# Patient Record
Sex: Male | Born: 1945
Health system: Southern US, Community
[De-identification: ages and names within clinical notes are randomized; demographics above are authoritative.]

## PROBLEM LIST (undated history)

## (undated) DIAGNOSIS — H9190 Unspecified hearing loss, unspecified ear: Secondary | ICD-10-CM

## (undated) DIAGNOSIS — N529 Male erectile dysfunction, unspecified: Secondary | ICD-10-CM

## (undated) DIAGNOSIS — M1990 Unspecified osteoarthritis, unspecified site: Secondary | ICD-10-CM

## (undated) DIAGNOSIS — E785 Hyperlipidemia, unspecified: Secondary | ICD-10-CM

## (undated) DIAGNOSIS — K219 Gastro-esophageal reflux disease without esophagitis: Secondary | ICD-10-CM

## (undated) DIAGNOSIS — J309 Allergic rhinitis, unspecified: Secondary | ICD-10-CM

## (undated) DIAGNOSIS — T464X5A Adverse effect of angiotensin-converting-enzyme inhibitors, initial encounter: Secondary | ICD-10-CM

## (undated) DIAGNOSIS — M722 Plantar fascial fibromatosis: Secondary | ICD-10-CM

## (undated) DIAGNOSIS — C801 Malignant (primary) neoplasm, unspecified: Secondary | ICD-10-CM

## (undated) DIAGNOSIS — R7303 Prediabetes: Secondary | ICD-10-CM

## (undated) DIAGNOSIS — I10 Essential (primary) hypertension: Secondary | ICD-10-CM

## (undated) DIAGNOSIS — C61 Malignant neoplasm of prostate: Secondary | ICD-10-CM

## (undated) DIAGNOSIS — K5900 Constipation, unspecified: Secondary | ICD-10-CM

## (undated) DIAGNOSIS — C3490 Malignant neoplasm of unspecified part of unspecified bronchus or lung: Secondary | ICD-10-CM

## (undated) DIAGNOSIS — R05 Cough: Secondary | ICD-10-CM

## (undated) DIAGNOSIS — I2510 Atherosclerotic heart disease of native coronary artery without angina pectoris: Secondary | ICD-10-CM

## (undated) HISTORY — DX: Adverse effect of angiotensin-converting-enzyme inhibitors, initial encounter: T46.4X5A

## (undated) HISTORY — DX: Plantar fascial fibromatosis: M72.2

## (undated) HISTORY — DX: Unspecified hearing loss, unspecified ear: H91.90

## (undated) HISTORY — PX: COLONOSCOPY: SHX174

## (undated) HISTORY — DX: Gastro-esophageal reflux disease without esophagitis: K21.9

## (undated) HISTORY — DX: Cough: R05

## (undated) HISTORY — DX: Hyperlipidemia, unspecified: E78.5

## (undated) HISTORY — DX: Allergic rhinitis, unspecified: J30.9

## (undated) HISTORY — PX: KNEE ARTHROSCOPY: SHX127

## (undated) HISTORY — DX: Male erectile dysfunction, unspecified: N52.9

---

## 1998-07-09 ENCOUNTER — Ambulatory Visit (HOSPITAL_COMMUNITY): Admission: RE | Admit: 1998-07-09 | Discharge: 1998-07-09 | Payer: Self-pay | Admitting: *Deleted

## 2001-01-23 ENCOUNTER — Emergency Department (HOSPITAL_COMMUNITY): Admission: EM | Admit: 2001-01-23 | Discharge: 2001-01-23 | Payer: Self-pay | Admitting: Emergency Medicine

## 2001-03-23 ENCOUNTER — Encounter: Payer: Self-pay | Admitting: Emergency Medicine

## 2001-03-23 ENCOUNTER — Inpatient Hospital Stay (HOSPITAL_COMMUNITY): Admission: AD | Admit: 2001-03-23 | Discharge: 2001-03-25 | Payer: Self-pay | Admitting: Neurology

## 2001-03-23 ENCOUNTER — Encounter: Payer: Self-pay | Admitting: Pediatrics

## 2001-03-24 ENCOUNTER — Encounter: Payer: Self-pay | Admitting: Neurology

## 2001-10-23 ENCOUNTER — Ambulatory Visit (HOSPITAL_COMMUNITY): Admission: RE | Admit: 2001-10-23 | Discharge: 2001-10-23 | Payer: Self-pay | Admitting: *Deleted

## 2001-10-23 ENCOUNTER — Encounter (INDEPENDENT_AMBULATORY_CARE_PROVIDER_SITE_OTHER): Payer: Self-pay | Admitting: *Deleted

## 2003-12-08 ENCOUNTER — Emergency Department (HOSPITAL_COMMUNITY): Admission: EM | Admit: 2003-12-08 | Discharge: 2003-12-08 | Payer: Self-pay | Admitting: Emergency Medicine

## 2004-12-08 ENCOUNTER — Ambulatory Visit: Payer: Self-pay | Admitting: Family Medicine

## 2005-04-05 ENCOUNTER — Ambulatory Visit: Payer: Self-pay | Admitting: Family Medicine

## 2005-05-12 ENCOUNTER — Ambulatory Visit: Payer: Self-pay | Admitting: Family Medicine

## 2005-05-25 ENCOUNTER — Ambulatory Visit: Payer: Self-pay | Admitting: Family Medicine

## 2005-09-27 ENCOUNTER — Ambulatory Visit: Payer: Self-pay | Admitting: Family Medicine

## 2007-01-30 DIAGNOSIS — Z8679 Personal history of other diseases of the circulatory system: Secondary | ICD-10-CM | POA: Insufficient documentation

## 2007-01-30 DIAGNOSIS — J309 Allergic rhinitis, unspecified: Secondary | ICD-10-CM

## 2007-01-30 HISTORY — DX: Allergic rhinitis, unspecified: J30.9

## 2007-08-09 ENCOUNTER — Telehealth (INDEPENDENT_AMBULATORY_CARE_PROVIDER_SITE_OTHER): Payer: Self-pay

## 2007-09-13 ENCOUNTER — Ambulatory Visit: Payer: Self-pay | Admitting: Family Medicine

## 2007-09-13 LAB — CONVERTED CEMR LAB
ALT: 25 units/L (ref 0–53)
AST: 17 units/L (ref 0–37)
Albumin: 3.8 g/dL (ref 3.5–5.2)
Alkaline Phosphatase: 49 units/L (ref 39–117)
BUN: 10 mg/dL (ref 6–23)
Basophils Absolute: 0 10*3/uL (ref 0.0–0.1)
Basophils Relative: 0.2 % (ref 0.0–1.0)
Bilirubin Urine: NEGATIVE
Bilirubin, Direct: 0.1 mg/dL (ref 0.0–0.3)
CO2: 30 meq/L (ref 19–32)
Calcium: 9.3 mg/dL (ref 8.4–10.5)
Chloride: 106 meq/L (ref 96–112)
Cholesterol: 160 mg/dL (ref 0–200)
Creatinine, Ser: 1.1 mg/dL (ref 0.4–1.5)
Eosinophils Absolute: 0.8 10*3/uL — ABNORMAL HIGH (ref 0.0–0.7)
Eosinophils Relative: 9.6 % — ABNORMAL HIGH (ref 0.0–5.0)
GFR calc Af Amer: 87 mL/min
GFR calc non Af Amer: 72 mL/min
Glucose, Bld: 76 mg/dL (ref 70–99)
Glucose, Urine, Semiquant: NEGATIVE
HCT: 47.1 % (ref 39.0–52.0)
HDL: 39.1 mg/dL (ref >39.0)
Hemoglobin: 15.7 g/dL (ref 13.0–17.0)
Ketones, urine, test strip: NEGATIVE
LDL Cholesterol: 104 mg/dL — ABNORMAL HIGH (ref 0–99)
Lymphocytes Relative: 30 % (ref 12.0–46.0)
MCHC: 33.3 g/dL (ref 30.0–36.0)
MCV: 84.4 fL (ref 78.0–100.0)
Monocytes Absolute: 0.7 10*3/uL (ref 0.1–1.0)
Monocytes Relative: 9.2 % (ref 3.0–12.0)
Neutro Abs: 4 10*3/uL (ref 1.4–7.7)
Neutrophils Relative %: 51 % (ref 43.0–77.0)
Nitrite: NEGATIVE
PSA: 2.49 ng/mL (ref 0.10–4.00)
Platelets: 243 10*3/uL (ref 150–400)
Potassium: 4.4 meq/L (ref 3.5–5.1)
Protein, U semiquant: NEGATIVE
RBC: 5.58 M/uL (ref 4.22–5.81)
RDW: 13.2 % (ref 11.5–14.6)
Sodium: 140 meq/L (ref 135–145)
Specific Gravity, Urine: 1.02
TSH: 1 microintl units/mL (ref 0.35–5.50)
Total Bilirubin: 0.9 mg/dL (ref 0.3–1.2)
Total CHOL/HDL Ratio: 4.1
Total Protein: 6.8 g/dL (ref 6.0–8.3)
Triglycerides: 87 mg/dL (ref 0–149)
Urobilinogen, UA: 0.2
VLDL: 17 mg/dL (ref 0–40)
WBC Urine, dipstick: NEGATIVE
WBC: 7.9 10*3/uL (ref 4.5–10.5)
pH: 5.5

## 2007-09-26 ENCOUNTER — Ambulatory Visit: Payer: Self-pay | Admitting: Family Medicine

## 2007-09-26 DIAGNOSIS — E785 Hyperlipidemia, unspecified: Secondary | ICD-10-CM

## 2007-09-26 DIAGNOSIS — K219 Gastro-esophageal reflux disease without esophagitis: Secondary | ICD-10-CM

## 2007-09-26 HISTORY — DX: Gastro-esophageal reflux disease without esophagitis: K21.9

## 2007-09-26 HISTORY — DX: Hyperlipidemia, unspecified: E78.5

## 2007-10-09 ENCOUNTER — Telehealth: Payer: Self-pay | Admitting: Family Medicine

## 2007-10-10 ENCOUNTER — Encounter: Payer: Self-pay | Admitting: Family Medicine

## 2007-11-27 ENCOUNTER — Telehealth: Payer: Self-pay | Admitting: Family Medicine

## 2008-09-25 ENCOUNTER — Ambulatory Visit: Payer: Self-pay | Admitting: Family Medicine

## 2008-09-25 LAB — CONVERTED CEMR LAB
AST: 23 units/L (ref 0–37)
Albumin: 3.9 g/dL (ref 3.5–5.2)
BUN: 11 mg/dL (ref 6–23)
Basophils Absolute: 0 10*3/uL (ref 0.0–0.1)
Bilirubin Urine: NEGATIVE
Blood in Urine, dipstick: NEGATIVE
CO2: 30 meq/L (ref 19–32)
Chloride: 109 meq/L (ref 96–112)
Cholesterol: 145 mg/dL (ref 0–200)
Glucose, Bld: 90 mg/dL (ref 70–99)
Glucose, Urine, Semiquant: NEGATIVE
HCT: 46.7 % (ref 39.0–52.0)
Hemoglobin: 15.8 g/dL (ref 13.0–17.0)
Ketones, urine, test strip: NEGATIVE
Lymphs Abs: 2.1 10*3/uL (ref 0.7–4.0)
MCHC: 33.8 g/dL (ref 30.0–36.0)
MCV: 83.7 fL (ref 78.0–100.0)
Monocytes Absolute: 0.6 10*3/uL (ref 0.1–1.0)
Monocytes Relative: 8.5 % (ref 3.0–12.0)
Neutro Abs: 3.7 10*3/uL (ref 1.4–7.7)
Nitrite: NEGATIVE
PSA: 3.46 ng/mL (ref 0.10–4.00)
Platelets: 211 10*3/uL (ref 150.0–400.0)
Potassium: 4.4 meq/L (ref 3.5–5.1)
Protein, U semiquant: NEGATIVE
RDW: 12.5 % (ref 11.5–14.6)
Sodium: 143 meq/L (ref 135–145)
Specific Gravity, Urine: <1.005
TSH: 1.16 microintl units/mL (ref 0.35–5.50)
Total Bilirubin: 1.1 mg/dL (ref 0.3–1.2)
Urobilinogen, UA: 0.2
VLDL: 12.4 mg/dL (ref 0.0–40.0)
WBC Urine, dipstick: NEGATIVE
pH: 6

## 2008-10-03 ENCOUNTER — Ambulatory Visit: Payer: Self-pay | Admitting: Family Medicine

## 2009-03-12 ENCOUNTER — Ambulatory Visit: Payer: Self-pay | Admitting: Family Medicine

## 2009-06-16 ENCOUNTER — Telehealth: Payer: Self-pay | Admitting: Family Medicine

## 2009-06-16 DIAGNOSIS — H9190 Unspecified hearing loss, unspecified ear: Secondary | ICD-10-CM | POA: Insufficient documentation

## 2009-06-16 HISTORY — DX: Unspecified hearing loss, unspecified ear: H91.90

## 2009-07-08 ENCOUNTER — Ambulatory Visit: Payer: Self-pay | Admitting: Family Medicine

## 2009-07-08 DIAGNOSIS — M722 Plantar fascial fibromatosis: Secondary | ICD-10-CM

## 2009-07-08 HISTORY — DX: Plantar fascial fibromatosis: M72.2

## 2010-05-29 ENCOUNTER — Telehealth: Payer: Self-pay | Admitting: Family Medicine

## 2010-06-23 NOTE — Progress Notes (Signed)
Summary: needs referral  Phone Note Call from Patient Call back at [phone]   Caller: Patient- live call Summary of Call: pt is to have a hearing test at [facility] in [facility]. His insurance requires a referral. Please fax to [phone]. Appt is this Thursday at 3 pm. Initial call taken by: Warnell Forester,  June 16, 2009 10:30 AM  Follow-up for Phone Call        ok Follow-up by: Roderick Pee MD,  June 16, 2009 11:26 AM  New Problems: UNSPECIFIED HEARING LOSS (ICD-389.9)   New Problems: UNSPECIFIED HEARING LOSS (ICD-389.9)

## 2010-06-23 NOTE — Assessment & Plan Note (Signed)
Summary: consult re: foot pain/cjr   Vital Signs:  Patient profile:   65 year old male Height:      66 inches Weight:      199 pounds BMI:     32.24 Temp:     98.6 degrees F oral BP sitting:   120 / 86  (left arm) Cuff size:   regular  Vitals Entered By: Kern Reap CMA Duncan Dull) (July 08, 2009 2:47 PM)  Reason for Visit feet pain  History of Present Illness: Carl Harrell  is a 65 year old male who comes in with a 41-month history of pain in both heels.  Last summer.  He was walking 13-mile walk.  After that.  He does pain in both heels.  It will go away.  He feels is worse in the morning, when he gets out of bed.  He continues to go barefooted a lot at home.  No history of trauma  Allergies: No Known Drug Allergies  Past History:  Past medical, surgical, family and social histories (including risk factors) reviewed for relevance to current acute and chronic problems.  Past Medical History: Reviewed history from 09/26/2007 and no changes required. PVD ED Allergic rhinitis High Cholesterol Transient ischemic attack, hx of left knee scoped 2008 .cartilage GERD Hyperlipidemia  Past Surgical History: Reviewed history from 01/30/2007 and no changes required. Colonoscopy x2  Family History: Reviewed history from 01/30/2007 and no changes required. Family History Breast cancer 1st degree relative <50 Family History High cholesterol Family History of Cardiovascular disorder Family History of Respiratory disease  Social History: Reviewed history from 01/30/2007 and no changes required. Occupation: Married Former Smoker Alcohol use-yes Drug use-no  Review of Systems      See HPI  Physical Exam  Msk:  both feet are normal except for some tenderness in both heels, consistent with plantar fasciitis   Impression & Recommendations:  Problem # 1:  PLANTAR FASCIITIS, BILATERAL (ICD-728.71) Assessment New  Complete Medication List: 1)  Zocor 40 Mg Tabs (Simvastatin)  .... Once daily 2)  Nexium 40 Mg Cpdr (Esomeprazole magnesium) .... Two times a day 3)  Allegra 180 Mg Tabs (Fexofenadine hcl) .... Take 1 tablet by mouth every morning 4)  Prednisone 10 Mg Tabs (Prednisone) .... Taper as follows: 4-4-4-3-3-3-2-2-1-1  Patient Instructions: 1)  begin Motrin 600 mg twice a day with food.  Do the stretching exercises in the morning before you get out of bed. 2)  Do not go barefooted, consider the special shoes...sanita......... if you don't see any improvement after two to 4 weeks.  Call we  will  set u  up to see Jeanene Erb, physical therapist

## 2010-06-25 NOTE — Progress Notes (Signed)
Summary: wants referral  Phone Note Call from Patient Call back at [phone]   Caller: Patient---live call Summary of Call: heels are not better. he wants to go ahead and see Jeanene Erb, specialist. please order. Initial call taken by: Warnell Forester,  May 29, 2010 9:11 AM  Follow-up for Phone Call        ok ........connie dupree....Marland Kitchen physical therapist evaluation and treatment of plantar fasciitis Follow-up by: Roderick Pee MD,  May 29, 2010 5:38 PM

## 2010-07-08 ENCOUNTER — Other Ambulatory Visit: Payer: Self-pay | Admitting: Family Medicine

## 2010-08-18 ENCOUNTER — Encounter: Payer: Self-pay | Admitting: Family Medicine

## 2010-08-18 ENCOUNTER — Ambulatory Visit (INDEPENDENT_AMBULATORY_CARE_PROVIDER_SITE_OTHER): Payer: Medicare Other | Admitting: Family Medicine

## 2010-08-18 DIAGNOSIS — N401 Enlarged prostate with lower urinary tract symptoms: Secondary | ICD-10-CM

## 2010-08-18 DIAGNOSIS — Z8679 Personal history of other diseases of the circulatory system: Secondary | ICD-10-CM

## 2010-08-18 DIAGNOSIS — N138 Other obstructive and reflux uropathy: Secondary | ICD-10-CM

## 2010-08-18 DIAGNOSIS — K219 Gastro-esophageal reflux disease without esophagitis: Secondary | ICD-10-CM

## 2010-08-18 DIAGNOSIS — E785 Hyperlipidemia, unspecified: Secondary | ICD-10-CM

## 2010-08-18 LAB — PSA: PSA: 3.47 ng/mL (ref 0.10–4.00)

## 2010-08-18 LAB — LIPID PANEL
Cholesterol: 154 mg/dL (ref 0–200)
Triglycerides: 77 mg/dL (ref 0.0–149.0)

## 2010-08-18 LAB — HEPATIC FUNCTION PANEL
ALT: 30 U/L (ref 0–53)
AST: 23 U/L (ref 0–37)
Albumin: 4 g/dL (ref 3.5–5.2)
Total Bilirubin: 0.8 mg/dL (ref 0.3–1.2)
Total Protein: 6.5 g/dL (ref 6.0–8.3)

## 2010-08-18 LAB — CBC WITH DIFFERENTIAL/PLATELET
Basophils Relative: 0.4 % (ref 0.0–3.0)
Eosinophils Relative: 5 % (ref 0.0–5.0)
HCT: 43.4 % (ref 39.0–52.0)
Hemoglobin: 14.6 g/dL (ref 13.0–17.0)
Lymphs Abs: 2.4 10*3/uL (ref 0.7–4.0)
MCV: 84.3 fl (ref 78.0–100.0)
Monocytes Absolute: 0.7 10*3/uL (ref 0.1–1.0)
Neutro Abs: 5 10*3/uL (ref 1.4–7.7)
Platelets: 233 10*3/uL (ref 150.0–400.0)
WBC: 8.6 10*3/uL (ref 4.5–10.5)

## 2010-08-18 LAB — POCT URINALYSIS DIPSTICK
Bilirubin, UA: NEGATIVE
Blood, UA: NEGATIVE
Glucose, UA: NEGATIVE
Ketones, UA: NEGATIVE
Spec Grav, UA: 1.02

## 2010-08-18 LAB — BASIC METABOLIC PANEL
BUN: 14 mg/dL (ref 6–23)
Chloride: 107 mEq/L (ref 96–112)
Potassium: 4.5 mEq/L (ref 3.5–5.1)
Sodium: 142 mEq/L (ref 135–145)

## 2010-08-18 LAB — TSH: TSH: 1.35 u[IU]/mL (ref 0.35–5.50)

## 2010-08-18 MED ORDER — ESOMEPRAZOLE MAGNESIUM 40 MG PO CPDR
40.0000 mg | DELAYED_RELEASE_CAPSULE | Freq: Two times a day (BID) | ORAL | Status: DC
Start: 2010-08-18 — End: 2012-02-01

## 2010-08-18 MED ORDER — SIMVASTATIN 40 MG PO TABS: 40.0000 mg | ORAL_TABLET | Freq: Every day | ORAL | Status: DC

## 2010-08-18 NOTE — Patient Instructions (Signed)
Continue your current medications.  Remember to avoid anything that has pseudoephedrine or caffeine in it.  Consider the over-the-counter Prilosec 40 mg twice daily instead of the Nexium.  Follow-up in one year sooner if any problems.  Check with your insurance company to see what the date for the shingles.  Vaccine and if you would like to vaccine.  Call our main number and leave a voicemail with Fleet Contras my nurse

## 2010-08-18 NOTE — Progress Notes (Signed)
  Subjective:    Patient ID: Carl Harrell, male    DOB: 05/09/1946, 65 y.o.   MRN: [id number]  HPI Carl Harrell is a 65 year old, married man nonsmoker, who comes in today for evaluation of allergic rhinitis and hyperlipidemia.  He takes plain Allegra for allergic rhinitis and a daily basis.  He also takes Nexium 40 mg b.i.d. Because of reflux esophagitis.  Last colonoscopy two years ago, normal.  He also takes Zocor 40 mg nightly lipid panel today.  He does have BPH with outlet obstruction and some mild erectile dysfunction.   Review of Systems  Constitutional: Negative.   HENT: Negative.   Eyes: Negative.   Respiratory: Negative.   Cardiovascular: Negative.   Gastrointestinal: Negative.   Genitourinary: Negative.   Musculoskeletal: Negative.   Skin: Negative.   Neurological: Negative.   Hematological: Negative.   Psychiatric/Behavioral: Negative.        Objective:   Physical Exam  Constitutional: He is oriented to person, place, and time. He appears well-developed and well-nourished.  HENT:  Head: Normocephalic and atraumatic.  Right Ear: External ear normal.  Left Ear: External ear normal.  Nose: Nose normal.  Mouth/Throat: Oropharynx is clear and moist.  Eyes: Conjunctivae and EOM are normal. Pupils are equal, round, and reactive to light.  Neck: Normal range of motion. Neck supple. No JVD present. No tracheal deviation present. No thyromegaly present.  Cardiovascular: Normal rate, regular rhythm, normal heart sounds and intact distal pulses.  Exam reveals no gallop and no friction rub.   No murmur heard. Pulmonary/Chest: Effort normal and breath sounds normal. No stridor. No respiratory distress. He has no wheezes. He has no rales. He exhibits no tenderness.  Abdominal: Soft. Bowel sounds are normal. He exhibits no distension and no mass. There is no tenderness. There is no rebound and no guarding.  Genitourinary: Rectum normal and penis normal. Guaiac negative stool. No  penile tenderness.       2+ and symmetrical.  BPH  Musculoskeletal: Normal range of motion. He exhibits no edema and no tenderness.  Lymphadenopathy:    He has no cervical adenopathy.  Neurological: He is alert and oriented to person, place, and time. He has normal reflexes. No cranial nerve deficit. He exhibits normal muscle tone.  Skin: Skin is warm and dry. No rash noted. No erythema. No pallor.  Psychiatric: He has a normal mood and affect. His behavior is normal. Judgment and thought content normal.          Assessment & Plan:  Allergic rhinitis....... Continue Allegra one daily.  Reflux esophagitis.......... Continue Nexium 40 mg b.i.d.  Hyperlipidemia........... Continue Zocor 40 mg nightly  BPH with outlet obstruction.............Marland Kitchen Avoid caffeine.  Erectile dysfunction..........Marland Kitchen Declines medication

## 2010-10-09 NOTE — H&P (Signed)
[facility]  Patient:    Carl Harrell Visit Number: [id number] MRN: [id number]          Service Type: MED Location: 3000 [id number] 01 Attending Physician:  Erich Montane Dictated by:   Deanna Artis. Sharene Skeans, M.D. Admit Date:  03/23/2001   CC:         Carl Harrell, M.D.   History and Physical  DATE OF BIRTH:  [birth year]  CHIEF COMPLAINT:  Right leg gave way.  HISTORY OF PRESENT ILLNESS:  Carl Harrell is a 65 year old married, right-handed, Caucasian gentleman from [place], [place].  He had onset of blurred vision in his left eye yesterday afternoon which persists and may be somewhat better.  The patient has been ill for a week with a flulike illness involving cough, malaise, anorexia, without significant fever.  [facility] and placed on a Z-Pak.  He still went to work. While driving there around 4 this morning he felt light-headed.  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  He was unable to move it at all and collapsed to the loading dock.  The patients symptoms of right leg weakness lasted for about 20 minutes.  The patient has had a three-hour left temporal headache last night.  He denies other pain or numbness in association with this.  He has not had diplopia, dysphagia, true loss of consciousness, or incontinence.  He thought that his speech was slurred this morning, but this was not observed by his wife.  REVIEW OF SYSTEMS:  Remarkable for hyperlipidemia.  He has increased blood pressure in the emergency room which appears to be new.  No history of diabetes mellitus or personal history of heart disease.  The patient has a lump in his back which looks like a prominent right scapula.  This is accented by a small area of atrophy superior to that.  The patient had a prior history of right sciatica which resolved and also  bursitis in his right shoulder which resolved.  Review of systems is otherwise negative.  PAST MEDICAL HISTORY:  Negative except as noted in the review of systems.  PAST SURGICAL HISTORY:  The patient had removal of colonic polyps via colonoscopy.  No cancer was found.  MEDICATIONS: 1. Lipitor 20 mg per day. 2. Z-Pak.  ALLERGIES:  None.  FAMILY HISTORY:  Positive for atherosclerotic cardiovascular disease. According to his wife, only one male in his family has lived past age 29.  His father died in his 68s of an acute myocardial infarction.  The patients mother is in her 40s.  She has breast cancer and survives in complete remission.  He has two sisters who are alive and well.  SOCIAL HISTORY:  The patient works delivering bread.  He has a remote history of smoking that was infrequent.  He does not use alcohol except on rare occasions.  PHYSICAL EXAMINATION:  VITAL SIGNS:  On examination today, blood pressure 138/84 (it has been as high as 164/93 witnessed by me).  Pulse 78 and regular, respirations 16, temperature 98.5.  HEENT:  No signs of infection in the tympanic membranes, oropharynx, nasal pharynx.  NECK:  Supple neck, full range of motion.  No cranial or cervical bruits.  LUNGS:  Clear to auscultation.  HEART:  No murmurs.  Pulses normal.  ABDOMEN:  Soft.  Bowel sounds normal.  No hepatosplenomegaly.  EXTREMITIES:  Negative with no edema or cyanosis.  NEUROLOGIC:  The patient shows a left pupillary defect and some venous engorgement in the left retina.  No papilledema or hemorrhage.  Visual acuity 20/40 in both eyes with glasses.  The patient had symmetric facial strength, midline tongue and uvula.  Mental Status:  The patient was awake, alert, attentive, appropriate, no dysphagia or dyspraxia.  Motor examination:  Normal strength, tone, and mass.  Good fine motor movements.  No pronator drift.  He did not show winging of his right scapula.  Sensory  examination:  Stocking neuropathy to cold to his ankles, otherwise distally, he had normal vibration and proprioception.  He had good stereognosis and a 4 mm two point discrimination.  Cerebellar examination:  Finger-to-nose rapid fine motor movements were normal.  Gait was steady and neuro based.  He can walk on his heels and toes. Deep tendon reflexes were brisk at the knees, normal at the ankles, diminished to absent in the upper extremities.  He had bilateral flexor plantar responses.  IMPRESSION: 1. Left afferent pupillary defect. 2. Transient right leg weakness.  This suggests anterior circulation of    vaso-occlusive disease. 3. Presyncope, etiology unknown, though I think the patient had been ill. 4. Risk factors for stroke include dyslipidemia, which is being treated, mild    hypertension, and a family history of atherosclerotic cardiovascular    disease.  PLAN:  Will hold heparin for now because the patient is stable in all aspects. We will work and evaluate him with MRI scan of the brain, MRA, intracranial, extracranial, and 2-D echocardiogram.  He will also have a workup for treatable causes of stroke in the young.  Will treat him with enteric-coated aspirin and continue with Lipitor and any other medications that seem appropriate.  If a bed opens up in the Stroke Center at [facility], he will be transferred to [facility] and placed on the stroke team, otherwise, he will be admitted to [facility] under my care. Dictated by:   Deanna Artis. Sharene Skeans, M.D. Attending Physician:  Erich Montane DD:  03/23/01 TD:  03/23/01 Job: [id number]

## 2010-10-09 NOTE — Discharge Summary (Signed)
[place]  Patient:    Carl Harrell, Carl Harrell Visit Number: [id number] MRN: [id number]          Service Type: MED Location: 3000 3039 01 Attending Physician:  Erich Montane Dictated by:   Genene Churn. Love, M.D. Admit Date:  03/23/2001 Discharge Date: 03/25/2001   CC:         [facility] on [address]  Peter M. [place], M.D.   Discharge Summary  STREET ADDRESS:  [address], [place], [place] [address].  DATE OF BIRTH:  02/24/1946  Carl Harrell is a 65 year old right-handed white married male who lives near [place], [place], and was admitted from [facility] Emergency Room in transfer to [facility] Stroke Unit on 03/23/01, for evaluation of a history of viral illness, visual loss in his left eye, and transient right leg weakness.  HISTORY OF PRESENT ILLNESS:  [name] had been [facility] [address] in [place] with a one week history of flu-like illness characterized by cough, malaise, and anorexia.  He was placed on a Z-Pak antibiotic medications for his suspected viral illness.  He still went to work.  He noted a history of left eye visual loss on 03/22/01, and awoke in the middle of the night with continued visual loss, though it had improved. 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  That night prior to that time he had some left temporal headache.  There was no other history of symptoms of chest pain, double vision, swallowing problems, slurred speech, blackout spells, or seizures.  He has no known history of drug or alcohol abuse.  He has a remote history of cigarette use.  He has a positive history of hypercholesterolemia. There is no known history of diabetes, heart disease, or peripheral vascular disease.  PAST MEDICAL  HISTORY: 1. Prior history of right leg sciatica which resolved. 2. History of bursitis in his right shoulder.  PAST SURGICAL HISTORY:  Removal of polyps via colonoscopy without evidence of cancer noted.  MEDICATIONS: 1. Lipitor 20 mg q.d. 2. Z-Pak on admission.  In the past he had been taking aspirin, but when he began Lipitor he discontinued the aspirin.  ALLERGIES:  No known drug allergies.  FAMILY HISTORY:  Positive in that his father died in his 67s with a myocardial infarction.  His mother was in her 20s, breast cancer survivor.  He has two sisters who are alive and well.  SOCIAL HISTORY:  He rarely drank alcohol.  He works by delivering bread.  He has a remote smoking history.  PHYSICAL EXAMINATION:  VITAL SIGNS:  Blood pressure 138/84, but had been as high as 164/93 in the emergency room.  Heart rate was 78 and regular.  Telemetry showed normal sinus rhythm.  Temperature was 98.5.  GENERAL:  Unremarkable.  Specifically, there were no bruits and there was no heart murmur.  NEUROLOGIC EXAMINATION:  He was alert and oriented x 3.  He would follow three step commands.  Initially on admission, it was felt that he had a left afferent pupillary defect with some venous engorgement, but no definite evidence of papal edema and no optic atrophy present.  His visual acuity was 20/40 bilaterally with glasses.  His face was symmetric.  His tongue was midline, the uvula was  midline, and gags were present.  His motor examination revealed good strength in the upper and lower extremities.  His coordination testing was normal.  Sensory examination was intact.  Deep tendon reflexes were 2+ and equal.  Plantar responses were bilaterally downgoing.  LABORATORY DATA:  White blood cell count 10,400, hemoglobin 13.6, hematocrit 40.6, platelet count 234,000.  PT 13.3, INR 1.0, PTT was 41, which is slightly high.  The antithrombin III level was 70, which was low, but a repeat was 86, in the  normal range.  Glucose 138, non-fasting.  Sodium 137, potassium 3.6, chloride 105, CO2 content 27, BUN 9, creatinine 1.0, calcium 8.5, total protein 5.9, albumin 3.0, AST 20, ALT 28, ALP 68, total bilirubin 0.6.  Lipid profile revealed a cholesterol of 148, triglycerides 91, HDL 36, LDL 94.  His initial antithrombin III was 70, but then repeat was 86.  His anticardiolipin antibody revealed an IgA of less than 14, IgG of less than 12 which were normal.  The IgM was 62, which normals would be less than 11.  Protein S was 140 with normals of 81 to 180.  Protein C was 106 with normal of 91 to 147. Erythrocyte sedimentation rate was 9.  His chest x-ray showed no evidence of acute disease.  A 12 lead EKG showed normal sinus rhythm with a normal EKG. Preliminary report on 2-D echocardiogram obtained on 03/24/01, showed adequate images, thickened proximal septum, trivial tricuspid regurgitation, no obvious source of emboli, and trivial mitral regurgitation noted.  The Doppler study of the carotids revealed no evidence of significant internal carotid artery stenosis and vertebral flow was antegrade.  Telemetry showed normal sinus rhythm.  Urinalysis was unremarkable.  MRI study of the brain obtained the day of admission at [facility] showed normal MRI study of the brain with and without contrast enhancement, including diffusion weighted imaging, and imaging after the use of intravenous contrast enhancement.  The MRA performed at [facility] intracranial study and extracranial study did reveal a great deal of movement, and because of this the MRA was repeated which showed no significant abnormalities.  It was of note that the right common carotid artery was widely patent.  There was no stenosis of the internal or external carotid artery seen.  Both internal carotid arteries were widely patent in the brain.  There is no evidence of aneurysm or vascular malformation present.  There  was no proximal stenosis present.  Both vertebral arteries were patent. The basal artery was of normal caliber.  Both posterior cerebral arteries were patent, though there was a fetal origin at the left posterior cerebral artery. This was a negative intracranial MRA.  There was also a negative MR angiography of the neck vessels.  HOSPITAL COURSE:  At the time of admission the patients symptoms had essentially resolved.  There was some history to suggests the possibility of migraines since he had a history of car sickness as a child while riding in a car.  It was decided that he possibly had a form of transient ischemic attack. His ophthalmologist, Dr. Dione Booze, was not in town to see the patient. Neurologic examination in the hospital was normal with visual acuities documented in the 20/30 range bilaterally.  IMPRESSION: 1. Left eye visual loss, code 368.8, transient. 2. Right leg weakness, transient, code 344.31. 3. Possible transient ischemic attack, code 435.9. 4. History of hyperlipidemia, code 372.4. 5. Recent viral illness.  PLAN: 1. Place the patient on aspirin 325 mg q.d., as  well as Lipitor 20 mg q.d. 2. I asked him not to drive a car for 2 to 3 weeks.  CONDITION ON DISCHARGE:  He is discharged improved from pre-hospital status. It is possible that a repeat anticardiolipin IgM may be performed as an outpatient since this was the only study to date that had been positive. Dictated by:   Genene Churn. Love, M.D. Attending Physician:  Erich Montane DD:  03/25/01 TD:  03/27/01 Job: [id number]

## 2011-02-15 ENCOUNTER — Encounter: Payer: Self-pay | Admitting: Internal Medicine

## 2011-02-15 ENCOUNTER — Ambulatory Visit (INDEPENDENT_AMBULATORY_CARE_PROVIDER_SITE_OTHER): Payer: Medicare Other | Admitting: Internal Medicine

## 2011-02-15 VITALS — BP 112/70 | Temp 98.2°F | Ht 67.0 in | Wt 203.0 lb

## 2011-02-15 DIAGNOSIS — L089 Local infection of the skin and subcutaneous tissue, unspecified: Secondary | ICD-10-CM

## 2011-02-15 DIAGNOSIS — L723 Sebaceous cyst: Secondary | ICD-10-CM

## 2011-02-15 MED ORDER — AMOXICILLIN-POT CLAVULANATE 875-125 MG PO TABS: 1.0000 | ORAL_TABLET | Freq: Two times a day (BID) | ORAL | Status: AC

## 2011-02-15 NOTE — Patient Instructions (Signed)
Take your antibiotic as prescribed until ALL of it is gone, but stop if you develop a rash, swelling, or any side effects of the medication.  Contact our office as soon as possible if  there are side effects of the medication.  Call for followup office visit if the area enlarges or causes more discomfort  Warm compresses to the left axilla 4 times daily

## 2011-02-15 NOTE — Progress Notes (Signed)
  Subjective:    Patient ID: MASOUD NYCE, male    DOB: 23-Jun-1945, 65 y.o.   MRN: [id number]  HPI  65 year old patient who is seen today with a five-day history of a painful nodule in the left axilla. He was concerned about also will spider or insect bite to this area. He's had no fever or constitutional complaints.    Review of Systems  Constitutional: Negative for fever, chills and fatigue.  Skin: Positive for wound.       Objective:   Physical Exam  Constitutional: He appears well-developed and well-nourished. No distress.       Temperature 98.2  Skin:       A 2 x 3 cm firm indurated nodule slightly erythematous without fluctuance noted in the left axillary area. Nodule was tender and non-draining          Assessment & Plan:   Infected sebaceous cyst. There is no fluctuance and I believe the yield for I&D would be quite low. Will place on antibiotic therapy and warm compresses. He is aware that he may require I&D over the next 2 or 3 days if he fails to improve

## 2011-03-03 ENCOUNTER — Ambulatory Visit (INDEPENDENT_AMBULATORY_CARE_PROVIDER_SITE_OTHER): Payer: Medicare Other | Admitting: Family Medicine

## 2011-03-03 ENCOUNTER — Encounter: Payer: Self-pay | Admitting: Family Medicine

## 2011-03-03 VITALS — BP 110/70 | Temp 98.6°F | Wt 204.0 lb

## 2011-03-03 DIAGNOSIS — Z23 Encounter for immunization: Secondary | ICD-10-CM

## 2011-03-03 DIAGNOSIS — L738 Other specified follicular disorders: Secondary | ICD-10-CM

## 2011-03-03 DIAGNOSIS — L739 Follicular disorder, unspecified: Secondary | ICD-10-CM

## 2011-03-03 MED ORDER — SULFAMETHOXAZOLE-TRIMETHOPRIM 800-160 MG PO TABS: 1.0000 | ORAL_TABLET | Freq: Two times a day (BID) | ORAL | Status: AC

## 2011-03-03 MED ORDER — DOXYCYCLINE HYCLATE 100 MG PO TABS: 100.0000 mg | ORAL_TABLET | Freq: Two times a day (BID) | ORAL | Status: AC

## 2011-03-03 NOTE — Progress Notes (Signed)
  Subjective:    Patient ID: Carl Harrell, male    DOB: 1946-03-11, 65 y.o.   MRN: [id number]  HPI  Carl Harrell is a 65 year old male, who comes in today for evaluation of a boil and folliculitis in his left axillary area.  He was seen here couple weeks ago was put on an antibiotic for a week.  The boil, diminished in size, but never went away.  Now it's back and he has 4 other lesions  Review of Systems    General an infectious review of systems otherwise negative Objective:   Physical Exam  Well-developed well-nourished man in no acute distress.  Examination, axillary, or shows a marble sized boil, and 4.  Other small folliculi that are red, swollen, hot      Assessment & Plan:  Folliculitis left axillary area.  Plan warm soaks, Septra, and doxy b.i.d. To rash gone

## 2011-03-03 NOTE — Progress Notes (Signed)
Addended by: Azucena Freed on: 03/03/2011 11:31 AM   Modules accepted: Orders

## 2011-03-03 NOTE — Patient Instructions (Signed)
Begin the doxycycline and Septra, one of each twice daily until all the lesions are completely healed.  Warm soaks as often as possible

## 2011-09-30 LAB — HM COLONOSCOPY

## 2011-10-15 ENCOUNTER — Ambulatory Visit (INDEPENDENT_AMBULATORY_CARE_PROVIDER_SITE_OTHER): Payer: Medicare Other | Admitting: Family Medicine

## 2011-10-15 ENCOUNTER — Encounter: Payer: Self-pay | Admitting: Family Medicine

## 2011-10-15 VITALS — BP 130/83 | Temp 97.7°F | Wt 207.0 lb

## 2011-10-15 DIAGNOSIS — L259 Unspecified contact dermatitis, unspecified cause: Secondary | ICD-10-CM

## 2011-10-15 DIAGNOSIS — E785 Hyperlipidemia, unspecified: Secondary | ICD-10-CM

## 2011-10-15 MED ORDER — PREDNISONE 10 MG PO TABS: ORAL_TABLET | ORAL | Status: DC

## 2011-10-15 MED ORDER — METHYLPREDNISOLONE ACETATE 80 MG/ML IJ SUSP
80.0000 mg | Freq: Once | INTRAMUSCULAR | Status: AC
  Administered 2011-10-15: 80 mg via INTRAMUSCULAR

## 2011-10-15 MED ORDER — SIMVASTATIN 40 MG PO TABS: 40.0000 mg | ORAL_TABLET | Freq: Every day | ORAL | Status: DC

## 2011-10-15 NOTE — Patient Instructions (Signed)
Poison Ivy Poison ivy is a inflammation of the skin (contact dermatitis) caused by touching the allergens on the leaves of the ivy plant following previous exposure to the plant. The rash usually appears 48 hours after exposure. The rash is usually bumps (papules) or blisters (vesicles) in a linear pattern. Depending on your own sensitivity, the rash may simply cause redness and itching, or it may also progress to blisters which may break open. These must be well cared for to prevent secondary bacterial (germ) infection, followed by scarring. Keep any open areas dry, clean, dressed, and covered with an antibacterial ointment if needed. The eyes may also get puffy. The puffiness is worst in the morning and gets better as the day progresses. This dermatitis usually heals without scarring, within 2 to 3 weeks without treatment. HOME CARE INSTRUCTIONS  Thoroughly wash with soap and water as soon as you have been exposed to poison ivy. You have about one half hour to remove the plant resin before it will cause the rash. This washing will destroy the oil or antigen on the skin that is causing, or will cause, the rash. Be sure to wash under your fingernails as any plant resin there will continue to spread the rash. Do not rub skin vigorously when washing affected area. Poison ivy cannot spread if no oil from the plant remains on your body. A rash that has progressed to weeping sores will not spread the rash unless you have not washed thoroughly. It is also important to wash any clothes you have been wearing as these may carry active allergens. The rash will return if you wear the unwashed clothing, even several days later. Avoidance of the plant in the future is the best measure. Poison ivy plant can be recognized by the number of leaves. Generally, poison ivy has three leaves with flowering branches on a single stem. Diphenhydramine may be purchased over the counter and used as needed for itching. Do not drive with  this medication if it makes you drowsy.Ask your caregiver about medication for children. SEEK MEDICAL CARE IF:  Open sores develop.   Redness spreads beyond area of rash.   You notice purulent (pus-like) discharge.   You have increased pain.   Other signs of infection develop (such as fever).  Document Released: 05/07/2000 Document Revised: 04/29/2011 Document Reviewed: 03/26/2009 [employer] Patient Information 2012 [employer]. 

## 2011-10-15 NOTE — Progress Notes (Signed)
  Subjective:    Patient ID: Carl Harrell, male    DOB: 1946/03/19, 66 y.o.   MRN: [id number]  HPI  Pruritic rash. Onset 2 days ago. Location is face and upper extremities as well as some lower extremities. Has done lots of outdoor yard work recently. History of contact dermatitis with similar rash the past. No fever or chills. No alleviating factors. Has required prednisone in the past.  Rash is exacerbated by heat.  Patient also requesting refill simvastatin. He is scheduling physical for later this summer.   Review of Systems  Constitutional: Negative for fever and chills.  Skin: Positive for rash.       Objective:   Physical Exam  Constitutional: He appears well-developed and well-nourished.  Cardiovascular: Normal rate and regular rhythm.   Pulmonary/Chest: Effort normal and breath sounds normal. No respiratory distress. He has no wheezes. He has no rales.  Skin:       Patient has diffuse erythematous slightly raised rash with some swelling of the left side of the face and several patches upper extremities and lower extremities. No pustules. Nontender          Assessment & Plan:  Contact dermatitis with widespread distribution. Depo-Medrol 80 mg IM. Oral prednisone taper. Followup as needed

## 2011-11-04 ENCOUNTER — Telehealth: Payer: Self-pay | Admitting: Family Medicine

## 2011-11-04 MED ORDER — PREDNISONE 20 MG PO TABS: 20.0000 mg | ORAL_TABLET | Freq: Every day | ORAL | Status: AC

## 2011-11-04 NOTE — Telephone Encounter (Signed)
Prednisone 20 mg dispensed 30 tabs directions 2 tabs x3 days, 1 tab x3 days, a half a tab x3 days, then a half a tablet Monday Wednesday Friday for a 2 week taper,,,,,,,,,,, refills x2

## 2011-11-04 NOTE — Telephone Encounter (Signed)
Rx sent and patient is aware. 

## 2011-11-04 NOTE — Telephone Encounter (Signed)
Pt called and said that he has poison oak again and is req a renewal of predniSONE (DELTASONE) 10 MG tablet. Pls call in to [facility] on [address].

## 2011-12-20 ENCOUNTER — Telehealth: Payer: Self-pay | Admitting: Family Medicine

## 2011-12-20 DIAGNOSIS — E785 Hyperlipidemia, unspecified: Secondary | ICD-10-CM

## 2011-12-20 MED ORDER — SIMVASTATIN 40 MG PO TABS: 40.0000 mg | ORAL_TABLET | Freq: Every day | ORAL | Status: DC

## 2011-12-20 NOTE — Telephone Encounter (Signed)
Patient called stating that he is out of refills of his simvastatin and need it sent to [facility] on [address]. Please assist.

## 2012-01-12 ENCOUNTER — Encounter: Payer: Medicare Other | Admitting: Family Medicine

## 2012-02-01 ENCOUNTER — Encounter: Payer: Self-pay | Admitting: Family Medicine

## 2012-02-01 ENCOUNTER — Ambulatory Visit (INDEPENDENT_AMBULATORY_CARE_PROVIDER_SITE_OTHER): Payer: Medicare Other | Admitting: Family Medicine

## 2012-02-01 VITALS — BP 110/80 | Temp 98.3°F | Ht 67.75 in | Wt 214.0 lb

## 2012-02-01 DIAGNOSIS — N138 Other obstructive and reflux uropathy: Secondary | ICD-10-CM

## 2012-02-01 DIAGNOSIS — J309 Allergic rhinitis, unspecified: Secondary | ICD-10-CM

## 2012-02-01 DIAGNOSIS — Z23 Encounter for immunization: Secondary | ICD-10-CM

## 2012-02-01 DIAGNOSIS — E785 Hyperlipidemia, unspecified: Secondary | ICD-10-CM

## 2012-02-01 DIAGNOSIS — N401 Enlarged prostate with lower urinary tract symptoms: Secondary | ICD-10-CM

## 2012-02-01 DIAGNOSIS — K219 Gastro-esophageal reflux disease without esophagitis: Secondary | ICD-10-CM

## 2012-02-01 DIAGNOSIS — Z0000 Encounter for general adult medical examination without abnormal findings: Secondary | ICD-10-CM

## 2012-02-01 DIAGNOSIS — N139 Obstructive and reflux uropathy, unspecified: Secondary | ICD-10-CM

## 2012-02-01 DIAGNOSIS — H9190 Unspecified hearing loss, unspecified ear: Secondary | ICD-10-CM

## 2012-02-01 DIAGNOSIS — L255 Unspecified contact dermatitis due to plants, except food: Secondary | ICD-10-CM

## 2012-02-01 MED ORDER — SIMVASTATIN 40 MG PO TABS: 40.0000 mg | ORAL_TABLET | Freq: Every day | ORAL | Status: DC

## 2012-02-01 NOTE — Progress Notes (Signed)
Subjective:    Patient ID: Carl Harrell, male    DOB: [birth year], 66 y.o.   MRN: [id number]  HPI Carl Harrell is a 66 year old male nonsmoker who comes in today for general Medicare wellness exam  He has a history of underlying hyperlipidemia for which he takes Zocor 40 mg daily and an aspirin tablet  He takes Allegra for allergic rhinitis and omeprazole 20 mg twice a day which he gets in [place] for his reflux. He gets routine eye care, no recent dental care, referred to Dr. Alvester Morin, recent colonoscopy 2013 normal prior that he had a bunch of polyps.  Cognitive function normal he walks on a regular basis home health safety reviewed no issues identified, no guns in the house, he does have a health care power of attorney and living will  Pneumovax 2010 tetanus 2004 information given on shingles seasonal flu shot today he declines he shingles vaccine because he states he had the disease  He has significant hearing loss referred for cardiology consult and hearing aids. 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 He's physical a very active [facility] [facility]. He states his bones and joints feels so much better when he has a flare up poison ivy and has to take prednisone   Review of Systems  Constitutional: Negative.   HENT: Negative.   Eyes: Negative.   Respiratory: Negative.   Cardiovascular: Negative.   Gastrointestinal: Negative.   Genitourinary: Negative.   Musculoskeletal: Negative.   Skin: Negative.   Neurological: Negative.   Hematological: Negative.   Psychiatric/Behavioral: Negative.        Objective:   Physical Exam  Constitutional: He is oriented to person, place, and time. He appears well-developed and well-nourished.  HENT:  Head: Normocephalic and atraumatic.  Right Ear: External ear normal.  Left Ear: External ear normal.  Nose: Nose normal.  Mouth/Throat: Oropharynx is clear and moist.  Eyes: Conjunctivae and EOM are normal.  Pupils are equal, round, and reactive to light. Right eye exhibits no discharge. Left eye exhibits no discharge. No scleral icterus.  Neck: Normal range of motion. Neck supple. No JVD present. No tracheal deviation present. No thyromegaly present.  Cardiovascular: Normal rate, regular rhythm, normal heart sounds and intact distal pulses.  Exam reveals no gallop and no friction rub.   No murmur heard. Pulmonary/Chest: Effort normal and breath sounds normal. No stridor. No respiratory distress. He has no wheezes. He has no rales. He exhibits no tenderness.  Abdominal: Soft. Bowel sounds are normal. He exhibits no distension and no mass. There is no tenderness. There is no rebound and no guarding.  Genitourinary: Rectum normal, prostate normal and penis normal. Guaiac negative stool. No penile tenderness.  Musculoskeletal: Normal range of motion. He exhibits no edema and no tenderness.  Lymphadenopathy:    He has no cervical adenopathy.  Neurological: He is alert and oriented to person, place, and time. He has normal reflexes. He displays normal reflexes. No cranial nerve deficit. He exhibits normal muscle tone. Coordination normal.  Skin: Skin is warm and dry. No rash noted. No erythema. No pallor.  Psychiatric: He has a normal mood and affect. His behavior is normal. Judgment and thought content normal.   Prostate is symmetrical 2+ nonnodular however he complains of frequent urination and nocturia x3 he is consuming 3 diet Mountain Dew's daily       Assessment & Plan:  Healthy male  History of  allergic rhinitis continue Allegra  Hyperlipidemia continue Zocor and aspirin  Contact dermatitis recurrent prednisone when necessary  Osteoarthritis Motrin 400 mg twice a day  History reflux esophagitis continue OTC omeprazole 20 mg twice a day  History colon polyps recent colonoscopy normal  Hearing loss referred to cardiology consult  Urinary frequency DC [facility]

## 2012-02-01 NOTE — Patient Instructions (Signed)
Continue your current medications  If you have a flareup poison ivy take the prednisone as directed  For your bone and joint pain Motrin 400 mg twice daily with food  Return in one year for general physical examination sooner if any problems

## 2012-04-10 ENCOUNTER — Telehealth: Payer: Self-pay | Admitting: Family Medicine

## 2012-04-10 NOTE — Telephone Encounter (Signed)
Pt was seen on 02-01-2012 for cpx. Pt forgot to go to lab for fasting blood work. Can I sch?

## 2012-04-11 NOTE — Telephone Encounter (Signed)
ok 

## 2012-04-12 ENCOUNTER — Other Ambulatory Visit (INDEPENDENT_AMBULATORY_CARE_PROVIDER_SITE_OTHER): Payer: Medicare Other

## 2012-04-12 DIAGNOSIS — N139 Obstructive and reflux uropathy, unspecified: Secondary | ICD-10-CM

## 2012-04-12 DIAGNOSIS — N401 Enlarged prostate with lower urinary tract symptoms: Secondary | ICD-10-CM

## 2012-04-12 DIAGNOSIS — N138 Other obstructive and reflux uropathy: Secondary | ICD-10-CM

## 2012-04-12 DIAGNOSIS — E785 Hyperlipidemia, unspecified: Secondary | ICD-10-CM

## 2012-04-12 LAB — HEPATIC FUNCTION PANEL
ALT: 33 U/L (ref 0–53)
AST: 21 U/L (ref 0–37)
Albumin: 4 g/dL (ref 3.5–5.2)
Alkaline Phosphatase: 56 U/L (ref 39–117)
Bilirubin, Direct: 0.1 mg/dL (ref 0.0–0.3)
Total Protein: 7 g/dL (ref 6.0–8.3)

## 2012-04-12 LAB — CBC WITH DIFFERENTIAL/PLATELET
Basophils Absolute: 0.1 10*3/uL (ref 0.0–0.1)
Basophils Relative: 0.5 % (ref 0.0–3.0)
Eosinophils Absolute: 0.3 10*3/uL (ref 0.0–0.7)
MCHC: 32.6 g/dL (ref 30.0–36.0)
MCV: 85.1 fl (ref 78.0–100.0)
Monocytes Absolute: 0.9 10*3/uL (ref 0.1–1.0)
Neutrophils Relative %: 57.1 % (ref 43.0–77.0)
RBC: 5.52 Mil/uL (ref 4.22–5.81)
RDW: 13.2 % (ref 11.5–14.6)

## 2012-04-12 LAB — LIPID PANEL
Cholesterol: 165 mg/dL (ref 0–200)
LDL Cholesterol: 97 mg/dL (ref 0–99)
Triglycerides: 127 mg/dL (ref 0.0–149.0)

## 2012-04-12 LAB — POCT URINALYSIS DIPSTICK
Blood, UA: NEGATIVE
Nitrite, UA: NEGATIVE
Protein, UA: NEGATIVE
Spec Grav, UA: 1.02
Urobilinogen, UA: 0.2

## 2012-04-12 LAB — BASIC METABOLIC PANEL
CO2: 31 mEq/L (ref 19–32)
Calcium: 9.1 mg/dL (ref 8.4–10.5)
Creatinine, Ser: 1.1 mg/dL (ref 0.4–1.5)
Glucose, Bld: 85 mg/dL (ref 70–99)

## 2012-04-12 LAB — TSH: TSH: 1.44 u[IU]/mL (ref 0.35–5.50)

## 2012-04-12 NOTE — Telephone Encounter (Signed)
Pt has been sch

## 2012-10-12 ENCOUNTER — Telehealth: Payer: Self-pay | Admitting: Family Medicine

## 2012-10-12 NOTE — Telephone Encounter (Signed)
Nurse was out to hom for once yearly visit. Has concerns she wishes to pass to Dr Tawanna Cooler: Carl Harrell BP (140/102) asymptomatic; he's never been dx w/ HTN and is not on meds for it. Also, pt reports urinary freq and changes of stream. Prostate prob? Lastly, pt c/o confusion and balance problems. Nurse encouraged pt to make appt with [place]. Please advise. She does not know if pt will do so.

## 2012-10-12 NOTE — Telephone Encounter (Signed)
Patient has an appointment tomorrow 

## 2012-10-13 ENCOUNTER — Encounter: Payer: Self-pay | Admitting: Family Medicine

## 2012-10-13 ENCOUNTER — Ambulatory Visit (INDEPENDENT_AMBULATORY_CARE_PROVIDER_SITE_OTHER): Payer: Medicare Other | Admitting: Family Medicine

## 2012-10-13 VITALS — BP 140/96 | Temp 98.3°F | Wt 208.0 lb

## 2012-10-13 DIAGNOSIS — R350 Frequency of micturition: Secondary | ICD-10-CM

## 2012-10-13 DIAGNOSIS — R030 Elevated blood-pressure reading, without diagnosis of hypertension: Secondary | ICD-10-CM

## 2012-10-13 LAB — POCT URINALYSIS DIPSTICK
Bilirubin, UA: NEGATIVE
Ketones, UA: NEGATIVE
Leukocytes, UA: NEGATIVE
Nitrite, UA: NEGATIVE
Protein, UA: NEGATIVE

## 2012-10-13 NOTE — Progress Notes (Signed)
  Subjective:    Patient ID: Carl Harrell, male    DOB: 13-Jan-1946, 67 y.o.   MRN: [id number]  HPI Patient here concerning elevated blood pressure. Patient had a visit from a family nurse practitioner with his insurance company "[employer]" program. Reportedly had blood pressure left arm 138/100 and repeat 140/102. He was told followup here. Primary physician is out of office today Patient has history of hyperlipidemia treated with simvastatin. Never treated for hypertension. Denies any headaches, dizziness, or chest pains.  Patient also complains of occasional slow urinary stream but symptoms very inconsistent. No significant nocturia. No burning with urination. States he has not actually had any slow stream issues for about 2 months.  Patient retired early this year. Has gained about 20 pounds since retirement. Not exercising regularly. No regular alcohol use. No regular nonsteroidal use.  Past Medical History  Diagnosis Date  . ALLERGIC RHINITIS 01/30/2007  . GERD 09/26/2007  . HYPERLIPIDEMIA 09/26/2007  . PLANTAR FASCIITIS, BILATERAL 07/08/2009  . TRANSIENT ISCHEMIC ATTACK, HX OF 01/30/2007  . Unspecified hearing loss 06/16/2009  . ED (erectile dysfunction)   . PVD (peripheral vascular disease)    No past surgical history on file.  reports that he has quit smoking. He has never used smokeless tobacco. He reports that  drinks alcohol. He reports that he does not use illicit drugs. family history is negative for Cancer, and COPD, and Hyperlipidemia, . No Known Allergies    Review of Systems  Constitutional: Negative for fatigue and unexpected weight change.  Eyes: Negative for visual disturbance.  Respiratory: Negative for cough, chest tightness and shortness of breath.   Cardiovascular: Negative for chest pain, palpitations and leg swelling.  Neurological: Negative for dizziness, syncope, weakness, light-headedness and headaches.       Objective:   Physical Exam   Constitutional: He appears well-developed and well-nourished.  Neck: Neck supple. No thyromegaly present.  Cardiovascular: Normal rate and regular rhythm.   Pulmonary/Chest: Effort normal and breath sounds normal. No respiratory distress. He has no wheezes. He has no rales.  Musculoskeletal: He exhibits no edema.          Assessment & Plan:  #1 elevated blood pressure. Repeat reading here after rest left arm seated 142/92. Discussed options. Rather than medication he prefers lifestyle management. Trial of weight loss. Reduce sodium intake. Reassess with primary in 3 months #2 occasional urine frequency. No consistent obstructive symptoms. Urine dipstick clear. Followup with primary if recurrent urinary symptoms

## 2012-10-13 NOTE — Patient Instructions (Signed)
Try to lose some weight Reduce sodium intake. Follow up with Dr Tawanna Cooler within 3 months to reassess blood pressure.

## 2013-01-11 ENCOUNTER — Ambulatory Visit: Payer: Medicare Other | Admitting: Family Medicine

## 2013-02-22 ENCOUNTER — Ambulatory Visit (INDEPENDENT_AMBULATORY_CARE_PROVIDER_SITE_OTHER): Payer: Medicare Other | Admitting: Family Medicine

## 2013-02-22 ENCOUNTER — Encounter: Payer: Self-pay | Admitting: Family Medicine

## 2013-02-22 VITALS — BP 120/88 | Temp 98.3°F | Wt 207.0 lb

## 2013-02-22 DIAGNOSIS — J309 Allergic rhinitis, unspecified: Secondary | ICD-10-CM

## 2013-02-22 DIAGNOSIS — R030 Elevated blood-pressure reading, without diagnosis of hypertension: Secondary | ICD-10-CM

## 2013-02-22 DIAGNOSIS — Z8679 Personal history of other diseases of the circulatory system: Secondary | ICD-10-CM

## 2013-02-22 DIAGNOSIS — Z23 Encounter for immunization: Secondary | ICD-10-CM

## 2013-02-22 DIAGNOSIS — E785 Hyperlipidemia, unspecified: Secondary | ICD-10-CM

## 2013-02-22 NOTE — Progress Notes (Signed)
  Subjective:    Patient ID: Carl Harrell, male    DOB: 07/22/1945, 67 y.o.   MRN: [id number]  HPI Carl Harrell is a 67 year old male who comes in today for evaluation of elevated blood pressure  He was seen by a home health care nurse a [facility] and was told he had elevated blood pressure and to come see [place] for followup. His blood pressures in the past of all been normal. He is not recall what the nurse got at home. BP today by me right arm sitting position 150/90 pulse 70 and regular  He also complains of changing coordination sore knees low back pain guns on his hand and change in the size of his testicles. His last physical examination was about a year ago.   Review of Systems    review of systems otherwise negative except for positive family history of hypertension Objective:   Physical Exam Well-developed well-nourished male no acute distress BP right I'm sitting position 150/90 pulse 70 and regular       Assessment & Plan:  Elevated blood pressure question hypertension,,,,,,,, BP check daily followup in 3 weeks  Multiple other concerns 3 weeks physical exam

## 2013-02-22 NOTE — Patient Instructions (Signed)
Purchase a digital pump up blood pressure cuff,,,,,,,,,,,,omron ,,,,,,,,, from [employer] your blood pressure daily in the morning  Walk 30 minutes daily  Set up a time in the next week or 2 for fasting blood work  Return in 3 weeks for a 30 minute appointment to evaluate your other problems and to review your your blood pressure readings  Remember to bring all your blood pressure readings and the device with you when you come

## 2013-03-13 ENCOUNTER — Other Ambulatory Visit (INDEPENDENT_AMBULATORY_CARE_PROVIDER_SITE_OTHER): Payer: Medicare Other

## 2013-03-13 DIAGNOSIS — J309 Allergic rhinitis, unspecified: Secondary | ICD-10-CM

## 2013-03-13 DIAGNOSIS — E785 Hyperlipidemia, unspecified: Secondary | ICD-10-CM

## 2013-03-13 DIAGNOSIS — Z8679 Personal history of other diseases of the circulatory system: Secondary | ICD-10-CM

## 2013-03-13 DIAGNOSIS — R030 Elevated blood-pressure reading, without diagnosis of hypertension: Secondary | ICD-10-CM

## 2013-03-13 LAB — CBC WITH DIFFERENTIAL/PLATELET
Basophils Relative: 0.4 % (ref 0.0–3.0)
Eosinophils Relative: 3.9 % (ref 0.0–5.0)
HCT: 44.9 % (ref 39.0–52.0)
Hemoglobin: 15.1 g/dL (ref 13.0–17.0)
Lymphocytes Relative: 29.4 % (ref 12.0–46.0)
Lymphs Abs: 2.6 10*3/uL (ref 0.7–4.0)
MCV: 83.5 fl (ref 78.0–100.0)
Monocytes Absolute: 0.8 10*3/uL (ref 0.1–1.0)
Monocytes Relative: 8.8 % (ref 3.0–12.0)
Platelets: 258 10*3/uL (ref 150.0–400.0)
RBC: 5.37 Mil/uL (ref 4.22–5.81)
WBC: 9 10*3/uL (ref 4.5–10.5)

## 2013-03-13 LAB — POCT URINALYSIS DIPSTICK
Blood, UA: NEGATIVE
Glucose, UA: NEGATIVE
Spec Grav, UA: 1.015
Urobilinogen, UA: 0.2

## 2013-03-13 LAB — BASIC METABOLIC PANEL
CO2: 27 mEq/L (ref 19–32)
Calcium: 9.1 mg/dL (ref 8.4–10.5)
Chloride: 102 mEq/L (ref 96–112)
Creatinine, Ser: 1.1 mg/dL (ref 0.4–1.5)
Glucose, Bld: 80 mg/dL (ref 70–99)
Sodium: 139 mEq/L (ref 135–145)

## 2013-03-13 LAB — TSH: TSH: 1.58 u[IU]/mL (ref 0.35–5.50)

## 2013-03-13 LAB — HEPATIC FUNCTION PANEL
ALT: 32 U/L (ref 0–53)
AST: 21 U/L (ref 0–37)
Bilirubin, Direct: 0.1 mg/dL (ref 0.0–0.3)
Total Protein: 6.7 g/dL (ref 6.0–8.3)

## 2013-03-13 LAB — LIPID PANEL
Cholesterol: 146 mg/dL (ref 0–200)
HDL: 41.2 mg/dL (ref >39.00)
VLDL: 23.8 mg/dL (ref 0.0–40.0)

## 2013-03-20 ENCOUNTER — Encounter: Payer: Self-pay | Admitting: Family Medicine

## 2013-03-20 ENCOUNTER — Ambulatory Visit (INDEPENDENT_AMBULATORY_CARE_PROVIDER_SITE_OTHER): Payer: Medicare Other | Admitting: Family Medicine

## 2013-03-20 VITALS — BP 140/90 | Temp 98.0°F | Wt 204.0 lb

## 2013-03-20 DIAGNOSIS — Z8679 Personal history of other diseases of the circulatory system: Secondary | ICD-10-CM

## 2013-03-20 DIAGNOSIS — Z0000 Encounter for general adult medical examination without abnormal findings: Secondary | ICD-10-CM

## 2013-03-20 DIAGNOSIS — R030 Elevated blood-pressure reading, without diagnosis of hypertension: Secondary | ICD-10-CM

## 2013-03-20 DIAGNOSIS — H9190 Unspecified hearing loss, unspecified ear: Secondary | ICD-10-CM

## 2013-03-20 DIAGNOSIS — E785 Hyperlipidemia, unspecified: Secondary | ICD-10-CM

## 2013-03-20 DIAGNOSIS — K219 Gastro-esophageal reflux disease without esophagitis: Secondary | ICD-10-CM

## 2013-03-20 MED ORDER — OMEPRAZOLE 20 MG PO CPDR: 20.0000 mg | DELAYED_RELEASE_CAPSULE | Freq: Two times a day (BID) | ORAL | Status: DC

## 2013-03-20 MED ORDER — SIMVASTATIN 40 MG PO TABS: 40.0000 mg | ORAL_TABLET | Freq: Every day | ORAL | Status: DC

## 2013-03-20 NOTE — Patient Instructions (Signed)
Purchase a pump up digital blood pressure cuff,,,,,,,,omron seems to be the most reliable brand name,,,,,,,,, and check your blood pressure daily in the morning  Your blood pressure today was 140/90  Blood pressure goal taught him are less than 135 bottom number less than 85  Return in 3 weeks with a record of all your blood pressure readings and the device  Avoid salt  Continue your healthy exercise and lifestyle

## 2013-03-20 NOTE — Progress Notes (Signed)
  Subjective:    Patient ID: Carl Harrell, male    DOB: [birth year], 67 y.o.   MRN: [id number]  HPI [name] is a 67 year old male who comes in today for general physical examination  We saw him a couple weeks ago with a question of elevated blood pressure. He purchased a wrist blood pressure cuff which is not giving accurate data.  He takes omeprazole 20 mg daily because of reflux simvastatin 40 mg daily and an aspirin tablet because of a history of hyperlipidemia  He also has bilateral hearing aids  He gets routine eye care, dental care, colonoscopy and GI, vaccinations up-to-date  Cognitive function normal he walks on regular basis home health safety reviewed no issues identified, no guns in the house, he does have a health care power of attorney and living well   Review of Systems  Constitutional: Negative.   HENT: Negative.   Eyes: Negative.   Respiratory: Negative.   Cardiovascular: Negative.   Gastrointestinal: Negative.   Genitourinary: Negative.   Musculoskeletal: Negative.   Skin: Negative.   Neurological: Negative.   Psychiatric/Behavioral: Negative.        Objective:   Physical Exam  Nursing note and vitals reviewed. Constitutional: He is oriented to person, place, and time. He appears well-developed and well-nourished.  HENT:  Head: Normocephalic and atraumatic.  Right Ear: External ear normal.  Left Ear: External ear normal.  Nose: Nose normal.  Mouth/Throat: Oropharynx is clear and moist.  Bilateral hearing aids  Eyes: Conjunctivae and EOM are normal. Pupils are equal, round, and reactive to light.  Neck: Normal range of motion. Neck supple. No JVD present. No tracheal deviation present. No thyromegaly present.  Cardiovascular: Normal rate, regular rhythm, normal heart sounds and intact distal pulses.  Exam reveals no gallop and no friction rub.   No murmur heard. No carotid artery bruits peripheral pulses 1+ and symmetrical  Pulmonary/Chest: Effort normal  and breath sounds normal. No stridor. No respiratory distress. He has no wheezes. He has no rales. He exhibits no tenderness.  Abdominal: Soft. Bowel sounds are normal. He exhibits no distension and no mass. There is no tenderness. There is no rebound and no guarding.  Genitourinary: Rectum normal and penis normal. Guaiac negative stool. No penile tenderness.  3+ symmetrical BPH  Musculoskeletal: Normal range of motion. He exhibits no edema and no tenderness.  Lymphadenopathy:    He has no cervical adenopathy.  Neurological: He is alert and oriented to person, place, and time. He has normal reflexes. No cranial nerve deficit. He exhibits normal muscle tone.  Skin: Skin is warm and dry. No rash noted. No erythema. No pallor.  Psychiatric: He has a normal mood and affect. His behavior is normal. Judgment and thought content normal.          Assessment & Plan:  Healthy male question hypertension,,,,,,,, BP check daily followup in 3 weeks  Reflux esophagitis continue omeprazole  Allergic rhinitis continue Allegra  Hyperlipidemia continue simvastatin and aspirin  BPH with elevated PSA up to 6 now back to 4,,,,,,,, monitor

## 2013-03-25 ENCOUNTER — Other Ambulatory Visit: Payer: Self-pay | Admitting: Family Medicine

## 2013-04-10 ENCOUNTER — Ambulatory Visit (INDEPENDENT_AMBULATORY_CARE_PROVIDER_SITE_OTHER): Payer: Medicare Other | Admitting: Family Medicine

## 2013-04-10 ENCOUNTER — Encounter: Payer: Self-pay | Admitting: Family Medicine

## 2013-04-10 VITALS — BP 150/98 | Temp 97.7°F | Wt 210.0 lb

## 2013-04-10 DIAGNOSIS — I10 Essential (primary) hypertension: Secondary | ICD-10-CM | POA: Insufficient documentation

## 2013-04-10 MED ORDER — LISINOPRIL-HYDROCHLOROTHIAZIDE 10-12.5 MG PO TABS: 1.0000 | ORAL_TABLET | Freq: Every day | ORAL | Status: DC

## 2013-04-10 NOTE — Patient Instructions (Signed)
Begin Zestoretic............ one tablet daily in the morning  Check your blood pressure daily in the morning  Return December 3 for followup

## 2013-04-10 NOTE — Progress Notes (Signed)
Pre visit review using our clinic review tool, if applicable. No additional management support is needed unless otherwise documented below in the visit note. 

## 2013-04-10 NOTE — Progress Notes (Signed)
  Subjective:    Patient ID: Carl Harrell, male    DOB: June 13, 1945, 67 y.o.   MRN: [id number]  HPI Arthor is a 67 year old married male nonsmoker who comes in today for followup of elevated blood pressure  We saw him a couple weeks ago his blood pressure is elevated for the first time. He's monitoring his blood pressure at home his systolics range from 140-150 diastolics from 85-95   Review of Systems Negative    Objective:   Physical Exam  Well-developed and nourished male no acute distress vital signs stable he is afebrile BP in his right arm sitting position is 150/98      Assessment & Plan:  Hypertension begin ACE inhibitor followup in 3 weeks

## 2013-04-26 ENCOUNTER — Ambulatory Visit (INDEPENDENT_AMBULATORY_CARE_PROVIDER_SITE_OTHER): Payer: Medicare Other | Admitting: Family Medicine

## 2013-04-26 ENCOUNTER — Encounter: Payer: Self-pay | Admitting: Family Medicine

## 2013-04-26 VITALS — BP 142/88 | Temp 97.8°F | Wt 210.0 lb

## 2013-04-26 DIAGNOSIS — I10 Essential (primary) hypertension: Secondary | ICD-10-CM

## 2013-04-26 LAB — BASIC METABOLIC PANEL
BUN: 15 mg/dL (ref 6–23)
CO2: 30 mEq/L (ref 19–32)
Chloride: 103 mEq/L (ref 96–112)
Creatinine, Ser: 1.3 mg/dL (ref 0.4–1.5)
Glucose, Bld: 82 mg/dL (ref 70–99)
Potassium: 4.3 mEq/L (ref 3.5–5.1)

## 2013-04-26 MED ORDER — LISINOPRIL-HYDROCHLOROTHIAZIDE 20-12.5 MG PO TABS: 1.0000 | ORAL_TABLET | Freq: Every day | ORAL | Status: DC

## 2013-04-26 NOTE — Progress Notes (Signed)
Pre visit review using our clinic review tool, if applicable. No additional management support is needed unless otherwise documented below in the visit note. 

## 2013-04-26 NOTE — Patient Instructions (Signed)
Increase the Zestoretic to 20 mg daily  Check your blood pressure daily in the morning  Return in one month for followup  Labs today

## 2013-04-26 NOTE — Progress Notes (Signed)
   Subjective:    Patient ID: Carl Harrell, male    DOB: 1946-01-21, 67 y.o.   MRN: [id number]  HPI Carl Harrell is a 67 year old married male nonsmoker who comes in today for evaluation of underlying hypertension  He's blood pressures running about 135 the 140 systolic and diastolic in in the 90 range. He's been compliant with his daily medication. No cough or hives   Review of Systems No side effects    Objective:   Physical Exam  Well-developed well-nourished male no acute distress BP right I'm sitting position 140 to radiate      Assessment & Plan:  Hypertension approaching goal,,,,,, increase ACE to 20 mg daily BP check daily followup in one month

## 2013-04-26 NOTE — Progress Notes (Signed)
Pt informed

## 2013-05-28 ENCOUNTER — Ambulatory Visit (INDEPENDENT_AMBULATORY_CARE_PROVIDER_SITE_OTHER): Payer: Medicare HMO | Admitting: Family Medicine

## 2013-05-28 ENCOUNTER — Ambulatory Visit: Payer: Medicare Other | Admitting: Family Medicine

## 2013-05-28 ENCOUNTER — Encounter: Payer: Self-pay | Admitting: Family Medicine

## 2013-05-28 VITALS — BP 120/84 | Temp 99.0°F | Wt 213.0 lb

## 2013-05-28 DIAGNOSIS — T464X5A Adverse effect of angiotensin-converting-enzyme inhibitors, initial encounter: Principal | ICD-10-CM

## 2013-05-28 DIAGNOSIS — R059 Cough, unspecified: Secondary | ICD-10-CM

## 2013-05-28 DIAGNOSIS — I10 Essential (primary) hypertension: Secondary | ICD-10-CM

## 2013-05-28 DIAGNOSIS — R058 Other specified cough: Secondary | ICD-10-CM

## 2013-05-28 DIAGNOSIS — T44905A Adverse effect of unspecified drugs primarily affecting the autonomic nervous system, initial encounter: Secondary | ICD-10-CM

## 2013-05-28 DIAGNOSIS — R05 Cough: Secondary | ICD-10-CM

## 2013-05-28 HISTORY — DX: Other specified cough: R05.8

## 2013-05-28 HISTORY — DX: Adverse effect of angiotensin-converting-enzyme inhibitors, initial encounter: T46.4X5A

## 2013-05-28 MED ORDER — LOSARTAN POTASSIUM-HCTZ 50-12.5 MG PO TABS: 1.0000 | ORAL_TABLET | Freq: Every day | ORAL | Status: DC

## 2013-05-28 NOTE — Progress Notes (Signed)
   Subjective:    Patient ID: Carl Harrell, male    DOB: 1945/12/22, 68 y.o.   MRN: [id number]  HPI Carl Harrell is a 68 year old male nonsmoker who comes in today for followup followup of hypertension  We increased his Zestoretic to 20 mg daily because on 10 mg daily his blood pressure was not normal. Now his BP is 120/84. He does have a cough with the ACE inhibitor seems to be getting worse   Review of Systems Review of systems negative    Objective:   Physical Exam  Well-developed well-nourished male no acute distress vital signs stable he is afebrile BP right arm sitting position 120/80      Assessment & Plan:  Hypertension at goal  Cough secondary to ACE inhibitor switch to Hyzaar

## 2013-05-28 NOTE — Progress Notes (Signed)
Pre visit review using our clinic review tool, if applicable. No additional management support is needed unless otherwise documented below in the visit note. 

## 2013-05-28 NOTE — Patient Instructions (Signed)
Stop the Zestoretic  Begin Hyzaar,,,,,,,,,,, one tablet daily in the morning  6 return for your annual physical examination sooner if any problems

## 2013-06-19 ENCOUNTER — Ambulatory Visit (INDEPENDENT_AMBULATORY_CARE_PROVIDER_SITE_OTHER): Payer: Medicare HMO | Admitting: Family Medicine

## 2013-06-19 ENCOUNTER — Encounter: Payer: Self-pay | Admitting: Family Medicine

## 2013-06-19 ENCOUNTER — Ambulatory Visit (INDEPENDENT_AMBULATORY_CARE_PROVIDER_SITE_OTHER)
Admission: RE | Admit: 2013-06-19 | Discharge: 2013-06-19 | Disposition: A | Discharge status: Home / Self Care | Payer: Medicare HMO | Source: Ambulatory Visit | Attending: Family Medicine | Admitting: Family Medicine

## 2013-06-19 VITALS — BP 120/90 | Temp 100.0°F | Wt 209.0 lb

## 2013-06-19 DIAGNOSIS — R05 Cough: Secondary | ICD-10-CM

## 2013-06-19 DIAGNOSIS — R059 Cough, unspecified: Secondary | ICD-10-CM

## 2013-06-19 DIAGNOSIS — J189 Pneumonia, unspecified organism: Secondary | ICD-10-CM

## 2013-06-19 MED ORDER — AZITHROMYCIN 250 MG PO TABS: ORAL_TABLET | ORAL | Status: DC

## 2013-06-19 MED ORDER — HYDROCODONE-HOMATROPINE 5-1.5 MG/5ML PO SYRP: 5.0000 mL | ORAL_SOLUTION | Freq: Three times a day (TID) | ORAL | Status: DC | PRN

## 2013-06-19 NOTE — Progress Notes (Signed)
Pre visit review using our clinic review tool, if applicable. No additional management support is needed unless otherwise documented below in the visit note. 

## 2013-06-19 NOTE — Progress Notes (Signed)
   Subjective:    Patient ID: Carl Harrell, male    DOB: 06-01-1945, 68 y.o.   MRN: [id number]  HPI Thunder is a 68 year old male nonsmoker who comes in today with a four-day history of coughing  He's had no fever but he said he said chills. No earache or sore throat. Is not bringing up any sputum. He had difficulty sleeping last night because of the coughing. 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 He's not around sick children   Review of Systems Review of systems otherwise negative    Objective:   Physical Exam  Well-developed well-nourished male no acute distress vital signs stable he is afebrile HEENT HEENT negative neck was supple no adenopathy lungs are clear except difficult to assess as breath sounds because her he time he takes a deep breath he has coughing spasms.  Was sent for a chest x-ray      Assessment & Plan:  Cough probably secondary to viral syndrome,,,,,, chest x-ray rule out pneumonia treat appropriately chest x-ray shows Mealor pneumonia. Will start on azithromycin

## 2013-06-19 NOTE — Patient Instructions (Signed)
Your chest x-ray shows to have early pneumonia  Rest at home  Drink lots of water  Tylenol,,,,,, 2 tabs 3 times daily for fever and chills  Hydromet,,,,, 1/2-1 teaspoon 3 times daily for cough  Azithromycin,,,,,,,, one tablet twice daily  Return on Monday for followup

## 2013-06-22 ENCOUNTER — Encounter: Payer: Self-pay | Admitting: *Deleted

## 2013-06-25 ENCOUNTER — Ambulatory Visit (INDEPENDENT_AMBULATORY_CARE_PROVIDER_SITE_OTHER): Payer: Medicare HMO | Admitting: Family Medicine

## 2013-06-25 ENCOUNTER — Encounter: Payer: Self-pay | Admitting: Family Medicine

## 2013-06-25 VITALS — BP 120/80 | HR 99 | Temp 98.9°F | Wt 210.0 lb

## 2013-06-25 DIAGNOSIS — J189 Pneumonia, unspecified organism: Secondary | ICD-10-CM

## 2013-06-25 NOTE — Patient Instructions (Signed)
Azithromycin............. one tablet twice daily till bilateral empty  Hydromet,,,,,,, 1/2-1 teaspoon at bedtime when necessary for cough  Okay to resume exercise  Return when necessary

## 2013-06-25 NOTE — Progress Notes (Signed)
   Subjective:    Patient ID: Carl Harrell, male    DOB: 1945-08-30, 68 y.o.   MRN: [id number]  HPI Time is a 68 year old married male nonsmoker who comes in today for followup of pneumonia  And we saw him last week with symptoms of fever chills cough. Chest x-ray showed a right lingular pneumonia. We started him on azithromycin 250 mg twice a day liquids rest at home and cough syrup. He was back today saying he feels very much back to normal   Review of Systems    review of systems negative Objective:   Physical Exam  Well-developed well-nourished male no acute distress vital signs stable he is afebrile HEENT negative neck was supple lungs are clear      Assessment & Plan:  Community-acquired pneumonia resolved with medication return when necessary

## 2013-12-21 ENCOUNTER — Other Ambulatory Visit: Payer: Self-pay | Admitting: Family Medicine

## 2014-03-14 ENCOUNTER — Other Ambulatory Visit (INDEPENDENT_AMBULATORY_CARE_PROVIDER_SITE_OTHER): Payer: Medicare HMO

## 2014-03-14 DIAGNOSIS — E781 Pure hyperglyceridemia: Secondary | ICD-10-CM

## 2014-03-14 DIAGNOSIS — Z0000 Encounter for general adult medical examination without abnormal findings: Secondary | ICD-10-CM

## 2014-03-14 DIAGNOSIS — R972 Elevated prostate specific antigen [PSA]: Secondary | ICD-10-CM

## 2014-03-14 DIAGNOSIS — I10 Essential (primary) hypertension: Secondary | ICD-10-CM

## 2014-03-14 LAB — LIPID PANEL
Cholesterol: 159 mg/dL (ref 0–200)
HDL: 34.8 mg/dL — ABNORMAL LOW (ref >39.00)
LDL Cholesterol: 88 mg/dL (ref 0–99)
NonHDL: 124.2
Total CHOL/HDL Ratio: 5
Triglycerides: 183 mg/dL — ABNORMAL HIGH (ref 0.0–149.0)
VLDL: 36.6 mg/dL (ref 0.0–40.0)

## 2014-03-14 LAB — BASIC METABOLIC PANEL
BUN: 13 mg/dL (ref 6–23)
CO2: 30 mEq/L (ref 19–32)
CREATININE: 1.1 mg/dL (ref 0.4–1.5)
Calcium: 8.7 mg/dL (ref 8.4–10.5)
Chloride: 103 mEq/L (ref 96–112)
GFR: 69.47 mL/min (ref >60.00)
GLUCOSE: 94 mg/dL (ref 70–99)
Potassium: 3.9 mEq/L (ref 3.5–5.1)
Sodium: 139 mEq/L (ref 135–145)

## 2014-03-14 LAB — CBC WITH DIFFERENTIAL/PLATELET
BASOS ABS: 0 10*3/uL (ref 0.0–0.1)
Basophils Relative: 0.5 % (ref 0.0–3.0)
EOS PCT: 7.8 % — AB (ref 0.0–5.0)
Eosinophils Absolute: 0.8 10*3/uL — ABNORMAL HIGH (ref 0.0–0.7)
HEMATOCRIT: 47.3 % (ref 39.0–52.0)
Hemoglobin: 15.3 g/dL (ref 13.0–17.0)
LYMPHS ABS: 2.5 10*3/uL (ref 0.7–4.0)
Lymphocytes Relative: 24 % (ref 12.0–46.0)
MCHC: 32.2 g/dL (ref 30.0–36.0)
MCV: 85.8 fl (ref 78.0–100.0)
MONO ABS: 0.9 10*3/uL (ref 0.1–1.0)
Monocytes Relative: 8.5 % (ref 3.0–12.0)
NEUTROS PCT: 59.2 % (ref 43.0–77.0)
Neutro Abs: 6.2 10*3/uL (ref 1.4–7.7)
PLATELETS: 247 10*3/uL (ref 150.0–400.0)
RBC: 5.52 Mil/uL (ref 4.22–5.81)
RDW: 14.2 % (ref 11.5–15.5)
WBC: 10.5 10*3/uL (ref 4.0–10.5)

## 2014-03-14 LAB — POCT URINALYSIS DIPSTICK
Bilirubin, UA: NEGATIVE
Blood, UA: NEGATIVE
Glucose, UA: NEGATIVE
Ketones, UA: NEGATIVE
LEUKOCYTES UA: NEGATIVE
NITRITE UA: NEGATIVE
PH UA: 6
PROTEIN UA: NEGATIVE
Spec Grav, UA: 1.02
UROBILINOGEN UA: 0.2

## 2014-03-14 LAB — HEPATIC FUNCTION PANEL
ALT: 44 U/L (ref 0–53)
AST: 27 U/L (ref 0–37)
Albumin: 3.5 g/dL (ref 3.5–5.2)
Alkaline Phosphatase: 50 U/L (ref 39–117)
BILIRUBIN DIRECT: 0.1 mg/dL (ref 0.0–0.3)
BILIRUBIN TOTAL: 1 mg/dL (ref 0.2–1.2)
Total Protein: 6.8 g/dL (ref 6.0–8.3)

## 2014-03-14 LAB — PSA: PSA: 6.52 ng/mL — ABNORMAL HIGH (ref 0.10–4.00)

## 2014-03-14 LAB — TSH: TSH: 1.53 u[IU]/mL (ref 0.35–4.50)

## 2014-03-21 ENCOUNTER — Ambulatory Visit (INDEPENDENT_AMBULATORY_CARE_PROVIDER_SITE_OTHER): Payer: Medicare HMO | Admitting: Family Medicine

## 2014-03-21 ENCOUNTER — Encounter: Payer: Self-pay | Admitting: Family Medicine

## 2014-03-21 VITALS — BP 120/84 | Temp 97.6°F | Ht 67.0 in | Wt 214.0 lb

## 2014-03-21 DIAGNOSIS — R269 Unspecified abnormalities of gait and mobility: Secondary | ICD-10-CM

## 2014-03-21 DIAGNOSIS — E781 Pure hyperglyceridemia: Secondary | ICD-10-CM

## 2014-03-21 DIAGNOSIS — Z23 Encounter for immunization: Secondary | ICD-10-CM

## 2014-03-21 DIAGNOSIS — I10 Essential (primary) hypertension: Secondary | ICD-10-CM

## 2014-03-21 DIAGNOSIS — K219 Gastro-esophageal reflux disease without esophagitis: Secondary | ICD-10-CM

## 2014-03-21 DIAGNOSIS — J301 Allergic rhinitis due to pollen: Secondary | ICD-10-CM

## 2014-03-21 DIAGNOSIS — R972 Elevated prostate specific antigen [PSA]: Secondary | ICD-10-CM

## 2014-03-21 DIAGNOSIS — T464X5A Adverse effect of angiotensin-converting-enzyme inhibitors, initial encounter: Secondary | ICD-10-CM

## 2014-03-21 DIAGNOSIS — R05 Cough: Secondary | ICD-10-CM

## 2014-03-21 MED ORDER — SIMVASTATIN 40 MG PO TABS: ORAL_TABLET | ORAL | Status: DC

## 2014-03-21 MED ORDER — LOSARTAN POTASSIUM-HCTZ 50-12.5 MG PO TABS: 1.0000 | ORAL_TABLET | Freq: Every day | ORAL | Status: DC

## 2014-03-21 MED ORDER — OMEPRAZOLE 20 MG PO CPDR: DELAYED_RELEASE_CAPSULE | ORAL | Status: DC

## 2014-03-21 NOTE — Progress Notes (Signed)
   Subjective:    Patient ID: Carl Harrell, male    DOB: 1945/08/15, 68 y.o.   MRN: [id number]  HPI Desmund is a 68 year old male married nonsmoker who comes in today for evaluation of hypertension and allergic rhinitis reflux esophagitis hyperlipidemia and a new problem of an abnormal gait  Med list reviewed the min no changes. BP 120/84 on Hyzaar 50-12 0.5 daily.  He does not get routine eye care nor dental care. Recommended yearly eye exams to screening for glaucoma and dental checks twice yearly he's had 6 colonoscopies because a history of polyps.  His dose 3 mL of difficulty with his gait. He describes that his imbalance. He specifically notices it when he bowls. He denies any other neurologic symptoms  Cognitive function normal he walks his dog daily home health safety reviewed no issues identified, no guns in the house, he does have a healthcare power of attorney and living well  Vaccinations updated by Apolonio Schneiders   Review of Systems  Constitutional: Negative.   HENT: Negative.   Eyes: Negative.   Respiratory: Negative.   Cardiovascular: Negative.   Gastrointestinal: Negative.   Endocrine: Negative.   Genitourinary: Negative.   Musculoskeletal: Negative.   Skin: Negative.   Allergic/Immunologic: Negative.   Neurological: Negative.   Hematological: Negative.   Psychiatric/Behavioral: Negative.        Objective:   Physical Exam  Nursing note and vitals reviewed. Constitutional: He is oriented to person, place, and time. He appears well-developed and well-nourished.  HENT:  Head: Normocephalic and atraumatic.  Right Ear: External ear normal.  Left Ear: External ear normal.  Nose: Nose normal.  Mouth/Throat: Oropharynx is clear and moist.  Bilateral hearing aids  Eyes: Conjunctivae and EOM are normal. Pupils are equal, round, and reactive to light.  Neck: Normal range of motion. Neck supple. No JVD present. No tracheal deviation present. No thyromegaly present.    Cardiovascular: Normal rate, regular rhythm, normal heart sounds and intact distal pulses.  Exam reveals no gallop and no friction rub.   No murmur heard. Pulmonary/Chest: Effort normal and breath sounds normal. No stridor. No respiratory distress. He has no wheezes. He has no rales. He exhibits no tenderness.  Abdominal: Soft. Bowel sounds are normal. He exhibits no distension and no mass. There is no tenderness. There is no rebound and no guarding.  Genitourinary: Rectum normal and penis normal. Guaiac negative stool. No penile tenderness.  1+ symmetrical nonnodular BPH has nocturia 2-3 however he drinks [facility] advised to stop the [facility]  Musculoskeletal: Normal range of motion. He exhibits no edema and no tenderness.  Lymphadenopathy:    He has no cervical adenopathy.  Neurological: He is alert and oriented to person, place, and time. He has normal reflexes. He displays normal reflexes. No cranial nerve deficit. He exhibits normal muscle tone. Coordination abnormal.  Specifically difficulty with the toe heel walking.  Skin: Skin is warm and dry. No rash noted. No erythema. No pallor.  Total body skin exam normal  Psychiatric: He has a normal mood and affect. His behavior is normal. Judgment and thought content normal.          Assessment & Plan:  Healthy male  Hypertension ago continue current therapy  Reflux esophagitis continue omeprazole 20 mg daily  Allergic rhinitis continue Allegra  Hyperlipidemia continue Zocor and aspirin  New problem of difficulty with gait and balance..... Neurologic consult  Elevated PSA,,,,,,,,,, it was 4.3 hour urine ago now at 6.5 to

## 2014-03-21 NOTE — Progress Notes (Signed)
Pre visit review using our clinic review tool, if applicable. No additional management support is needed unless otherwise documented below in the visit note. 

## 2014-03-21 NOTE — Patient Instructions (Addendum)
Continue current medications  We will get you set up for neurologic evaluation to evaluate your imbalance  I would also recommend you call the urology center for evaluation of your elevated PSA,,,,,,,,, asked to see Dr. Irine Seal

## 2014-03-22 ENCOUNTER — Telehealth: Payer: Self-pay | Admitting: Family Medicine

## 2014-03-22 NOTE — Telephone Encounter (Signed)
emmi mailed  °

## 2014-03-28 ENCOUNTER — Ambulatory Visit (INDEPENDENT_AMBULATORY_CARE_PROVIDER_SITE_OTHER): Payer: Commercial Managed Care - HMO | Admitting: Neurology

## 2014-03-28 ENCOUNTER — Encounter: Payer: Self-pay | Admitting: Neurology

## 2014-03-28 VITALS — BP 140/90 | HR 86 | Ht 67.0 in | Wt 215.4 lb

## 2014-03-28 DIAGNOSIS — R269 Unspecified abnormalities of gait and mobility: Secondary | ICD-10-CM

## 2014-03-28 NOTE — Progress Notes (Signed)
NEUROLOGY CONSULTATION NOTE  Carl Harrell MRN: [id number] DOB: [birth year]  Referring provider: Dr. Sherren Mocha Primary care provider: Dr. Sherren Mocha  Reason for consult:  Abnormal gait  HISTORY OF PRESENT ILLNESS: Carl Harrell is a 68 year old right-handed man with history of hypertension, hyperlipidemia, TIA, esophageal reflux, and allergic rhinitis who presents for abnormal gait.    Over the past 2 years, he has noted some subtle problems with balance.  It has progressed a little over the years.  He notices that his balance is a little off when he plays sports he enjoys, such as bowling or golf.  He says his average in bowling has gone down.  He denies visual disturbance, neck pain, back pain, dizziness, pain down the legs, or numbness in the feet.  He thinks that his legs feel heavy sometimes.  He denies feeling off balance if he walks to the bathroom from bed at night.  He denies bowel or bladder dysfunction except for increased urinary urgency.  He attributes the balance problems to weakness in the legs related to weight gain.  PAST MEDICAL HISTORY: Past Medical History  Diagnosis Date  . ALLERGIC RHINITIS 01/30/2007  . GERD 09/26/2007  . HYPERLIPIDEMIA 09/26/2007  . PLANTAR FASCIITIS, BILATERAL 07/08/2009  . TRANSIENT ISCHEMIC ATTACK, HX OF 01/30/2007  . Unspecified hearing loss 06/16/2009  . ED (erectile dysfunction)   . PVD (peripheral vascular disease)     PAST SURGICAL HISTORY: Past Surgical History  Procedure Laterality Date  . Knee surgery      MEDICATIONS: Current Outpatient Prescriptions on File Prior to Visit  Medication Sig Dispense Refill  . aspirin 81 MG tablet Take 81 mg by mouth daily.      . Cyanocobalamin (VITAMIN B 12 PO) Take by mouth.      . fexofenadine (ALLEGRA) 180 MG tablet Take 180 mg by mouth daily.      Marland Kitchen losartan-hydrochlorothiazide (HYZAAR) 50-12.5 MG per tablet Take 1 tablet by mouth daily. 90 tablet 3  . Multiple Vitamins-Minerals (CENTRUM SILVER PO) Take  by mouth.      Marland Kitchen omeprazole (PRILOSEC) 20 MG capsule take 1 capsule by mouth twice a day 100 capsule 3  . simvastatin (ZOCOR) 40 MG tablet take 1 tablet by mouth at bedtime 90 tablet 3   No current facility-administered medications on file prior to visit.    ALLERGIES: No Known Allergies  FAMILY HISTORY: Family History  Problem Relation Age of Onset  . COPD Neg Hx     family hx  . Hyperlipidemia Neg Hx     family hx    SOCIAL HISTORY: History   Social History  . Marital Status: Married    Spouse Name: N/A    Number of Children: N/A  . Years of Education: N/A   Occupational History  . Not on file.   Social History Main Topics  . Smoking status: Former Research scientist (life sciences)  . Smokeless tobacco: Never Used  . Alcohol Use: 0.0 oz/week    0 Not specified per week  . Drug Use: No  . Sexual Activity: Not on file   Other Topics Concern  . Not on file   Social History Narrative   Lives with wife in a one story home.  Has 2 children.  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  Education: 3 years of college.     REVIEW OF SYSTEMS: Constitutional: No fevers, chills, or sweats, no generalized fatigue, change in appetite Eyes: No visual changes, double vision, eye pain Ear, nose and throat:  No hearing loss, ear pain, nasal congestion, sore throat Cardiovascular: No chest pain, palpitations Respiratory:  No shortness of breath at rest or with exertion, wheezes GastrointestinaI: No nausea, vomiting, diarrhea, abdominal pain, fecal incontinence Genitourinary:  No dysuria, urinary retention or frequency Musculoskeletal:  No neck pain, back pain Integumentary: No rash, pruritus, skin lesions Neurological: as above Psychiatric: No depression, insomnia, anxiety Endocrine: No palpitations, fatigue, diaphoresis, mood swings, change in appetite, change in weight, increased thirst Hematologic/Lymphatic:  No anemia, purpura, petechiae. Allergic/Immunologic: no itchy/runny eyes, nasal congestion, recent allergic  reactions, rashes  PHYSICAL EXAM: Filed Vitals:   03/28/14 1434  BP: 140/90  Pulse: 86   General: No acute distress Head:  Normocephalic/atraumatic Neck: supple, no paraspinal tenderness, full range of motion Back: No paraspinal tenderness Heart: regular rate and rhythm Lungs: Clear to auscultation bilaterally. Vascular: No carotid bruits. Neurological Exam: Mental status: alert and oriented to person, place, and time, recent and remote memory intact, fund of knowledge intact, attention and concentration intact, speech fluent and not dysarthric, language intact. Cranial nerves: CN I: not tested CN II: pupils equal, round and reactive to light, visual fields intact, fundi unremarkable, without vessel changes, exudates, hemorrhages or papilledema. CN III, IV, VI:  full range of motion, no nystagmus, no ptosis CN V: facial sensation intact CN VII: upper and lower face symmetric CN VIII: hearing intact CN IX, X: gag intact, uvula midline CN XI: sternocleidomastoid and trapezius muscles intact CN XII: tongue midline Bulk & Tone: questionable increased tone in the legs (variable), no fasciculations. Motor:  5/5 throughout Sensation: pinprick, proprioception and vibration intact. Deep Tendon Reflexes: 3+ and symmetric in the patellars, otherwise 2+ throughout.  No Hoffman or Babinski sign present. Finger to nose testing:  No dysmetria Heel to shin: no dysmetria Gait:  Normal station and stride.  Able to turn, walk on toes and heels.  Walks in tandem, but not with ease. Romberg with sway.  IMPRESSION: Abnormal gait.  He does seem a little unsteady.  On exam, he has slightly brisk patellar reflexes, although that is not necessarily pathologic.  Sometimes, he seems to have increased tone in the legs, but it may be inability to relax because the right leg sometimes feels normal.  He has no obvious abnormal reflexes, muscle weakness or sensory loss.  PLAN: We will get MRI of the cervical  spine to look for evidence of cervical stenosis.  Further recommendations pending results.  45 minutes spent with patient, over 50% spent discussing possible etiologies and coordinating plan.  Thank you for allowing me to take part in the care of this patient.  Metta Clines, DO  CC:  Stevie Kern, MD

## 2014-03-28 NOTE — Patient Instructions (Addendum)
I can't find any obvious cause of the balance problems on your exam.  However, I would like to check an MRI of the cervical spine to look for any pinching of the spinal cord which can potentially cause balance problems. [facility] 04/15/14 3:45 pm

## 2014-04-15 ENCOUNTER — Ambulatory Visit (HOSPITAL_COMMUNITY)
Admission: RE | Admit: 2014-04-15 | Discharge: 2014-04-15 | Disposition: A | Discharge status: Home / Self Care | Payer: Medicare HMO | Source: Ambulatory Visit | Attending: Neurology | Admitting: Neurology

## 2014-04-15 DIAGNOSIS — R269 Unspecified abnormalities of gait and mobility: Secondary | ICD-10-CM | POA: Diagnosis present

## 2014-04-15 DIAGNOSIS — M4802 Spinal stenosis, cervical region: Secondary | ICD-10-CM | POA: Insufficient documentation

## 2014-04-15 DIAGNOSIS — M8938 Hypertrophy of bone, other site: Secondary | ICD-10-CM | POA: Insufficient documentation

## 2014-04-15 DIAGNOSIS — E041 Nontoxic single thyroid nodule: Secondary | ICD-10-CM | POA: Insufficient documentation

## 2014-04-15 DIAGNOSIS — M5032 Other cervical disc degeneration, mid-cervical region: Secondary | ICD-10-CM | POA: Insufficient documentation

## 2014-04-15 DIAGNOSIS — J398 Other specified diseases of upper respiratory tract: Secondary | ICD-10-CM | POA: Insufficient documentation

## 2014-04-16 ENCOUNTER — Other Ambulatory Visit: Payer: Self-pay | Admitting: *Deleted

## 2014-04-16 ENCOUNTER — Telehealth: Payer: Self-pay | Admitting: *Deleted

## 2014-04-16 DIAGNOSIS — R269 Unspecified abnormalities of gait and mobility: Secondary | ICD-10-CM

## 2014-04-16 NOTE — Telephone Encounter (Signed)
patient is aware of MRI T spine at [facility] on 05/02/14 11:45 am

## 2014-05-02 ENCOUNTER — Ambulatory Visit (HOSPITAL_COMMUNITY)
Admission: RE | Admit: 2014-05-02 | Discharge: 2014-05-02 | Disposition: A | Discharge status: Home / Self Care | Payer: Commercial Managed Care - HMO | Source: Ambulatory Visit | Attending: Neurology | Admitting: Neurology

## 2014-05-02 DIAGNOSIS — R269 Unspecified abnormalities of gait and mobility: Secondary | ICD-10-CM | POA: Diagnosis present

## 2014-05-15 ENCOUNTER — Other Ambulatory Visit: Payer: Self-pay | Admitting: *Deleted

## 2014-05-15 DIAGNOSIS — R269 Unspecified abnormalities of gait and mobility: Secondary | ICD-10-CM

## 2014-05-21 ENCOUNTER — Encounter: Payer: Self-pay | Admitting: Family Medicine

## 2014-05-21 ENCOUNTER — Ambulatory Visit (INDEPENDENT_AMBULATORY_CARE_PROVIDER_SITE_OTHER): Payer: Commercial Managed Care - HMO | Admitting: Family Medicine

## 2014-05-21 VITALS — BP 132/84 | HR 92 | Temp 98.0°F | Ht 67.0 in | Wt 217.2 lb

## 2014-05-21 DIAGNOSIS — R079 Chest pain, unspecified: Secondary | ICD-10-CM

## 2014-05-21 DIAGNOSIS — R0600 Dyspnea, unspecified: Secondary | ICD-10-CM

## 2014-05-21 NOTE — Progress Notes (Signed)
HPI:  Acute visit for:  Chest discomfort: -started about 12 days ago  -intermittent chest discomfort/pressure/tighness (band across bilat chest) and SOB with activity that resolves with rest  -denies: symptoms now, SOB currently, CP currently, nausea, vomiting, acid reflux problems recently, jaw pain, cough, congestion, allergies, palpitations -hx of HLD, HTN and ?TIA (reports had work up with Dr. Erling Cruz and imaging normal) on asa, statin, arb  ROS: See pertinent positives and negatives per HPI.  Past Medical History  Diagnosis Date  . ALLERGIC RHINITIS 01/30/2007  . GERD 09/26/2007  . HYPERLIPIDEMIA 09/26/2007  . PLANTAR FASCIITIS, BILATERAL 07/08/2009  . TRANSIENT ISCHEMIC ATTACK, HX OF 01/30/2007  . Unspecified hearing loss 06/16/2009  . ED (erectile dysfunction)   . PVD (peripheral vascular disease)     Past Surgical History  Procedure Laterality Date  . Knee surgery      Family History  Problem Relation Age of Onset  . COPD Neg Hx     family hx  . Hyperlipidemia Neg Hx     family hx    History   Social History  . Marital Status: Married    Spouse Name: N/A    Number of Children: N/A  . Years of Education: N/A   Social History Main Topics  . Smoking status: Former Research scientist (life sciences)  . Smokeless tobacco: Never Used  . Alcohol Use: 0.0 oz/week    0 Not specified per week  . Drug Use: No  . Sexual Activity: None   Other Topics Concern  . None   Social History Narrative   Lives with wife in a one story home.  Has 2 children.  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  Education: 3 years of college.     Current outpatient prescriptions: aspirin 81 MG tablet, Take 81 mg by mouth daily.  , Disp: , Rfl: ;  Cyanocobalamin (VITAMIN B 12 PO), Take by mouth.  , Disp: , Rfl: ;  fexofenadine (ALLEGRA) 180 MG tablet, Take 180 mg by mouth daily.  , Disp: , Rfl: ;  losartan-hydrochlorothiazide (HYZAAR) 50-12.5 MG per tablet, Take 1 tablet by mouth daily., Disp: 90 tablet, Rfl: 3;  Multiple Vitamins-Minerals  (CENTRUM SILVER PO), Take by mouth.  , Disp: , Rfl:  omeprazole (PRILOSEC) 20 MG capsule, take 1 capsule by mouth twice a day, Disp: 100 capsule, Rfl: 3;  simvastatin (ZOCOR) 40 MG tablet, take 1 tablet by mouth at bedtime, Disp: 90 tablet, Rfl: 3  EXAM:  Filed Vitals:   05/21/14 1510  BP: 132/84  Pulse: 92  Temp: 98 F (36.7 C)    Body mass index is 34.01 kg/(m^2).  GENERAL: vitals reviewed and listed above, alert, oriented, appears well hydrated and in no acute distress  HEENT: atraumatic, conjunttiva clear, no obvious abnormalities on inspection of external nose and ears  NECK: no obvious masses on inspection  LUNGS: clear to auscultation bilaterally, no wheezes, rales or rhonchi, good air movement  CV: HRRR, no peripheral edema  MS: moves all extremities without noticeable abnormality, no TTP on exam  PSYCH: pleasant and cooperative, no obvious depression or anxiety  ASSESSMENT AND PLAN:  Discussed the following assessment and plan:  Dyspnea - Plan: EKG 12-Lead  Chest pain, unspecified chest pain type  -exertional CP and SOB, no acute findings on EKG, no symptoms currently -discussed with Dr. Radford Pax physician on call for cardiology and she reviewed EKG and advised her assistant will set him up for eval tomorrow -spoke with cards assistant and they will call pt with appt  details -ED precautions discussed -Patient advised to return or notify a doctor immediately if symptoms worsen or persist or new concerns arise.  Patient Instructions  The cardiology office will be calling you for an evaluation as soon as possible and likely tomorrow  Please seek emergency care immediately if persistent symptoms, CP, or worsening     Jalal Rauch R.

## 2014-05-21 NOTE — Progress Notes (Signed)
Pre visit review using our clinic review tool, if applicable. No additional management support is needed unless otherwise documented below in the visit note. 

## 2014-05-21 NOTE — Patient Instructions (Signed)
The cardiology office will be calling you for an evaluation as soon as possible and likely tomorrow  Please seek emergency care immediately if persistent symptoms, CP, or worsening

## 2014-05-22 ENCOUNTER — Encounter: Payer: Self-pay | Admitting: Cardiology

## 2014-05-22 ENCOUNTER — Ambulatory Visit (INDEPENDENT_AMBULATORY_CARE_PROVIDER_SITE_OTHER): Payer: Commercial Managed Care - HMO | Admitting: Cardiology

## 2014-05-22 VITALS — BP 122/82 | HR 79 | Ht 67.0 in | Wt 215.0 lb

## 2014-05-22 DIAGNOSIS — R079 Chest pain, unspecified: Secondary | ICD-10-CM

## 2014-05-22 DIAGNOSIS — Z01812 Encounter for preprocedural laboratory examination: Secondary | ICD-10-CM

## 2014-05-22 DIAGNOSIS — E781 Pure hyperglyceridemia: Secondary | ICD-10-CM

## 2014-05-22 DIAGNOSIS — I10 Essential (primary) hypertension: Secondary | ICD-10-CM

## 2014-05-22 LAB — CBC WITH DIFFERENTIAL/PLATELET
BASOS ABS: 0.1 10*3/uL (ref 0.0–0.1)
BASOS PCT: 0.4 % (ref 0.0–3.0)
Eosinophils Absolute: 0.9 10*3/uL — ABNORMAL HIGH (ref 0.0–0.7)
Eosinophils Relative: 6.7 % — ABNORMAL HIGH (ref 0.0–5.0)
HCT: 48.5 % (ref 39.0–52.0)
Hemoglobin: 15.7 g/dL (ref 13.0–17.0)
LYMPHS PCT: 26.2 % (ref 12.0–46.0)
Lymphs Abs: 3.4 10*3/uL (ref 0.7–4.0)
MCHC: 32.4 g/dL (ref 30.0–36.0)
MCV: 84.6 fl (ref 78.0–100.0)
Monocytes Absolute: 1 10*3/uL (ref 0.1–1.0)
Monocytes Relative: 7.8 % (ref 3.0–12.0)
Neutro Abs: 7.6 10*3/uL (ref 1.4–7.7)
Neutrophils Relative %: 58.9 % (ref 43.0–77.0)
Platelets: 288 10*3/uL (ref 150.0–400.0)
RBC: 5.74 Mil/uL (ref 4.22–5.81)
RDW: 13.7 % (ref 11.5–15.5)
WBC: 13 10*3/uL — AB (ref 4.0–10.5)

## 2014-05-22 LAB — PROTIME-INR
INR: 1 ratio (ref 0.8–1.0)
PROTHROMBIN TIME: 11.5 s (ref 9.6–13.1)

## 2014-05-22 LAB — CARDIAC PANEL
CK-MB: 3 ng/mL (ref 0.3–4.0)
Relative Index: 2.5 calc (ref 0.0–2.5)
Total CK: 122 U/L (ref 7–232)

## 2014-05-22 LAB — BASIC METABOLIC PANEL
BUN: 16 mg/dL (ref 6–23)
CALCIUM: 9.3 mg/dL (ref 8.4–10.5)
CHLORIDE: 102 meq/L (ref 96–112)
CO2: 26 mEq/L (ref 19–32)
CREATININE: 1.1 mg/dL (ref 0.4–1.5)
GFR: 69.43 mL/min (ref >60.00)
GLUCOSE: 87 mg/dL (ref 70–99)
Potassium: 4 mEq/L (ref 3.5–5.1)
Sodium: 138 mEq/L (ref 135–145)

## 2014-05-22 NOTE — Progress Notes (Signed)
[address], [place], [place]  [address] Phone: [phone] Fax:  [phone]  Date:  05/22/2014   ID:  Carl Harrell, DOB Jul 07, 1945, MRN [id number]  PCP:  Joycelyn Man, MD  Cardiologist:  Fransico Him, MD    History of Present Illness: Carl Harrell is a 68 y.o. male with a history of GERD, dyslipidemia, TIA and PVD who presents today for evaluation of chest pain.  He says that the CP has been occurring for about 1.5 weeks.  It occurs only when he exerts himself.  He has never had any pain at rest.  He describes it as a tightness and belt across his chest.  He gets SOB with the discomfort and has to stop and rest.  He denies any nausea but may get a little diaphoretic.  It usually lasts a few seconds and resolves as soon as he rests.  He denies any LE edema, palpitations, dizziness or syncope.     Wt Readings from Last 3 Encounters:  05/22/14 215 lb (97.523 kg)  05/21/14 217 lb 3.2 oz (98.521 kg)  03/28/14 215 lb 7 oz (97.722 kg)     Past Medical History  Diagnosis Date  . ALLERGIC RHINITIS 01/30/2007  . GERD 09/26/2007  . HYPERLIPIDEMIA 09/26/2007  . PLANTAR FASCIITIS, BILATERAL 07/08/2009  . TRANSIENT ISCHEMIC ATTACK, HX OF 01/30/2007  . Unspecified hearing loss 06/16/2009  . ED (erectile dysfunction)     Current Outpatient Prescriptions  Medication Sig Dispense Refill  . aspirin 81 MG tablet Take 81 mg by mouth daily.      . Cyanocobalamin (VITAMIN B 12 PO) Take by mouth.      . fexofenadine (ALLEGRA) 180 MG tablet Take 180 mg by mouth daily.      Marland Kitchen losartan-hydrochlorothiazide (HYZAAR) 50-12.5 MG per tablet Take 1 tablet by mouth daily. 90 tablet 3  . Multiple Vitamins-Minerals (CENTRUM SILVER PO) Take by mouth.      Marland Kitchen omeprazole (PRILOSEC) 20 MG capsule take 1 capsule by mouth twice a day 100 capsule 3  . simvastatin (ZOCOR) 40 MG tablet take 1 tablet by mouth at bedtime 90 tablet 3   No current facility-administered medications for this visit.     Allergies:   No Known Allergies  Social History:  The patient  reports that he has quit smoking. He has never used smokeless tobacco. He reports that he drinks alcohol. He reports that he does not use illicit drugs.   Family History:  The patient's family history includes Heart attack in his father and mother; Heart disease in his father and mother. There is no history of COPD or Hyperlipidemia.   ROS:  Please see the history of present illness.      All other systems reviewed and negative.   PHYSICAL EXAM: VS:  BP 122/82 mmHg  Pulse 79  Ht 5\' 7"  (1.702 m)  Wt 215 lb (97.523 kg)  BMI 33.67 kg/m2 Well nourished, well developed, in no acute distress HEENT: normal Neck: no JVD Cardiac:  normal S1, S2; RRR; no murmur Lungs:  clear to auscultation bilaterally, no wheezing, rhonchi or rales Abd: soft, nontender, no hepatomegaly Ext: no edema Skin: warm and dry Neuro:  CNs 2-12 intact, no focal abnormalities noted  EKG:  NSR with possible inferior infarct age undetermined and nonspecific T wave abnormality  ASSESSMENT AND PLAN:  1. Chest pain worrisome for ischemia.  He says that he has to walk at least 1/2 block to get symptoms.  He has not had any rest pain.  His EKG shows ? Old inferior MI.  I will check a CPK and trop today.  I will set him up for stress myoview in the am to assess for ischemia.  I will also check a 2D echo to assess LVF.  Continue ASA and statin.  I have instructed him to call 911 and go to ER if he gets any pain that does not resolve with rest.  2. HTN well controlled.  Continue Hyzaar 3. Dyslipidemia 4. DOE  Followup after results of studies  Signed, Fransico Him, MD [facility] 05/22/2014 2:40 PM

## 2014-05-22 NOTE — Patient Instructions (Signed)
Your physician recommends that you have lab work TODAY (BMET, CK, Trop, CBC, PT/INR).  Your physician has requested that you have an echocardiogram. Echocardiography is a painless test that uses sound waves to create images of your heart. It provides your doctor with information about the size and shape of your heart and how well your heart's chambers and valves are working. This procedure takes approximately one hour. There are no restrictions for this procedure.  Dr. Radford Pax recommends you have a STRESS MYOVIEW.  Call 911 and go to the ER if you get any pain that does not resolve with rest!  Your physician recommends that you schedule a follow-up appointment pending test results.

## 2014-05-23 ENCOUNTER — Ambulatory Visit (HOSPITAL_BASED_OUTPATIENT_CLINIC_OR_DEPARTMENT_OTHER): Payer: Commercial Managed Care - HMO | Admitting: Radiology

## 2014-05-23 ENCOUNTER — Ambulatory Visit (HOSPITAL_COMMUNITY): Payer: Commercial Managed Care - HMO | Attending: Cardiovascular Disease | Admitting: Radiology

## 2014-05-23 DIAGNOSIS — I739 Peripheral vascular disease, unspecified: Secondary | ICD-10-CM | POA: Insufficient documentation

## 2014-05-23 DIAGNOSIS — R0600 Dyspnea, unspecified: Secondary | ICD-10-CM | POA: Insufficient documentation

## 2014-05-23 DIAGNOSIS — I10 Essential (primary) hypertension: Secondary | ICD-10-CM | POA: Diagnosis not present

## 2014-05-23 DIAGNOSIS — R079 Chest pain, unspecified: Secondary | ICD-10-CM | POA: Diagnosis not present

## 2014-05-23 DIAGNOSIS — Z01812 Encounter for preprocedural laboratory examination: Secondary | ICD-10-CM

## 2014-05-23 MED ORDER — REGADENOSON 0.4 MG/5ML IV SOLN
0.4000 mg | Freq: Once | INTRAVENOUS | Status: AC
Start: 2014-05-23 — End: 2014-05-23
  Administered 2014-05-23: 0.4 mg via INTRAVENOUS

## 2014-05-23 MED ORDER — TECHNETIUM TC 99M SESTAMIBI GENERIC - CARDIOLITE
10.0000 | Freq: Once | INTRAVENOUS | Status: AC | PRN
  Administered 2014-05-23: 10 via INTRAVENOUS

## 2014-05-23 MED ORDER — TECHNETIUM TC 99M SESTAMIBI GENERIC - CARDIOLITE
30.0000 | Freq: Once | INTRAVENOUS | Status: AC | PRN
  Administered 2014-05-23: 30 via INTRAVENOUS

## 2014-05-23 NOTE — Progress Notes (Signed)
Echocardiogram performed.  

## 2014-05-23 NOTE — Progress Notes (Signed)
[facility] [address] [place], [place] [address] [phone]    Cardiology Nuclear Med Study  Carl Harrell is a 68 y.o. male     MRN : [id number]     DOB: 03/13/1946  Procedure Date: 05/23/2014  Nuclear Med Background Indication for Stress Test:  Evaluation for Ischemia History:  n/a Cardiac Risk Factors: Hypertension, Lipids and PVD  Symptoms:  Chest Tightness (last date of chest discomfort -), DOE and SOB   Nuclear Pre-Procedure Caffeine/Decaff Intake:  None NPO After: 8:00pm   Lungs:  clear O2 Sat: 95% on room air. IV 0.9% NS with Angio Cath:  22g  IV Site: R Antecubital  IV Started by:  Perrin Maltese, EMT-P  Chest Size (in):  42 Cup Size: n/a  Height: 5\' 7"  (1.702 m)  Weight:  210 lb (95.255 kg)  BMI:  Body mass index is 32.88 kg/(m^2). Tech Comments:  No Rx this am    Nuclear Med Study 1 or 2 day study: 1 day  Stress Test Type:  Carlton Adam  Reading MD: n/a  Order Authorizing Provider:  T.Montrel Donahoe MD  Resting Radionuclide: Technetium 58m Sestamibi  Resting Radionuclide Dose: 11.0 mCi   Stress Radionuclide:  Technetium 15m Sestamibi  Stress Radionuclide Dose: 33.0 mCi           Stress Protocol Rest HR: 70 Stress HR: 115  Rest BP: 128/88 Stress BP: 140/76  Exercise Time (min): n/a METS: n/a   Predicted Max HR: 150 bpm % Max HR: 76.67 bpm Rate Pressure Product: 18400   Dose of Adenosine (mg):  n/a Dose of Lexiscan: 0.4 mg  Dose of Atropine (mg): n/a Dose of Dobutamine: n/a mcg/kg/min (at max HR)  Stress Test Technologist: Glade Lloyd, BS-ES  Nuclear Technologist:  Annye Rusk, CNMT     Rest Procedure:  Myocardial perfusion imaging was performed at rest 45 minutes following the intravenous administration of Technetium 78m Sestamibi. Rest ECG: NSR - Normal EKG  Stress Procedure:  The patient received IV Lexiscan 0.4 mg over 15-seconds.  Technetium 50m Sestamibi injected at 30-seconds.  Quantitative spect images were obtained  after a 45 minute delay.  Attempted to walk patient on Bruce Protocol but he became SOB with slight chest tightness.  He did not feel he could continue due to fatigue and SOB.  He seemed very anxious.  Changed him to a sitting Lexiscan.  During the infusion the patient complained of chest heaviness, SOB, anxiety, right arm feeling heavy and he had significant belching.  These symptoms resolved in recovery.  Stress ECG: No significant change from baseline ECG  QPS Raw Data Images:  Mild diaphragmatic attenuation.  Normal left ventricular size. Stress Images:  There is decreased uptake in the inferior, inferolateral and apcial walls. Rest Images:  There is decreased uptake in the inferior, inferolateral and apical walls Subtraction (SDS):  There is a medium sized, moderate defect in the mid and basal inferior and inferolateral walls that is partially fixed with some reversibility consistent with ischemia. There is a  small in size, mild fixed defect in the apex. Transient Ischemic Dilatation (Normal <1.22):  0.94 Lung/Heart Ratio (Normal <0.45):  0.27  Quantitative Gated Spect Images QGS EDV:  78 ml QGS ESV:  30 ml  Impression Exercise Capacity:  Lexiscan with no exercise. BP Response:  Hypotensive blood pressure response. Clinical Symptoms:  Typical chest pain. ECG Impression:  No significant ST segment change suggestive of ischemia. Comparison with Prior Nuclear Study:  No images to compare  Overall Impression:  High risk stress nuclear study: There is a medium sized, moderate defect in the mid and basal inferior and inferolateral walls that is partially fixed with some reversibility consistent with ischemia. There is a  small in size, mild fixed defect in the apex..  LV Ejection Fraction: 62%.  LV Wall Motion:  NL LV Function; NL Wall Motion  Signed: Fransico Him, MD [facility] 05/23/2014

## 2014-05-28 ENCOUNTER — Other Ambulatory Visit: Payer: Self-pay | Admitting: Cardiology

## 2014-05-28 ENCOUNTER — Ambulatory Visit (INDEPENDENT_AMBULATORY_CARE_PROVIDER_SITE_OTHER): Payer: Commercial Managed Care - HMO | Admitting: Cardiology

## 2014-05-28 ENCOUNTER — Encounter: Payer: Self-pay | Admitting: Cardiology

## 2014-05-28 VITALS — BP 122/84 | HR 82 | Ht 67.0 in | Wt 217.0 lb

## 2014-05-28 DIAGNOSIS — R079 Chest pain, unspecified: Secondary | ICD-10-CM

## 2014-05-28 DIAGNOSIS — I10 Essential (primary) hypertension: Secondary | ICD-10-CM | POA: Diagnosis not present

## 2014-05-28 DIAGNOSIS — Z01812 Encounter for preprocedural laboratory examination: Secondary | ICD-10-CM

## 2014-05-28 DIAGNOSIS — R9439 Abnormal result of other cardiovascular function study: Secondary | ICD-10-CM | POA: Diagnosis not present

## 2014-05-28 LAB — BASIC METABOLIC PANEL
BUN: 12 mg/dL (ref 6–23)
CHLORIDE: 102 meq/L (ref 96–112)
CO2: 28 mEq/L (ref 19–32)
Calcium: 9.4 mg/dL (ref 8.4–10.5)
Creatinine, Ser: 1.1 mg/dL (ref 0.4–1.5)
GFR: 74.02 mL/min (ref >60.00)
GLUCOSE: 121 mg/dL — AB (ref 70–99)
POTASSIUM: 3.6 meq/L (ref 3.5–5.1)
Sodium: 137 mEq/L (ref 135–145)

## 2014-05-28 LAB — PROTIME-INR
INR: 1 ratio (ref 0.8–1.0)
PROTHROMBIN TIME: 11.5 s (ref 9.6–13.1)

## 2014-05-28 LAB — CBC WITH DIFFERENTIAL/PLATELET
BASOS ABS: 0 10*3/uL (ref 0.0–0.1)
Basophils Relative: 0.3 % (ref 0.0–3.0)
EOS ABS: 1.2 10*3/uL — AB (ref 0.0–0.7)
Eosinophils Relative: 10.7 % — ABNORMAL HIGH (ref 0.0–5.0)
HCT: 47.2 % (ref 39.0–52.0)
Hemoglobin: 15.1 g/dL (ref 13.0–17.0)
LYMPHS PCT: 27.2 % (ref 12.0–46.0)
Lymphs Abs: 3 10*3/uL (ref 0.7–4.0)
MCHC: 31.9 g/dL (ref 30.0–36.0)
MCV: 84.7 fl (ref 78.0–100.0)
MONOS PCT: 9.4 % (ref 3.0–12.0)
Monocytes Absolute: 1 10*3/uL (ref 0.1–1.0)
Neutro Abs: 5.8 10*3/uL (ref 1.4–7.7)
Neutrophils Relative %: 52.4 % (ref 43.0–77.0)
Platelets: 268 10*3/uL (ref 150.0–400.0)
RBC: 5.57 Mil/uL (ref 4.22–5.81)
RDW: 14.1 % (ref 11.5–15.5)
WBC: 11.1 10*3/uL — ABNORMAL HIGH (ref 4.0–10.5)

## 2014-05-28 NOTE — Interval H&P Note (Signed)
Cath Lab Visit (complete for each Cath Lab visit)  Clinical Evaluation Leading to the Procedure:   ACS: No.  Non-ACS:    Anginal Classification: CCS III  Anti-ischemic medical therapy: Maximal Therapy (2 or more classes of medications)  Non-Invasive Test Results: High-risk stress test findings: cardiac mortality >3%/year  Prior CABG: No previous CABG      History and Physical Interval Note:  05/28/2014 9:04 PM  Carl Harrell  has presented today for surgery, with the diagnosis of abnormal mioview  The various methods of treatment have been discussed with the patient and family. After consideration of risks, benefits and other options for treatment, the patient has consented to  Procedure(s): LEFT HEART CATHETERIZATION WITH CORONARY ANGIOGRAM (N/A) as a surgical intervention .  The patient's history has been reviewed, patient examined, no change in status, stable for surgery.  I have reviewed the patient's chart and labs.  Questions were answered to the patient's satisfaction.     Sinclair Grooms

## 2014-05-28 NOTE — Patient Instructions (Addendum)
Your physician has requested that you have a cardiac catheterization TOMORROW, 05/29/2014. Cardiac catheterization is used to diagnose and/or treat various heart conditions. Doctors may recommend this procedure for a number of different reasons. The most common reason is to evaluate chest pain. Chest pain can be a symptom of coronary artery disease (CAD), and cardiac catheterization can show whether plaque is narrowing or blocking your heart's arteries. This procedure is also used to evaluate the valves, as well as measure the blood flow and oxygen levels in different parts of your heart. For further information please visit [email or web address]. Please follow instruction sheet, as given.  Your physician recommends that you have lab work TODAY (BMET, CBC, PT)  Your physician recommends that you schedule a follow-up appointment AS NEEDED pending your catheterization.

## 2014-05-28 NOTE — H&P (View-Only) (Signed)
[place], [address], [place]  [address] Phone: [phone] Fax:  [phone]  Date:  05/28/2014   ID:  Carl Harrell, DOB 1946-01-28, MRN [id number]  PCP:  Joycelyn Man, MD  Cardiologist:  Fransico Him, MD    History of Present Illness: Carl Harrell is a 69 y.o. male with a history of GERD, dyslipidemia, TIA and PVD who presents today for followup of chest pain. He says that the CP occurs only when he exerts himself. He has never had any pain at rest. He describes it as a tightness and belt across his chest. He gets SOB with the discomfort and has to stop and rest. He denies any nausea but may get a little diaphoretic. It usually lasts a few seconds and resolves as soon as he rests. He denies any LE edema, palpitations, dizziness or syncope. He underwent nuclear stress test showing a medium sized, moderate defect in the mid and basal inferior and inferolateral walls that is partially fixed with some reversibility consistent with ischemia. There was asmall in size, mild fixed defect in the apex.  He now presents today for followup to discuss results of stress test.  Wt Readings from Last 3 Encounters:  05/28/14 217 lb (98.431 kg)  05/23/14 210 lb (95.255 kg)  05/22/14 215 lb (97.523 kg)     Past Medical History  Diagnosis Date  . ALLERGIC RHINITIS 01/30/2007  . GERD 09/26/2007  . HYPERLIPIDEMIA 09/26/2007  . PLANTAR FASCIITIS, BILATERAL 07/08/2009  . TRANSIENT ISCHEMIC ATTACK, HX OF 01/30/2007  . Unspecified hearing loss 06/16/2009  . ED (erectile dysfunction)     Current Outpatient Prescriptions  Medication Sig Dispense Refill  . aspirin 81 MG tablet Take 81 mg by mouth daily.      . Cyanocobalamin (VITAMIN B 12 PO) Take by mouth.      . losartan-hydrochlorothiazide (HYZAAR) 50-12.5 MG per tablet Take 1 tablet by mouth daily. 90 tablet 3  . Multiple Vitamins-Minerals (CENTRUM SILVER PO) Take by mouth daily.     Marland Kitchen omeprazole (PRILOSEC) 20 MG capsule take 1  capsule by mouth twice a day (Patient taking differently: 2 (two) times daily before a meal. take 1 capsule by mouth twice a day) 100 capsule 3  . simvastatin (ZOCOR) 40 MG tablet take 1 tablet by mouth at bedtime 90 tablet 3   No current facility-administered medications for this visit.    Allergies:   No Known Allergies  Social History:  The patient  reports that he has quit smoking. He has never used smokeless tobacco. He reports that he drinks alcohol. He reports that he does not use illicit drugs.   Family History:  The patient's family history includes Heart attack in his father and mother; Heart disease in his father and mother. There is no history of COPD or Hyperlipidemia.   ROS:  Please see the history of present illness.      All other systems reviewed and negative.   PHYSICAL EXAM: VS:  BP 122/84 mmHg  Pulse 82  Ht 5\' 7"  (1.702 m)  Wt 217 lb (98.431 kg)  BMI 33.98 kg/m2 Well nourished, well developed, in no acute distress HEENT: normal Neck: no JVD Cardiac:  normal S1, S2; RRR; no murmur Lungs:  clear to auscultation bilaterally, no wheezing, rhonchi or rales Abd: soft, nontender, no hepatomegaly Ext: no edema Skin: warm and dry Neuro:  CNs 2-12 intact, no focal abnormalities noted  ASSESSMENT AND PLAN:  1.  Chest pain  worrisome for ischemia. He says that he has to walk at least 1/2 block to get symptoms. He has not had any rest pain.His nuclear stress test was high risk with a medium sized, moderate defect in the mid and basal inferior and inferolateral walls that is partially fixed with some reversibility consistent with ischemia. There is a small in size, mild fixed defect in the apex.  I have recommended proceeding with cardiac cath.  Cardiac catheterization was discussed with the patient fully including risks on myocardial infarction, death, stroke, bleeding, arrhythmia, dye allergy, renal insufficiency or bleeding.  All patient questions and concerns were  discussed and the patient understands and is willing to proceed.  Continue ASA.  Hold Losartan/HCTZ day of cath 2.  HTN well controlled. Continue Hyzaar 3.  Dyslipidemia   4.  DOE      Followup after cath   Signed, Fransico Him, MD [facility] 05/28/2014 1:36 PM

## 2014-05-28 NOTE — Progress Notes (Signed)
[place], [address], [place]  [address] Phone: [phone] Fax:  [phone]  Date:  05/28/2014   ID:  NICKOLI BAGHERI, DOB 1945-09-27, MRN [id number]  PCP:  Joycelyn Man, MD  Cardiologist:  Fransico Him, MD    History of Present Illness: Carl Harrell is a 69 y.o. male with a history of GERD, dyslipidemia, TIA and PVD who presents today for followup of chest pain. He says that the CP occurs only when he exerts himself. He has never had any pain at rest. He describes it as a tightness and belt across his chest. He gets SOB with the discomfort and has to stop and rest. He denies any nausea but may get a little diaphoretic. It usually lasts a few seconds and resolves as soon as he rests. He denies any LE edema, palpitations, dizziness or syncope. He underwent nuclear stress test showing a medium sized, moderate defect in the mid and basal inferior and inferolateral walls that is partially fixed with some reversibility consistent with ischemia. There was asmall in size, mild fixed defect in the apex.  He now presents today for followup to discuss results of stress test.  Wt Readings from Last 3 Encounters:  05/28/14 217 lb (98.431 kg)  05/23/14 210 lb (95.255 kg)  05/22/14 215 lb (97.523 kg)     Past Medical History  Diagnosis Date  . ALLERGIC RHINITIS 01/30/2007  . GERD 09/26/2007  . HYPERLIPIDEMIA 09/26/2007  . PLANTAR FASCIITIS, BILATERAL 07/08/2009  . TRANSIENT ISCHEMIC ATTACK, HX OF 01/30/2007  . Unspecified hearing loss 06/16/2009  . ED (erectile dysfunction)     Current Outpatient Prescriptions  Medication Sig Dispense Refill  . aspirin 81 MG tablet Take 81 mg by mouth daily.      . Cyanocobalamin (VITAMIN B 12 PO) Take by mouth.      . losartan-hydrochlorothiazide (HYZAAR) 50-12.5 MG per tablet Take 1 tablet by mouth daily. 90 tablet 3  . Multiple Vitamins-Minerals (CENTRUM SILVER PO) Take by mouth daily.     Marland Kitchen omeprazole (PRILOSEC) 20 MG capsule take 1  capsule by mouth twice a day (Patient taking differently: 2 (two) times daily before a meal. take 1 capsule by mouth twice a day) 100 capsule 3  . simvastatin (ZOCOR) 40 MG tablet take 1 tablet by mouth at bedtime 90 tablet 3   No current facility-administered medications for this visit.    Allergies:   No Known Allergies  Social History:  The patient  reports that he has quit smoking. He has never used smokeless tobacco. He reports that he drinks alcohol. He reports that he does not use illicit drugs.   Family History:  The patient's family history includes Heart attack in his father and mother; Heart disease in his father and mother. There is no history of COPD or Hyperlipidemia.   ROS:  Please see the history of present illness.      All other systems reviewed and negative.   PHYSICAL EXAM: VS:  BP 122/84 mmHg  Pulse 82  Ht 5\' 7"  (1.702 m)  Wt 217 lb (98.431 kg)  BMI 33.98 kg/m2 Well nourished, well developed, in no acute distress HEENT: normal Neck: no JVD Cardiac:  normal S1, S2; RRR; no murmur Lungs:  clear to auscultation bilaterally, no wheezing, rhonchi or rales Abd: soft, nontender, no hepatomegaly Ext: no edema Skin: warm and dry Neuro:  CNs 2-12 intact, no focal abnormalities noted  ASSESSMENT AND PLAN:  1.  Chest pain  worrisome for ischemia. He says that he has to walk at least 1/2 block to get symptoms. He has not had any rest pain.His nuclear stress test was high risk with a medium sized, moderate defect in the mid and basal inferior and inferolateral walls that is partially fixed with some reversibility consistent with ischemia. There is a small in size, mild fixed defect in the apex.  I have recommended proceeding with cardiac cath.  Cardiac catheterization was discussed with the patient fully including risks on myocardial infarction, death, stroke, bleeding, arrhythmia, dye allergy, renal insufficiency or bleeding.  All patient questions and concerns were  discussed and the patient understands and is willing to proceed.  Continue ASA.  Hold Losartan/HCTZ day of cath 2.  HTN well controlled. Continue Hyzaar 3.  Dyslipidemia   4.  DOE      Followup after cath   Signed, Fransico Him, MD [facility] 05/28/2014 1:36 PM

## 2014-05-29 ENCOUNTER — Encounter (HOSPITAL_COMMUNITY): Payer: Self-pay | Admitting: General Practice

## 2014-05-29 ENCOUNTER — Encounter (HOSPITAL_COMMUNITY)
Admission: RE | Disposition: A | Discharge status: Home / Self Care | Payer: Commercial Managed Care - HMO | Source: Ambulatory Visit | Attending: Interventional Cardiology

## 2014-05-29 ENCOUNTER — Ambulatory Visit (HOSPITAL_COMMUNITY)
Admission: RE | Admit: 2014-05-29 | Discharge: 2014-05-30 | Disposition: A | Discharge status: Home / Self Care | Payer: Commercial Managed Care - HMO | Source: Ambulatory Visit | Attending: Interventional Cardiology | Admitting: Interventional Cardiology

## 2014-05-29 DIAGNOSIS — Z955 Presence of coronary angioplasty implant and graft: Secondary | ICD-10-CM

## 2014-05-29 DIAGNOSIS — I25118 Atherosclerotic heart disease of native coronary artery with other forms of angina pectoris: Secondary | ICD-10-CM

## 2014-05-29 DIAGNOSIS — I10 Essential (primary) hypertension: Secondary | ICD-10-CM | POA: Diagnosis present

## 2014-05-29 DIAGNOSIS — E785 Hyperlipidemia, unspecified: Secondary | ICD-10-CM | POA: Diagnosis present

## 2014-05-29 DIAGNOSIS — R079 Chest pain, unspecified: Secondary | ICD-10-CM

## 2014-05-29 DIAGNOSIS — I25119 Atherosclerotic heart disease of native coronary artery with unspecified angina pectoris: Secondary | ICD-10-CM | POA: Diagnosis not present

## 2014-05-29 DIAGNOSIS — E781 Pure hyperglyceridemia: Secondary | ICD-10-CM | POA: Diagnosis not present

## 2014-05-29 DIAGNOSIS — I209 Angina pectoris, unspecified: Secondary | ICD-10-CM | POA: Diagnosis present

## 2014-05-29 DIAGNOSIS — Z7982 Long term (current) use of aspirin: Secondary | ICD-10-CM | POA: Insufficient documentation

## 2014-05-29 DIAGNOSIS — Z87891 Personal history of nicotine dependence: Secondary | ICD-10-CM | POA: Insufficient documentation

## 2014-05-29 DIAGNOSIS — Z8673 Personal history of transient ischemic attack (TIA), and cerebral infarction without residual deficits: Secondary | ICD-10-CM | POA: Insufficient documentation

## 2014-05-29 DIAGNOSIS — K219 Gastro-esophageal reflux disease without esophagitis: Secondary | ICD-10-CM | POA: Diagnosis not present

## 2014-05-29 HISTORY — DX: Atherosclerotic heart disease of native coronary artery without angina pectoris: I25.10

## 2014-05-29 HISTORY — DX: Unspecified osteoarthritis, unspecified site: M19.90

## 2014-05-29 HISTORY — PX: CARDIAC CATHETERIZATION: SHX172

## 2014-05-29 HISTORY — DX: Essential (primary) hypertension: I10

## 2014-05-29 HISTORY — PX: LEFT HEART CATHETERIZATION WITH CORONARY ANGIOGRAM: SHX5451

## 2014-05-29 HISTORY — PX: CORONARY ANGIOPLASTY WITH STENT PLACEMENT: SHX49

## 2014-05-29 LAB — POCT ACTIVATED CLOTTING TIME: Activated Clotting Time: 558 seconds

## 2014-05-29 SURGERY — LEFT HEART CATHETERIZATION WITH CORONARY ANGIOGRAM

## 2014-05-29 MED ORDER — HEPARIN (PORCINE) IN NACL 2-0.9 UNIT/ML-% IJ SOLN
INTRAMUSCULAR | Status: AC
  Filled 2014-05-29: qty 1000

## 2014-05-29 MED ORDER — METOPROLOL TARTRATE 25 MG PO TABS
25.0000 mg | ORAL_TABLET | Freq: Two times a day (BID) | ORAL | Status: DC
  Administered 2014-05-29 – 2014-05-30 (×2): 25 mg via ORAL
  Filled 2014-05-29 (×4): qty 1

## 2014-05-29 MED ORDER — SODIUM CHLORIDE 0.9 % IV SOLN
INTRAVENOUS | Status: DC
  Administered 2014-05-29: 10:00:00 via INTRAVENOUS

## 2014-05-29 MED ORDER — LOSARTAN POTASSIUM-HCTZ 50-12.5 MG PO TABS: 1.0000 | ORAL_TABLET | Freq: Every day | ORAL | Status: DC

## 2014-05-29 MED ORDER — TICAGRELOR 90 MG PO TABS
ORAL_TABLET | ORAL | Status: AC
  Filled 2014-05-29: qty 1

## 2014-05-29 MED ORDER — ACETAMINOPHEN 325 MG PO TABS: 650.0000 mg | ORAL_TABLET | ORAL | Status: DC | PRN

## 2014-05-29 MED ORDER — SODIUM CHLORIDE 0.9 % IJ SOLN: 3.0000 mL | Freq: Two times a day (BID) | INTRAMUSCULAR | Status: DC

## 2014-05-29 MED ORDER — LIDOCAINE HCL (PF) 1 % IJ SOLN
INTRAMUSCULAR | Status: AC
  Filled 2014-05-29: qty 30

## 2014-05-29 MED ORDER — SODIUM CHLORIDE 0.9 % IV SOLN: INTRAVENOUS | Status: AC

## 2014-05-29 MED ORDER — SIMVASTATIN 40 MG PO TABS
40.0000 mg | ORAL_TABLET | Freq: Every day | ORAL | Status: DC
  Administered 2014-05-29: 22:00:00 40 mg via ORAL
  Filled 2014-05-29 (×2): qty 1

## 2014-05-29 MED ORDER — MORPHINE SULFATE 2 MG/ML IJ SOLN: 2.0000 mg | INTRAMUSCULAR | Status: DC | PRN

## 2014-05-29 MED ORDER — SODIUM CHLORIDE 0.9 % IV SOLN: 250.0000 mL | INTRAVENOUS | Status: DC | PRN

## 2014-05-29 MED ORDER — PANTOPRAZOLE SODIUM 40 MG PO TBEC
40.0000 mg | DELAYED_RELEASE_TABLET | Freq: Every day | ORAL | Status: DC
  Administered 2014-05-30: 40 mg via ORAL
  Filled 2014-05-29: qty 1

## 2014-05-29 MED ORDER — FENTANYL CITRATE 0.05 MG/ML IJ SOLN
INTRAMUSCULAR | Status: AC
  Filled 2014-05-29: qty 2

## 2014-05-29 MED ORDER — HYDROCHLOROTHIAZIDE 12.5 MG PO CAPS
12.5000 mg | ORAL_CAPSULE | Freq: Every day | ORAL | Status: DC
  Administered 2014-05-30: 11:00:00 12.5 mg via ORAL
  Filled 2014-05-29: qty 1

## 2014-05-29 MED ORDER — BIVALIRUDIN 250 MG IV SOLR
INTRAVENOUS | Status: AC
  Filled 2014-05-29: qty 250

## 2014-05-29 MED ORDER — ONDANSETRON HCL 4 MG/2ML IJ SOLN: 4.0000 mg | Freq: Four times a day (QID) | INTRAMUSCULAR | Status: DC | PRN

## 2014-05-29 MED ORDER — ASPIRIN 81 MG PO CHEW: 81.0000 mg | CHEWABLE_TABLET | ORAL | Status: DC

## 2014-05-29 MED ORDER — NITROGLYCERIN 1 MG/10 ML FOR IR/CATH LAB
INTRA_ARTERIAL | Status: AC
  Filled 2014-05-29: qty 10

## 2014-05-29 MED ORDER — ASPIRIN 81 MG PO CHEW
81.0000 mg | CHEWABLE_TABLET | Freq: Every day | ORAL | Status: DC
  Administered 2014-05-30: 11:00:00 81 mg via ORAL
  Filled 2014-05-29 (×2): qty 1

## 2014-05-29 MED ORDER — NITROGLYCERIN 0.4 MG SL SUBL: 0.4000 mg | SUBLINGUAL_TABLET | SUBLINGUAL | Status: DC | PRN

## 2014-05-29 MED ORDER — ADULT MULTIVITAMIN W/MINERALS CH
1.0000 | ORAL_TABLET | Freq: Every day | ORAL | Status: DC
  Administered 2014-05-30: 1 via ORAL
  Filled 2014-05-29: qty 1

## 2014-05-29 MED ORDER — LOSARTAN POTASSIUM 50 MG PO TABS
50.0000 mg | ORAL_TABLET | Freq: Every day | ORAL | Status: DC
  Administered 2014-05-30: 11:00:00 50 mg via ORAL
  Filled 2014-05-29: qty 1

## 2014-05-29 MED ORDER — TICAGRELOR 90 MG PO TABS
90.0000 mg | ORAL_TABLET | Freq: Two times a day (BID) | ORAL | Status: DC
  Administered 2014-05-30: 90 mg via ORAL
  Filled 2014-05-29 (×3): qty 1

## 2014-05-29 MED ORDER — SODIUM CHLORIDE 0.9 % IJ SOLN: 3.0000 mL | INTRAMUSCULAR | Status: DC | PRN

## 2014-05-29 MED ORDER — MIDAZOLAM HCL 2 MG/2ML IJ SOLN
INTRAMUSCULAR | Status: AC
  Filled 2014-05-29: qty 2

## 2014-05-29 MED ORDER — OXYCODONE-ACETAMINOPHEN 5-325 MG PO TABS: 1.0000 | ORAL_TABLET | ORAL | Status: DC | PRN

## 2014-05-29 MED ORDER — LORATADINE 10 MG PO TABS
10.0000 mg | ORAL_TABLET | Freq: Every day | ORAL | Status: DC
  Administered 2014-05-30: 11:00:00 10 mg via ORAL
  Filled 2014-05-29: qty 1

## 2014-05-29 NOTE — Care Management Note (Addendum)
  Page 1 of 1   05/29/2014     4:54:37 PM CARE MANAGEMENT NOTE 05/29/2014  Patient:  Carl, Harrell   Account Number:  [id number]  Date Initiated:  05/29/2014  Documentation initiated by:  Mariann Laster  Subjective/Objective Assessment:   angina     Action/Plan:   CM to follow for disposition needs   Anticipated DC Date:  05/30/2014   Anticipated DC Plan:  [place]  CM consult  Medication Assistance      Choice offered to / List presented to:             Status of service:  Completed, signed off Medicare Important Message given?  NO (If response is "NO", the following Medicare IM given date fields will be blank) Date Medicare IM given:   Medicare IM given by:   Date Additional Medicare IM given:   Additional Medicare IM given by:    Discharge Disposition:  HOME/SELF CARE  Per UR Regulation:    If discussed at Long Length of Stay Meetings, dates discussed:    Comments:  Ramsha Lonigro RN, BSN, MSHL, CCM  Nurse - Case Manager,  (Unit [phone]  05/29/2014 Brilinta Benefits check in progress

## 2014-05-29 NOTE — CV Procedure (Signed)
Left Heart Catheterization with Coronary Angiography and PCI Report  Carl Harrell  69 y.o.  male Aug 09, 1945  Procedure Date: 05/29/2014 Referring Physician: Fransico Him, M.D. Primary Cardiologist: Fransico Him, M.D.  INDICATIONS: New onset angina pectoris, class III with high risk myocardial perfusion study demonstrating inferolateral ischemia  PROCEDURE: 1. Left heart catheterization; 2. Coronary angiography; 3. Left ventriculography; 4. DES RCA subtotal occlusion  CONSENT:  The risks, benefits, and details of the procedure were explained in detail to the patient. Risks including death, stroke, heart attack, kidney injury, allergy, limb ischemia, bleeding and radiation injury were discussed.  The patient verbalized understanding and wanted to proceed.  Informed written consent was obtained.  PROCEDURE TECHNIQUE:  After Xylocaine anesthesia a 5 French Slender sheath was placed in the right radial artery with an angiocath and the modified Seldinger technique.  Coronary angiography was done using a 5 F JR 4 and JL 3.5 cm diagnostic catheter, a 6 [place] JR 4 guide catheter, and an XB RCA 6 [place] guide catheter.  Left ventriculography was done using theJR 4 diagnostic catheter and hand injection.    After reviewing the images, the culprit for the patient's abnormal nuclear study is 99% proximal RCA stenosis with TIMI grade 2 flow. The distal vessel is supplied by left-to-right collaterals. There is segmental proximal 50-70% LAD stenosis. Circumflex is widely patent.  After considering the clinical presentation and data he decided to proceed with PCI on the RCA. Obtaining coaxial guide support was difficult with standard catheters. We ultimately use an XB RCA 6 [place] guide catheter. This obtain reasonable guide shots. Bivalirudin bolus and infusion was started. A CT was documented to be therapeutic, greater than 300. 180 mg of Brilinta was given orally. We used a Pro-water guidewire to  advance into the distal right coronary with very little difficulty. We then predilated the proximal stenosis in the RCA  a 2.0 x 15 mm long Emerge balloon catheter. 2 balloon inflations were performed. 200 g intracoronary nitroglycerin was then administered and revealed a large/dominant RCA with PDA and left ventricular branches. We then positioned and deployed a 3.0 x 28 mm long Xience Alpine drug-eluting stent to 14 atm. 2 balloon inflations were performed. We then post dilated the proximal two thirds of the stent with a 3.5 x 15 mm [place] Euphora balloon to 13 atm 2. A nice angiographic result was obtained. TIMI grade 3 flow was noted. No evidence of dissection or other complicating issue was identified.  The case was terminated. The sheath was removed and hemostasis achieved with a wrist band at 12 cc of air.  CONTRAST:  Total of210 cc.  COMPLICATIONS:   None   HEMODYNAMICS:  Aortic pressure 130/80 mmHg; LV pressure 132/12 mmHg; LVEDP 21 mmHg  ANGIOGRAPHIC DATA:   The left main coronary artery is widely patent/normal.  The left anterior descending artery islarge and wraps around the left ventricular apex. He is large into the proximal first diagonal. There is eccentric segmental 50-70% proximal LAD stenosis. The tightest region of narrowing is near the origin of the first septal perforator. The LAD is otherwise widely patent. Collaterals to the distal right coronary and noted filling from the septal perforators.  The left circumflex artery islarge, gives origin to 4 obtuse marginal branches with the second obtuse marginal being the largest of the 4. The second marginal bifurcates on the lateral wall. Collaterals to the distal right coronary and noted filling from the circumflex..  The right coronary artery  issubtotally occluded proximally with a 99% stenosis and TIMI grade 2 flow.Marland Kitchen   PCI RESULTS:  The 99% proximal RCA subtotal occlusion was predilated and stented with a 3.5 mm x 28 mm long  Zience Alpine drug-eluting stent with 0% residual stenosis and TIMI grade 3 flow.  LEFT VENTRICULOGRAM:  Left ventricular angiogram was done in the 30 RAO projection and rebasal hypokinesis involving the inferior wall. Estimated ejection fraction 55%.   IMPRESSIONS:   1. Subtotally occluded native right coronary with 99% proximal stenosis and TIMI grade 2 flow. The distal right coronary territory is supplied by left to right collaterals from both the LAD and circumflex. 2. Successful PTCA and stenting of the proximal RCA from 99% to 0% with TIMI grade 3 flow (final diameter 3.5 mm) 3. Intermediate stenosis involving the LAD with a proximal to mid segmental 50-70% stenosis. We did not treat this region or perform FFR given the normal anterior wall perfusion on nuclear scintigraphy. 4. Circumflex coronary artery is widely patent 5. Normal left ventricular function   RECOMMENDATION:   1. Aspirin and Brilinta, dual antiplatelet therapy for one year 2. If continued anginal complaints following this procedure, I will contemplate repeating a myocardial perfusion study to see if there is anterior wall ischemia. Perhaps anterior wall ischemia was not noticed on the current scan because he intensity of ischemia in the inferior wall was much more noticeable. Certainly if symptoms continue or anterior ischemia is evident demonstrated, the LAD could be easily stented. 3. Consider discharge in a.m. if no complications

## 2014-05-30 ENCOUNTER — Encounter (HOSPITAL_COMMUNITY): Payer: Medicare HMO

## 2014-05-30 ENCOUNTER — Other Ambulatory Visit (HOSPITAL_COMMUNITY): Payer: Medicare HMO

## 2014-05-30 DIAGNOSIS — E781 Pure hyperglyceridemia: Secondary | ICD-10-CM

## 2014-05-30 DIAGNOSIS — Z8673 Personal history of transient ischemic attack (TIA), and cerebral infarction without residual deficits: Secondary | ICD-10-CM | POA: Diagnosis not present

## 2014-05-30 DIAGNOSIS — E785 Hyperlipidemia, unspecified: Secondary | ICD-10-CM | POA: Diagnosis not present

## 2014-05-30 DIAGNOSIS — I25119 Atherosclerotic heart disease of native coronary artery with unspecified angina pectoris: Secondary | ICD-10-CM | POA: Diagnosis not present

## 2014-05-30 DIAGNOSIS — Z7982 Long term (current) use of aspirin: Secondary | ICD-10-CM | POA: Diagnosis not present

## 2014-05-30 DIAGNOSIS — I10 Essential (primary) hypertension: Secondary | ICD-10-CM

## 2014-05-30 DIAGNOSIS — I209 Angina pectoris, unspecified: Secondary | ICD-10-CM | POA: Diagnosis not present

## 2014-05-30 DIAGNOSIS — I25118 Atherosclerotic heart disease of native coronary artery with other forms of angina pectoris: Secondary | ICD-10-CM | POA: Diagnosis not present

## 2014-05-30 DIAGNOSIS — K219 Gastro-esophageal reflux disease without esophagitis: Secondary | ICD-10-CM | POA: Diagnosis not present

## 2014-05-30 DIAGNOSIS — Z87891 Personal history of nicotine dependence: Secondary | ICD-10-CM | POA: Diagnosis not present

## 2014-05-30 LAB — CBC
HCT: 44.2 % (ref 39.0–52.0)
Hemoglobin: 14.1 g/dL (ref 13.0–17.0)
MCH: 27.1 pg (ref 26.0–34.0)
MCHC: 31.9 g/dL (ref 30.0–36.0)
MCV: 84.8 fL (ref 78.0–100.0)
PLATELETS: 241 10*3/uL (ref 150–400)
RBC: 5.21 MIL/uL (ref 4.22–5.81)
RDW: 13.7 % (ref 11.5–15.5)
WBC: 9.8 10*3/uL (ref 4.0–10.5)

## 2014-05-30 LAB — BASIC METABOLIC PANEL
Anion gap: 6 (ref 5–15)
BUN: 12 mg/dL (ref 6–23)
CHLORIDE: 106 meq/L (ref 96–112)
CO2: 27 mmol/L (ref 19–32)
CREATININE: 1.19 mg/dL (ref 0.50–1.35)
Calcium: 8.6 mg/dL (ref 8.4–10.5)
GFR calc Af Amer: 70 mL/min — ABNORMAL LOW (ref >90)
GFR calc non Af Amer: 60 mL/min — ABNORMAL LOW (ref >90)
Glucose, Bld: 118 mg/dL — ABNORMAL HIGH (ref 70–99)
Potassium: 3.9 mmol/L (ref 3.5–5.1)
SODIUM: 139 mmol/L (ref 135–145)

## 2014-05-30 MED ORDER — NITROGLYCERIN 0.4 MG SL SUBL: 0.4000 mg | SUBLINGUAL_TABLET | SUBLINGUAL | Status: DC | PRN

## 2014-05-30 MED ORDER — ASPIRIN 81 MG PO CHEW: 81.0000 mg | CHEWABLE_TABLET | Freq: Every day | ORAL | Status: DC

## 2014-05-30 MED ORDER — TICAGRELOR 90 MG PO TABS: 90.0000 mg | ORAL_TABLET | Freq: Two times a day (BID) | ORAL | Status: DC

## 2014-05-30 MED ORDER — METOPROLOL TARTRATE 25 MG PO TABS: 25.0000 mg | ORAL_TABLET | Freq: Two times a day (BID) | ORAL | Status: DC

## 2014-05-30 MED FILL — Sodium Chloride IV Soln 0.9%: INTRAVENOUS | Qty: 50 | Status: AC

## 2014-05-30 NOTE — Discharge Summary (Signed)
Physician Discharge Summary      Cardiologist:  Turner Patient ID: FARMER MCCAHILL MRN: [id number] DOB/AGE: 71-Sep-1947 71 y.o.  Admit date: 05/29/2014 Discharge date: 05/30/2014  Admission Diagnoses:   Angina pectoris, Abnormal nuclear stress test  Discharge Diagnoses:  Active Problems:   Hyperlipidemia, group B   Essential hypertension, benign   Angina pectoris   Abnormal nuclear stress test   Discharged Condition: stable  Hospital Course:   KOSISOCHUKWU BURNINGHAM is a 69 y.o. male with a history of GERD, dyslipidemia, TIA and PVD who presents today for followup of chest pain. He says that the CP occurs only when he exerts himself. He has never had any pain at rest. He describes it as a tightness and belt across his chest. He gets SOB with the discomfort and has to stop and rest. He denies any nausea but may get a little diaphoretic. It usually lasts a few seconds and resolves as soon as he rests. He denies any LE edema, palpitations, dizziness or syncope. He underwent nuclear stress test showing a medium sized, moderate defect in the mid and basal inferior and inferolateral walls that is partially fixed with some reversibility consistent with ischemia. There was asmall in size, mild fixed defect in the apex. He now presents today for followup to discuss results of stress test.  The patient was scheduled for left heart cath which revealed subtotally occluded native right coronary with 99% proximal stenosis and TIMI grade 2 flow. The distal right coronary territory is supplied by left to right collaterals from both the LAD and circumflex. He underwent successful PTCA and stenting of the proximal RCA from 99% to 0% with TIMI grade 3 flow (final diameter 3.5 mm). There is intermediate stenosis involving the LAD with a proximal to mid segmental 50-70% stenosis. This was not treated, or FFR performed, given the normal anterior wall perfusion on nuclear scintigraphy. Circumflex coronary artery  is widely patent.  Normal left ventricular function. ASA, brilinta, statin, lopressor 25bid, cozaar 50. BP stable and controlled.  He ambulated well with cardiac rehab although he appeared SOB.  The patient was seen by Dr. Tamala Julian who felt he was stable for DC home.   Consults: Cardiac rehab  Significant Diagnostic Studies:    Left Heart Catheterization with Coronary Angiography and PCI Report  ASBERRY LASCOLA  69 y.o.  male 05-28-1945  Procedure Date: 05/29/2014 Referring Physician: Fransico Him, M.D. Primary Cardiologist: Fransico Him, M.D.  INDICATIONS: New onset angina pectoris, class III with high risk myocardial perfusion study demonstrating inferolateral ischemia  PROCEDURE: 1. Left heart catheterization; 2. Coronary angiography; 3. Left ventriculography; 4. DES RCA subtotal occlusion  CONSENT:  The risks, benefits, and details of the procedure were explained in detail to the patient. Risks including death, stroke, heart attack, kidney injury, allergy, limb ischemia, bleeding and radiation injury were discussed. The patient verbalized understanding and wanted to proceed. Informed written consent was obtained.  PROCEDURE TECHNIQUE: After Xylocaine anesthesia a 5 French Slender sheath was placed in the right radial artery with an angiocath and the modified Seldinger technique. Coronary angiography was done using a 5 F JR 4 and JL 3.5 cm diagnostic catheter, a 6 [place] JR 4 guide catheter, and an XB RCA 6 [place] guide catheter. Left ventriculography was done using theJR 4 diagnostic catheter and hand injection.   After reviewing the images, the culprit for the patient's abnormal nuclear study is 99% proximal RCA stenosis with TIMI grade 2 flow. The distal vessel is supplied  by left-to-right collaterals. There is segmental proximal 50-70% LAD stenosis. Circumflex is widely patent.  After considering the clinical presentation and data he decided to proceed with PCI on the RCA.  Obtaining coaxial guide support was difficult with standard catheters. We ultimately use an XB RCA 6 [place] guide catheter. This obtain reasonable guide shots. Bivalirudin bolus and infusion was started. A CT was documented to be therapeutic, greater than 300. 180 mg of Brilinta was given orally. We used a Pro-water guidewire to advance into the distal right coronary with very little difficulty. We then predilated the proximal stenosis in the RCA a 2.0 x 15 mm long Emerge balloon catheter. 2 balloon inflations were performed. 200 g intracoronary nitroglycerin was then administered and revealed a large/dominant RCA with PDA and left ventricular branches. We then positioned and deployed a 3.0 x 28 mm long Xience Alpine drug-eluting stent to 14 atm. 2 balloon inflations were performed. We then post dilated the proximal two thirds of the stent with a 3.5 x 15 mm [place] Euphora balloon to 13 atm 2. A nice angiographic result was obtained. TIMI grade 3 flow was noted. No evidence of dissection or other complicating issue was identified.  The case was terminated. The sheath was removed and hemostasis achieved with a wrist band at 12 cc of air.  CONTRAST: Total of210 cc.  COMPLICATIONS: None   HEMODYNAMICS: Aortic pressure 130/80 mmHg; LV pressure 132/12 mmHg; LVEDP 21 mmHg  ANGIOGRAPHIC DATA: The left main coronary artery is widely patent/normal.  The left anterior descending artery islarge and wraps around the left ventricular apex. He is large into the proximal first diagonal. There is eccentric segmental 50-70% proximal LAD stenosis. The tightest region of narrowing is near the origin of the first septal perforator. The LAD is otherwise widely patent. Collaterals to the distal right coronary and noted filling from the septal perforators.  The left circumflex artery islarge, gives origin to 4 obtuse marginal branches with the second obtuse marginal being the largest of the 4. The second marginal  bifurcates on the lateral wall. Collaterals to the distal right coronary and noted filling from the circumflex..  The right coronary artery issubtotally occluded proximally with a 99% stenosis and TIMI grade 2 flow.Marland Kitchen   PCI RESULTS: The 99% proximal RCA subtotal occlusion was predilated and stented with a 3.5 mm x 28 mm long Zience Alpine drug-eluting stent with 0% residual stenosis and TIMI grade 3 flow.  LEFT VENTRICULOGRAM: Left ventricular angiogram was done in the 30 RAO projection and rebasal hypokinesis involving the inferior wall. Estimated ejection fraction 55%.   IMPRESSIONS: 1. Subtotally occluded native right coronary with 99% proximal stenosis and TIMI grade 2 flow. The distal right coronary territory is supplied by left to right collaterals from both the LAD and circumflex. 2. Successful PTCA and stenting of the proximal RCA from 99% to 0% with TIMI grade 3 flow (final diameter 3.5 mm) 3. Intermediate stenosis involving the LAD with a proximal to mid segmental 50-70% stenosis. We did not treat this region or perform FFR given the normal anterior wall perfusion on nuclear scintigraphy. 4. Circumflex coronary artery is widely patent 5. Normal left ventricular function   RECOMMENDATION: 1. Aspirin and Brilinta, dual antiplatelet therapy for one year 2. If continued anginal complaints following this procedure, I will contemplate repeating a myocardial perfusion study to see if there is anterior wall ischemia. Perhaps anterior wall ischemia was not noticed on the current scan because he intensity of ischemia in the  inferior wall was much more noticeable. Certainly if symptoms continue or anterior ischemia is evident demonstrated, the LAD could be easily stented. 3. Consider discharge in a.m. if no complications   Echo results from 05/23/14 Study Conclusions  - Left ventricle: The cavity size was normal. Wall thickness was normal. Systolic function was normal. The estimated  ejection fraction was in the range of 60% to 65%. Wall motion was normal; there were no regional wall motion abnormalities. Doppler parameters are consistent with abnormal left ventricular relaxation (grade 1 diastolic dysfunction).  Treatments: See above  Discharge Exam: Blood pressure 152/88, pulse 82, temperature 98.1 F (36.7 C), temperature source Oral, resp. rate 18, height _0  (1.702 m), weight 215 lb 9.8 oz (97.8 kg), SpO2 97 %.   Disposition:       Discharge Instructions    Amb Referral to Cardiac Rehabilitation    Complete by:  As directed      Diet - low sodium heart healthy    Complete by:  As directed      Discharge instructions    Complete by:  As directed   No lifting with right arm for three days.     Increase activity slowly    Complete by:  As directed             Medication List    TAKE these medications        aspirin 81 MG chewable tablet  Chew 1 tablet (81 mg total) by mouth daily.     fexofenadine 180 MG tablet  Commonly known as:  ALLEGRA  Take 180 mg by mouth daily.     losartan-hydrochlorothiazide 50-12.5 MG per tablet  Commonly known as:  HYZAAR  Take 1 tablet by mouth daily.     metoprolol tartrate 25 MG tablet  Commonly known as:  LOPRESSOR  Take 1 tablet (25 mg total) by mouth 2 (two) times daily.     multivitamin with minerals Tabs tablet  Take 1 tablet by mouth daily.     nitroGLYCERIN 0.4 MG SL tablet  Commonly known as:  NITROSTAT  Place 1 tablet (0.4 mg total) under the tongue every 5 (five) minutes as needed for chest pain.     omeprazole 20 MG capsule  Commonly known as:  PRILOSEC  take 1 capsule by mouth twice a day     simvastatin 40 MG tablet  Commonly known as:  ZOCOR  take 1 tablet by mouth at bedtime     ticagrelor 90 MG Tabs tablet  Commonly known as:  BRILINTA  Take 1 tablet (90 mg total) by mouth 2 (two) times daily.     VITAMIN B 12 PO  Take 1 tablet by mouth daily.       Follow-up  Information    Follow up with Richardson Dopp, PA-C On 06/20/2014.   Specialty:  Physician Assistant   Why:  8:30 AM   Contact information:   [address] [place] [address] [phone]      Greater than 30 minutes was spent completing the patient's discharge.    SignedTarri Fuller, [place] 05/30/2014, 11:18 AM

## 2014-05-30 NOTE — Progress Notes (Signed)
CARDIAC REHAB PHASE I   PRE:  Rate/Rhythm: 85 SR    BP: sitting 152/88    SaO2:   MODE:  Ambulation: 500 ft   POST:  Rate/Rhythm: 100 ST    BP: sitting155/86      SaO2:   Pt able to walk without chest tightness. Seemed SOB with donning socks and walking however pt did not notice this, apparently is his norm. Sts he gained 30 lbs when he retired 3 years ago. Ed completed with pt however had to do extensive teach back and reiteration of specifics as it seems pt struggles to comprehend and process information. Pt did not seem surprised by this and unfortunately no family is present. Gave materials. Pt is interested in CRPII and will send referral to [facility]  Darrick Meigs CES, ACSM 05/30/2014 9:15 AM

## 2014-05-30 NOTE — Progress Notes (Signed)
Subjective: No CP or SOB  Objective: Vital signs in last 24 hours: Temp:  [97.3 F (36.3 C)-98.2 F (36.8 C)] 98.1 F (36.7 C) (01/07 0656) Pulse Rate:  [69-93] 79 (01/07 0656) Resp:  [16-18] 18 (01/07 0656) BP: (120-142)/(77-96) 125/79 mmHg (01/07 0656) SpO2:  [93 %-99 %] 97 % (01/07 0656) Weight:  [215 lb (97.523 kg)-215 lb 9.8 oz (97.8 kg)] 215 lb 9.8 oz (97.8 kg) (01/07 0100)    Intake/Output from previous day: 01/06 0701 - 01/07 0700 In: 38 [P.O.:360; I.V.:350] Out: 1050 [Urine:1050] Intake/Output this shift:    Medications Current Facility-Administered Medications  Medication Dose Route Frequency Provider Last Rate Last Dose  . acetaminophen (TYLENOL) tablet 650 mg  650 mg Oral Q4H PRN Belva Crome III, MD      . aspirin chewable tablet 81 mg  81 mg Oral Daily Belva Crome III, MD      . losartan (COZAAR) tablet 50 mg  50 mg Oral Daily Belva Crome III, MD       And  . hydrochlorothiazide (MICROZIDE) capsule 12.5 mg  12.5 mg Oral Daily Belva Crome III, MD      . loratadine (CLARITIN) tablet 10 mg  10 mg Oral Daily Belva Crome III, MD      . metoprolol tartrate (LOPRESSOR) tablet 25 mg  25 mg Oral BID Belva Crome III, MD   25 mg at 05/29/14 2149  . morphine 2 MG/ML injection 2 mg  2 mg Intravenous Q2H PRN Belva Crome III, MD      . multivitamin with minerals tablet 1 tablet  1 tablet Oral Daily Belva Crome III, MD      . nitroGLYCERIN (NITROSTAT) SL tablet 0.4 mg  0.4 mg Sublingual Q5 min PRN Belva Crome III, MD      . ondansetron [facility]) injection 4 mg  4 mg Intravenous Q6H PRN Belva Crome III, MD      . oxyCODONE-acetaminophen (PERCOCET/ROXICET) 5-325 MG per tablet 1-2 tablet  1-2 tablet Oral Q4H PRN Belva Crome III, MD      . pantoprazole (PROTONIX) EC tablet 40 mg  40 mg Oral Daily Belva Crome III, MD      . simvastatin (ZOCOR) tablet 40 mg  40 mg Oral QHS Belva Crome III, MD   40 mg at 05/29/14 2149  . ticagrelor (BRILINTA) tablet 90 mg   90 mg Oral BID Belva Crome III, MD   90 mg at 05/30/14 0522    PE: General appearance: alert, cooperative and no distress Lungs: clear to auscultation bilaterally Heart: regular rate and rhythm, S1, S2 normal, no murmur, click, rub or gallop Extremities: No LEE Pulses: 2+ and symmetric Skin: Warm and dry Neurologic: Grossly normal  Lab Results:   Recent Labs  05/28/14 1440 05/30/14 0521  WBC 11.1* 9.8  HGB 15.1 14.1  HCT 47.2 44.2  PLT 268.0 241   BMET  Recent Labs  05/28/14 1440 05/30/14 0521  NA 137 139  K 3.6 3.9  CL 102 106  CO2 28 27  GLUCOSE 121* 118*  BUN 12 12  CREATININE 1.1 1.19  CALCIUM 9.4 8.6   PT/INR  Recent Labs  05/28/14 1440  LABPROT 11.5  INR 1.0   Assessment/Plan   Active Problems:   Hyperlipidemia, group B   Essential hypertension, benign   Angina pectoris   Abnormal nuclear stress test  Plan:   SP LHC  revealing subtotally occluded native right coronary with 99% proximal stenosis and TIMI grade 2 flow. The distal right coronary territory is supplied by left to right collaterals from both the LAD and circumflex.  He underwent successful PTCA and stenting of the proximal RCA from 99% to 0% with TIMI grade 3 flow (final diameter 3.5 mm).  There is intermediate stenosis involving the LAD with a proximal to mid segmental 50-70% stenosis.  This was not treated or FFR performed given the normal anterior wall perfusion on nuclear scintigraphy.  Circumflex coronary artery is widely patent. Normal left ventricular function.   ASA, brilinta, statin, lopressor 25bid, cozaar 50.  BP stable and controlled.  DC today.     LOS: 1 day    [name] PA-C 05/30/2014 8:14 AM

## 2014-06-20 ENCOUNTER — Encounter: Payer: Self-pay | Admitting: Physician Assistant

## 2014-06-20 ENCOUNTER — Ambulatory Visit (INDEPENDENT_AMBULATORY_CARE_PROVIDER_SITE_OTHER): Payer: Commercial Managed Care - HMO | Admitting: Physician Assistant

## 2014-06-20 VITALS — BP 122/82 | HR 63 | Ht 67.0 in | Wt 209.0 lb

## 2014-06-20 DIAGNOSIS — E781 Pure hyperglyceridemia: Secondary | ICD-10-CM

## 2014-06-20 DIAGNOSIS — I10 Essential (primary) hypertension: Secondary | ICD-10-CM

## 2014-06-20 DIAGNOSIS — I2510 Atherosclerotic heart disease of native coronary artery without angina pectoris: Secondary | ICD-10-CM | POA: Diagnosis not present

## 2014-06-20 NOTE — Progress Notes (Signed)
Cardiology Office Note   Date:  06/20/2014   ID:  Carl Harrell, DOB 06-19-1945, MRN [id number]  PCP:  Joycelyn Man, MD  Cardiologist:  Dr. Fransico Him    Chief Complaint  Patient presents with  . Coronary Artery Disease    s/p PCI of the RCA with a DES     History of Present Illness: Carl Harrell is a 69 y.o. male who presents for FU of the above.    He has a history of prior TIA, HL, GERD, PAD. He recently saw Dr. Radford Pax for exertional chest pain. Nuclear stress test was abnormal and read out as high risk. He was set up for cardiac catheterization.  [place] 05/29/14 demonstrated a subtotally occluded native RCA with 99% proximal stenosis which was treated with a DES. There was moderate stenosis noted in the LAD at 50-70%. There was normal anterior perfusion on nuclear stress test.  Dr. Tamala Julian performed this procedure and noted that if he had continued anginal symptoms, consideration should be given towards repeating nuclear study to assess for anterior ischemia with an eye towards PCI of the LAD.    He is doing well since his PCI.  He has an episode of belching and gas on one occasion.  Otherwise, he denies anginal symptoms.  He denies significant dyspnea.  He is NYHA 2.  He denies orthopnea, PND, edema.  Denies syncope.    Studies/Reports Reviewed Today:  - LHC (05/29/14):  pLAD 50-70, pRCA 99 (L>R collats), EF 55% >> PCI:  3.5 x 28 mm Xience Alpine DES to pRCA  - Echocardiogram (05/23/14):  EF 60-65%, no RWMA, Gr 1 DD   - Nuclear Stress Test (05/23/14):  Inferior and inferolateral defect that is partially fixed with some reversibility consistent with ischemia, fixed defect in the apex, EF 62%; High Risk   Past Medical History  Diagnosis Date  . ALLERGIC RHINITIS 01/30/2007  . GERD 09/26/2007  . HYPERLIPIDEMIA 09/26/2007  . PLANTAR FASCIITIS, BILATERAL 07/08/2009  . Unspecified hearing loss 06/16/2009    wears aides both ears  . ED (erectile dysfunction)   . Coronary artery  disease   . Hypertension   . Arthritis     "hands" (05/29/2014)    Past Surgical History  Procedure Laterality Date  . Knee arthroscopy Right ~ 2005  . Coronary angioplasty with stent placement  05/29/2014    "1"  . Left heart catheterization with coronary angiogram N/A 05/29/2014    Procedure: LEFT HEART CATHETERIZATION WITH CORONARY ANGIOGRAM;  Surgeon: Sinclair Grooms, MD;  Location: [facility] CATH LAB;  Service: Cardiovascular;  Laterality: N/A;  . Cardiac catheterization  05/29/2014    Procedure: CORONARY STENT INTERVENTION;  Surgeon: Sinclair Grooms, MD;  Location: [facility] CATH LAB;  Service: Cardiovascular;;  Prox RCA     Current Outpatient Prescriptions  Medication Sig Dispense Refill  . aspirin 81 MG chewable tablet Chew 1 tablet (81 mg total) by mouth daily.    . Cyanocobalamin (VITAMIN B 12 PO) Take 1 tablet by mouth daily.     . fexofenadine (ALLEGRA) 180 MG tablet Take 180 mg by mouth daily.    Marland Kitchen losartan-hydrochlorothiazide (HYZAAR) 50-12.5 MG per tablet Take 1 tablet by mouth daily. 90 tablet 3  . metoprolol tartrate (LOPRESSOR) 25 MG tablet Take 1 tablet (25 mg total) by mouth 2 (two) times daily. 60 tablet 5  . Multiple Vitamin (MULTIVITAMIN WITH MINERALS) TABS tablet Take 1 tablet by mouth daily.    Marland Kitchen  nitroGLYCERIN (NITROSTAT) 0.4 MG SL tablet Place 1 tablet (0.4 mg total) under the tongue every 5 (five) minutes as needed for chest pain. 25 tablet 12  . omeprazole (PRILOSEC) 20 MG capsule take 1 capsule by mouth twice a day (Patient taking differently: Take 20 mg by mouth 2 (two) times daily before a meal. ) 100 capsule 3  . simvastatin (ZOCOR) 40 MG tablet take 1 tablet by mouth at bedtime (Patient taking differently: Take 40 mg by mouth at bedtime. ) 90 tablet 3  . ticagrelor (BRILINTA) 90 MG TABS tablet Take 1 tablet (90 mg total) by mouth 2 (two) times daily. 60 tablet 10   No current facility-administered medications for this visit.    Allergies:   Review of patient's  allergies indicates no known allergies.    Social History:  The patient  reports that he has quit smoking. His smoking use included Cigarettes. He has never used smokeless tobacco. He reports that he drinks about 1.2 oz of alcohol per week. He reports that he does not use illicit drugs.   Family History:  The patient's family history includes Heart attack in his father and mother; Heart disease in his father and mother. There is no history of COPD or Hyperlipidemia.    ROS:  Please see the history of present illness.   Otherwise, review of systems are positive for none.   All other systems are reviewed and negative.    PHYSICAL EXAM: VS:  BP 122/82 mmHg  Pulse 63  Ht 5\' 7"  (1.702 m)  Wt 209 lb (94.802 kg)  BMI 32.73 kg/m2    Wt Readings from Last 3 Encounters:  06/20/14 209 lb (94.802 kg)  05/30/14 215 lb 9.8 oz (97.8 kg)  05/28/14 217 lb (98.431 kg)     GEN: Well nourished, well developed, in no acute distress HEENT: normal Neck: no JVD, no carotid bruits, no masses Cardiac:  Normal S1/S2, RRR; no murmur, no rubs or gallops, no edema, right wrist without hematoma or mass  Respiratory:  clear to auscultation bilaterally, no wheezing, rhonchi or rales. GI: soft, nontender, nondistended, + BS MS: no deformity or atrophy Skin: warm and dry  Neuro:  CNs II-XII intact, Strength and sensation are intact Psych: Normal affect   EKG:  EKG is ordered today.  It demonstrates:   NSR, HR 63, inferior Q waves, no significant change when compared to prior tracings   Recent Labs: 03/14/2014: ALT 44; TSH 1.53 05/30/2014: BUN 12; Creatinine 1.19; Hemoglobin 14.1; Platelets 241; Potassium 3.9; Sodium 139    Lipid Panel    Component Value Date/Time   CHOL 159 03/14/2014 0802   TRIG 183.0* 03/14/2014 0802   HDL 34.80* 03/14/2014 0802   CHOLHDL 5 03/14/2014 0802   VLDL 36.6 03/14/2014 0802   LDLCALC 88 03/14/2014 0802      ASSESSMENT AND PLAN:  1.  Coronary Artery Disease:  He is  doing well after recent PCI to the RCA with a DES. He does have residual moderate disease in the LAD. However, he denies any further anginal symptoms. We discussed slowly increasing his activity. We also discussed the importance of earlier follow-up should he have recurrent symptoms that remind him of his previous angina. He is tolerating his current medications. He is not interested in formal cardiac rehabilitation. He will continue to increase his activity at his local [employer].    -  We discussed the importance of dual antiplatelet therapy.     -  Continue ASA,  Brilinta, statin, beta blocker.  2.  Hypertension:  Controlled.  He is tolerating Metoprolol Tartrate, Hyzaar. 3.  Hyperlipidemia:  Managed by PCP.  Continue moderate intensity statin.   4.  Prior TIA:  He notes increased strength on his right and improved balance since his PCI.    Current medicines are reviewed at length with the patient today.  The patient does not have concerns regarding medicines.  The following changes have been made:  no change  Labs/ tests ordered today include:  No orders of the defined types were placed in this encounter.     Disposition:   FU with Dr. Fransico Him  in 6 weeks.   Signed, Versie Starks, MHS 06/20/2014 8:41 AM    [facility] [place], [place], [place]  [address] Phone: [phone]; Fax: [phone]

## 2014-06-20 NOTE — Patient Instructions (Signed)
It was good to meet you today.   Slowly increase your activity.  Call if you start having chest discomfort again.  Continue your current medications.  Do not stop the Aspirin or Brilinta.  These medications are very important.  Schedule follow up with Dr. Fransico Him in 4-6 weeks.

## 2014-06-26 ENCOUNTER — Telehealth: Payer: Self-pay

## 2014-06-26 DIAGNOSIS — I10 Essential (primary) hypertension: Secondary | ICD-10-CM

## 2014-06-26 DIAGNOSIS — R05 Cough: Secondary | ICD-10-CM

## 2014-06-26 DIAGNOSIS — T464X5A Adverse effect of angiotensin-converting-enzyme inhibitors, initial encounter: Principal | ICD-10-CM

## 2014-06-26 MED ORDER — SIMVASTATIN 40 MG PO TABS: ORAL_TABLET | ORAL | Status: DC

## 2014-06-26 MED ORDER — OMEPRAZOLE 20 MG PO CPDR: DELAYED_RELEASE_CAPSULE | ORAL | Status: DC

## 2014-06-26 MED ORDER — METOPROLOL TARTRATE 25 MG PO TABS: 25.0000 mg | ORAL_TABLET | Freq: Two times a day (BID) | ORAL | Status: DC

## 2014-06-26 MED ORDER — LOSARTAN POTASSIUM-HCTZ 50-12.5 MG PO TABS
1.0000 | ORAL_TABLET | Freq: Every day | ORAL | Status: DC
Start: 2014-06-26 — End: 2014-09-05

## 2014-06-26 MED ORDER — LOSARTAN POTASSIUM-HCTZ 50-12.5 MG PO TABS
1.0000 | ORAL_TABLET | Freq: Every day | ORAL | Status: DC
Start: 2014-06-26 — End: 2014-06-26

## 2014-06-26 NOTE — Addendum Note (Signed)
Addended by: Colleen Can on: 06/26/2014 01:28 PM   Modules accepted: Orders

## 2014-06-26 NOTE — Telephone Encounter (Signed)
Rx request for:  Losartan-HCTZ 50-12.5 tablet Metoprolol tartrate 25 mg tablet Omeprazole DR 20 mg capsule  Pharm:  [facility]

## 2014-06-26 NOTE — Telephone Encounter (Signed)
Rx request for simvastatin 40 mg tablet-Take 1 tablet by mouth at bedtime. #90

## 2014-06-27 ENCOUNTER — Other Ambulatory Visit: Payer: Self-pay | Admitting: Family Medicine

## 2014-06-28 ENCOUNTER — Encounter: Payer: Self-pay | Admitting: Physician Assistant

## 2014-06-28 NOTE — Telephone Encounter (Signed)
This encounter was created in error - please disregard.

## 2014-07-04 ENCOUNTER — Encounter (HOSPITAL_COMMUNITY)
Admission: RE | Admit: 2014-07-04 | Discharge: 2014-07-04 | Disposition: A | Discharge status: Home / Self Care | Payer: Commercial Managed Care - HMO | Source: Ambulatory Visit | Attending: Cardiology | Admitting: Cardiology

## 2014-07-04 DIAGNOSIS — I10 Essential (primary) hypertension: Secondary | ICD-10-CM | POA: Insufficient documentation

## 2014-07-04 DIAGNOSIS — Z5189 Encounter for other specified aftercare: Secondary | ICD-10-CM | POA: Insufficient documentation

## 2014-07-04 DIAGNOSIS — I25119 Atherosclerotic heart disease of native coronary artery with unspecified angina pectoris: Secondary | ICD-10-CM | POA: Insufficient documentation

## 2014-07-04 DIAGNOSIS — Z8673 Personal history of transient ischemic attack (TIA), and cerebral infarction without residual deficits: Secondary | ICD-10-CM | POA: Insufficient documentation

## 2014-07-04 DIAGNOSIS — E785 Hyperlipidemia, unspecified: Secondary | ICD-10-CM | POA: Insufficient documentation

## 2014-07-04 DIAGNOSIS — K219 Gastro-esophageal reflux disease without esophagitis: Secondary | ICD-10-CM | POA: Insufficient documentation

## 2014-07-04 DIAGNOSIS — Z7982 Long term (current) use of aspirin: Secondary | ICD-10-CM | POA: Insufficient documentation

## 2014-07-04 DIAGNOSIS — Z955 Presence of coronary angioplasty implant and graft: Secondary | ICD-10-CM | POA: Insufficient documentation

## 2014-07-04 DIAGNOSIS — Z87891 Personal history of nicotine dependence: Secondary | ICD-10-CM | POA: Insufficient documentation

## 2014-07-04 NOTE — Progress Notes (Signed)
Cardiac Rehab Medication Review by a Pharmacist  Does the patient  feel that his/her medications are working for him/her?  yes  Has the patient been experiencing any side effects to the medications prescribed?  yes  Does the patient measure his/her own blood pressure or blood glucose at home?  yes   Does the patient have any problems obtaining medications due to transportation or finances?   no  Understanding of regimen: fair Understanding of indications: poor Potential of compliance: good    Pharmacist comments: Pt reports side effect of small stream when urinating.  He states that it doesn't occur if he skips his meds.  Unsure of which med might be causing that side effect.  For some reason it took him about a week to get his first fill of Brilinta - sounds like the Rx possibly didn't get sent to the pharmacy upon discharge from hospital..?  Reports not missing any doses of meds since D/C from hospital, uses a pill box.  Counseling and education provided regarding use of SL NTG, other questions answered.  Drucie Opitz, PharmD Clinical Pharmacy Resident Pager: [phone] 07/04/2014 8:32 AM

## 2014-07-08 ENCOUNTER — Encounter (HOSPITAL_COMMUNITY): Payer: Commercial Managed Care - HMO

## 2014-07-10 ENCOUNTER — Encounter (HOSPITAL_COMMUNITY)
Admission: RE | Admit: 2014-07-10 | Discharge: 2014-07-10 | Disposition: A | Discharge status: Home / Self Care | Payer: Commercial Managed Care - HMO | Source: Ambulatory Visit | Attending: Cardiology | Admitting: Cardiology

## 2014-07-10 ENCOUNTER — Encounter (HOSPITAL_COMMUNITY): Payer: Commercial Managed Care - HMO

## 2014-07-10 DIAGNOSIS — I10 Essential (primary) hypertension: Secondary | ICD-10-CM | POA: Diagnosis not present

## 2014-07-10 DIAGNOSIS — Z5189 Encounter for other specified aftercare: Secondary | ICD-10-CM | POA: Diagnosis not present

## 2014-07-10 DIAGNOSIS — Z7982 Long term (current) use of aspirin: Secondary | ICD-10-CM | POA: Diagnosis not present

## 2014-07-10 DIAGNOSIS — Z87891 Personal history of nicotine dependence: Secondary | ICD-10-CM | POA: Diagnosis not present

## 2014-07-10 DIAGNOSIS — I25119 Atherosclerotic heart disease of native coronary artery with unspecified angina pectoris: Secondary | ICD-10-CM | POA: Diagnosis not present

## 2014-07-10 DIAGNOSIS — Z955 Presence of coronary angioplasty implant and graft: Secondary | ICD-10-CM | POA: Diagnosis not present

## 2014-07-10 DIAGNOSIS — K219 Gastro-esophageal reflux disease without esophagitis: Secondary | ICD-10-CM | POA: Diagnosis not present

## 2014-07-10 DIAGNOSIS — E785 Hyperlipidemia, unspecified: Secondary | ICD-10-CM | POA: Diagnosis not present

## 2014-07-10 DIAGNOSIS — Z8673 Personal history of transient ischemic attack (TIA), and cerebral infarction without residual deficits: Secondary | ICD-10-CM | POA: Diagnosis not present

## 2014-07-10 NOTE — Progress Notes (Signed)
Pt in today for his first day of exercise (6:45) at the cardiac rehab phase II program.  Pt tolerated exercise with no complaints. Monitor showed sr with no noted ectopy. Medication list reconciled.  Pt denies any barriers associated with his medication.  Pt verbalizes compliance with his medication. Psychological Assessment PHQ2 score 0.  Pt denies any depression and has support of his wife.  Pt take things in stride and demonstrates positive coping skills.  Pt short term goal is to find out what he can do.  The EP will discuss with the patient his home exercise plan so he understands the progression with exercise.  Pt long term goal is to learn his limits and to lose weight about 70 pounds.  Advised pt that healthy weight loss is 1-2 pounds a week.  Instructed on the nutritional classes offered on Tuesdays he can attend.  Pt plans to consult with the dietician in the program about portions and food choices to help him achieve this goal.  Will continue to monitor his progress toward meeting this goal. Maurice Small RN, BSN

## 2014-07-12 ENCOUNTER — Encounter (HOSPITAL_COMMUNITY)
Admission: RE | Admit: 2014-07-12 | Discharge: 2014-07-12 | Disposition: A | Discharge status: Home / Self Care | Payer: Commercial Managed Care - HMO | Source: Ambulatory Visit | Attending: Cardiology | Admitting: Cardiology

## 2014-07-12 ENCOUNTER — Encounter (HOSPITAL_COMMUNITY): Payer: Commercial Managed Care - HMO

## 2014-07-12 DIAGNOSIS — Z7982 Long term (current) use of aspirin: Secondary | ICD-10-CM | POA: Diagnosis not present

## 2014-07-12 DIAGNOSIS — K219 Gastro-esophageal reflux disease without esophagitis: Secondary | ICD-10-CM | POA: Diagnosis not present

## 2014-07-12 DIAGNOSIS — I25119 Atherosclerotic heart disease of native coronary artery with unspecified angina pectoris: Secondary | ICD-10-CM | POA: Diagnosis not present

## 2014-07-12 DIAGNOSIS — Z955 Presence of coronary angioplasty implant and graft: Secondary | ICD-10-CM | POA: Diagnosis not present

## 2014-07-12 DIAGNOSIS — E785 Hyperlipidemia, unspecified: Secondary | ICD-10-CM | POA: Diagnosis not present

## 2014-07-12 DIAGNOSIS — Z87891 Personal history of nicotine dependence: Secondary | ICD-10-CM | POA: Diagnosis not present

## 2014-07-12 DIAGNOSIS — Z8673 Personal history of transient ischemic attack (TIA), and cerebral infarction without residual deficits: Secondary | ICD-10-CM | POA: Diagnosis not present

## 2014-07-12 DIAGNOSIS — I10 Essential (primary) hypertension: Secondary | ICD-10-CM | POA: Diagnosis not present

## 2014-07-12 DIAGNOSIS — Z5189 Encounter for other specified aftercare: Secondary | ICD-10-CM | POA: Diagnosis not present

## 2014-07-15 ENCOUNTER — Encounter (HOSPITAL_COMMUNITY): Payer: Commercial Managed Care - HMO

## 2014-07-15 ENCOUNTER — Encounter (HOSPITAL_COMMUNITY)
Admission: RE | Admit: 2014-07-15 | Discharge: 2014-07-15 | Disposition: A | Discharge status: Home / Self Care | Payer: Commercial Managed Care - HMO | Source: Ambulatory Visit | Attending: Cardiology | Admitting: Cardiology

## 2014-07-15 DIAGNOSIS — I25119 Atherosclerotic heart disease of native coronary artery with unspecified angina pectoris: Secondary | ICD-10-CM | POA: Diagnosis not present

## 2014-07-15 DIAGNOSIS — Z87891 Personal history of nicotine dependence: Secondary | ICD-10-CM | POA: Diagnosis not present

## 2014-07-15 DIAGNOSIS — Z5189 Encounter for other specified aftercare: Secondary | ICD-10-CM | POA: Diagnosis not present

## 2014-07-15 DIAGNOSIS — Z8673 Personal history of transient ischemic attack (TIA), and cerebral infarction without residual deficits: Secondary | ICD-10-CM | POA: Diagnosis not present

## 2014-07-15 DIAGNOSIS — Z7982 Long term (current) use of aspirin: Secondary | ICD-10-CM | POA: Diagnosis not present

## 2014-07-15 DIAGNOSIS — K219 Gastro-esophageal reflux disease without esophagitis: Secondary | ICD-10-CM | POA: Diagnosis not present

## 2014-07-15 DIAGNOSIS — E785 Hyperlipidemia, unspecified: Secondary | ICD-10-CM | POA: Diagnosis not present

## 2014-07-15 DIAGNOSIS — Z955 Presence of coronary angioplasty implant and graft: Secondary | ICD-10-CM | POA: Diagnosis not present

## 2014-07-15 DIAGNOSIS — I10 Essential (primary) hypertension: Secondary | ICD-10-CM | POA: Diagnosis not present

## 2014-07-17 ENCOUNTER — Encounter (HOSPITAL_COMMUNITY)
Admission: RE | Admit: 2014-07-17 | Discharge: 2014-07-17 | Disposition: A | Discharge status: Home / Self Care | Payer: Commercial Managed Care - HMO | Source: Ambulatory Visit | Attending: Cardiology | Admitting: Cardiology

## 2014-07-17 ENCOUNTER — Encounter (HOSPITAL_COMMUNITY): Payer: Commercial Managed Care - HMO

## 2014-07-17 DIAGNOSIS — Z87891 Personal history of nicotine dependence: Secondary | ICD-10-CM | POA: Diagnosis not present

## 2014-07-17 DIAGNOSIS — Z5189 Encounter for other specified aftercare: Secondary | ICD-10-CM | POA: Diagnosis not present

## 2014-07-17 DIAGNOSIS — E785 Hyperlipidemia, unspecified: Secondary | ICD-10-CM | POA: Diagnosis not present

## 2014-07-17 DIAGNOSIS — Z7982 Long term (current) use of aspirin: Secondary | ICD-10-CM | POA: Diagnosis not present

## 2014-07-17 DIAGNOSIS — I25119 Atherosclerotic heart disease of native coronary artery with unspecified angina pectoris: Secondary | ICD-10-CM | POA: Diagnosis not present

## 2014-07-17 DIAGNOSIS — K219 Gastro-esophageal reflux disease without esophagitis: Secondary | ICD-10-CM | POA: Diagnosis not present

## 2014-07-17 DIAGNOSIS — I10 Essential (primary) hypertension: Secondary | ICD-10-CM | POA: Diagnosis not present

## 2014-07-17 DIAGNOSIS — Z8673 Personal history of transient ischemic attack (TIA), and cerebral infarction without residual deficits: Secondary | ICD-10-CM | POA: Diagnosis not present

## 2014-07-17 DIAGNOSIS — Z955 Presence of coronary angioplasty implant and graft: Secondary | ICD-10-CM | POA: Diagnosis not present

## 2014-07-19 ENCOUNTER — Encounter (HOSPITAL_COMMUNITY): Payer: Commercial Managed Care - HMO

## 2014-07-19 ENCOUNTER — Encounter (HOSPITAL_COMMUNITY)
Admission: RE | Admit: 2014-07-19 | Discharge: 2014-07-19 | Disposition: A | Discharge status: Home / Self Care | Payer: Commercial Managed Care - HMO | Source: Ambulatory Visit | Attending: Cardiology | Admitting: Cardiology

## 2014-07-19 DIAGNOSIS — K219 Gastro-esophageal reflux disease without esophagitis: Secondary | ICD-10-CM | POA: Diagnosis not present

## 2014-07-19 DIAGNOSIS — I10 Essential (primary) hypertension: Secondary | ICD-10-CM | POA: Diagnosis not present

## 2014-07-19 DIAGNOSIS — I25119 Atherosclerotic heart disease of native coronary artery with unspecified angina pectoris: Secondary | ICD-10-CM | POA: Diagnosis not present

## 2014-07-19 DIAGNOSIS — Z87891 Personal history of nicotine dependence: Secondary | ICD-10-CM | POA: Diagnosis not present

## 2014-07-19 DIAGNOSIS — Z8673 Personal history of transient ischemic attack (TIA), and cerebral infarction without residual deficits: Secondary | ICD-10-CM | POA: Diagnosis not present

## 2014-07-19 DIAGNOSIS — Z5189 Encounter for other specified aftercare: Secondary | ICD-10-CM | POA: Diagnosis not present

## 2014-07-19 DIAGNOSIS — Z955 Presence of coronary angioplasty implant and graft: Secondary | ICD-10-CM | POA: Diagnosis not present

## 2014-07-19 DIAGNOSIS — Z7982 Long term (current) use of aspirin: Secondary | ICD-10-CM | POA: Diagnosis not present

## 2014-07-19 DIAGNOSIS — E785 Hyperlipidemia, unspecified: Secondary | ICD-10-CM | POA: Diagnosis not present

## 2014-07-22 ENCOUNTER — Encounter (HOSPITAL_COMMUNITY)
Admission: RE | Admit: 2014-07-22 | Discharge: 2014-07-22 | Disposition: A | Discharge status: Home / Self Care | Payer: Commercial Managed Care - HMO | Source: Ambulatory Visit | Attending: Cardiology | Admitting: Cardiology

## 2014-07-22 ENCOUNTER — Encounter (HOSPITAL_COMMUNITY): Payer: Commercial Managed Care - HMO

## 2014-07-22 DIAGNOSIS — I10 Essential (primary) hypertension: Secondary | ICD-10-CM | POA: Diagnosis not present

## 2014-07-22 DIAGNOSIS — Z7982 Long term (current) use of aspirin: Secondary | ICD-10-CM | POA: Diagnosis not present

## 2014-07-22 DIAGNOSIS — Z5189 Encounter for other specified aftercare: Secondary | ICD-10-CM | POA: Diagnosis not present

## 2014-07-22 DIAGNOSIS — Z955 Presence of coronary angioplasty implant and graft: Secondary | ICD-10-CM | POA: Diagnosis not present

## 2014-07-22 DIAGNOSIS — E785 Hyperlipidemia, unspecified: Secondary | ICD-10-CM | POA: Diagnosis not present

## 2014-07-22 DIAGNOSIS — K219 Gastro-esophageal reflux disease without esophagitis: Secondary | ICD-10-CM | POA: Diagnosis not present

## 2014-07-22 DIAGNOSIS — Z87891 Personal history of nicotine dependence: Secondary | ICD-10-CM | POA: Diagnosis not present

## 2014-07-22 DIAGNOSIS — I25119 Atherosclerotic heart disease of native coronary artery with unspecified angina pectoris: Secondary | ICD-10-CM | POA: Diagnosis not present

## 2014-07-22 DIAGNOSIS — Z8673 Personal history of transient ischemic attack (TIA), and cerebral infarction without residual deficits: Secondary | ICD-10-CM | POA: Diagnosis not present

## 2014-07-22 NOTE — Progress Notes (Signed)
Reviewed home exercise guidelines with patient including endpoints, temperature precautions, target heart rate and rate of perceived exertion. [facility] will send request to cardiologist for clearance to do weights. Pt voices understanding of instructions given.

## 2014-07-24 ENCOUNTER — Encounter (HOSPITAL_COMMUNITY)
Admission: RE | Admit: 2014-07-24 | Discharge: 2014-07-24 | Disposition: A | Discharge status: Home / Self Care | Payer: Commercial Managed Care - HMO | Source: Ambulatory Visit | Attending: Cardiology | Admitting: Cardiology

## 2014-07-24 ENCOUNTER — Encounter (HOSPITAL_COMMUNITY): Payer: Commercial Managed Care - HMO

## 2014-07-24 DIAGNOSIS — I10 Essential (primary) hypertension: Secondary | ICD-10-CM | POA: Diagnosis not present

## 2014-07-24 DIAGNOSIS — I25119 Atherosclerotic heart disease of native coronary artery with unspecified angina pectoris: Secondary | ICD-10-CM | POA: Insufficient documentation

## 2014-07-24 DIAGNOSIS — Z5189 Encounter for other specified aftercare: Secondary | ICD-10-CM | POA: Insufficient documentation

## 2014-07-24 DIAGNOSIS — Z8673 Personal history of transient ischemic attack (TIA), and cerebral infarction without residual deficits: Secondary | ICD-10-CM | POA: Insufficient documentation

## 2014-07-24 DIAGNOSIS — Z7982 Long term (current) use of aspirin: Secondary | ICD-10-CM | POA: Insufficient documentation

## 2014-07-24 DIAGNOSIS — Z87891 Personal history of nicotine dependence: Secondary | ICD-10-CM | POA: Diagnosis not present

## 2014-07-24 DIAGNOSIS — Z955 Presence of coronary angioplasty implant and graft: Secondary | ICD-10-CM | POA: Insufficient documentation

## 2014-07-24 DIAGNOSIS — K219 Gastro-esophageal reflux disease without esophagitis: Secondary | ICD-10-CM | POA: Insufficient documentation

## 2014-07-24 DIAGNOSIS — E785 Hyperlipidemia, unspecified: Secondary | ICD-10-CM | POA: Insufficient documentation

## 2014-07-26 ENCOUNTER — Encounter (HOSPITAL_COMMUNITY): Payer: Commercial Managed Care - HMO

## 2014-07-26 ENCOUNTER — Encounter (HOSPITAL_COMMUNITY)
Admission: RE | Admit: 2014-07-26 | Discharge: 2014-07-26 | Disposition: A | Discharge status: Home / Self Care | Payer: Commercial Managed Care - HMO | Source: Ambulatory Visit | Attending: Cardiology | Admitting: Cardiology

## 2014-07-26 DIAGNOSIS — I10 Essential (primary) hypertension: Secondary | ICD-10-CM | POA: Diagnosis not present

## 2014-07-26 DIAGNOSIS — Z955 Presence of coronary angioplasty implant and graft: Secondary | ICD-10-CM | POA: Diagnosis not present

## 2014-07-26 DIAGNOSIS — K219 Gastro-esophageal reflux disease without esophagitis: Secondary | ICD-10-CM | POA: Diagnosis not present

## 2014-07-26 DIAGNOSIS — I25119 Atherosclerotic heart disease of native coronary artery with unspecified angina pectoris: Secondary | ICD-10-CM | POA: Diagnosis not present

## 2014-07-26 DIAGNOSIS — Z8673 Personal history of transient ischemic attack (TIA), and cerebral infarction without residual deficits: Secondary | ICD-10-CM | POA: Diagnosis not present

## 2014-07-26 DIAGNOSIS — E785 Hyperlipidemia, unspecified: Secondary | ICD-10-CM | POA: Diagnosis not present

## 2014-07-26 DIAGNOSIS — Z7982 Long term (current) use of aspirin: Secondary | ICD-10-CM | POA: Diagnosis not present

## 2014-07-26 DIAGNOSIS — Z5189 Encounter for other specified aftercare: Secondary | ICD-10-CM | POA: Diagnosis not present

## 2014-07-26 DIAGNOSIS — Z87891 Personal history of nicotine dependence: Secondary | ICD-10-CM | POA: Diagnosis not present

## 2014-07-26 NOTE — Progress Notes (Signed)
Carl Harrell 69 y.o. male Nutrition Note Spoke with pt. Pt reports he has made "a lot of dietary changes." Pt is currently following Step 1 of the Therapeutic Lifestyle Changes diet. Nutrition Survey and ways to improve dietary choices discussed. Pt expressed understanding of the information reviewed. Pt aware of nutrition education classes offered and plans on attending nutrition classes.  Nutrition Diagnosis ? Food-and nutrition-related knowledge deficit related to lack of exposure to information as related to diagnosis of: ? CVD   Nutrition Intervention ? Benefits of adopting Therapeutic Lifestyle Changes discussed when Medficts reviewed. ? Pt to attend the Portion Distortion class ? Pt to attend the  ? Nutrition I class                     ? Nutrition II class ? Continue client-centered nutrition education by RD, as part of interdisciplinary care.  Goal(s) ? Pt to identify and limit food sources of saturated fat, trans fat, and cholesterol ? Pt to identify food quantities necessary to achieve: ? wt loss to a goal wt of 184-202 lb (84-92.2 kg) at graduation from cardiac rehab.   Monitor and Evaluate progress toward nutrition goal with team.  Derek Mound, M.Ed, RD, LDN, CDE 07/26/2014 7:48 AM

## 2014-07-29 ENCOUNTER — Encounter (HOSPITAL_COMMUNITY)
Admission: RE | Admit: 2014-07-29 | Discharge: 2014-07-29 | Disposition: A | Discharge status: Home / Self Care | Payer: Commercial Managed Care - HMO | Source: Ambulatory Visit | Attending: Cardiology | Admitting: Cardiology

## 2014-07-29 ENCOUNTER — Encounter (HOSPITAL_COMMUNITY): Payer: Commercial Managed Care - HMO

## 2014-07-29 DIAGNOSIS — Z7982 Long term (current) use of aspirin: Secondary | ICD-10-CM | POA: Diagnosis not present

## 2014-07-29 DIAGNOSIS — I10 Essential (primary) hypertension: Secondary | ICD-10-CM | POA: Diagnosis not present

## 2014-07-29 DIAGNOSIS — Z87891 Personal history of nicotine dependence: Secondary | ICD-10-CM | POA: Diagnosis not present

## 2014-07-29 DIAGNOSIS — K219 Gastro-esophageal reflux disease without esophagitis: Secondary | ICD-10-CM | POA: Diagnosis not present

## 2014-07-29 DIAGNOSIS — I25119 Atherosclerotic heart disease of native coronary artery with unspecified angina pectoris: Secondary | ICD-10-CM | POA: Diagnosis not present

## 2014-07-29 DIAGNOSIS — Z5189 Encounter for other specified aftercare: Secondary | ICD-10-CM | POA: Diagnosis not present

## 2014-07-29 DIAGNOSIS — Z955 Presence of coronary angioplasty implant and graft: Secondary | ICD-10-CM | POA: Diagnosis not present

## 2014-07-29 DIAGNOSIS — Z8673 Personal history of transient ischemic attack (TIA), and cerebral infarction without residual deficits: Secondary | ICD-10-CM | POA: Diagnosis not present

## 2014-07-29 DIAGNOSIS — E785 Hyperlipidemia, unspecified: Secondary | ICD-10-CM | POA: Diagnosis not present

## 2014-07-31 ENCOUNTER — Encounter (HOSPITAL_COMMUNITY): Payer: Commercial Managed Care - HMO

## 2014-07-31 ENCOUNTER — Encounter (HOSPITAL_COMMUNITY)
Admission: RE | Admit: 2014-07-31 | Discharge: 2014-07-31 | Disposition: A | Discharge status: Home / Self Care | Payer: Commercial Managed Care - HMO | Source: Ambulatory Visit | Attending: Cardiology | Admitting: Cardiology

## 2014-07-31 DIAGNOSIS — Z87891 Personal history of nicotine dependence: Secondary | ICD-10-CM | POA: Diagnosis not present

## 2014-07-31 DIAGNOSIS — I10 Essential (primary) hypertension: Secondary | ICD-10-CM | POA: Diagnosis not present

## 2014-07-31 DIAGNOSIS — Z5189 Encounter for other specified aftercare: Secondary | ICD-10-CM | POA: Diagnosis not present

## 2014-07-31 DIAGNOSIS — K219 Gastro-esophageal reflux disease without esophagitis: Secondary | ICD-10-CM | POA: Diagnosis not present

## 2014-07-31 DIAGNOSIS — Z7982 Long term (current) use of aspirin: Secondary | ICD-10-CM | POA: Diagnosis not present

## 2014-07-31 DIAGNOSIS — Z955 Presence of coronary angioplasty implant and graft: Secondary | ICD-10-CM | POA: Diagnosis not present

## 2014-07-31 DIAGNOSIS — E785 Hyperlipidemia, unspecified: Secondary | ICD-10-CM | POA: Diagnosis not present

## 2014-07-31 DIAGNOSIS — I25119 Atherosclerotic heart disease of native coronary artery with unspecified angina pectoris: Secondary | ICD-10-CM | POA: Diagnosis not present

## 2014-07-31 DIAGNOSIS — Z8673 Personal history of transient ischemic attack (TIA), and cerebral infarction without residual deficits: Secondary | ICD-10-CM | POA: Diagnosis not present

## 2014-08-02 ENCOUNTER — Ambulatory Visit (INDEPENDENT_AMBULATORY_CARE_PROVIDER_SITE_OTHER): Payer: Commercial Managed Care - HMO | Admitting: Cardiology

## 2014-08-02 ENCOUNTER — Encounter: Payer: Self-pay | Admitting: Cardiology

## 2014-08-02 ENCOUNTER — Encounter (HOSPITAL_COMMUNITY): Payer: Commercial Managed Care - HMO

## 2014-08-02 ENCOUNTER — Telehealth: Payer: Self-pay | Admitting: Cardiology

## 2014-08-02 ENCOUNTER — Encounter (HOSPITAL_COMMUNITY)
Admission: RE | Admit: 2014-08-02 | Discharge: 2014-08-02 | Disposition: A | Discharge status: Home / Self Care | Payer: Commercial Managed Care - HMO | Source: Ambulatory Visit | Attending: Cardiology | Admitting: Cardiology

## 2014-08-02 VITALS — BP 118/76 | HR 87 | Ht 67.0 in | Wt 205.0 lb

## 2014-08-02 DIAGNOSIS — Z955 Presence of coronary angioplasty implant and graft: Secondary | ICD-10-CM | POA: Diagnosis not present

## 2014-08-02 DIAGNOSIS — I25119 Atherosclerotic heart disease of native coronary artery with unspecified angina pectoris: Secondary | ICD-10-CM | POA: Diagnosis not present

## 2014-08-02 DIAGNOSIS — I10 Essential (primary) hypertension: Secondary | ICD-10-CM | POA: Diagnosis not present

## 2014-08-02 DIAGNOSIS — E781 Pure hyperglyceridemia: Secondary | ICD-10-CM | POA: Diagnosis not present

## 2014-08-02 DIAGNOSIS — I2583 Coronary atherosclerosis due to lipid rich plaque: Principal | ICD-10-CM

## 2014-08-02 DIAGNOSIS — Z87891 Personal history of nicotine dependence: Secondary | ICD-10-CM | POA: Diagnosis not present

## 2014-08-02 DIAGNOSIS — E785 Hyperlipidemia, unspecified: Secondary | ICD-10-CM | POA: Diagnosis not present

## 2014-08-02 DIAGNOSIS — I2510 Atherosclerotic heart disease of native coronary artery without angina pectoris: Secondary | ICD-10-CM

## 2014-08-02 DIAGNOSIS — Z8673 Personal history of transient ischemic attack (TIA), and cerebral infarction without residual deficits: Secondary | ICD-10-CM | POA: Diagnosis not present

## 2014-08-02 DIAGNOSIS — Z5189 Encounter for other specified aftercare: Secondary | ICD-10-CM | POA: Diagnosis not present

## 2014-08-02 DIAGNOSIS — Z7982 Long term (current) use of aspirin: Secondary | ICD-10-CM | POA: Diagnosis not present

## 2014-08-02 DIAGNOSIS — K219 Gastro-esophageal reflux disease without esophagitis: Secondary | ICD-10-CM | POA: Diagnosis not present

## 2014-08-02 MED ORDER — METOPROLOL TARTRATE 25 MG PO TABS: 25.0000 mg | ORAL_TABLET | Freq: Two times a day (BID) | ORAL | Status: DC

## 2014-08-02 MED ORDER — CLOPIDOGREL BISULFATE 75 MG PO TABS: 75.0000 mg | ORAL_TABLET | Freq: Every day | ORAL | Status: DC

## 2014-08-02 NOTE — Telephone Encounter (Signed)
Informed Juliann Pulse, [facility], that patient had OV today and was switched from Brilinta to Plavix. Juliann Pulse grateful for callback.

## 2014-08-02 NOTE — Telephone Encounter (Signed)
New Msg    Pt c/o medication issue:  1. Name of Medication: Plavix, Birlinta   2. How are you currently taking this medication (dosage and times per day)? 75mg  once a day, Birlinta 90 mg one twice daily   3. Are you having a reaction (difficulty breathing--STAT)? No  4. What is your medication issue? Pt should not take these together due to bleeding concerns. Pt picked up Plavix today and Birlinta last week.   Please contact pharmacy at [phone].

## 2014-08-02 NOTE — Patient Instructions (Signed)
Your physician has recommended you make the following change in your medication:  FINISH BRILINTA START PLAVIX ( 75 MG ) DAILY AFTER BRILINTA IS FINISHED  I REFILLED YOUR METOPROLOL TODAY   Your physician recommends that you return for a FASTING lipid profile: ON Friday, MARCH 18 BETWEEN 8-5   Your physician wants you to follow-up in: [place] will receive a reminder letter in the mail two months in advance. If you don't receive a letter, please call our office to schedule the follow-up appointment @ [phone]

## 2014-08-02 NOTE — Progress Notes (Signed)
Cardiology Office Note   Date:  08/02/2014   ID:  Carl Harrell, DOB 1945-11-09, MRN [id number]  PCP:  Joycelyn Man, MD  Cardiologist:   Sueanne Margarita, MD   Chief Complaint  Patient presents with  . Coronary Artery Disease  . Hypertension  . Hyperlipidemia      History of Present Illness: Carl Harrell is a 69 y.o. male who presents for FU of the above.   He has a history of prior TIA, HL, GERD, PAD. He recently saw me for exertional chest pain. Nuclear stress test was abnormal and read out as high risk. He was set up for cardiac catheterization.  Carl Harrell 05/29/14 demonstrated a subtotally occluded native RCA with 99% proximal stenosis which was treated with a DES. There was moderate stenosis noted in the LAD at 50-70%. There was normal anterior perfusion on nuclear stress test. Dr. Tamala Julian performed this procedure and noted that if he had continued anginal symptoms, consideration should be given towards repeating nuclear study to assess for anterior ischemia with an eye towards PCI of the LAD.   He is doing well since his PCI. He denies significant dyspnea. He denies any chest pain, LE edema, dizziness, palpitations or syncope.  He is NYHA 2. He denies orthopnea, PND, edema.    Past Medical History  Diagnosis Date  . ALLERGIC RHINITIS 01/30/2007  . GERD 09/26/2007  . HYPERLIPIDEMIA 09/26/2007  . PLANTAR FASCIITIS, BILATERAL 07/08/2009  . Unspecified hearing loss 06/16/2009    wears aides both ears  . ED (erectile dysfunction)   . Hypertension   . Arthritis     "hands" (05/29/2014)  . Coronary artery disease     LHC (05/29/14): pLAD 50-70, pRCA 99 (L>R collats), EF 55% >> PCI: 3.5 x 28 mm Xience Alpine DES to [facility]    Past Surgical History  Procedure Laterality Date  . Knee arthroscopy Right ~ 2005  . Coronary angioplasty with stent placement  05/29/2014    "1"  . Left heart catheterization with coronary angiogram N/A 05/29/2014    Procedure: LEFT HEART CATHETERIZATION  WITH CORONARY ANGIOGRAM;  Surgeon: Sinclair Grooms, MD;  Location: [facility] CATH LAB;  Service: Cardiovascular;  Laterality: N/A;  . Cardiac catheterization  05/29/2014    Procedure: CORONARY STENT INTERVENTION;  Surgeon: Sinclair Grooms, MD;  Location: [facility] CATH LAB;  Service: Cardiovascular;;  Prox RCA     Current Outpatient Prescriptions  Medication Sig Dispense Refill  . aspirin 81 MG chewable tablet Chew 1 tablet (81 mg total) by mouth daily.    . Cyanocobalamin (VITAMIN B 12 PO) Take 1 tablet by mouth daily.     . fexofenadine (ALLEGRA) 180 MG tablet Take 180 mg by mouth daily. Takes as needed for seasonal allergies spring/fall    . losartan-hydrochlorothiazide (HYZAAR) 50-12.5 MG per tablet Take 1 tablet by mouth daily. 90 tablet 0  . metoprolol tartrate (LOPRESSOR) 25 MG tablet Take 1 tablet (25 mg total) by mouth 2 (two) times daily. 180 tablet 1  . Multiple Vitamin (MULTIVITAMIN WITH MINERALS) TABS tablet Take 1 tablet by mouth daily.    . nitroGLYCERIN (NITROSTAT) 0.4 MG SL tablet Place 1 tablet (0.4 mg total) under the tongue every 5 (five) minutes as needed for chest pain. 25 tablet 12  . omeprazole (PRILOSEC) 20 MG capsule take 1 capsule by mouth twice a day 100 capsule 0  . simvastatin (ZOCOR) 40 MG tablet take 1 tablet by mouth at bedtime 90 tablet  0  . clopidogrel (PLAVIX) 75 MG tablet Take 1 tablet (75 mg total) by mouth daily. 90 tablet 3   No current facility-administered medications for this visit.    Allergies:   Review of patient's allergies indicates no known allergies.    Social History:  The patient  reports that he has quit smoking. His smoking use included Cigarettes. He has never used smokeless tobacco. He reports that he drinks about 1.2 oz of alcohol per week. He reports that he does not use illicit drugs.   Family History:  The patient's family history includes Heart attack in his father and mother; Heart disease in his father and mother. There is no history of  COPD or Hyperlipidemia.    ROS:  Please see the history of present illness.   Otherwise, review of systems are positive for none.   All other systems are reviewed and negative.    PHYSICAL EXAM: VS:  BP 118/76 mmHg  Pulse 87  Ht 5\' 7"  (1.702 m)  Wt 205 lb (92.987 kg)  BMI 32.10 kg/m2  SpO2 97% , BMI Body mass index is 32.1 kg/(m^2). GEN: Well nourished, well developed, in no acute distress HEENT: normal Neck: no JVD, carotid bruits, or masses Cardiac: RRR; no murmurs, rubs, or gallops,no edema  Respiratory:  clear to auscultation bilaterally, normal work of breathing GI: soft, nontender, nondistended, + BS MS: no deformity or atrophy Skin: warm and dry, no rash Neuro:  Strength and sensation are intact Psych: euthymic mood, full affect   EKG:  EKG is not ordered today.    Recent Labs: 03/14/2014: ALT 44; TSH 1.53 05/30/2014: BUN 12; Creatinine 1.19; Hemoglobin 14.1; Platelets 241; Potassium 3.9; Sodium 139    Lipid Panel    Component Value Date/Time   CHOL 159 03/14/2014 0802   TRIG 183.0* 03/14/2014 0802   HDL 34.80* 03/14/2014 0802   CHOLHDL 5 03/14/2014 0802   VLDL 36.6 03/14/2014 0802   LDLCALC 88 03/14/2014 0802      Wt Readings from Last 3 Encounters:  08/02/14 205 lb (92.987 kg)  07/04/14 209 lb 3.5 oz (94.9 kg)  06/20/14 209 lb (94.802 kg)      Other studies Reviewed: Additional studies/ records that were reviewed today include: left heart cath. Review of the above records demonstrates: LHC (05/29/14): pLAD 50-70, pRCA 99 (L>R collats), EF 55% >> PCI: 3.5 x 28 mm Xience Alpine DES to [place]:  1. Coronary Artery Disease: He is doing well after recent PCI to the RCA with a DES. He does have residual moderate disease in the LAD. However, he denies any further anginal symptoms.  He was instructed to call for earlier follow-up should he have recurrent symptoms that remind him of his previous angina. He is tolerating his current  medications. He is in cardiac rehab and doing well.  He mentioned to Cardiac rehab that he wanted to take a 15 mile hike [place] a [place] in a remote area to 5000 feet in May or June but himself.  I recommended that he not hike at high altitudes at this time due to underlying CAD as well as not hike alone in remote areas.    - Continue ASA, statin, beta blocker.     -  He would like to stop brilinta due to cost so I will change him to Plavix 75mg  daily.  2. Hypertension: Controlled. He is tolerating Metoprolol Tartrate, Hyzaar. 3. Hyperlipidemia: Managed by PCP. Continue moderate intensity statin.His last  LDL was not at goal (88).  I will set him up for an FLP and ALT.   4. Prior TIA: He notes increased strength on his right and improved balance since his PCI.      Current medicines are reviewed at length with the patient today.  The patient does not have concerns regarding medicines.  The following changes have been made:  no change  Labs/ tests ordered today include: none   Orders Placed This Encounter  Procedures  . Hepatic function panel  . Lipid Profile     Disposition:   FU with me in 6 months   Signed, Sueanne Margarita, MD  08/02/2014 9:27 AM    [facility] [address], [place], [place]  [address] Phone: [phone]; Fax: [phone]

## 2014-08-05 ENCOUNTER — Encounter (HOSPITAL_COMMUNITY): Payer: Commercial Managed Care - HMO

## 2014-08-05 ENCOUNTER — Encounter (HOSPITAL_COMMUNITY)
Admission: RE | Admit: 2014-08-05 | Discharge: 2014-08-05 | Disposition: A | Discharge status: Home / Self Care | Payer: Commercial Managed Care - HMO | Source: Ambulatory Visit | Attending: Cardiology | Admitting: Cardiology

## 2014-08-05 DIAGNOSIS — Z87891 Personal history of nicotine dependence: Secondary | ICD-10-CM | POA: Diagnosis not present

## 2014-08-05 DIAGNOSIS — I10 Essential (primary) hypertension: Secondary | ICD-10-CM | POA: Diagnosis not present

## 2014-08-05 DIAGNOSIS — Z5189 Encounter for other specified aftercare: Secondary | ICD-10-CM | POA: Diagnosis not present

## 2014-08-05 DIAGNOSIS — Z955 Presence of coronary angioplasty implant and graft: Secondary | ICD-10-CM | POA: Diagnosis not present

## 2014-08-05 DIAGNOSIS — E785 Hyperlipidemia, unspecified: Secondary | ICD-10-CM | POA: Diagnosis not present

## 2014-08-05 DIAGNOSIS — K219 Gastro-esophageal reflux disease without esophagitis: Secondary | ICD-10-CM | POA: Diagnosis not present

## 2014-08-05 DIAGNOSIS — Z8673 Personal history of transient ischemic attack (TIA), and cerebral infarction without residual deficits: Secondary | ICD-10-CM | POA: Diagnosis not present

## 2014-08-05 DIAGNOSIS — Z7982 Long term (current) use of aspirin: Secondary | ICD-10-CM | POA: Diagnosis not present

## 2014-08-05 DIAGNOSIS — I25119 Atherosclerotic heart disease of native coronary artery with unspecified angina pectoris: Secondary | ICD-10-CM | POA: Diagnosis not present

## 2014-08-07 ENCOUNTER — Encounter (HOSPITAL_COMMUNITY)
Admission: RE | Admit: 2014-08-07 | Discharge: 2014-08-07 | Disposition: A | Discharge status: Home / Self Care | Payer: Commercial Managed Care - HMO | Source: Ambulatory Visit | Attending: Cardiology | Admitting: Cardiology

## 2014-08-07 ENCOUNTER — Encounter (HOSPITAL_COMMUNITY): Payer: Commercial Managed Care - HMO

## 2014-08-07 DIAGNOSIS — Z87891 Personal history of nicotine dependence: Secondary | ICD-10-CM | POA: Diagnosis not present

## 2014-08-07 DIAGNOSIS — Z5189 Encounter for other specified aftercare: Secondary | ICD-10-CM | POA: Diagnosis not present

## 2014-08-07 DIAGNOSIS — Z7982 Long term (current) use of aspirin: Secondary | ICD-10-CM | POA: Diagnosis not present

## 2014-08-07 DIAGNOSIS — Z955 Presence of coronary angioplasty implant and graft: Secondary | ICD-10-CM | POA: Diagnosis not present

## 2014-08-07 DIAGNOSIS — Z8673 Personal history of transient ischemic attack (TIA), and cerebral infarction without residual deficits: Secondary | ICD-10-CM | POA: Diagnosis not present

## 2014-08-07 DIAGNOSIS — I10 Essential (primary) hypertension: Secondary | ICD-10-CM | POA: Diagnosis not present

## 2014-08-07 DIAGNOSIS — E785 Hyperlipidemia, unspecified: Secondary | ICD-10-CM | POA: Diagnosis not present

## 2014-08-07 DIAGNOSIS — K219 Gastro-esophageal reflux disease without esophagitis: Secondary | ICD-10-CM | POA: Diagnosis not present

## 2014-08-07 DIAGNOSIS — I25119 Atherosclerotic heart disease of native coronary artery with unspecified angina pectoris: Secondary | ICD-10-CM | POA: Diagnosis not present

## 2014-08-09 ENCOUNTER — Other Ambulatory Visit (INDEPENDENT_AMBULATORY_CARE_PROVIDER_SITE_OTHER): Payer: Commercial Managed Care - HMO | Admitting: *Deleted

## 2014-08-09 ENCOUNTER — Encounter (HOSPITAL_COMMUNITY)
Admission: RE | Admit: 2014-08-09 | Discharge: 2014-08-09 | Disposition: A | Discharge status: Home / Self Care | Payer: Commercial Managed Care - HMO | Source: Ambulatory Visit | Attending: Cardiology | Admitting: Cardiology

## 2014-08-09 ENCOUNTER — Encounter (HOSPITAL_COMMUNITY): Payer: Commercial Managed Care - HMO

## 2014-08-09 DIAGNOSIS — I10 Essential (primary) hypertension: Secondary | ICD-10-CM | POA: Diagnosis not present

## 2014-08-09 DIAGNOSIS — I25119 Atherosclerotic heart disease of native coronary artery with unspecified angina pectoris: Secondary | ICD-10-CM | POA: Diagnosis not present

## 2014-08-09 DIAGNOSIS — Z8673 Personal history of transient ischemic attack (TIA), and cerebral infarction without residual deficits: Secondary | ICD-10-CM | POA: Diagnosis not present

## 2014-08-09 DIAGNOSIS — Z955 Presence of coronary angioplasty implant and graft: Secondary | ICD-10-CM | POA: Diagnosis not present

## 2014-08-09 DIAGNOSIS — K219 Gastro-esophageal reflux disease without esophagitis: Secondary | ICD-10-CM | POA: Diagnosis not present

## 2014-08-09 DIAGNOSIS — Z7982 Long term (current) use of aspirin: Secondary | ICD-10-CM | POA: Diagnosis not present

## 2014-08-09 DIAGNOSIS — I2510 Atherosclerotic heart disease of native coronary artery without angina pectoris: Secondary | ICD-10-CM

## 2014-08-09 DIAGNOSIS — I2583 Coronary atherosclerosis due to lipid rich plaque: Principal | ICD-10-CM

## 2014-08-09 DIAGNOSIS — E781 Pure hyperglyceridemia: Secondary | ICD-10-CM | POA: Diagnosis not present

## 2014-08-09 DIAGNOSIS — E785 Hyperlipidemia, unspecified: Secondary | ICD-10-CM | POA: Diagnosis not present

## 2014-08-09 DIAGNOSIS — Z87891 Personal history of nicotine dependence: Secondary | ICD-10-CM | POA: Diagnosis not present

## 2014-08-09 DIAGNOSIS — Z5189 Encounter for other specified aftercare: Secondary | ICD-10-CM | POA: Diagnosis not present

## 2014-08-09 LAB — HEPATIC FUNCTION PANEL
ALK PHOS: 50 U/L (ref 39–117)
ALT: 31 U/L (ref 0–53)
AST: 20 U/L (ref 0–37)
Albumin: 4.1 g/dL (ref 3.5–5.2)
Bilirubin, Direct: 0.2 mg/dL (ref 0.0–0.3)
TOTAL PROTEIN: 7.1 g/dL (ref 6.0–8.3)
Total Bilirubin: 1 mg/dL (ref 0.2–1.2)

## 2014-08-09 LAB — LIPID PANEL
Cholesterol: 130 mg/dL (ref 0–200)
HDL: 37.9 mg/dL — AB (ref >39.00)
LDL CALC: 74 mg/dL (ref 0–99)
NonHDL: 92.1
TRIGLYCERIDES: 90 mg/dL (ref 0.0–149.0)
Total CHOL/HDL Ratio: 3
VLDL: 18 mg/dL (ref 0.0–40.0)

## 2014-08-12 ENCOUNTER — Encounter (HOSPITAL_COMMUNITY)
Admission: RE | Admit: 2014-08-12 | Discharge: 2014-08-12 | Disposition: A | Discharge status: Home / Self Care | Payer: Commercial Managed Care - HMO | Source: Ambulatory Visit | Attending: Cardiology | Admitting: Cardiology

## 2014-08-12 ENCOUNTER — Encounter (HOSPITAL_COMMUNITY): Payer: Commercial Managed Care - HMO

## 2014-08-12 ENCOUNTER — Encounter: Payer: Self-pay | Admitting: Cardiology

## 2014-08-12 DIAGNOSIS — E785 Hyperlipidemia, unspecified: Secondary | ICD-10-CM | POA: Diagnosis not present

## 2014-08-12 DIAGNOSIS — Z8673 Personal history of transient ischemic attack (TIA), and cerebral infarction without residual deficits: Secondary | ICD-10-CM | POA: Diagnosis not present

## 2014-08-12 DIAGNOSIS — Z7982 Long term (current) use of aspirin: Secondary | ICD-10-CM | POA: Diagnosis not present

## 2014-08-12 DIAGNOSIS — Z5189 Encounter for other specified aftercare: Secondary | ICD-10-CM | POA: Diagnosis not present

## 2014-08-12 DIAGNOSIS — K219 Gastro-esophageal reflux disease without esophagitis: Secondary | ICD-10-CM | POA: Diagnosis not present

## 2014-08-12 DIAGNOSIS — Z955 Presence of coronary angioplasty implant and graft: Secondary | ICD-10-CM | POA: Diagnosis not present

## 2014-08-12 DIAGNOSIS — Z87891 Personal history of nicotine dependence: Secondary | ICD-10-CM | POA: Diagnosis not present

## 2014-08-12 DIAGNOSIS — I25119 Atherosclerotic heart disease of native coronary artery with unspecified angina pectoris: Secondary | ICD-10-CM | POA: Diagnosis not present

## 2014-08-12 DIAGNOSIS — I10 Essential (primary) hypertension: Secondary | ICD-10-CM | POA: Diagnosis not present

## 2014-08-12 NOTE — Telephone Encounter (Signed)
New message ° ° ° ° ° °Calling to get test results °

## 2014-08-12 NOTE — Telephone Encounter (Signed)
This encounter was created in error - please disregard.

## 2014-08-14 ENCOUNTER — Encounter (HOSPITAL_COMMUNITY): Payer: Commercial Managed Care - HMO

## 2014-08-14 ENCOUNTER — Encounter (HOSPITAL_COMMUNITY)
Admission: RE | Admit: 2014-08-14 | Discharge: 2014-08-14 | Disposition: A | Discharge status: Home / Self Care | Payer: Commercial Managed Care - HMO | Source: Ambulatory Visit | Attending: Cardiology | Admitting: Cardiology

## 2014-08-14 DIAGNOSIS — Z7982 Long term (current) use of aspirin: Secondary | ICD-10-CM | POA: Diagnosis not present

## 2014-08-14 DIAGNOSIS — Z5189 Encounter for other specified aftercare: Secondary | ICD-10-CM | POA: Diagnosis not present

## 2014-08-14 DIAGNOSIS — Z955 Presence of coronary angioplasty implant and graft: Secondary | ICD-10-CM | POA: Diagnosis not present

## 2014-08-14 DIAGNOSIS — K219 Gastro-esophageal reflux disease without esophagitis: Secondary | ICD-10-CM | POA: Diagnosis not present

## 2014-08-14 DIAGNOSIS — E785 Hyperlipidemia, unspecified: Secondary | ICD-10-CM | POA: Diagnosis not present

## 2014-08-14 DIAGNOSIS — I25119 Atherosclerotic heart disease of native coronary artery with unspecified angina pectoris: Secondary | ICD-10-CM | POA: Diagnosis not present

## 2014-08-14 DIAGNOSIS — Z87891 Personal history of nicotine dependence: Secondary | ICD-10-CM | POA: Diagnosis not present

## 2014-08-14 DIAGNOSIS — Z8673 Personal history of transient ischemic attack (TIA), and cerebral infarction without residual deficits: Secondary | ICD-10-CM | POA: Diagnosis not present

## 2014-08-14 DIAGNOSIS — I10 Essential (primary) hypertension: Secondary | ICD-10-CM | POA: Diagnosis not present

## 2014-08-16 ENCOUNTER — Encounter (HOSPITAL_COMMUNITY)
Admission: RE | Admit: 2014-08-16 | Discharge: 2014-08-16 | Disposition: A | Discharge status: Home / Self Care | Payer: Commercial Managed Care - HMO | Source: Ambulatory Visit | Attending: Cardiology | Admitting: Cardiology

## 2014-08-16 ENCOUNTER — Encounter (HOSPITAL_COMMUNITY): Payer: Commercial Managed Care - HMO

## 2014-08-16 DIAGNOSIS — Z5189 Encounter for other specified aftercare: Secondary | ICD-10-CM | POA: Diagnosis not present

## 2014-08-16 DIAGNOSIS — Z8673 Personal history of transient ischemic attack (TIA), and cerebral infarction without residual deficits: Secondary | ICD-10-CM | POA: Diagnosis not present

## 2014-08-16 DIAGNOSIS — I10 Essential (primary) hypertension: Secondary | ICD-10-CM | POA: Diagnosis not present

## 2014-08-16 DIAGNOSIS — Z87891 Personal history of nicotine dependence: Secondary | ICD-10-CM | POA: Diagnosis not present

## 2014-08-16 DIAGNOSIS — Z955 Presence of coronary angioplasty implant and graft: Secondary | ICD-10-CM | POA: Diagnosis not present

## 2014-08-16 DIAGNOSIS — K219 Gastro-esophageal reflux disease without esophagitis: Secondary | ICD-10-CM | POA: Diagnosis not present

## 2014-08-16 DIAGNOSIS — I25119 Atherosclerotic heart disease of native coronary artery with unspecified angina pectoris: Secondary | ICD-10-CM | POA: Diagnosis not present

## 2014-08-16 DIAGNOSIS — E785 Hyperlipidemia, unspecified: Secondary | ICD-10-CM | POA: Diagnosis not present

## 2014-08-16 DIAGNOSIS — Z7982 Long term (current) use of aspirin: Secondary | ICD-10-CM | POA: Diagnosis not present

## 2014-08-19 ENCOUNTER — Encounter (HOSPITAL_COMMUNITY): Payer: Commercial Managed Care - HMO

## 2014-08-19 ENCOUNTER — Encounter (HOSPITAL_COMMUNITY)
Admission: RE | Admit: 2014-08-19 | Discharge: 2014-08-19 | Disposition: A | Discharge status: Home / Self Care | Payer: Commercial Managed Care - HMO | Source: Ambulatory Visit | Attending: Cardiology | Admitting: Cardiology

## 2014-08-19 DIAGNOSIS — Z955 Presence of coronary angioplasty implant and graft: Secondary | ICD-10-CM | POA: Diagnosis not present

## 2014-08-19 DIAGNOSIS — I10 Essential (primary) hypertension: Secondary | ICD-10-CM | POA: Diagnosis not present

## 2014-08-19 DIAGNOSIS — E785 Hyperlipidemia, unspecified: Secondary | ICD-10-CM | POA: Diagnosis not present

## 2014-08-19 DIAGNOSIS — I25119 Atherosclerotic heart disease of native coronary artery with unspecified angina pectoris: Secondary | ICD-10-CM | POA: Diagnosis not present

## 2014-08-19 DIAGNOSIS — Z8673 Personal history of transient ischemic attack (TIA), and cerebral infarction without residual deficits: Secondary | ICD-10-CM | POA: Diagnosis not present

## 2014-08-19 DIAGNOSIS — K219 Gastro-esophageal reflux disease without esophagitis: Secondary | ICD-10-CM | POA: Diagnosis not present

## 2014-08-19 DIAGNOSIS — Z87891 Personal history of nicotine dependence: Secondary | ICD-10-CM | POA: Diagnosis not present

## 2014-08-19 DIAGNOSIS — Z7982 Long term (current) use of aspirin: Secondary | ICD-10-CM | POA: Diagnosis not present

## 2014-08-19 DIAGNOSIS — Z5189 Encounter for other specified aftercare: Secondary | ICD-10-CM | POA: Diagnosis not present

## 2014-08-20 ENCOUNTER — Ambulatory Visit: Payer: Commercial Managed Care - HMO | Admitting: Family Medicine

## 2014-08-20 ENCOUNTER — Ambulatory Visit (INDEPENDENT_AMBULATORY_CARE_PROVIDER_SITE_OTHER): Payer: Commercial Managed Care - HMO | Admitting: Family Medicine

## 2014-08-20 ENCOUNTER — Encounter: Payer: Self-pay | Admitting: Family Medicine

## 2014-08-20 VITALS — BP 118/74 | HR 94 | Temp 98.0°F | Ht 67.0 in | Wt 202.7 lb

## 2014-08-20 DIAGNOSIS — J069 Acute upper respiratory infection, unspecified: Secondary | ICD-10-CM

## 2014-08-20 MED ORDER — PREDNISONE 20 MG PO TABS: 40.0000 mg | ORAL_TABLET | Freq: Every day | ORAL | Status: DC

## 2014-08-20 NOTE — Progress Notes (Signed)
HPI:  Rhinosinusitis: -started: allergies the last few weeks, worsened this weekened -symptoms:nasal congestion, sore throat, cough, PND, sneezing -denies:fever, SOB, NVD, tooth pain -has tried: benadryl - but used to take claritin -sick contacts/travel/risks: denies flu exposure, tick exposure or or Ebola risks -Hx of: allergies - reports get sick this time of the year every year  ROS: See pertinent positives and negatives per HPI.  Past Medical History  Diagnosis Date  . ALLERGIC RHINITIS 01/30/2007  . GERD 09/26/2007  . HYPERLIPIDEMIA 09/26/2007  . PLANTAR FASCIITIS, BILATERAL 07/08/2009  . Unspecified hearing loss 06/16/2009    wears aides both ears  . ED (erectile dysfunction)   . Hypertension   . Arthritis     "hands" (05/29/2014)  . Coronary artery disease     LHC (05/29/14): pLAD 50-70, pRCA 99 (L>R collats), EF 55% >> PCI: 3.5 x 28 mm Xience Alpine DES to [facility]    Past Surgical History  Procedure Laterality Date  . Knee arthroscopy Right ~ 2005  . Coronary angioplasty with stent placement  05/29/2014    "1"  . Left heart catheterization with coronary angiogram N/A 05/29/2014    Procedure: LEFT HEART CATHETERIZATION WITH CORONARY ANGIOGRAM;  Surgeon: Sinclair Grooms, MD;  Location: [facility] CATH LAB;  Service: Cardiovascular;  Laterality: N/A;  . Cardiac catheterization  05/29/2014    Procedure: CORONARY STENT INTERVENTION;  Surgeon: Sinclair Grooms, MD;  Location: [facility] CATH LAB;  Service: Cardiovascular;;  Prox RCA    Family History  Problem Relation Age of Onset  . COPD Neg Hx     family hx  . Hyperlipidemia Neg Hx     family hx  . Heart attack Mother   . Heart disease Mother   . Heart attack Father   . Heart disease Father     History   Social History  . Marital Status: Married    Spouse Name: N/A  . Number of Children: N/A  . Years of Education: N/A   Social History Main Topics  . Smoking status: Former Smoker    Types: Cigarettes  . Smokeless tobacco: Never  Used     Comment: 05/29/2014 "smoked socially; nothing since 1980"  . Alcohol Use: 1.2 oz/week    2 Shots of liquor, 0 Standard drinks or equivalent per week  . Drug Use: No  . Sexual Activity: Not Currently   Other Topics Concern  . None   Social History Narrative   Lives with wife in a one story home.  Has 2 children.  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  Education: 3 years of college.      Current outpatient prescriptions:  .  aspirin 81 MG chewable tablet, Chew 1 tablet (81 mg total) by mouth daily., Disp: , Rfl:  .  BRILINTA 90 MG TABS tablet, , Disp: , Rfl: 1 .  clopidogrel (PLAVIX) 75 MG tablet, Take 1 tablet (75 mg total) by mouth daily., Disp: 90 tablet, Rfl: 3 .  Cyanocobalamin (VITAMIN B 12 PO), Take 1 tablet by mouth daily. , Disp: , Rfl:  .  fexofenadine (ALLEGRA) 180 MG tablet, Take 180 mg by mouth daily. Takes as needed for seasonal allergies spring/fall, Disp: , Rfl:  .  losartan-hydrochlorothiazide (HYZAAR) 50-12.5 MG per tablet, Take 1 tablet by mouth daily., Disp: 90 tablet, Rfl: 0 .  metoprolol tartrate (LOPRESSOR) 25 MG tablet, Take 1 tablet (25 mg total) by mouth 2 (two) times daily., Disp: 180 tablet, Rfl: 1 .  Multiple Vitamin (MULTIVITAMIN WITH  MINERALS) TABS tablet, Take 1 tablet by mouth daily., Disp: , Rfl:  .  nitroGLYCERIN (NITROSTAT) 0.4 MG SL tablet, Place 1 tablet (0.4 mg total) under the tongue every 5 (five) minutes as needed for chest pain., Disp: 25 tablet, Rfl: 12 .  omeprazole (PRILOSEC) 20 MG capsule, take 1 capsule by mouth twice a day, Disp: 100 capsule, Rfl: 0 .  simvastatin (ZOCOR) 40 MG tablet, take 1 tablet by mouth at bedtime, Disp: 90 tablet, Rfl: 0 .  predniSONE (DELTASONE) 20 MG tablet, Take 2 tablets (40 mg total) by mouth daily with breakfast., Disp: 8 tablet, Rfl: 0  EXAM:  Filed Vitals:   08/20/14 1030  BP: 118/74  Pulse: 94  Temp: 98 F (36.7 C)    Body mass index is 31.74 kg/(m^2).  GENERAL: vitals reviewed and listed above, alert,  oriented, appears well hydrated and in no acute distress  HEENT: atraumatic, conjunttiva clear, no obvious abnormalities on inspection of external nose and ears, normal appearance of ear canals and TMs, clear nasal congestion, mild post oropharyngeal erythema with PND, no tonsillar edema or exudate, no sinus TTP  NECK: no obvious masses on inspection  LUNGS: clear to auscultation bilaterally, no wheezes, rales or rhonchi, good air movement  CV: HRRR, no peripheral edema  MS: moves all extremities without noticeable abnormality  PSYCH: pleasant and cooperative, no obvious depression or anxiety  ASSESSMENT AND PLAN:  Discussed the following assessment and plan:  Acute upper respiratory infection - Plan: predniSONE (DELTASONE) 20 MG tablet  -given HPI and exam findings today, a serious infection or illness is unlikely. We discussed potential etiologies, with allergic rhinosinusitis vs VURI being most likely, and advised supportive care and monitoring. We discussed treatment side effects, likely course, antibiotic misuse, transmission, and signs of developing a serious illness. -opted for short course prednisone and restarting claritin and flonase -of course, we advised to return or notify a doctor immediately if symptoms worsen or persist or new concerns arise.    Patient Instructions  Prednisone 40 mg daily for 4 days  Stop benadryl and restart the clartin daily and flonase daily during the allergy season  Follow up if worsening or not improving or other concerns     Contina Strain R.

## 2014-08-20 NOTE — Patient Instructions (Addendum)
Prednisone 40 mg daily for 4 days  Stop benadryl and restart the clartin daily and flonase daily during the allergy season  Follow up if worsening or not improving or other concerns

## 2014-08-20 NOTE — Progress Notes (Signed)
Pre visit review using our clinic review tool, if applicable. No additional management support is needed unless otherwise documented below in the visit note. 

## 2014-08-21 ENCOUNTER — Encounter (HOSPITAL_COMMUNITY)
Admission: RE | Admit: 2014-08-21 | Discharge: 2014-08-21 | Disposition: A | Discharge status: Home / Self Care | Payer: Commercial Managed Care - HMO | Source: Ambulatory Visit | Attending: Cardiology | Admitting: Cardiology

## 2014-08-21 ENCOUNTER — Encounter (HOSPITAL_COMMUNITY): Payer: Commercial Managed Care - HMO

## 2014-08-21 DIAGNOSIS — Z87891 Personal history of nicotine dependence: Secondary | ICD-10-CM | POA: Diagnosis not present

## 2014-08-21 DIAGNOSIS — E785 Hyperlipidemia, unspecified: Secondary | ICD-10-CM | POA: Diagnosis not present

## 2014-08-21 DIAGNOSIS — Z8673 Personal history of transient ischemic attack (TIA), and cerebral infarction without residual deficits: Secondary | ICD-10-CM | POA: Diagnosis not present

## 2014-08-21 DIAGNOSIS — K219 Gastro-esophageal reflux disease without esophagitis: Secondary | ICD-10-CM | POA: Diagnosis not present

## 2014-08-21 DIAGNOSIS — Z5189 Encounter for other specified aftercare: Secondary | ICD-10-CM | POA: Diagnosis not present

## 2014-08-21 DIAGNOSIS — Z955 Presence of coronary angioplasty implant and graft: Secondary | ICD-10-CM | POA: Diagnosis not present

## 2014-08-21 DIAGNOSIS — Z7982 Long term (current) use of aspirin: Secondary | ICD-10-CM | POA: Diagnosis not present

## 2014-08-21 DIAGNOSIS — I25119 Atherosclerotic heart disease of native coronary artery with unspecified angina pectoris: Secondary | ICD-10-CM | POA: Diagnosis not present

## 2014-08-21 DIAGNOSIS — I10 Essential (primary) hypertension: Secondary | ICD-10-CM | POA: Diagnosis not present

## 2014-08-23 ENCOUNTER — Encounter (HOSPITAL_COMMUNITY): Payer: Commercial Managed Care - HMO

## 2014-08-23 ENCOUNTER — Encounter (HOSPITAL_COMMUNITY)
Admission: RE | Admit: 2014-08-23 | Discharge: 2014-08-23 | Disposition: A | Discharge status: Home / Self Care | Payer: Commercial Managed Care - HMO | Source: Ambulatory Visit | Attending: Cardiology | Admitting: Cardiology

## 2014-08-23 DIAGNOSIS — I10 Essential (primary) hypertension: Secondary | ICD-10-CM | POA: Insufficient documentation

## 2014-08-23 DIAGNOSIS — I25119 Atherosclerotic heart disease of native coronary artery with unspecified angina pectoris: Secondary | ICD-10-CM | POA: Insufficient documentation

## 2014-08-23 DIAGNOSIS — K219 Gastro-esophageal reflux disease without esophagitis: Secondary | ICD-10-CM | POA: Insufficient documentation

## 2014-08-23 DIAGNOSIS — Z955 Presence of coronary angioplasty implant and graft: Secondary | ICD-10-CM | POA: Diagnosis not present

## 2014-08-23 DIAGNOSIS — E785 Hyperlipidemia, unspecified: Secondary | ICD-10-CM | POA: Diagnosis not present

## 2014-08-23 DIAGNOSIS — Z8673 Personal history of transient ischemic attack (TIA), and cerebral infarction without residual deficits: Secondary | ICD-10-CM | POA: Insufficient documentation

## 2014-08-23 DIAGNOSIS — Z87891 Personal history of nicotine dependence: Secondary | ICD-10-CM | POA: Diagnosis not present

## 2014-08-23 DIAGNOSIS — Z5189 Encounter for other specified aftercare: Secondary | ICD-10-CM | POA: Diagnosis not present

## 2014-08-23 DIAGNOSIS — Z7982 Long term (current) use of aspirin: Secondary | ICD-10-CM | POA: Insufficient documentation

## 2014-08-26 ENCOUNTER — Encounter (HOSPITAL_COMMUNITY): Payer: Commercial Managed Care - HMO

## 2014-08-26 ENCOUNTER — Encounter (HOSPITAL_COMMUNITY)
Admission: RE | Admit: 2014-08-26 | Discharge: 2014-08-26 | Disposition: A | Discharge status: Home / Self Care | Payer: Commercial Managed Care - HMO | Source: Ambulatory Visit | Attending: Cardiology | Admitting: Cardiology

## 2014-08-26 DIAGNOSIS — E785 Hyperlipidemia, unspecified: Secondary | ICD-10-CM | POA: Diagnosis not present

## 2014-08-26 DIAGNOSIS — Z87891 Personal history of nicotine dependence: Secondary | ICD-10-CM | POA: Diagnosis not present

## 2014-08-26 DIAGNOSIS — I10 Essential (primary) hypertension: Secondary | ICD-10-CM | POA: Diagnosis not present

## 2014-08-26 DIAGNOSIS — Z5189 Encounter for other specified aftercare: Secondary | ICD-10-CM | POA: Diagnosis not present

## 2014-08-26 DIAGNOSIS — Z8673 Personal history of transient ischemic attack (TIA), and cerebral infarction without residual deficits: Secondary | ICD-10-CM | POA: Diagnosis not present

## 2014-08-26 DIAGNOSIS — Z955 Presence of coronary angioplasty implant and graft: Secondary | ICD-10-CM | POA: Diagnosis not present

## 2014-08-26 DIAGNOSIS — Z7982 Long term (current) use of aspirin: Secondary | ICD-10-CM | POA: Diagnosis not present

## 2014-08-26 DIAGNOSIS — K219 Gastro-esophageal reflux disease without esophagitis: Secondary | ICD-10-CM | POA: Diagnosis not present

## 2014-08-26 DIAGNOSIS — I25119 Atherosclerotic heart disease of native coronary artery with unspecified angina pectoris: Secondary | ICD-10-CM | POA: Diagnosis not present

## 2014-08-28 ENCOUNTER — Encounter (HOSPITAL_COMMUNITY)
Admission: RE | Admit: 2014-08-28 | Discharge: 2014-08-28 | Disposition: A | Discharge status: Home / Self Care | Payer: Commercial Managed Care - HMO | Source: Ambulatory Visit | Attending: Cardiology | Admitting: Cardiology

## 2014-08-28 ENCOUNTER — Encounter (HOSPITAL_COMMUNITY): Payer: Commercial Managed Care - HMO

## 2014-08-28 DIAGNOSIS — Z7982 Long term (current) use of aspirin: Secondary | ICD-10-CM | POA: Diagnosis not present

## 2014-08-28 DIAGNOSIS — Z8673 Personal history of transient ischemic attack (TIA), and cerebral infarction without residual deficits: Secondary | ICD-10-CM | POA: Diagnosis not present

## 2014-08-28 DIAGNOSIS — K219 Gastro-esophageal reflux disease without esophagitis: Secondary | ICD-10-CM | POA: Diagnosis not present

## 2014-08-28 DIAGNOSIS — I25119 Atherosclerotic heart disease of native coronary artery with unspecified angina pectoris: Secondary | ICD-10-CM | POA: Diagnosis not present

## 2014-08-28 DIAGNOSIS — Z955 Presence of coronary angioplasty implant and graft: Secondary | ICD-10-CM | POA: Diagnosis not present

## 2014-08-28 DIAGNOSIS — E785 Hyperlipidemia, unspecified: Secondary | ICD-10-CM | POA: Diagnosis not present

## 2014-08-28 DIAGNOSIS — Z5189 Encounter for other specified aftercare: Secondary | ICD-10-CM | POA: Diagnosis not present

## 2014-08-28 DIAGNOSIS — Z87891 Personal history of nicotine dependence: Secondary | ICD-10-CM | POA: Diagnosis not present

## 2014-08-28 DIAGNOSIS — I10 Essential (primary) hypertension: Secondary | ICD-10-CM | POA: Diagnosis not present

## 2014-08-30 ENCOUNTER — Encounter (HOSPITAL_COMMUNITY)
Admission: RE | Admit: 2014-08-30 | Discharge: 2014-08-30 | Disposition: A | Discharge status: Home / Self Care | Payer: Commercial Managed Care - HMO | Source: Ambulatory Visit | Attending: Cardiology | Admitting: Cardiology

## 2014-08-30 ENCOUNTER — Encounter (HOSPITAL_COMMUNITY): Payer: Commercial Managed Care - HMO

## 2014-08-30 DIAGNOSIS — Z87891 Personal history of nicotine dependence: Secondary | ICD-10-CM | POA: Diagnosis not present

## 2014-08-30 DIAGNOSIS — I25119 Atherosclerotic heart disease of native coronary artery with unspecified angina pectoris: Secondary | ICD-10-CM | POA: Diagnosis not present

## 2014-08-30 DIAGNOSIS — I10 Essential (primary) hypertension: Secondary | ICD-10-CM | POA: Diagnosis not present

## 2014-08-30 DIAGNOSIS — K219 Gastro-esophageal reflux disease without esophagitis: Secondary | ICD-10-CM | POA: Diagnosis not present

## 2014-08-30 DIAGNOSIS — E785 Hyperlipidemia, unspecified: Secondary | ICD-10-CM | POA: Diagnosis not present

## 2014-08-30 DIAGNOSIS — Z955 Presence of coronary angioplasty implant and graft: Secondary | ICD-10-CM | POA: Diagnosis not present

## 2014-08-30 DIAGNOSIS — Z5189 Encounter for other specified aftercare: Secondary | ICD-10-CM | POA: Diagnosis not present

## 2014-08-30 DIAGNOSIS — Z8673 Personal history of transient ischemic attack (TIA), and cerebral infarction without residual deficits: Secondary | ICD-10-CM | POA: Diagnosis not present

## 2014-08-30 DIAGNOSIS — Z7982 Long term (current) use of aspirin: Secondary | ICD-10-CM | POA: Diagnosis not present

## 2014-09-02 ENCOUNTER — Encounter (HOSPITAL_COMMUNITY): Payer: Commercial Managed Care - HMO

## 2014-09-02 ENCOUNTER — Encounter (HOSPITAL_COMMUNITY)
Admission: RE | Admit: 2014-09-02 | Discharge: 2014-09-02 | Disposition: A | Discharge status: Home / Self Care | Payer: Commercial Managed Care - HMO | Source: Ambulatory Visit | Attending: Cardiology | Admitting: Cardiology

## 2014-09-02 DIAGNOSIS — Z87891 Personal history of nicotine dependence: Secondary | ICD-10-CM | POA: Diagnosis not present

## 2014-09-02 DIAGNOSIS — I10 Essential (primary) hypertension: Secondary | ICD-10-CM | POA: Diagnosis not present

## 2014-09-02 DIAGNOSIS — Z955 Presence of coronary angioplasty implant and graft: Secondary | ICD-10-CM | POA: Diagnosis not present

## 2014-09-02 DIAGNOSIS — E785 Hyperlipidemia, unspecified: Secondary | ICD-10-CM | POA: Diagnosis not present

## 2014-09-02 DIAGNOSIS — I25119 Atherosclerotic heart disease of native coronary artery with unspecified angina pectoris: Secondary | ICD-10-CM | POA: Diagnosis not present

## 2014-09-02 DIAGNOSIS — Z5189 Encounter for other specified aftercare: Secondary | ICD-10-CM | POA: Diagnosis not present

## 2014-09-02 DIAGNOSIS — K219 Gastro-esophageal reflux disease without esophagitis: Secondary | ICD-10-CM | POA: Diagnosis not present

## 2014-09-02 DIAGNOSIS — Z7982 Long term (current) use of aspirin: Secondary | ICD-10-CM | POA: Diagnosis not present

## 2014-09-02 DIAGNOSIS — Z8673 Personal history of transient ischemic attack (TIA), and cerebral infarction without residual deficits: Secondary | ICD-10-CM | POA: Diagnosis not present

## 2014-09-04 ENCOUNTER — Encounter (HOSPITAL_COMMUNITY)
Admission: RE | Admit: 2014-09-04 | Discharge: 2014-09-04 | Disposition: A | Discharge status: Home / Self Care | Payer: Commercial Managed Care - HMO | Source: Ambulatory Visit | Attending: Cardiology | Admitting: Cardiology

## 2014-09-04 ENCOUNTER — Encounter (HOSPITAL_COMMUNITY): Payer: Commercial Managed Care - HMO

## 2014-09-04 DIAGNOSIS — Z8673 Personal history of transient ischemic attack (TIA), and cerebral infarction without residual deficits: Secondary | ICD-10-CM | POA: Diagnosis not present

## 2014-09-04 DIAGNOSIS — E785 Hyperlipidemia, unspecified: Secondary | ICD-10-CM | POA: Diagnosis not present

## 2014-09-04 DIAGNOSIS — Z955 Presence of coronary angioplasty implant and graft: Secondary | ICD-10-CM | POA: Diagnosis not present

## 2014-09-04 DIAGNOSIS — Z87891 Personal history of nicotine dependence: Secondary | ICD-10-CM | POA: Diagnosis not present

## 2014-09-04 DIAGNOSIS — I25119 Atherosclerotic heart disease of native coronary artery with unspecified angina pectoris: Secondary | ICD-10-CM | POA: Diagnosis not present

## 2014-09-04 DIAGNOSIS — Z7982 Long term (current) use of aspirin: Secondary | ICD-10-CM | POA: Diagnosis not present

## 2014-09-04 DIAGNOSIS — Z5189 Encounter for other specified aftercare: Secondary | ICD-10-CM | POA: Diagnosis not present

## 2014-09-04 DIAGNOSIS — K219 Gastro-esophageal reflux disease without esophagitis: Secondary | ICD-10-CM | POA: Diagnosis not present

## 2014-09-04 DIAGNOSIS — I10 Essential (primary) hypertension: Secondary | ICD-10-CM | POA: Diagnosis not present

## 2014-09-05 ENCOUNTER — Other Ambulatory Visit: Payer: Self-pay | Admitting: Family Medicine

## 2014-09-06 ENCOUNTER — Encounter (HOSPITAL_COMMUNITY)
Admission: RE | Admit: 2014-09-06 | Discharge: 2014-09-06 | Disposition: A | Discharge status: Home / Self Care | Payer: Commercial Managed Care - HMO | Source: Ambulatory Visit | Attending: Cardiology | Admitting: Cardiology

## 2014-09-06 ENCOUNTER — Encounter (HOSPITAL_COMMUNITY): Payer: Commercial Managed Care - HMO

## 2014-09-06 DIAGNOSIS — I25119 Atherosclerotic heart disease of native coronary artery with unspecified angina pectoris: Secondary | ICD-10-CM | POA: Diagnosis not present

## 2014-09-06 DIAGNOSIS — I10 Essential (primary) hypertension: Secondary | ICD-10-CM | POA: Diagnosis not present

## 2014-09-06 DIAGNOSIS — K219 Gastro-esophageal reflux disease without esophagitis: Secondary | ICD-10-CM | POA: Diagnosis not present

## 2014-09-06 DIAGNOSIS — Z87891 Personal history of nicotine dependence: Secondary | ICD-10-CM | POA: Diagnosis not present

## 2014-09-06 DIAGNOSIS — Z5189 Encounter for other specified aftercare: Secondary | ICD-10-CM | POA: Diagnosis not present

## 2014-09-06 DIAGNOSIS — E785 Hyperlipidemia, unspecified: Secondary | ICD-10-CM | POA: Diagnosis not present

## 2014-09-06 DIAGNOSIS — Z7982 Long term (current) use of aspirin: Secondary | ICD-10-CM | POA: Diagnosis not present

## 2014-09-06 DIAGNOSIS — Z955 Presence of coronary angioplasty implant and graft: Secondary | ICD-10-CM | POA: Diagnosis not present

## 2014-09-06 DIAGNOSIS — Z8673 Personal history of transient ischemic attack (TIA), and cerebral infarction without residual deficits: Secondary | ICD-10-CM | POA: Diagnosis not present

## 2014-09-09 ENCOUNTER — Encounter (HOSPITAL_COMMUNITY): Payer: Commercial Managed Care - HMO

## 2014-09-09 ENCOUNTER — Encounter (HOSPITAL_COMMUNITY)
Admission: RE | Admit: 2014-09-09 | Discharge: 2014-09-09 | Disposition: A | Discharge status: Home / Self Care | Payer: Commercial Managed Care - HMO | Source: Ambulatory Visit | Attending: Cardiology | Admitting: Cardiology

## 2014-09-09 DIAGNOSIS — Z87891 Personal history of nicotine dependence: Secondary | ICD-10-CM | POA: Diagnosis not present

## 2014-09-09 DIAGNOSIS — K219 Gastro-esophageal reflux disease without esophagitis: Secondary | ICD-10-CM | POA: Diagnosis not present

## 2014-09-09 DIAGNOSIS — E785 Hyperlipidemia, unspecified: Secondary | ICD-10-CM | POA: Diagnosis not present

## 2014-09-09 DIAGNOSIS — I10 Essential (primary) hypertension: Secondary | ICD-10-CM | POA: Diagnosis not present

## 2014-09-09 DIAGNOSIS — Z8673 Personal history of transient ischemic attack (TIA), and cerebral infarction without residual deficits: Secondary | ICD-10-CM | POA: Diagnosis not present

## 2014-09-09 DIAGNOSIS — Z5189 Encounter for other specified aftercare: Secondary | ICD-10-CM | POA: Diagnosis not present

## 2014-09-09 DIAGNOSIS — Z955 Presence of coronary angioplasty implant and graft: Secondary | ICD-10-CM | POA: Diagnosis not present

## 2014-09-09 DIAGNOSIS — I25119 Atherosclerotic heart disease of native coronary artery with unspecified angina pectoris: Secondary | ICD-10-CM | POA: Diagnosis not present

## 2014-09-09 DIAGNOSIS — Z7982 Long term (current) use of aspirin: Secondary | ICD-10-CM | POA: Diagnosis not present

## 2014-09-11 ENCOUNTER — Encounter (HOSPITAL_COMMUNITY)
Admission: RE | Admit: 2014-09-11 | Discharge: 2014-09-11 | Disposition: A | Discharge status: Home / Self Care | Payer: Commercial Managed Care - HMO | Source: Ambulatory Visit | Attending: Cardiology | Admitting: Cardiology

## 2014-09-11 ENCOUNTER — Encounter (HOSPITAL_COMMUNITY): Payer: Commercial Managed Care - HMO

## 2014-09-11 DIAGNOSIS — I10 Essential (primary) hypertension: Secondary | ICD-10-CM | POA: Diagnosis not present

## 2014-09-11 DIAGNOSIS — Z87891 Personal history of nicotine dependence: Secondary | ICD-10-CM | POA: Diagnosis not present

## 2014-09-11 DIAGNOSIS — I25119 Atherosclerotic heart disease of native coronary artery with unspecified angina pectoris: Secondary | ICD-10-CM | POA: Diagnosis not present

## 2014-09-11 DIAGNOSIS — K219 Gastro-esophageal reflux disease without esophagitis: Secondary | ICD-10-CM | POA: Diagnosis not present

## 2014-09-11 DIAGNOSIS — Z955 Presence of coronary angioplasty implant and graft: Secondary | ICD-10-CM | POA: Diagnosis not present

## 2014-09-11 DIAGNOSIS — Z5189 Encounter for other specified aftercare: Secondary | ICD-10-CM | POA: Diagnosis not present

## 2014-09-11 DIAGNOSIS — E785 Hyperlipidemia, unspecified: Secondary | ICD-10-CM | POA: Diagnosis not present

## 2014-09-11 DIAGNOSIS — Z8673 Personal history of transient ischemic attack (TIA), and cerebral infarction without residual deficits: Secondary | ICD-10-CM | POA: Diagnosis not present

## 2014-09-11 DIAGNOSIS — Z7982 Long term (current) use of aspirin: Secondary | ICD-10-CM | POA: Diagnosis not present

## 2014-09-13 ENCOUNTER — Encounter (HOSPITAL_COMMUNITY): Payer: Commercial Managed Care - HMO

## 2014-09-13 ENCOUNTER — Encounter (HOSPITAL_COMMUNITY)
Admission: RE | Admit: 2014-09-13 | Discharge: 2014-09-13 | Disposition: A | Discharge status: Home / Self Care | Payer: Commercial Managed Care - HMO | Source: Ambulatory Visit | Attending: Cardiology | Admitting: Cardiology

## 2014-09-13 DIAGNOSIS — I10 Essential (primary) hypertension: Secondary | ICD-10-CM | POA: Diagnosis not present

## 2014-09-13 DIAGNOSIS — I25119 Atherosclerotic heart disease of native coronary artery with unspecified angina pectoris: Secondary | ICD-10-CM | POA: Diagnosis not present

## 2014-09-13 DIAGNOSIS — Z8673 Personal history of transient ischemic attack (TIA), and cerebral infarction without residual deficits: Secondary | ICD-10-CM | POA: Diagnosis not present

## 2014-09-13 DIAGNOSIS — K219 Gastro-esophageal reflux disease without esophagitis: Secondary | ICD-10-CM | POA: Diagnosis not present

## 2014-09-13 DIAGNOSIS — Z7982 Long term (current) use of aspirin: Secondary | ICD-10-CM | POA: Diagnosis not present

## 2014-09-13 DIAGNOSIS — Z5189 Encounter for other specified aftercare: Secondary | ICD-10-CM | POA: Diagnosis not present

## 2014-09-13 DIAGNOSIS — E785 Hyperlipidemia, unspecified: Secondary | ICD-10-CM | POA: Diagnosis not present

## 2014-09-13 DIAGNOSIS — Z87891 Personal history of nicotine dependence: Secondary | ICD-10-CM | POA: Diagnosis not present

## 2014-09-13 DIAGNOSIS — Z955 Presence of coronary angioplasty implant and graft: Secondary | ICD-10-CM | POA: Diagnosis not present

## 2014-09-13 NOTE — Progress Notes (Signed)
Pt graduates today from the phase II cardiac rehab program with the completion of 29 exercise sessions.  Pt opted to graduate early due to leaving on tomorrow for extensive travel and would not return back to [place] until June.  Pt maintained excellent attendance to education and exercise class.  Pt increased his MET level from 2.2 to 3.7.  Pt increased his score on the post knowledge test to 100% correct!  Pt demonstrates mastery of heart healthy concepts. Pt tolerated exercise with no complaints but did have some shortness of breath during the peak of the allergy season. Vital signs remained within normal limits.  Pt plans to continue his exercise program at the [facility].  Pt has recently added this to his regimen on the days he didn't exercise in rehab.  Pt aims for 3-4 days a week.  Pt met his goal for knowing what he can do.  He reports since participating in cardiac rehab he has improved memory, energy and stamina.  Pt remarks that when he first started the program he felt like he was in a fog.  "the fog has lifted" and pt is able to return doing the activities he finds pleasurable. Repeat Psychosocial Assessment - Repeat PHQ2 score 0.  Pt denies any depression and feels positive and demonstrates appropriate coping skills.  No further needs identified. Post quality of life survey reviewed.  Pt remarks some continued low areas but overall showed improvement in many areas.   It was a pleasure to watch the patients transformation from the beginning to this point and see the growth that occurred over the last 3 months. Cherre Huger, BSN

## 2014-09-16 ENCOUNTER — Encounter (HOSPITAL_COMMUNITY): Payer: Commercial Managed Care - HMO

## 2014-09-18 ENCOUNTER — Encounter (HOSPITAL_COMMUNITY): Payer: Commercial Managed Care - HMO

## 2014-09-20 ENCOUNTER — Encounter (HOSPITAL_COMMUNITY): Payer: Commercial Managed Care - HMO

## 2014-09-23 ENCOUNTER — Encounter (HOSPITAL_COMMUNITY): Payer: Medicare HMO

## 2014-09-25 ENCOUNTER — Encounter (HOSPITAL_COMMUNITY): Payer: Medicare HMO

## 2014-09-27 ENCOUNTER — Encounter (HOSPITAL_COMMUNITY): Payer: Medicare HMO

## 2014-09-30 ENCOUNTER — Encounter (HOSPITAL_COMMUNITY): Payer: Medicare HMO

## 2014-10-02 ENCOUNTER — Encounter (HOSPITAL_COMMUNITY): Payer: Medicare HMO

## 2014-10-04 ENCOUNTER — Encounter (HOSPITAL_COMMUNITY): Payer: Medicare HMO

## 2014-10-07 ENCOUNTER — Encounter (HOSPITAL_COMMUNITY): Payer: Medicare HMO

## 2014-10-09 ENCOUNTER — Encounter (HOSPITAL_COMMUNITY): Payer: Medicare HMO

## 2014-10-11 ENCOUNTER — Encounter (HOSPITAL_COMMUNITY): Payer: Medicare HMO

## 2014-10-30 ENCOUNTER — Telehealth: Payer: Self-pay | Admitting: Neurology

## 2014-10-30 NOTE — Telephone Encounter (Signed)
Sherri there is not results or record of this patient having a MRI T spine done if there is not a result report or a encounter call note with the results it is safe to say it was not done . You can also ask forward to Dr Tomi Likens it there are results on a patient for the MRI .

## 2014-10-30 NOTE — Telephone Encounter (Signed)
Carl Harrell - Pt was ordered a MRI in December '15. I do not see that the patient went to have this done. Please follow up on this and let me know. i am working to get this out of the [employer].

## 2014-10-31 NOTE — Telephone Encounter (Signed)
Both studies were done.

## 2014-10-31 NOTE — Telephone Encounter (Signed)
See below, pt has not had MRI done / Carl S.

## 2014-11-01 ENCOUNTER — Encounter: Payer: Self-pay | Admitting: Cardiology

## 2014-11-07 ENCOUNTER — Other Ambulatory Visit: Payer: Self-pay | Admitting: Family Medicine

## 2014-11-13 ENCOUNTER — Encounter: Payer: Self-pay | Admitting: Family Medicine

## 2014-11-13 ENCOUNTER — Ambulatory Visit (INDEPENDENT_AMBULATORY_CARE_PROVIDER_SITE_OTHER): Payer: Commercial Managed Care - HMO | Admitting: Family Medicine

## 2014-11-13 VITALS — BP 118/78 | HR 92 | Temp 97.6°F | Ht 67.0 in | Wt 203.8 lb

## 2014-11-13 DIAGNOSIS — L255 Unspecified contact dermatitis due to plants, except food: Secondary | ICD-10-CM | POA: Diagnosis not present

## 2014-11-13 MED ORDER — PREDNISONE 10 MG PO TABS: ORAL_TABLET | ORAL | Status: DC

## 2014-11-13 NOTE — Progress Notes (Signed)
HPI:  Acute visit for:  Rash: -started: a few days ago -symptoms: itchy papulovesicular rash on arms, legs, cheek, trunk - thinks got into poison ivy or oak as has had this several times in the past -new exposures: see above -denies: sob, DOE, lesions in eyes, nose or mouth, swelling of throat or neck -hx of: toxicodendron dermatitis -treatment: prednisone taper in the past  ROS: See pertinent positives and negatives per HPI.  Past Medical History  Diagnosis Date  . ALLERGIC RHINITIS 01/30/2007  . GERD 09/26/2007  . HYPERLIPIDEMIA 09/26/2007  . PLANTAR FASCIITIS, BILATERAL 07/08/2009  . Unspecified hearing loss 06/16/2009    wears aides both ears  . ED (erectile dysfunction)   . Hypertension   . Arthritis     "hands" (05/29/2014)  . Coronary artery disease     LHC (05/29/14): pLAD 50-70, pRCA 99 (L>R collats), EF 55% >> PCI: 3.5 x 28 mm Xience Alpine DES to [facility]    Past Surgical History  Procedure Laterality Date  . Knee arthroscopy Right ~ 2005  . Coronary angioplasty with stent placement  05/29/2014    "1"  . Left heart catheterization with coronary angiogram N/A 05/29/2014    Procedure: LEFT HEART CATHETERIZATION WITH CORONARY ANGIOGRAM;  Surgeon: Sinclair Grooms, MD;  Location: [facility] CATH LAB;  Service: Cardiovascular;  Laterality: N/A;  . Cardiac catheterization  05/29/2014    Procedure: CORONARY STENT INTERVENTION;  Surgeon: Sinclair Grooms, MD;  Location: [facility] CATH LAB;  Service: Cardiovascular;;  Prox RCA    Family History  Problem Relation Age of Onset  . COPD Neg Hx     family hx  . Hyperlipidemia Neg Hx     family hx  . Heart attack Mother   . Heart disease Mother   . Heart attack Father   . Heart disease Father     History   Social History  . Marital Status: Married    Spouse Name: N/A  . Number of Children: N/A  . Years of Education: N/A   Social History Main Topics  . Smoking status: Former Smoker    Types: Cigarettes  . Smokeless tobacco: Never Used    Comment: 05/29/2014 "smoked socially; nothing since 1980"  . Alcohol Use: 1.2 oz/week    2 Shots of liquor, 0 Standard drinks or equivalent per week  . Drug Use: No  . Sexual Activity: Not Currently   Other Topics Concern  . None   Social History Narrative   Lives with wife in a one story home.  Has 2 children.  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  Education: 3 years of college.      Current outpatient prescriptions:  .  aspirin 81 MG chewable tablet, Chew 1 tablet (81 mg total) by mouth daily., Disp: , Rfl:  .  BRILINTA 90 MG TABS tablet, , Disp: , Rfl: 1 .  clopidogrel (PLAVIX) 75 MG tablet, Take 1 tablet (75 mg total) by mouth daily., Disp: 90 tablet, Rfl: 3 .  Cyanocobalamin (VITAMIN B 12 PO), Take 1 tablet by mouth daily. , Disp: , Rfl:  .  fexofenadine (ALLEGRA) 180 MG tablet, Take 180 mg by mouth daily. Takes as needed for seasonal allergies spring/fall, Disp: , Rfl:  .  losartan-hydrochlorothiazide (HYZAAR) 50-12.5 MG per tablet, TAKE 1 TABLET EVERY DAY, Disp: 90 tablet, Rfl: 0 .  metoprolol tartrate (LOPRESSOR) 25 MG tablet, TAKE 1 TABLET TWICE DAILY, Disp: 180 tablet, Rfl: 0 .  Multiple Vitamin (MULTIVITAMIN WITH MINERALS) TABS tablet,  Take 1 tablet by mouth daily., Disp: , Rfl:  .  nitroGLYCERIN (NITROSTAT) 0.4 MG SL tablet, Place 1 tablet (0.4 mg total) under the tongue every 5 (five) minutes as needed for chest pain., Disp: 25 tablet, Rfl: 12 .  omeprazole (PRILOSEC) 20 MG capsule, TAKE 1 CAPSULE TWICE DAILY, Disp: 180 capsule, Rfl: 0 .  simvastatin (ZOCOR) 40 MG tablet, TAKE 1 TABLET AT BEDTIME, Disp: 90 tablet, Rfl: 0 .  predniSONE (DELTASONE) 10 MG tablet, 5 tablets ('50mg'$ ) daily for 3 days, then 4 tablets (40 mg) daily for 3 days, then 3 tablets ('30mg'$ ) daily for 3 days, then 2 tablets (20 mg) daily for 3 days, then 1 tablet (10 mg) daily for 3 days., Disp: 45 tablet, Rfl: 0  EXAM:  Filed Vitals:   11/13/14 1010  BP: 118/78  Pulse: 92  Temp: 97.6 F (36.4 C)    Body mass index  is 31.91 kg/(m^2).  GENERAL: vitals reviewed and listed above, alert, oriented, appears well hydrated and in no acute distress  HEENT: atraumatic, conjunttiva clear, no obvious abnormalities on inspection of external nose and ears  NECK: no obvious masses on inspection  LUNGS: clear to auscultation bilaterally, no wheezes, rales or rhonchi, good air movement  CV: HRRR, no peripheral edema  SKIN:  papulovesicular rash in streaky pattern on arms, legs, trunk, L chin  MS: moves all extremities without noticeable abnormality  PSYCH: pleasant and cooperative, no obvious depression or anxiety  ASSESSMENT AND PLAN:  Discussed the following assessment and plan:  Toxicodendron dermatitis  -opted for treatment with prednisone given widespread rash and small patch of face - risks and return precautions discussed -Patient advised to return or notify a doctor immediately if symptoms worsen or persist or new concerns arise.  Patient Instructions  Poison [facility] ivy is a inflammation of the skin (contact dermatitis) caused by touching the allergens on the leaves of the ivy plant following previous exposure to the plant. The rash usually appears 48 hours after exposure. The rash is usually bumps (papules) or blisters (vesicles) in a linear pattern. Depending on your own sensitivity, the rash may simply cause redness and itching, or it may also progress to blisters which may break open. These must be well cared for to prevent secondary bacterial (germ) infection, followed by scarring. Keep any open areas dry, clean, dressed, and covered with an antibacterial ointment if needed. The eyes may also get puffy. The puffiness is worst in the morning and gets better as the day progresses. This dermatitis usually heals without scarring, within 2 to 3 weeks without treatment. HOME CARE INSTRUCTIONS  Thoroughly wash with soap and water as soon as you have been exposed to poison ivy. You have about one half  hour to remove the plant resin before it will cause the rash. This washing will destroy the oil or antigen on the skin that is causing, or will cause, the rash. Be sure to wash under your fingernails as any plant resin there will continue to spread the rash. Do not rub skin vigorously when washing affected area. Poison ivy cannot spread if no oil from the plant remains on your body. A rash that has progressed to weeping sores will not spread the rash unless you have not washed thoroughly. It is also important to wash any clothes you have been wearing as these may carry active allergens. The rash will return if you wear the unwashed clothing, even several days later. Avoidance of the plant in the  future is the best measure. Poison ivy plant can be recognized by the number of leaves. Generally, poison ivy has three leaves with flowering branches on a single stem. Diphenhydramine may be purchased over the counter and used as needed for itching. Do not drive with this medication if it makes you drowsy.Ask your caregiver about medication for children. SEEK MEDICAL CARE IF:  Open sores develop.  Redness spreads beyond area of rash.  You notice purulent (pus-like) discharge.  You have increased pain.  Other signs of infection develop (such as fever). Document Released: 05/07/2000 Document Revised: 08/02/2011 Document Reviewed: 10/18/2008 [facility] Patient Information 2015 [place], [place]. This information is not intended to replace advice given to you by your health care provider. Make sure you discuss any questions you have with your health care provider.      Colin Benton R.

## 2014-11-13 NOTE — Progress Notes (Signed)
Pre visit review using our clinic review tool, if applicable. No additional management support is needed unless otherwise documented below in the visit note. 

## 2014-11-13 NOTE — Patient Instructions (Signed)

## 2014-12-02 ENCOUNTER — Telehealth: Payer: Self-pay | Admitting: Family Medicine

## 2014-12-02 NOTE — Telephone Encounter (Signed)
Per OV note 07/2014 - he was instructed to stop Brilinta due to cost and start Plavix

## 2014-12-02 NOTE — Telephone Encounter (Signed)
Spoke with patient and advised him that Dr. Radford Pax advised him in March that he could d/c Brilinta and start Plavix due to cost; I advised he should have a Rx for Plavix that was sent on 08/02/14 to [facility].  I had patient go and check his pill bottles and he states he does not have the medication. I spoke with at [facility] at [facility] and she advised that she can use Rx sent 3/11 and that she will have patient's medication ready for pick up.  I advised her that the Brilinta has been d/c'ed.  She verbalized understanding and agreement and I thanked her for her help.

## 2014-12-02 NOTE — Telephone Encounter (Signed)
Pt has been seeing dr Maudie Mercury while dr todd is out.  Pt had BRILINTA 90 MG TABS tablet transferred to [place] b/c he needed refill while on vacation.  Now they will not send it back.  Pt needs new rx sent to [place]  Pt is completely out.

## 2014-12-02 NOTE — Telephone Encounter (Signed)
I called the pt and advised him per Dr Maudie Mercury she does not prescribe this medication and he should contact his cardiologist.  He stated he sent this to the wrong office and he is aware I forwarded this to Dr Radford Pax.

## 2014-12-03 ENCOUNTER — Other Ambulatory Visit: Payer: Self-pay | Admitting: Family Medicine

## 2014-12-17 ENCOUNTER — Other Ambulatory Visit: Payer: Self-pay | Admitting: Family Medicine

## 2015-01-07 ENCOUNTER — Other Ambulatory Visit: Payer: Self-pay | Admitting: Family Medicine

## 2015-01-09 ENCOUNTER — Encounter: Payer: Self-pay | Admitting: Family Medicine

## 2015-01-09 ENCOUNTER — Ambulatory Visit (INDEPENDENT_AMBULATORY_CARE_PROVIDER_SITE_OTHER): Payer: Commercial Managed Care - HMO | Admitting: Family Medicine

## 2015-01-09 VITALS — BP 120/80 | HR 87 | Temp 98.3°F | Ht 67.0 in | Wt 210.7 lb

## 2015-01-09 DIAGNOSIS — L237 Allergic contact dermatitis due to plants, except food: Secondary | ICD-10-CM

## 2015-01-09 MED ORDER — PREDNISONE 10 MG PO TABS: ORAL_TABLET | ORAL | Status: DC

## 2015-01-09 NOTE — Progress Notes (Signed)
HPI:  Poison ivy or oak: -started a few days ago after working in the yard -vesiculopapular rash on arms and face, itchy -some of the rash on face is around th L eye -denies: lesion in the mouth, in eye, vision changes, fevers, trouble breathing  ROS: See pertinent positives and negatives per HPI.  Past Medical History  Diagnosis Date  . ALLERGIC RHINITIS 01/30/2007  . GERD 09/26/2007  . HYPERLIPIDEMIA 09/26/2007  . PLANTAR FASCIITIS, BILATERAL 07/08/2009  . Unspecified hearing loss 06/16/2009    wears aides both ears  . ED (erectile dysfunction)   . Hypertension   . Arthritis     "hands" (05/29/2014)  . Coronary artery disease     LHC (05/29/14): pLAD 50-70, pRCA 99 (L>R collats), EF 55% >> PCI: 3.5 x 28 mm Xience Alpine DES to [facility]    Past Surgical History  Procedure Laterality Date  . Knee arthroscopy Right ~ 2005  . Coronary angioplasty with stent placement  05/29/2014    "1"  . Left heart catheterization with coronary angiogram N/A 05/29/2014    Procedure: LEFT HEART CATHETERIZATION WITH CORONARY ANGIOGRAM;  Surgeon: Sinclair Grooms, MD;  Location: [facility] CATH LAB;  Service: Cardiovascular;  Laterality: N/A;  . Cardiac catheterization  05/29/2014    Procedure: CORONARY STENT INTERVENTION;  Surgeon: Sinclair Grooms, MD;  Location: [facility] CATH LAB;  Service: Cardiovascular;;  Prox RCA    Family History  Problem Relation Age of Onset  . COPD Neg Hx     family hx  . Hyperlipidemia Neg Hx     family hx  . Heart attack Mother   . Heart disease Mother   . Heart attack Father   . Heart disease Father     Social History   Social History  . Marital Status: Married    Spouse Name: N/A  . Number of Children: N/A  . Years of Education: N/A   Social History Main Topics  . Smoking status: Former Smoker    Types: Cigarettes  . Smokeless tobacco: Never Used     Comment: 05/29/2014 "smoked socially; nothing since 1980"  . Alcohol Use: 1.2 oz/week    2 Shots of liquor, 0 Standard drinks  or equivalent per week  . Drug Use: No  . Sexual Activity: Not Currently   Other Topics Concern  . None   Social History Narrative   Lives with wife in a one story home.  Has 2 children.  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  Education: 3 years of college.      Current outpatient prescriptions:  .  aspirin 81 MG chewable tablet, Chew 1 tablet (81 mg total) by mouth daily., Disp: , Rfl:  .  clopidogrel (PLAVIX) 75 MG tablet, Take 1 tablet (75 mg total) by mouth daily., Disp: 90 tablet, Rfl: 3 .  Cyanocobalamin (VITAMIN B 12 PO), Take 1 tablet by mouth daily. , Disp: , Rfl:  .  fexofenadine (ALLEGRA) 180 MG tablet, Take 180 mg by mouth daily. Takes as needed for seasonal allergies spring/fall, Disp: , Rfl:  .  losartan-hydrochlorothiazide (HYZAAR) 50-12.5 MG per tablet, TAKE 1 TABLET EVERY DAY, Disp: 90 tablet, Rfl: 0 .  metoprolol tartrate (LOPRESSOR) 25 MG tablet, TAKE 1 TABLET TWICE DAILY, Disp: 180 tablet, Rfl: 0 .  Multiple Vitamin (MULTIVITAMIN WITH MINERALS) TABS tablet, Take 1 tablet by mouth daily., Disp: , Rfl:  .  nitroGLYCERIN (NITROSTAT) 0.4 MG SL tablet, Place 1 tablet (0.4 mg total) under the tongue every 5 (  five) minutes as needed for chest pain., Disp: 25 tablet, Rfl: 12 .  omeprazole (PRILOSEC) 20 MG capsule, TAKE 1 CAPSULE TWICE DAILY, Disp: 180 capsule, Rfl: 0 .  simvastatin (ZOCOR) 40 MG tablet, TAKE 1 TABLET AT BEDTIME, Disp: 90 tablet, Rfl: 0 .  predniSONE (DELTASONE) 10 MG tablet, 5 tablets ('50mg'$ ) daily for 3 days, then 4 tablets (40 mg) daily for 3 days, then 3 tablets ('30mg'$ ) daily for 3 days, then 2 tablets (20 mg) daily for 3 days, then 1 tablet (10 mg) daily for 3 days., Disp: 45 tablet, Rfl: 0  EXAM:  Filed Vitals:   01/09/15 1046  BP: 120/80  Pulse: 87  Temp: 98.3 F (36.8 C)    Body mass index is 32.99 kg/(m^2).  GENERAL: vitals reviewed and listed above, alert, oriented, appears well hydrated and in no acute distress  HEENT: atraumatic, conjunttiva clear, PERRLA,  visual acuity grossly intact, no obvious abnormalities on inspection of external nose and ears  NECK: no obvious masses on inspection  SKIN: vesiculopapular erythematous patches on arms and face, near L eye  MS: moves all extremities without noticeable abnormality  PSYCH: pleasant and cooperative, no obvious depression or anxiety  ASSESSMENT AND PLAN:  Discussed the following assessment and plan:  Poison ivy dermatitis  -systemic steroid given facial lesions, risks discussed -prevention advised -return and ermergency precuations -Patient advised to return or notify a doctor immediately if symptoms worsen or persist or new concerns arise.  There are no Patient Instructions on file for this visit.   Colin Benton R.

## 2015-01-09 NOTE — Patient Instructions (Signed)
Get the prednisone and start this today  Please see your eye doctor if lesions in the eye or trouble with vision  Zyrtec daily at night  Follow up as needed  [place] is a inflammation of the skin (contact dermatitis) caused by touching the allergens on the leaves of the ivy plant following previous exposure to the plant. The rash usually appears 48 hours after exposure. The rash is usually bumps (papules) or blisters (vesicles) in a linear pattern. Depending on your own sensitivity, the rash may simply cause redness and itching, or it may also progress to blisters which may break open. These must be well cared for to prevent secondary bacterial (germ) infection, followed by scarring. Keep any open areas dry, clean, dressed, and covered with an antibacterial ointment if needed. The eyes may also get puffy. The puffiness is worst in the morning and gets better as the day progresses. This dermatitis usually heals without scarring, within 2 to 3 weeks without treatment. HOME CARE INSTRUCTIONS  Thoroughly wash with soap and water as soon as you have been exposed to poison ivy. You have about one half hour to remove the plant resin before it will cause the rash. This washing will destroy the oil or antigen on the skin that is causing, or will cause, the rash. Be sure to wash under your fingernails as any plant resin there will continue to spread the rash. Do not rub skin vigorously when washing affected area. Poison ivy cannot spread if no oil from the plant remains on your body. A rash that has progressed to weeping sores will not spread the rash unless you have not washed thoroughly. It is also important to wash any clothes you have been wearing as these may carry active allergens. The rash will return if you wear the unwashed clothing, even several days later. Avoidance of the plant in the future is the best measure. Poison ivy plant can be recognized by the number of leaves. Generally, poison  ivy has three leaves with flowering branches on a single stem. Diphenhydramine may be purchased over the counter and used as needed for itching. Do not drive with this medication if it makes you drowsy.Ask your caregiver about medication for children. SEEK MEDICAL CARE IF:  Open sores develop.  Redness spreads beyond area of rash.  You notice purulent (pus-like) discharge.  You have increased pain.  Other signs of infection develop (such as fever). Document Released: 05/07/2000 Document Revised: 08/02/2011 Document Reviewed: 10/18/2008 [facility] Patient Information 2015 [place], [place]. This information is not intended to replace advice given to you by your health care provider. Make sure you discuss any questions you have with your health care provider.

## 2015-01-09 NOTE — Progress Notes (Signed)
Pre visit review using our clinic review tool, if applicable. No additional management support is needed unless otherwise documented below in the visit note. 

## 2015-01-30 ENCOUNTER — Ambulatory Visit (INDEPENDENT_AMBULATORY_CARE_PROVIDER_SITE_OTHER): Payer: Commercial Managed Care - HMO | Admitting: Cardiology

## 2015-01-30 ENCOUNTER — Encounter: Payer: Self-pay | Admitting: Cardiology

## 2015-01-30 VITALS — BP 110/72 | HR 74 | Ht 67.0 in | Wt 212.8 lb

## 2015-01-30 DIAGNOSIS — I2583 Coronary atherosclerosis due to lipid rich plaque: Principal | ICD-10-CM

## 2015-01-30 DIAGNOSIS — I2510 Atherosclerotic heart disease of native coronary artery without angina pectoris: Secondary | ICD-10-CM | POA: Diagnosis not present

## 2015-01-30 DIAGNOSIS — I10 Essential (primary) hypertension: Secondary | ICD-10-CM | POA: Diagnosis not present

## 2015-01-30 DIAGNOSIS — E781 Pure hyperglyceridemia: Secondary | ICD-10-CM | POA: Diagnosis not present

## 2015-01-30 NOTE — Progress Notes (Signed)
Cardiology Office Note   Date:  01/30/2015   ID:  Carl Harrell, DOB [birth year], MRN [id number]  PCP:  Joycelyn Man, MD    Chief Complaint  Patient presents with  . CAD      History of Present Illness: Carl Harrell is a 69 y.o. male who presents for FU of CAD.  He has a history of prior TIA, HL, GERD, PAD. Titanic 05/29/14 demonstrated a subtotally occluded native RCA with 99% proximal stenosis which was treated with a DES. There was moderate stenosis noted in the LAD at 50-70%. There was normal anterior perfusion on nuclear stress test. Dr. Tamala Julian performed this procedure and noted that if he had continued anginal symptoms, consideration should be given towards repeating nuclear study to assess for anterior ischemia with an eye towards PCI of the LAD.   He is doing well since his PCI. He denies significant dyspnea. He denies any chest pain, LE edema, dizziness, palpitations or syncope. He is NYHA 2. He denies orthopnea, PND.    Past Medical History  Diagnosis Date  . ALLERGIC RHINITIS 01/30/2007  . GERD 09/26/2007  . HYPERLIPIDEMIA 09/26/2007  . PLANTAR FASCIITIS, BILATERAL 07/08/2009  . Unspecified hearing loss 06/16/2009    wears aides both ears  . ED (erectile dysfunction)   . Hypertension   . Arthritis     "hands" (05/29/2014)  . Coronary artery disease     LHC (05/29/14): pLAD 50-70, pRCA 99 (L>R collats), EF 55% >> PCI: 3.5 x 28 mm Xience Alpine DES to [facility]    Past Surgical History  Procedure Laterality Date  . Knee arthroscopy Right ~ 2005  . Coronary angioplasty with stent placement  05/29/2014    "1"  . Left heart catheterization with coronary angiogram N/A 05/29/2014    Procedure: LEFT HEART CATHETERIZATION WITH CORONARY ANGIOGRAM;  Surgeon: Sinclair Grooms, MD;  Location: [facility] CATH LAB;  Service: Cardiovascular;  Laterality: N/A;  . Cardiac catheterization  05/29/2014    Procedure: CORONARY STENT INTERVENTION;  Surgeon: Sinclair Grooms, MD;   Location: [facility] CATH LAB;  Service: Cardiovascular;;  Prox RCA     Current Outpatient Prescriptions  Medication Sig Dispense Refill  . aspirin 81 MG chewable tablet Chew 1 tablet (81 mg total) by mouth daily.    . clopidogrel (PLAVIX) 75 MG tablet Take 1 tablet (75 mg total) by mouth daily. 90 tablet 3  . Cyanocobalamin (VITAMIN B 12 PO) Take 1 tablet by mouth daily.     . fexofenadine (ALLEGRA) 180 MG tablet Take 180 mg by mouth daily as needed for allergies or rhinitis.    Marland Kitchen losartan-hydrochlorothiazide (HYZAAR) 50-12.5 MG per tablet TAKE 1 TABLET EVERY DAY 90 tablet 0  . metoprolol tartrate (LOPRESSOR) 25 MG tablet TAKE 1 TABLET TWICE DAILY 180 tablet 0  . Multiple Vitamin (MULTIVITAMIN WITH MINERALS) TABS tablet Take 1 tablet by mouth daily.    . nitroGLYCERIN (NITROSTAT) 0.4 MG SL tablet Place 1 tablet (0.4 mg total) under the tongue every 5 (five) minutes as needed for chest pain. 25 tablet 12  . omeprazole (PRILOSEC) 20 MG capsule TAKE 1 CAPSULE TWICE DAILY 180 capsule 0  . predniSONE (DELTASONE) 10 MG tablet 5 tablets ('50mg'$ ) daily for 3 days, then 4 tablets (40 mg) daily for 3 days, then 3 tablets ('30mg'$ ) daily for 3 days, then 2 tablets (20 mg) daily for 3 days,  then 1 tablet (10 mg) daily for 3 days. 45 tablet 0  . simvastatin (ZOCOR) 40 MG tablet TAKE 1 TABLET AT BEDTIME 90 tablet 0   No current facility-administered medications for this visit.    Allergies:   Review of patient's allergies indicates no known allergies.    Social History:  The patient  reports that he has quit smoking. His smoking use included Cigarettes. He has never used smokeless tobacco. He reports that he drinks about 1.2 oz of alcohol per week. He reports that he does not use illicit drugs.   Family History:  The patient's family history includes Heart attack in his father and mother; Heart disease in his father and mother. There is no history of COPD or Hyperlipidemia.    ROS:  Please see the history of  present illness.   Otherwise, review of systems are positive for none.   All other systems are reviewed and negative.    PHYSICAL EXAM: VS:  BP 110/72 mmHg  Pulse 74  Ht '5\' 7"'$  (1.702 m)  Wt 212 lb 12.8 oz (96.525 kg)  BMI 33.32 kg/m2  SpO2 98% , BMI Body mass index is 33.32 kg/(m^2). GEN: Well nourished, well developed, in no acute distress HEENT: normal Neck: no JVD, carotid bruits, or masses Cardiac: RRR; no murmurs, rubs, or gallops,no edema  Respiratory:  clear to auscultation bilaterally, normal work of breathing GI: soft, nontender, nondistended, + BS MS: no deformity or atrophy Skin: warm and dry, no rash Neuro:  Strength and sensation are intact Psych: euthymic mood, full affect   EKG:  EKG is not ordered today.    Recent Labs: 03/14/2014: TSH 1.53 05/30/2014: BUN 12; Creatinine, Ser 1.19; Hemoglobin 14.1; Platelets 241; Potassium 3.9; Sodium 139 08/09/2014: ALT 31    Lipid Panel    Component Value Date/Time   CHOL 130 08/09/2014 0842   TRIG 90.0 08/09/2014 0842   HDL 37.90* 08/09/2014 0842   CHOLHDL 3 08/09/2014 0842   VLDL 18.0 08/09/2014 0842   LDLCALC 74 08/09/2014 0842      Wt Readings from Last 3 Encounters:  01/30/15 212 lb 12.8 oz (96.525 kg)  01/09/15 210 lb 11.2 oz (95.573 kg)  11/13/14 203 lb 12.8 oz (92.443 kg)    ASSESSMENT AND PLAN:  1. Coronary Artery Disease: He is doing well after recent PCI to the RCA with a DES. He does have residual moderate disease in the LAD. However, he denies any further anginal symptoms. He was instructed to call for earlier follow-up should he have recurrent symptoms that remind him of his previous angina. He is tolerating his current medications. He is in cardiac rehab and doing well.    - Continue ASA, statin, beta blocker, Plavix    -  I have encouraged him to get into an exercise program 2. Hypertension: Controlled. He is tolerating Metoprolol Tartrate, Hyzaar. 3. Hyperlipidemia: Managed by PCP.  Continue moderate intensity statin.His last LDL was near goal at 74. I will set him up for an FLP and ALT.  4. Prior TIA: He notes increased strength on his right and improved balance since his PCI.     Current medicines are reviewed at length with the patient today.  The patient does not have concerns regarding medicines.  The following changes have been made:  no change  Labs/ tests ordered today: See above Assessment and Plan No orders of the defined types were placed in this encounter.     Disposition:   FU with  me in 6 months  Signed, Sueanne Margarita, MD  01/30/2015 10:50 AM    [facility] [place], [place], [place]  [address] Phone: [phone]; Fax: [phone]

## 2015-01-30 NOTE — Patient Instructions (Signed)
Medication Instructions:  Your physician recommends that you continue on your current medications as directed. Please refer to the Current Medication list given to you today.   Labwork: Your physician recommends that you return for FASTING lab work. (LFTs, lipids)  Testing/Procedures: None  Follow-Up: Your physician wants you to follow-up in: 6 months with Dr. Radford Pax. You will receive a reminder letter in the mail two months in advance. If you don't receive a letter, please call our office to schedule the follow-up appointment.   Any Other Special Instructions Will Be Listed Below (If Applicable).

## 2015-01-31 ENCOUNTER — Other Ambulatory Visit (INDEPENDENT_AMBULATORY_CARE_PROVIDER_SITE_OTHER): Payer: Commercial Managed Care - HMO

## 2015-01-31 DIAGNOSIS — E781 Pure hyperglyceridemia: Secondary | ICD-10-CM | POA: Diagnosis not present

## 2015-01-31 LAB — LIPID PANEL
CHOLESTEROL: 155 mg/dL (ref 0–200)
HDL: 40.9 mg/dL (ref >39.00)
LDL Cholesterol: 86 mg/dL (ref 0–99)
NonHDL: 114.51
TRIGLYCERIDES: 141 mg/dL (ref 0.0–149.0)
Total CHOL/HDL Ratio: 4
VLDL: 28.2 mg/dL (ref 0.0–40.0)

## 2015-01-31 LAB — HEPATIC FUNCTION PANEL
ALBUMIN: 4.1 g/dL (ref 3.5–5.2)
ALT: 35 U/L (ref 0–53)
AST: 19 U/L (ref 0–37)
Alkaline Phosphatase: 44 U/L (ref 39–117)
BILIRUBIN DIRECT: 0.2 mg/dL (ref 0.0–0.3)
TOTAL PROTEIN: 6.7 g/dL (ref 6.0–8.3)
Total Bilirubin: 1 mg/dL (ref 0.2–1.2)

## 2015-02-04 ENCOUNTER — Telehealth: Payer: Self-pay

## 2015-02-04 DIAGNOSIS — E781 Pure hyperglyceridemia: Secondary | ICD-10-CM

## 2015-02-04 MED ORDER — EZETIMIBE 10 MG PO TABS: 10.0000 mg | ORAL_TABLET | Freq: Every day | ORAL | Status: DC

## 2015-02-04 NOTE — Telephone Encounter (Signed)
Informed patient of results and verbal understanding expressed.  Instructed patient to START ZETIA 10 mg daily. FLP and ALT scheduled for 11/14. Patient agrees with treatment plan.

## 2015-02-04 NOTE — Telephone Encounter (Signed)
-----   Message from Sueanne Margarita, MD sent at 01/31/2015  4:05 PM EDT ----- LDL not at goal of < 70 - add Zetia '10mg'$  daily and recheck FLP and ALT in 8 weeks

## 2015-02-05 ENCOUNTER — Telehealth: Payer: Self-pay

## 2015-02-05 NOTE — Telephone Encounter (Signed)
Patient was contacted yesterday concerning adding Zetia he would like to know if this will be replacing his simvastatin?   Please advise him I did not see anything in the chart concerning replacing to simvastain.

## 2015-02-05 NOTE — Telephone Encounter (Signed)
Left message for patient to take new medication with his old medications and NOT to replace anything. Instructed patient to call back if he has further questions or concerns.

## 2015-04-01 ENCOUNTER — Other Ambulatory Visit: Payer: Self-pay | Admitting: Family Medicine

## 2015-04-07 ENCOUNTER — Other Ambulatory Visit (INDEPENDENT_AMBULATORY_CARE_PROVIDER_SITE_OTHER): Payer: Commercial Managed Care - HMO | Admitting: *Deleted

## 2015-04-07 DIAGNOSIS — E781 Pure hyperglyceridemia: Secondary | ICD-10-CM

## 2015-04-07 LAB — LIPID PANEL
CHOL/HDL RATIO: 3.4 ratio (ref <=5.0)
Cholesterol: 121 mg/dL — ABNORMAL LOW (ref 125–200)
HDL: 36 mg/dL — AB (ref >=40)
LDL CALC: 54 mg/dL (ref <130)
TRIGLYCERIDES: 153 mg/dL — AB (ref <150)
VLDL: 31 mg/dL — ABNORMAL HIGH (ref <30)

## 2015-04-07 LAB — ALT: ALT: 25 U/L (ref 9–46)

## 2015-04-15 ENCOUNTER — Other Ambulatory Visit: Payer: Self-pay | Admitting: Family Medicine

## 2015-04-21 ENCOUNTER — Telehealth: Payer: Self-pay | Admitting: Family Medicine

## 2015-04-21 MED ORDER — OMEPRAZOLE 20 MG PO CPDR: 20.0000 mg | DELAYED_RELEASE_CAPSULE | Freq: Two times a day (BID) | ORAL | Status: DC

## 2015-04-21 NOTE — Telephone Encounter (Signed)
Would like a refill on his omeprazole '20mg'$  please. He uses [facility] on [address].

## 2015-05-30 ENCOUNTER — Other Ambulatory Visit: Payer: Self-pay | Admitting: Family Medicine

## 2015-08-04 NOTE — Progress Notes (Signed)
Cardiology Office Note   Date:  08/05/2015   ID:  Carl Harrell, DOB 1945/08/31, MRN [id number]  PCP:  Joycelyn Man, MD    Chief Complaint  Patient presents with  . Coronary Artery Disease  . Hypertension  . Hyperlipidemia      History of Present Illness: Carl Harrell is a 70 y.o. male  who presents for FU of CAD. He has a history of prior TIA, HL, GERD, PAD. Ward 05/29/14 demonstrated a subtotally occluded native RCA with 99% proximal stenosis which was treated with a DES. There was moderate stenosis noted in the LAD at 50-70%. There was normal anterior perfusion on nuclear stress test. Dr. Tamala Julian performed this procedure and noted that if he had continued anginal symptoms, consideration should be given towards repeating nuclear study to assess for anterior ischemia with an eye towards PCI of the LAD.   He is doing well since his PCI. He denies significant dyspnea. He denies any chest pain, LE edema, dizziness, palpitations or syncope. He is NYHA 1. He denies orthopnea, PND.  He works out at [employer] a few times weekly.    Past Medical History  Diagnosis Date  . ALLERGIC RHINITIS 01/30/2007  . GERD 09/26/2007  . HYPERLIPIDEMIA 09/26/2007  . PLANTAR FASCIITIS, BILATERAL 07/08/2009  . Unspecified hearing loss 06/16/2009    wears aides both ears  . ED (erectile dysfunction)   . Hypertension   . Arthritis     "hands" (05/29/2014)  . Coronary artery disease     LHC (05/29/14): pLAD 50-70, pRCA 99 (L>R collats), EF 55% >> PCI: 3.5 x 28 mm Xience Alpine DES to [facility]    Past Surgical History  Procedure Laterality Date  . Knee arthroscopy Right ~ 2005  . Coronary angioplasty with stent placement  05/29/2014    "1"  . Left heart catheterization with coronary angiogram N/A 05/29/2014    Procedure: LEFT HEART CATHETERIZATION WITH CORONARY ANGIOGRAM;  Surgeon: Sinclair Grooms, MD;  Location: [facility] CATH LAB;  Service: Cardiovascular;  Laterality: N/A;  . Cardiac  catheterization  05/29/2014    Procedure: CORONARY STENT INTERVENTION;  Surgeon: Sinclair Grooms, MD;  Location: [name] CATH LAB;  Service: Cardiovascular;;  Prox RCA     Current Outpatient Prescriptions  Medication Sig Dispense Refill  . aspirin 81 MG chewable tablet Chew 1 tablet (81 mg total) by mouth daily.    . clopidogrel (PLAVIX) 75 MG tablet Take 1 tablet (75 mg total) by mouth daily. 90 tablet 3  . Cyanocobalamin (VITAMIN B 12 PO) Take 1 tablet by mouth daily.     Marland Kitchen ezetimibe (ZETIA) 10 MG tablet Take 1 tablet (10 mg total) by mouth daily. 30 tablet 11  . fexofenadine (ALLEGRA) 180 MG tablet Take 180 mg by mouth daily as needed for allergies or rhinitis.    Marland Kitchen losartan-hydrochlorothiazide (HYZAAR) 50-12.5 MG tablet TAKE 1 TABLET EVERY DAY 90 tablet 0  . metoprolol tartrate (LOPRESSOR) 25 MG tablet TAKE 1 TABLET TWICE DAILY 180 tablet 0  . Multiple Vitamin (MULTIVITAMIN WITH MINERALS) TABS tablet Take 1 tablet by mouth daily.    . nitroGLYCERIN (NITROSTAT) 0.4 MG SL tablet Place 1 tablet (0.4 mg total) under the tongue every 5 (five) minutes as needed for chest pain. 25 tablet 12  . omeprazole (PRILOSEC) 20 MG capsule Take 1 capsule (20 mg total) by mouth 2 (two) times  daily. 60 capsule 0  . simvastatin (ZOCOR) 40 MG tablet TAKE 1 TABLET AT BEDTIME 90 tablet 0   No current facility-administered medications for this visit.    Allergies:   Review of patient's allergies indicates no known allergies.    Social History:  The patient  reports that he has quit smoking. His smoking use included Cigarettes. He has never used smokeless tobacco. He reports that he drinks about 1.2 oz of alcohol per week. He reports that he does not use illicit drugs.   Family History:  The patient's family history includes Heart attack in his father and mother; Heart disease in his father and mother. There is no history of COPD or Hyperlipidemia.    ROS:  Please see the history of present illness.   Otherwise,  review of systems are positive for none.   All other systems are reviewed and negative.    PHYSICAL EXAM: VS:  BP 122/76 mmHg  Pulse 70  Ht '5\' 7"'$  (1.702 m)  Wt 212 lb (96.163 kg)  BMI 33.20 kg/m2 , BMI Body mass index is 33.2 kg/(m^2). GEN: Well nourished, well developed, in no acute distress HEENT: normal Neck: no JVD, carotid bruits, or masses Cardiac: RRR; no murmurs, rubs, or gallops,no edema  Respiratory:  clear to auscultation bilaterally, normal work of breathing GI: soft, nontender, nondistended, + BS MS: no deformity or atrophy Skin: warm and dry, no rash Neuro:  Strength and sensation are intact Psych: euthymic mood, full affect   EKG:  EKG was ordered today showing NSR with LAFB and inferior infarct    Recent Labs: 04/07/2015: ALT 25    Lipid Panel    Component Value Date/Time   CHOL 121* 04/07/2015 0848   TRIG 153* 04/07/2015 0848   HDL 36* 04/07/2015 0848   CHOLHDL 3.4 04/07/2015 0848   VLDL 31* 04/07/2015 0848   LDLCALC 54 04/07/2015 0848      Wt Readings from Last 3 Encounters:  08/05/15 212 lb (96.163 kg)  01/30/15 212 lb 12.8 oz (96.525 kg)  01/09/15 210 lb 11.2 oz (95.573 kg)    ASSESSMENT AND PLAN:  1. Coronary Artery Disease:S/P PCI to the RCA with a DES. He does have residual moderate disease in the LAD. However, he denies any further anginal symptoms. He was instructed to call for any recurrent symptoms that remind him of his previous angina. He is tolerating his current medications.     - Continue ASA, statin, beta blocker, Plavix 2. Hypertension: Controlled. He is tolerating Metoprolol Tartrate, Hyzaar. 3. Hyperlipidemia: Managed by PCP. Continue moderate intensity statin.His last LDL was at goal at 54.Repeat lipids in May   Current medicines are reviewed at length with the patient today.  The patient does not have concerns regarding medicines.  The following changes have been made:  no change  Labs/ tests ordered  today: See above Assessment and Plan No orders of the defined types were placed in this encounter.     Disposition:   FU with me in 6 months  Signed, Sueanne Margarita, MD  08/05/2015 8:13 AM    [facility] [address], [place], [place]  [address] Phone: [phone]; Fax: [phone]

## 2015-08-05 ENCOUNTER — Encounter: Payer: Self-pay | Admitting: Cardiology

## 2015-08-05 ENCOUNTER — Ambulatory Visit (INDEPENDENT_AMBULATORY_CARE_PROVIDER_SITE_OTHER): Payer: Commercial Managed Care - HMO | Admitting: Cardiology

## 2015-08-05 VITALS — BP 122/76 | HR 70 | Ht 67.0 in | Wt 212.0 lb

## 2015-08-05 DIAGNOSIS — I2510 Atherosclerotic heart disease of native coronary artery without angina pectoris: Secondary | ICD-10-CM

## 2015-08-05 DIAGNOSIS — I2583 Coronary atherosclerosis due to lipid rich plaque: Principal | ICD-10-CM

## 2015-08-05 DIAGNOSIS — E781 Pure hyperglyceridemia: Secondary | ICD-10-CM | POA: Diagnosis not present

## 2015-08-05 DIAGNOSIS — I10 Essential (primary) hypertension: Secondary | ICD-10-CM | POA: Diagnosis not present

## 2015-08-05 NOTE — Patient Instructions (Signed)
Medication Instructions:  Your physician recommends that you continue on your current medications as directed. Please refer to the Current Medication list given to you today.   Labwork: Your physician recommends that you return for FASTING lab work in MAY 2017.  Testing/Procedures: None  Follow-Up: Your physician wants you to follow-up in: 6 months with Dr. Radford Pax. You will receive a reminder letter in the mail two months in advance. If you don't receive a letter, please call our office to schedule the follow-up appointment.   Any Other Special Instructions Will Be Listed Below (If Applicable).     If you need a refill on your cardiac medications before your next appointment, please call your pharmacy.

## 2015-08-07 ENCOUNTER — Telehealth: Payer: Self-pay | Admitting: Family Medicine

## 2015-08-07 MED ORDER — LOSARTAN POTASSIUM-HCTZ 50-12.5 MG PO TABS: 1.0000 | ORAL_TABLET | Freq: Every day | ORAL | Status: DC

## 2015-08-07 NOTE — Telephone Encounter (Signed)
Rx sent 

## 2015-08-07 NOTE — Telephone Encounter (Signed)
Pt request refill  metoprolol tartrate (LOPRESSOR) 25 MG tablet  [facility]

## 2015-08-12 ENCOUNTER — Telehealth: Payer: Self-pay | Admitting: Family Medicine

## 2015-08-12 ENCOUNTER — Other Ambulatory Visit: Payer: Self-pay | Admitting: *Deleted

## 2015-08-12 MED ORDER — METOPROLOL TARTRATE 25 MG PO TABS: 25.0000 mg | ORAL_TABLET | Freq: Two times a day (BID) | ORAL | Status: DC

## 2015-08-12 MED ORDER — EZETIMIBE 10 MG PO TABS: 10.0000 mg | ORAL_TABLET | Freq: Every day | ORAL | Status: DC

## 2015-08-12 NOTE — Telephone Encounter (Signed)
Pt states [facility] never received his rx for metoprolol tartrate (LOPRESSOR) 25 MG tablet on 3/16.  can you resend?   Pt is now out and needs 15 day supply sent to  [facility]/ [address]

## 2015-08-12 NOTE — Telephone Encounter (Signed)
Refill sent.

## 2015-09-05 ENCOUNTER — Other Ambulatory Visit: Payer: Self-pay | Admitting: Cardiology

## 2015-09-15 ENCOUNTER — Ambulatory Visit (INDEPENDENT_AMBULATORY_CARE_PROVIDER_SITE_OTHER): Payer: Commercial Managed Care - HMO | Admitting: Family Medicine

## 2015-09-15 ENCOUNTER — Encounter: Payer: Self-pay | Admitting: Family Medicine

## 2015-09-15 VITALS — BP 112/74 | HR 86 | Temp 98.2°F | Ht 67.0 in | Wt 204.7 lb

## 2015-09-15 DIAGNOSIS — M545 Low back pain: Secondary | ICD-10-CM | POA: Diagnosis not present

## 2015-09-15 MED ORDER — CYCLOBENZAPRINE HCL 10 MG PO TABS: 10.0000 mg | ORAL_TABLET | Freq: Every day | ORAL | Status: DC

## 2015-09-15 NOTE — Progress Notes (Signed)
HPI:  Carl Harrell  Is a pleasant 70 year old here for an acute visit for back pain. He reports he has a history of sciatic back pain with several flares of this in the past. He reports after doing a lot of lifting and yard work  and sleeping in a different bed last week, he developed left sided low back pain that started about 3 days ago.  The pain is dull, non-radiating , moderate , severe at times with certain movements.  He denies weakness or numbness in the lower extremities, bowel or bladder dysfunction , fevers or malaise.  He reports he had evaluation for this pain in the past  For prior episodes in the emergency room with supportive care advised.  No recent episodes prior to this.  ROS: See pertinent positives and negatives per HPI.  Past Medical History  Diagnosis Date  . ALLERGIC RHINITIS 01/30/2007  . GERD 09/26/2007  . HYPERLIPIDEMIA 09/26/2007  . PLANTAR FASCIITIS, BILATERAL 07/08/2009  . Unspecified hearing loss 06/16/2009    wears aides both ears  . ED (erectile dysfunction)   . Hypertension   . Arthritis     "hands" (05/29/2014)  . Coronary artery disease     LHC (05/29/14): pLAD 50-70, pRCA 99 (L>R collats), EF 55% >> PCI: 3.5 x 28 mm Xience Alpine DES to [facility]    Past Surgical History  Procedure Laterality Date  . Knee arthroscopy Right ~ 2005  . Coronary angioplasty with stent placement  05/29/2014    "1"  . Left heart catheterization with coronary angiogram N/A 05/29/2014    Procedure: LEFT HEART CATHETERIZATION WITH CORONARY ANGIOGRAM;  Surgeon: Sinclair Grooms, MD;  Location: [facility] CATH LAB;  Service: Cardiovascular;  Laterality: N/A;  . Cardiac catheterization  05/29/2014    Procedure: CORONARY STENT INTERVENTION;  Surgeon: Sinclair Grooms, MD;  Location: [facility] CATH LAB;  Service: Cardiovascular;;  Prox RCA    Family History  Problem Relation Age of Onset  . COPD Neg Hx     family hx  . Hyperlipidemia Neg Hx     family hx  . Heart attack Mother   . Heart disease  Mother   . Heart attack Father   . Heart disease Father     Social History   Social History  . Marital Status: Married    Spouse Name: N/A  . Number of Children: N/A  . Years of Education: N/A   Social History Main Topics  . Smoking status: Former Smoker    Types: Cigarettes  . Smokeless tobacco: Never Used     Comment: 05/29/2014 "smoked socially; nothing since 1980"  . Alcohol Use: 1.2 oz/week    2 Shots of liquor, 0 Standard drinks or equivalent per week  . Drug Use: No  . Sexual Activity: Not Currently   Other Topics Concern  . None   Social History Narrative   Lives with wife in a one story home.  Has 2 children.  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  Education: 3 years of college.      Current outpatient prescriptions:  .  aspirin 81 MG chewable tablet, Chew 1 tablet (81 mg total) by mouth daily., Disp: , Rfl:  .  clopidogrel (PLAVIX) 75 MG tablet, take 1 tablet by mouth once daily, Disp: 90 tablet, Rfl: 3 .  Cyanocobalamin (VITAMIN B 12 PO), Take 1 tablet by mouth daily. , Disp: , Rfl:  .  ezetimibe (ZETIA) 10 MG tablet, Take 1 tablet (10 mg total)  by mouth daily., Disp: 90 tablet, Rfl: 3 .  fexofenadine (ALLEGRA) 180 MG tablet, Take 180 mg by mouth daily as needed for allergies or rhinitis., Disp: , Rfl:  .  losartan-hydrochlorothiazide (HYZAAR) 50-12.5 MG tablet, Take 1 tablet by mouth daily., Disp: 90 tablet, Rfl: 0 .  metoprolol tartrate (LOPRESSOR) 25 MG tablet, Take 1 tablet (25 mg total) by mouth 2 (two) times daily., Disp: 180 tablet, Rfl: 0 .  metoprolol tartrate (LOPRESSOR) 25 MG tablet, Take 1 tablet (25 mg total) by mouth 2 (two) times daily., Disp: 60 tablet, Rfl: 0 .  Multiple Vitamin (MULTIVITAMIN WITH MINERALS) TABS tablet, Take 1 tablet by mouth daily., Disp: , Rfl:  .  nitroGLYCERIN (NITROSTAT) 0.4 MG SL tablet, Place 1 tablet (0.4 mg total) under the tongue every 5 (five) minutes as needed for chest pain., Disp: 25 tablet, Rfl: 12 .  omeprazole (PRILOSEC) 20 MG  capsule, Take 1 capsule (20 mg total) by mouth 2 (two) times daily., Disp: 60 capsule, Rfl: 0 .  simvastatin (ZOCOR) 40 MG tablet, TAKE 1 TABLET AT BEDTIME, Disp: 90 tablet, Rfl: 0 .  cyclobenzaprine (FLEXERIL) 10 MG tablet, Take 1 tablet (10 mg total) by mouth at bedtime., Disp: 30 tablet, Rfl: 0  EXAM:  Filed Vitals:   09/15/15 1035  BP: 112/74  Pulse: 86  Temp: 98.2 F (36.8 C)    Body mass index is 32.05 kg/(m^2).  GENERAL: vitals reviewed and listed above, alert, oriented, appears well hydrated and in no acute distress  HEENT: atraumatic, conjunttiva clear, no obvious abnormalities on inspection of external nose and ears  NECK: no obvious masses on inspection  MS: moves all extremities without noticeable abnormality Normal Gait Normal inspection of back, no obvious scoliosis or leg length descrepancy No bony TTP Soft tissue TTP at: left lower paraspinal muscles and left PSIS -/+ tests: neg trendelenburg,+facet loading, -SLRT, -CLRT, -FABER, -FADIR Normal muscle strength, sensation to light touch and DTRs in LEs bilaterally  PSYCH: pleasant and cooperative, no obvious depression or anxiety  ASSESSMENT AND PLAN:  Discussed the following assessment and plan:  Low back pain without sciatica, unspecified back pain laterality  -we discussed possible serious and likely etiologies, workup and treatment, treatment risks and return precautions - suspect muscular strain, facet arthropathy, sacroiliitis most likely per exam findings. No radicular symptoms or findings today. No alarm features. -after this discussion, Carl Harrell opted for conservative care with muscle relaxer, acetaminophen, heat, topical sports creams and hep. -follow up advised in 3-4 weeks -of course, we advised Carl Harrell  to return or notify a doctor immediately if symptoms worsen or persist or new concerns arise.   -Patient advised to return or notify a doctor immediately if symptoms worsen or persist or new concerns  arise.  Patient Instructions   Before you leave: - low back exercises - schedule follow-up with Dr. Sherren Mocha in about 3-4 weeks   Tylenol ( acetaminophen)  [phone] mg up to 3 times daily as needed for pain.     Heat 15 minutes at least 2 times daily.   Topical sports creams with capsacin and menthol can also help with pain.   Flexeril , the muscle relaxer, at night. You also can use this during the day  If needed, not more then every 8 hours.   please try to stay mobile , but avoid heavy lifting and twisting of the back. Please do the exercises at least 3-4 days per week.    Seek care immediately if symptoms worsen  or new symptoms develop.       Colin Benton R.

## 2015-09-15 NOTE — Patient Instructions (Signed)
Before you leave: - low back exercises - schedule follow-up with Dr. Sherren Mocha in about 3-4 weeks   Tylenol ( acetaminophen)  [phone] mg up to 3 times daily as needed for pain.     Heat 15 minutes at least 2 times daily.   Topical sports creams with capsacin and menthol can also help with pain.   Flexeril , the muscle relaxer, at night. You also can use this during the day  If needed, not more then every 8 hours.   please try to stay mobile , but avoid heavy lifting and twisting of the back. Please do the exercises at least 3-4 days per week.    Seek care immediately if symptoms worsen or new symptoms develop.

## 2015-09-15 NOTE — Progress Notes (Signed)
Pre visit review using our clinic review tool, if applicable. No additional management support is needed unless otherwise documented below in the visit note. 

## 2015-09-22 ENCOUNTER — Other Ambulatory Visit (INDEPENDENT_AMBULATORY_CARE_PROVIDER_SITE_OTHER): Payer: Commercial Managed Care - HMO

## 2015-09-22 DIAGNOSIS — I2583 Coronary atherosclerosis due to lipid rich plaque: Principal | ICD-10-CM

## 2015-09-22 DIAGNOSIS — E781 Pure hyperglyceridemia: Secondary | ICD-10-CM | POA: Diagnosis not present

## 2015-09-22 DIAGNOSIS — I2510 Atherosclerotic heart disease of native coronary artery without angina pectoris: Secondary | ICD-10-CM

## 2015-09-22 LAB — HEPATIC FUNCTION PANEL
ALT: 26 U/L (ref 9–46)
AST: 18 U/L (ref 10–35)
Albumin: 4.1 g/dL (ref 3.6–5.1)
Alkaline Phosphatase: 50 U/L (ref 40–115)
BILIRUBIN INDIRECT: 0.7 mg/dL (ref 0.2–1.2)
BILIRUBIN TOTAL: 0.9 mg/dL (ref 0.2–1.2)
Bilirubin, Direct: 0.2 mg/dL (ref <=0.2)
TOTAL PROTEIN: 6.4 g/dL (ref 6.1–8.1)

## 2015-09-22 LAB — LIPID PANEL
Cholesterol: 118 mg/dL — ABNORMAL LOW (ref 125–200)
HDL: 36 mg/dL — AB (ref >=40)
LDL CALC: 61 mg/dL (ref <130)
TRIGLYCERIDES: 104 mg/dL (ref <150)
Total CHOL/HDL Ratio: 3.3 Ratio (ref <=5.0)
VLDL: 21 mg/dL (ref <30)

## 2015-09-23 ENCOUNTER — Telehealth: Payer: Self-pay

## 2015-09-23 NOTE — Telephone Encounter (Signed)
Received walk-in form stating: "Pt was looking at med list and noticed Plavix and cyanocobalamin was on there but he's not taking it. Wanted to be sure he wasn't supposed to be or if he needed to?"  Called patient to clarify. Despite confirming Plavix usage at 3/14 OV, he does not know how long he has been off the drug. He said it's been a while. Patient had stent placed 05/29/14. Confirmed with patient he has been taking all other medications as directed and instructed him to check with PCP for B12.  He understands he will be called with Dr. Theodosia Blender recommendations.

## 2015-09-24 NOTE — Telephone Encounter (Signed)
Given his other signficant CAD would recommend staying on Plavix

## 2015-09-24 NOTE — Telephone Encounter (Signed)
Called patient to instruct him to restart Plavix. He stated he was mistaken and has been taking the medication - he double checked his pill box and has ample supply. Patient was grateful for follow-up.

## 2015-10-14 ENCOUNTER — Other Ambulatory Visit: Payer: Self-pay | Admitting: Family Medicine

## 2016-01-02 ENCOUNTER — Other Ambulatory Visit: Payer: Self-pay | Admitting: *Deleted

## 2016-01-02 MED ORDER — SIMVASTATIN 40 MG PO TABS: 40.0000 mg | ORAL_TABLET | Freq: Every day | ORAL | 0 refills | Status: DC

## 2016-01-02 NOTE — Telephone Encounter (Signed)
Rx done. 

## 2016-01-15 NOTE — Progress Notes (Signed)
HPI:  Carl Harrell is here to establish care. He was seeing Dr. Sherren Mocha.  Has the following chronic problems that require follow up and concerns today:  CAD/HTN/HLD/Hx TIA, PAD: -LHC 05/2014 with DES to RCA and mod stenosis LAD -cardiologist is Dr. Radford Pax -meds: asa, plavix, ezetimibe, simvastatin, losartan-hctz 50-12.5, lopressor 25 -reports: has appt next week -denies: no CP, DOE, CP with exercise  Intermittent Low back pain with sciatica: -meds: flexeril prn -no symptoms reported today  Seasonal allergies: -meds: allegra  GERD: -meds omeprazole '20mg'$  -reports well controlled  B12 def Vit b12 daily  R foot pain: -R mainly -thinks OA -never takes any medication for this -better after gets moving  Mildly elevated PSA: -reports chronic mild nocturia -no new or worsening symptoms  ROS negative for unless reported above: fevers, unintentional weight loss, hearing or vision loss, chest pain, palpitations, struggling to breath, hemoptysis, melena, hematochezia, hematuria, falls, loc, si, thoughts of self harm  Past Medical History:  Diagnosis Date  . ALLERGIC RHINITIS 01/30/2007  . Arthritis    "hands" (05/29/2014)  . Coronary artery disease    LHC (05/29/14): pLAD 50-70, pRCA 99 (L>R collats), EF 55% >> PCI: 3.5 x 28 mm Xience Alpine DES to [employer]  . Cough secondary to angiotensin converting enzyme inhibitor (ACE-I) 05/28/2013  . ED (erectile dysfunction)   . GERD 09/26/2007  . HYPERLIPIDEMIA 09/26/2007  . Hypertension   . PLANTAR FASCIITIS, BILATERAL 07/08/2009  . Unspecified hearing loss 06/16/2009   wears aides both ears    Past Surgical History:  Procedure Laterality Date  . CARDIAC CATHETERIZATION  05/29/2014   Procedure: CORONARY STENT INTERVENTION;  Surgeon: Sinclair Grooms, MD;  Location: [facility] CATH LAB;  Service: Cardiovascular;;  Prox RCA  . CORONARY ANGIOPLASTY WITH STENT PLACEMENT  05/29/2014   "1"  . KNEE ARTHROSCOPY Right ~ 2005  . LEFT HEART CATHETERIZATION  WITH CORONARY ANGIOGRAM N/A 05/29/2014   Procedure: LEFT HEART CATHETERIZATION WITH CORONARY ANGIOGRAM;  Surgeon: Sinclair Grooms, MD;  Location: [facility] CATH LAB;  Service: Cardiovascular;  Laterality: N/A;    Family History  Problem Relation Age of Onset  . COPD Neg Hx     family hx  . Hyperlipidemia Neg Hx     family hx  . Heart attack Mother   . Heart disease Mother   . Heart attack Father   . Heart disease Father     Social History   Social History  . Marital status: Married    Spouse name: N/A  . Number of children: N/A  . Years of education: N/A   Social History Main Topics  . Smoking status: Former Smoker    Types: Cigarettes  . Smokeless tobacco: Never Used     Comment: 05/29/2014 "smoked socially; nothing since 1980"  . Alcohol use 1.2 oz/week    2 Shots of liquor per week  . Drug use: No  . Sexual activity: Not Currently   Other Topics Concern  . None   Social History Narrative   Lives with wife in a one story home.  Has 2 children.  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  Education: 3 years of college.    Does wood working.      Current Outpatient Prescriptions:  .  aspirin 81 MG chewable tablet, Chew 1 tablet (81 mg total) by mouth daily., Disp: , Rfl:  .  clopidogrel (PLAVIX) 75 MG tablet, take 1 tablet by mouth once daily, Disp: 90 tablet, Rfl: 3 .  Cyanocobalamin (  VITAMIN B 12 PO), Take 1 tablet by mouth daily. , Disp: , Rfl:  .  cyclobenzaprine (FLEXERIL) 10 MG tablet, Take 1 tablet (10 mg total) by mouth at bedtime., Disp: 30 tablet, Rfl: 0 .  ezetimibe (ZETIA) 10 MG tablet, Take 1 tablet (10 mg total) by mouth daily., Disp: 90 tablet, Rfl: 3 .  fexofenadine (ALLEGRA) 180 MG tablet, Take 180 mg by mouth daily as needed for allergies or rhinitis., Disp: , Rfl:  .  losartan-hydrochlorothiazide (HYZAAR) 50-12.5 MG tablet, Take 1 tablet by mouth daily., Disp: 90 tablet, Rfl: 0 .  metoprolol tartrate (LOPRESSOR) 25 MG tablet, Take 1 tablet (25 mg total) by mouth 2 (two) times  daily., Disp: 60 tablet, Rfl: 0 .  Multiple Vitamin (MULTIVITAMIN WITH MINERALS) TABS tablet, Take 1 tablet by mouth daily., Disp: , Rfl:  .  nitroGLYCERIN (NITROSTAT) 0.4 MG SL tablet, Place 1 tablet (0.4 mg total) under the tongue every 5 (five) minutes as needed for chest pain., Disp: 25 tablet, Rfl: 12 .  omeprazole (PRILOSEC) 20 MG capsule, Take 1 capsule (20 mg total) by mouth 2 (two) times daily., Disp: 60 capsule, Rfl: 0 .  simvastatin (ZOCOR) 40 MG tablet, Take 1 tablet (40 mg total) by mouth at bedtime., Disp: 90 tablet, Rfl: 0  EXAM:  Vitals:   01/16/16 0912  BP: 120/80  Pulse: 70  Temp: 98.2 F (36.8 C)    Body mass index is 32.69 kg/m.  GENERAL: vitals reviewed and listed above, alert, oriented, appears well hydrated and in no acute distress  HEENT: atraumatic, conjunttiva clear, no obvious abnormalities on inspection of external nose and ears  NECK: no obvious masses on inspection  LUNGS: clear to auscultation bilaterally, no wheezes, rales or rhonchi, good air movement  CV: HRRR, no peripheral edema  MS: moves all extremities without noticeable abnormality, normal inspection both ankles, on R foot has large deformity/bunion at 1st MTP, erythema dorsal foot in area of concern from shoe run, neg ant/post drawer and talar tilt, exam benign o/w of foot and ankle with normal cap refill and pedal pulses  PSYCH: pleasant and cooperative, no obvious depression or anxiety  ASSESSMENT AND PLAN:  Discussed the following assessment and plan:  Right foot pain -? Shoe rub and discussed proper foot wear and advised to call if persist for podiatry eval  Elevated PSA - Plan: PSA - with nocturia, if elevated will have him see urologist for eval  Essential hypertension, benign - Plan: Basic metabolic panel, CBC (no diff) Hyperlipidemia, group B Coronary artery disease due to lipid rich plaque -sees cardiologist, lipids check with cardiology -cont current  meds  Gastroesophageal reflux disease without esophagitis -We reviewed the PMH, PSH, FH, SH, Meds and Allergies. -advised healthy diet, small portions and regular exercise - advised medicare exam with susan, consider cologuard for colon ca screening -We have advised patient to follow up per instructions below.  Advised to schedule Preventive visit with [name]  - he wants to get flu shot then or elsewhere  -Patient advised to return or notify a doctor immediately if symptoms worsen or persist or new concerns arise.  Patient Instructions  BEFORE YOU LEAVE: -follow up: next 3 months with susan for your Wellness Visit -labs -follow up with Dr. Maudie Mercury in 4-6 months  Try shoes that do not rub on the top of the foot.  We recommend the following healthy lifestyle: 1) Small portions - eat off of salad plate instead of dinner plate 2) Eat a  healthy clean diet with avoidance of (less then 1 serving per week) processed foods, sweetened drinks, white starches, red meat, fast foods and sweets and consisting of: * 5-9 servings per day of fresh or frozen fruits and vegetables (not corn or potatoes, not dried or canned) *nuts and seeds, beans *olives and olive oil *small portions of lean meats such as fish and white chicken  *small portions of whole grains 3)Get at least 150 minutes of sweaty aerobic exercise per week 4)reduce stress - counseling, meditation, relaxation to balance other aspects of your life  We have ordered labs or studies at this visit. It can take up to 1-2 weeks for results and processing. IF results require follow up or explanation, we will call you with instructions. Clinically stable results will be released to your [facility]. If you have not heard from [place] or cannot find your results in [facility] in 2 weeks please contact our office at [phone].  If you are not yet signed up for [facility], please consider signing up.            Colin Benton R.

## 2016-01-16 ENCOUNTER — Ambulatory Visit (INDEPENDENT_AMBULATORY_CARE_PROVIDER_SITE_OTHER): Payer: Commercial Managed Care - HMO | Admitting: Family Medicine

## 2016-01-16 ENCOUNTER — Other Ambulatory Visit: Payer: Self-pay | Admitting: Family Medicine

## 2016-01-16 ENCOUNTER — Encounter: Payer: Self-pay | Admitting: Family Medicine

## 2016-01-16 VITALS — BP 120/80 | HR 70 | Temp 98.2°F | Ht 67.0 in | Wt 208.7 lb

## 2016-01-16 DIAGNOSIS — I2583 Coronary atherosclerosis due to lipid rich plaque: Secondary | ICD-10-CM

## 2016-01-16 DIAGNOSIS — M79671 Pain in right foot: Secondary | ICD-10-CM | POA: Diagnosis not present

## 2016-01-16 DIAGNOSIS — I10 Essential (primary) hypertension: Secondary | ICD-10-CM

## 2016-01-16 DIAGNOSIS — Z23 Encounter for immunization: Secondary | ICD-10-CM | POA: Diagnosis not present

## 2016-01-16 DIAGNOSIS — R972 Elevated prostate specific antigen [PSA]: Secondary | ICD-10-CM

## 2016-01-16 DIAGNOSIS — I2510 Atherosclerotic heart disease of native coronary artery without angina pectoris: Secondary | ICD-10-CM

## 2016-01-16 DIAGNOSIS — E781 Pure hyperglyceridemia: Secondary | ICD-10-CM | POA: Diagnosis not present

## 2016-01-16 DIAGNOSIS — K219 Gastro-esophageal reflux disease without esophagitis: Secondary | ICD-10-CM

## 2016-01-16 LAB — BASIC METABOLIC PANEL
BUN: 13 mg/dL (ref 6–23)
CHLORIDE: 103 meq/L (ref 96–112)
CO2: 31 meq/L (ref 19–32)
CREATININE: 1.21 mg/dL (ref 0.40–1.50)
Calcium: 9.1 mg/dL (ref 8.4–10.5)
GFR: 62.56 mL/min (ref >60.00)
Glucose, Bld: 107 mg/dL — ABNORMAL HIGH (ref 70–99)
POTASSIUM: 4.3 meq/L (ref 3.5–5.1)
Sodium: 140 mEq/L (ref 135–145)

## 2016-01-16 LAB — CBC
HCT: 46 % (ref 39.0–52.0)
Hemoglobin: 15.4 g/dL (ref 13.0–17.0)
MCHC: 33.4 g/dL (ref 30.0–36.0)
MCV: 83.7 fl (ref 78.0–100.0)
PLATELETS: 279 10*3/uL (ref 150.0–400.0)
RBC: 5.5 Mil/uL (ref 4.22–5.81)
RDW: 13.8 % (ref 11.5–15.5)
WBC: 9.9 10*3/uL (ref 4.0–10.5)

## 2016-01-16 LAB — PSA: PSA: 5.4 ng/mL — AB (ref 0.10–4.00)

## 2016-01-16 NOTE — Patient Instructions (Addendum)
BEFORE YOU LEAVE: -follow up: next 3 months with susan for your Wellness Visit -labs -follow up with Dr. Maudie Mercury in 4-6 months  Try shoes that do not rub on the top of the foot.  We recommend the following healthy lifestyle: 1) Small portions - eat off of salad plate instead of dinner plate 2) Eat a healthy clean diet with avoidance of (less then 1 serving per week) processed foods, sweetened drinks, white starches, red meat, fast foods and sweets and consisting of: * 5-9 servings per day of fresh or frozen fruits and vegetables (not corn or potatoes, not dried or canned) *nuts and seeds, beans *olives and olive oil *small portions of lean meats such as fish and white chicken  *small portions of whole grains 3)Get at least 150 minutes of sweaty aerobic exercise per week 4)reduce stress - counseling, meditation, relaxation to balance other aspects of your life  We have ordered labs or studies at this visit. It can take up to 1-2 weeks for results and processing. IF results require follow up or explanation, we will call you with instructions. Clinically stable results will be released to your [facility]. If you have not heard from [place] or cannot find your results in [facility] in 2 weeks please contact our office at [phone].  If you are not yet signed up for [facility], please consider signing up.

## 2016-01-16 NOTE — Progress Notes (Signed)
Pre visit review using our clinic review tool, if applicable. No additional management support is needed unless otherwise documented below in the visit note. 

## 2016-01-16 NOTE — Telephone Encounter (Signed)
Pt has been seen by Dr. Maudie Mercury since 2015. Pt seen today in office for hypertension.

## 2016-01-16 NOTE — Telephone Encounter (Signed)
Okay to fill? 

## 2016-01-21 DIAGNOSIS — H524 Presbyopia: Secondary | ICD-10-CM | POA: Diagnosis not present

## 2016-01-21 DIAGNOSIS — Z0100 Encounter for examination of eyes and vision without abnormal findings: Secondary | ICD-10-CM | POA: Diagnosis not present

## 2016-01-21 DIAGNOSIS — H5210 Myopia, unspecified eye: Secondary | ICD-10-CM | POA: Diagnosis not present

## 2016-01-27 ENCOUNTER — Encounter: Payer: Self-pay | Admitting: Cardiology

## 2016-02-10 NOTE — Progress Notes (Signed)
Cardiology Office Note    Date:  02/11/2016   ID:  Carl Harrell, DOB [birth year], MRN [id number]  PCP:  Joycelyn Man, MD  Cardiologist:  Fransico Him, MD   Chief Complaint  Patient presents with  . Coronary Artery Disease  . Hypertension  . Hyperlipidemia    History of Present Illness:  Carl Harrell is a 70 y.o. male with a history of prior TIA, HL, GERD, PAD, ASCAD with LHC 05/29/14 demonstrating a subtotally occluded native RCA with 99% proximal stenosis which was treated with a DES. There was moderate stenosis noted in the LAD at 50-70%. There was normal anterior perfusion on nuclear stress test. Dr. Tamala Julian performed this procedure and noted that if he had continued anginal symptoms, consideration should be given towards repeating nuclear study to assess for anterior ischemia with an eye towards PCI of the LAD.   He is doing well since his PCI of RCA. He denies significant dyspnea. He denies any chest pain, LE edema, dizziness, palpitations or syncope.  He denies any claudication symptoms.  He denies orthopnea, PND, SOB or DOE.  He works out at [employer] a few times weekly.    Past Medical History:  Diagnosis Date  . ALLERGIC RHINITIS 01/30/2007  . Arthritis    "hands" (05/29/2014)  . Coronary artery disease    LHC (05/29/14): pLAD 50-70, pRCA 99 (L>R collats), EF 55% >> PCI: 3.5 x 28 mm Xience Alpine DES to [employer]  . Cough secondary to angiotensin converting enzyme inhibitor (ACE-I) 05/28/2013  . ED (erectile dysfunction)   . GERD 09/26/2007  . HYPERLIPIDEMIA 09/26/2007  . Hypertension   . PLANTAR FASCIITIS, BILATERAL 07/08/2009  . Unspecified hearing loss 06/16/2009   wears aides both ears    Past Surgical History:  Procedure Laterality Date  . CARDIAC CATHETERIZATION  05/29/2014   Procedure: CORONARY STENT INTERVENTION;  Surgeon: Sinclair Grooms, MD;  Location: [facility] CATH LAB;  Service: Cardiovascular;;  Prox RCA  . CORONARY ANGIOPLASTY WITH STENT PLACEMENT  05/29/2014   "1"  . KNEE ARTHROSCOPY Right ~ 2005  . LEFT HEART CATHETERIZATION WITH CORONARY ANGIOGRAM N/A 05/29/2014   Procedure: LEFT HEART CATHETERIZATION WITH CORONARY ANGIOGRAM;  Surgeon: Sinclair Grooms, MD;  Location: [facility] CATH LAB;  Service: Cardiovascular;  Laterality: N/A;    Current Medications: Outpatient Medications Prior to Visit  Medication Sig Dispense Refill  . aspirin 81 MG chewable tablet Chew 1 tablet (81 mg total) by mouth daily.    . clopidogrel (PLAVIX) 75 MG tablet take 1 tablet by mouth once daily 90 tablet 3  . Cyanocobalamin (VITAMIN B 12 PO) Take 1 tablet by mouth daily.     . cyclobenzaprine (FLEXERIL) 10 MG tablet Take 1 tablet (10 mg total) by mouth at bedtime. 30 tablet 0  . ezetimibe (ZETIA) 10 MG tablet Take 1 tablet (10 mg total) by mouth daily. 90 tablet 3  . fexofenadine (ALLEGRA) 180 MG tablet Take 180 mg by mouth daily as needed for allergies or rhinitis.    Marland Kitchen losartan-hydrochlorothiazide (HYZAAR) 50-12.5 MG tablet TAKE 1 TABLET EVERY DAY 90 tablet 0  . metoprolol tartrate (LOPRESSOR) 25 MG tablet Take 1 tablet (25 mg total) by mouth 2 (two) times daily. 60 tablet 0  . Multiple Vitamin (MULTIVITAMIN WITH MINERALS) TABS tablet Take 1 tablet by mouth daily.    . nitroGLYCERIN (NITROSTAT) 0.4 MG SL tablet Place 1 tablet (0.4 mg total) under the tongue every 5 (five) minutes as needed  for chest pain. 25 tablet 12  . omeprazole (PRILOSEC) 20 MG capsule Take 1 capsule (20 mg total) by mouth 2 (two) times daily. 60 capsule 0  . simvastatin (ZOCOR) 40 MG tablet Take 1 tablet (40 mg total) by mouth at bedtime. 90 tablet 0   No facility-administered medications prior to visit.      Allergies:   Lisinopril   Social History   Social History  . Marital status: Married    Spouse name: N/A  . Number of children: N/A  . Years of education: N/A   Social History Main Topics  . Smoking status: Former Smoker    Types: Cigarettes  . Smokeless tobacco: Never Used      Comment: 05/29/2014 "smoked socially; nothing since 1980"  . Alcohol use 1.2 oz/week    2 Shots of liquor per week  . Drug use: No  . Sexual activity: Not Currently   Other Topics Concern  . None   Social History Narrative   Lives with wife in a one story home.  Has 2 children.  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  Education: 3 years of college.    Does wood working.      Family History:  The patient's family history includes Heart attack in his father and mother; Heart disease in his father and mother.   ROS:   Please see the history of present illness.    ROS All other systems reviewed and are negative.  No flowsheet data found.     PHYSICAL EXAM:   VS:  BP 122/68   Pulse (!) 58   Ht '5\' 7"'$  (1.702 m)   Wt 209 lb 12.8 oz (95.2 kg)   SpO2 95%   BMI 32.86 kg/m    GEN: Well nourished, well developed, in no acute distress  HEENT: normal  Neck: no JVD, carotid bruits, or masses Cardiac: RRR; no murmurs, rubs, or gallops,no edema.  Intact distal pulses bilaterally.  Respiratory:  clear to auscultation bilaterally, normal work of breathing GI: soft, nontender, nondistended, + BS MS: no deformity or atrophy  Skin: warm and dry, no rash Neuro:  Alert and Oriented x 3, Strength and sensation are intact Psych: euthymic mood, full affect  Wt Readings from Last 3 Encounters:  02/11/16 209 lb 12.8 oz (95.2 kg)  01/16/16 208 lb 11.2 oz (94.7 kg)  09/15/15 204 lb 11.2 oz (92.9 kg)      Studies/Labs Reviewed:   EKG:  EKG is not ordered today.    Recent Labs: 09/22/2015: ALT 26 01/16/2016: BUN 13; Creatinine, Ser 1.21; Hemoglobin 15.4; Platelets 279.0; Potassium 4.3; Sodium 140   Lipid Panel    Component Value Date/Time   CHOL 118 (L) 09/22/2015 0835   TRIG 104 09/22/2015 0835   HDL 36 (L) 09/22/2015 0835   CHOLHDL 3.3 09/22/2015 0835   VLDL 21 09/22/2015 0835   LDLCALC 61 09/22/2015 0835    Additional studies/ records that were reviewed today include:  none    ASSESSMENT:     1. Coronary artery disease due to lipid rich plaque   2. Essential hypertension, benign   3. Hyperlipidemia LDL goal <70      PLAN:  In order of problems listed above:  1. ASCAD with remote PCI of the RCA with residual borderline obstructive disease of the LAD.  He has no angina.  Continue on ASA/Plavix/BB/statin. I have encouraged him to continue with his exercise program but needs to cut back on portions and eat more fruits and  vegeatables.  2. HTN - BP controlled on current meds.  Continue ARB/BB/diuertic. 3. Hyperlipidemia with LDL goal < 70.  Continue statin.  LDL at goal at 61.    Medication Adjustments/Labs and Tests Ordered: Current medicines are reviewed at length with the patient today.  Concerns regarding medicines are outlined above.  Medication changes, Labs and Tests ordered today are listed in the Patient Instructions below.  There are no Patient Instructions on file for this visit.   Signed, Fransico Him, MD  02/11/2016 8:53 AM    [address], [place], [place]  [address] Phone: [phone]; Fax: [phone]

## 2016-02-11 ENCOUNTER — Encounter: Payer: Self-pay | Admitting: Cardiology

## 2016-02-11 ENCOUNTER — Ambulatory Visit (INDEPENDENT_AMBULATORY_CARE_PROVIDER_SITE_OTHER): Payer: Commercial Managed Care - HMO | Admitting: Cardiology

## 2016-02-11 VITALS — BP 122/68 | HR 58 | Ht 67.0 in | Wt 209.8 lb

## 2016-02-11 DIAGNOSIS — E785 Hyperlipidemia, unspecified: Secondary | ICD-10-CM | POA: Diagnosis not present

## 2016-02-11 DIAGNOSIS — I2510 Atherosclerotic heart disease of native coronary artery without angina pectoris: Secondary | ICD-10-CM | POA: Diagnosis not present

## 2016-02-11 DIAGNOSIS — I2583 Coronary atherosclerosis due to lipid rich plaque: Principal | ICD-10-CM

## 2016-02-11 DIAGNOSIS — I10 Essential (primary) hypertension: Secondary | ICD-10-CM

## 2016-02-11 NOTE — Patient Instructions (Signed)
Your physician recommends that you continue on your current medications as directed. Please refer to the Current Medication list given to you today.   Your physician wants you to follow-up in: [place] will receive a reminder letter in the mail two months in advance. If you don't receive a letter, please call our office to schedule the follow-up appointment.   Your physician recommends that you return for lab work in:  [place]

## 2016-03-23 ENCOUNTER — Other Ambulatory Visit: Payer: Self-pay | Admitting: Family Medicine

## 2016-04-22 ENCOUNTER — Encounter: Payer: Self-pay | Admitting: Family Medicine
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
## 2016-04-23 ENCOUNTER — Ambulatory Visit (INDEPENDENT_AMBULATORY_CARE_PROVIDER_SITE_OTHER): Payer: Commercial Managed Care - HMO | Admitting: Family Medicine

## 2016-04-23 ENCOUNTER — Encounter: Payer: Self-pay | Admitting: Family Medicine

## 2016-04-23 VITALS — BP 126/80 | HR 65 | Temp 98.2°F | Ht 67.5 in | Wt 207.0 lb

## 2016-04-23 DIAGNOSIS — I10 Essential (primary) hypertension: Secondary | ICD-10-CM | POA: Diagnosis not present

## 2016-04-23 DIAGNOSIS — I739 Peripheral vascular disease, unspecified: Secondary | ICD-10-CM

## 2016-04-23 DIAGNOSIS — I2583 Coronary atherosclerosis due to lipid rich plaque: Secondary | ICD-10-CM

## 2016-04-23 DIAGNOSIS — I2510 Atherosclerotic heart disease of native coronary artery without angina pectoris: Secondary | ICD-10-CM

## 2016-04-23 DIAGNOSIS — R972 Elevated prostate specific antigen [PSA]: Secondary | ICD-10-CM

## 2016-04-23 DIAGNOSIS — E538 Deficiency of other specified B group vitamins: Secondary | ICD-10-CM

## 2016-04-23 DIAGNOSIS — Z7289 Other problems related to lifestyle: Secondary | ICD-10-CM | POA: Diagnosis not present

## 2016-04-23 DIAGNOSIS — K219 Gastro-esophageal reflux disease without esophagitis: Secondary | ICD-10-CM

## 2016-04-23 DIAGNOSIS — E785 Hyperlipidemia, unspecified: Secondary | ICD-10-CM | POA: Diagnosis not present

## 2016-04-23 DIAGNOSIS — Z6831 Body mass index (BMI) 31.0-31.9, adult: Secondary | ICD-10-CM

## 2016-04-23 DIAGNOSIS — Z0000 Encounter for general adult medical examination without abnormal findings: Secondary | ICD-10-CM | POA: Diagnosis not present

## 2016-04-23 LAB — BASIC METABOLIC PANEL
BUN: 17 mg/dL (ref 6–23)
CALCIUM: 9.2 mg/dL (ref 8.4–10.5)
CO2: 29 mEq/L (ref 19–32)
CREATININE: 1.17 mg/dL (ref 0.40–1.50)
Chloride: 103 mEq/L (ref 96–112)
GFR: 64.98 mL/min (ref >60.00)
Glucose, Bld: 105 mg/dL — ABNORMAL HIGH (ref 70–99)
Potassium: 4.4 mEq/L (ref 3.5–5.1)
Sodium: 141 mEq/L (ref 135–145)

## 2016-04-23 LAB — CBC
HEMATOCRIT: 45.2 % (ref 39.0–52.0)
Hemoglobin: 14.8 g/dL (ref 13.0–17.0)
MCHC: 32.6 g/dL (ref 30.0–36.0)
MCV: 84.2 fl (ref 78.0–100.0)
Platelets: 277 10*3/uL (ref 150.0–400.0)
RBC: 5.37 Mil/uL (ref 4.22–5.81)
RDW: 14.3 % (ref 11.5–15.5)
WBC: 8.8 10*3/uL (ref 4.0–10.5)

## 2016-04-23 NOTE — Progress Notes (Signed)
HPI:  Carl Harrell is here for follow up. He had a Wellness Visit with Carl Harrell today. PMH significant for extensive CV dz (sees Dr. Radford Pax, cardiologist), Chronic low back pain and OA of various locations, GERD, B12 deficiency, mildly elevated PSA (he declined further eval with urologist).  Reports doing well. Plans to follow up with his audiologist to check his hearing aide he got 4 years ago that is no longer working. colonoscopy done 2 years ago - he thinks with [facility].  He has no urinary complaints and does not want to doe DRE or see urologist about his chronically mildly elevated PSA. He understands potential implications, limitations or these tests. Denies change in bowel, dysuria, CP, SOB, DOE. See RN notes from wellness visit and below for details of his healthy history. Due for colon cancer screening, hep c screening, shingles vaccine.  CAD/HTN/HLD/Hx TIA, PAD: -LHC 05/2014 with DES to RCA and mod stenosis LAD -cardiologist is Dr. Radford Pax -meds: asa, plavix, ezetimibe, simvastatin, losartan-hctz 50-12.5, lopressor 25  Intermittent Low back pain with sciatica: -meds: flexeril prn  Seasonal allergies: -meds: allegra  GERD: -meds omeprazole '20mg'$  -reports well controlled  B12 def Vit b12 daily  Mildly elevated PSA: -reports chronic mild nocturia -he declined to see a urologist for this and instead preferred to follow -no new or worsening symptoms   ROS: See pertinent positives and negatives per HPI.  Past Medical History:  Diagnosis Date  . ALLERGIC RHINITIS 01/30/2007  . Arthritis    "hands" (05/29/2014)  . Coronary artery disease    LHC (05/29/14): pLAD 50-70, pRCA 99 (L>R collats), EF 55% >> PCI: 3.5 x 28 mm Xience Alpine DES to [employer]  . Cough secondary to angiotensin converting enzyme inhibitor (ACE-I) 05/28/2013  . ED (erectile dysfunction)   . GERD 09/26/2007  . HYPERLIPIDEMIA 09/26/2007  . Hypertension   . PLANTAR FASCIITIS, BILATERAL 07/08/2009  . Unspecified  hearing loss 06/16/2009   wears aides both ears    Past Surgical History:  Procedure Laterality Date  . CARDIAC CATHETERIZATION  05/29/2014   Procedure: CORONARY STENT INTERVENTION;  Surgeon: Sinclair Grooms, MD;  Location: [facility] CATH LAB;  Service: Cardiovascular;;  Prox RCA  . CORONARY ANGIOPLASTY WITH STENT PLACEMENT  05/29/2014   "1"  . KNEE ARTHROSCOPY Right ~ 2005  . LEFT HEART CATHETERIZATION WITH CORONARY ANGIOGRAM N/A 05/29/2014   Procedure: LEFT HEART CATHETERIZATION WITH CORONARY ANGIOGRAM;  Surgeon: Sinclair Grooms, MD;  Location: [facility] CATH LAB;  Service: Cardiovascular;  Laterality: N/A;    Family History  Problem Relation Age of Onset  . Heart attack Mother   . Heart disease Mother   . Heart attack Father   . Heart disease Father   . COPD Neg Hx     family hx  . Hyperlipidemia Neg Hx     family hx    Social History   Social History  . Marital status: Married    Spouse name: N/A  . Number of children: N/A  . Years of education: N/A   Social History Main Topics  . Smoking status: Former Smoker    Types: Cigarettes    Quit date: 05/24/1978  . Smokeless tobacco: Never Used     Comment: 05/29/2014 "smoked socially; nothing since 1980"  . Alcohol use 1.2 oz/week    2 Shots of liquor per week  . Drug use: No  . Sexual activity: Not Currently   Other Topics Concern  . Not on file  Social History Narrative   Lives with wife in a one story home.  Has 2 children.  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  Education: 3 years of college.    Does wood working.      Current Outpatient Prescriptions:  .  aspirin 81 MG chewable tablet, Chew 1 tablet (81 mg total) by mouth daily., Disp: , Rfl:  .  clopidogrel (PLAVIX) 75 MG tablet, take 1 tablet by mouth once daily, Disp: 90 tablet, Rfl: 3 .  Cyanocobalamin (VITAMIN B 12 PO), Take 1 tablet by mouth daily. , Disp: , Rfl:  .  cyclobenzaprine (FLEXERIL) 10 MG tablet, Take 1 tablet (10 mg total) by mouth at bedtime., Disp: 30 tablet, Rfl: 0 .   ezetimibe (ZETIA) 10 MG tablet, Take 1 tablet (10 mg total) by mouth daily., Disp: 90 tablet, Rfl: 3 .  fexofenadine (ALLEGRA) 180 MG tablet, Take 180 mg by mouth daily as needed for allergies or rhinitis., Disp: , Rfl:  .  losartan-hydrochlorothiazide (HYZAAR) 50-12.5 MG tablet, TAKE 1 TABLET EVERY DAY, Disp: 90 tablet, Rfl: 0 .  metoprolol tartrate (LOPRESSOR) 25 MG tablet, Take 1 tablet (25 mg total) by mouth 2 (two) times daily., Disp: 60 tablet, Rfl: 0 .  metoprolol tartrate (LOPRESSOR) 25 MG tablet, take 1 tablet by mouth twice a day, Disp: 180 tablet, Rfl: 5 .  Multiple Vitamin (MULTIVITAMIN WITH MINERALS) TABS tablet, Take 1 tablet by mouth daily., Disp: , Rfl:  .  nitroGLYCERIN (NITROSTAT) 0.4 MG SL tablet, Place 1 tablet (0.4 mg total) under the tongue every 5 (five) minutes as needed for chest pain., Disp: 25 tablet, Rfl: 12 .  omeprazole (PRILOSEC) 20 MG capsule, Take 1 capsule (20 mg total) by mouth 2 (two) times daily., Disp: 60 capsule, Rfl: 0 .  simvastatin (ZOCOR) 40 MG tablet, Take 1 tablet (40 mg total) by mouth at bedtime., Disp: 90 tablet, Rfl: 0 .  simvastatin (ZOCOR) 40 MG tablet, take 1 tablet by mouth at bedtime, Disp: 90 tablet, Rfl: 3  EXAM:  Vitals:   04/23/16 0804  BP: 126/80  Pulse: 65    Body mass index is 31.94 kg/m.  GENERAL: vitals reviewed and listed above, alert, oriented, appears well hydrated and in no acute distress  HEENT: atraumatic, conjunttiva clear, no obvious abnormalities on inspection of external nose and ears  NECK: no obvious masses on inspection  LUNGS: clear to auscultation bilaterally, no wheezes, rales or rhonchi, good air movement  CV: HRRR, no peripheral edema  MS: moves all extremities without noticeable abnormality  PSYCH: pleasant and cooperative, no obvious depression or anxiety  ASSESSMENT AND PLAN:  Discussed the following assessment and plan:  Essential hypertension, benign - Plan: Basic metabolic panel, CBC (no  diff)  Coronary artery disease due to lipid rich plaque  Hyperlipidemia LDL goal <70  PAD (peripheral artery disease) (HCC)  Elevated PSA  B12 deficiency  Gastroesophageal reflux disease without esophagitis  BMI 31.0-31.9,adult  Other problems related to lifestyle - Plan: Hep C Antibody  -labs, including hep c screen per his wishes -while he likely has BPH given mild lower PSA, discussed implications (including cancer)/limitations again of PSA and advised DRE, urology eval - he opted to monitor instead in 1 year, declined DRE - advised prompt eval in interim if any symptoms -advised assistant to obtain colonosocpy report -pt declined shingles vaccine, may consider new vaccine when it comes out -Patient advised to return or notify a doctor immediately if symptoms worsen or persist or new concerns arise.  Patient Instructions   BEFORE YOU LEAVE: -follow up: 3 - 4 months -labs -obtain colonoscopy report and update HM - ? [name]   Mr. Weninger , Thank you for taking time to come for your Medicare Wellness Visit. I appreciate your ongoing commitment to your health goals. Please review the following plan we discussed and let me know if I can assist you in the future.   Educated to check with insurance regarding coverage of Shingles vaccination on Part D or Part B and may have lower co-pay if provided on the Part D side Will await 2018 vaccination    [facility]: [place] benefit  Monday-Friday [address] [place], [place] [address] [phone]   [facility] Services - can assist with hearing aid x 1  No reviews  [place]  [place] [phone]  [phone]  Please sign release so GI (colonoscopy) can fax the office the report   These are the goals we discussed: Goals    . Weight (lb) < 180 lb (81.6 kg)          Eating consistently  Using thyme or fennel when cooking green beans Use the white bulb of the fennel  stem   Fat free or low fat dairy products Fish high in omega-3 acids ( salmon, tuna, trout) Fruits, such as apples, bananas, oranges, pears, prunes Legumes, such as kidney beans, lentils, checkpeas, black-eyed peas and lima beans Vegetables; broccoli, cabbage, carrots Whole grains;   Plant fats are better; decrease "white" foods as pasta, rice, bread and desserts, sugar; Avoid red meat (limiting) palm and coconut oils; sugary foods and beverages  Two nutrients that raise blood chol levels are saturated fats and trans fat; in hydrogenated oils and fats, as stick margarine, baked goods (cookes, cakes, pies, crackers; frosting; and coffee creamers;   Some Fats lower cholesterol: Monounsaturated and polyunsaturated  Avocados Corn, sunflower, and soybean oils Nuts and seeds, such as walnuts Olive, canola, peanut, safflower, and sesame oils Peanut butter Salmon and trout Tofu          This is a list of the screening recommended for you and due dates:  Health Maintenance  Topic Date Due  .  Hepatitis C: One time screening is recommended by Center for Disease Control  (CDC) for  adults born from [birth year] through 1967.   05-16-1946  . Colon Cancer Screening  06/30/1993  . Shingles Vaccine  07/01/2003  . Tetanus Vaccine  03/21/2024  . Flu Shot  Completed  . Pneumonia vaccines  Completed       Colin Benton R., DO

## 2016-04-23 NOTE — Patient Instructions (Addendum)
BEFORE YOU LEAVE: -follow up: 3 - 4 months -labs -obtain colonoscopy report and update HM - ? Carl Harrell   Carl Harrell , Thank you for taking time to come for your Medicare Wellness Visit. I appreciate your ongoing commitment to your health goals. Please review the following plan we discussed and let me know if I can assist you in the future.   Educated to check with insurance regarding coverage of Shingles vaccination on Part D or Part B and may have lower co-pay if provided on the Part D side Will await 2018 vaccination    [facility]: [name] benefit  Monday-Friday [address] [place],  [address] [phone]   Deaf [facility] Hard of Hearing Division Services - can assist with hearing aid x 1  No reviews  [name]  [place] [phone]  [phone]  Please sign release so GI (colonoscopy) can fax the office the report   These are the goals we discussed: Goals    . Weight (lb) < 180 lb (81.6 kg)          Eating consistently  Using thyme or fennel when cooking green beans Use the white bulb of the fennel stem   Fat free or low fat dairy products Fish high in omega-3 acids ( salmon, tuna, trout) Fruits, such as apples, bananas, oranges, pears, prunes Legumes, such as kidney beans, lentils, checkpeas, black-eyed peas and lima beans Vegetables; broccoli, cabbage, carrots Whole grains;   Plant fats are better; decrease "white" foods as pasta, rice, bread and desserts, sugar; Avoid red meat (limiting) palm and coconut oils; sugary foods and beverages  Two nutrients that raise blood chol levels are saturated fats and trans fat; in hydrogenated oils and fats, as stick margarine, baked goods (cookes, cakes, pies, crackers; frosting; and coffee creamers;   Some Fats lower cholesterol: Monounsaturated and polyunsaturated  Avocados Corn, sunflower, and soybean oils Nuts and seeds, such as walnuts Olive, canola, peanut, safflower, and sesame oils Peanut  butter Salmon and trout Tofu          This is a list of the screening recommended for you and due dates:  Health Maintenance  Topic Date Due  .  Hepatitis C: One time screening is recommended by Center for Disease Control  (CDC) for  adults born from [birth year] through 1967.   [birth year]  . Colon Cancer Screening  06/30/1993  . Shingles Vaccine  07/01/2003  . Tetanus Vaccine  03/21/2024  . Flu Shot  Completed  . Pneumonia vaccines  Completed

## 2016-04-24 LAB — HEPATITIS C ANTIBODY: HCV AB: NEGATIVE

## 2016-04-27 ENCOUNTER — Other Ambulatory Visit: Payer: Self-pay | Admitting: Family Medicine

## 2016-05-03 ENCOUNTER — Other Ambulatory Visit: Payer: Self-pay | Admitting: *Deleted

## 2016-05-03 ENCOUNTER — Other Ambulatory Visit: Payer: Self-pay | Admitting: Cardiology

## 2016-05-03 MED ORDER — EZETIMIBE 10 MG PO TABS: 10.0000 mg | ORAL_TABLET | Freq: Every day | ORAL | 3 refills | Status: DC

## 2016-05-03 NOTE — Telephone Encounter (Signed)
[employer] faxed a refill request for the following medications and these were last given by Dr Sherren Mocha.

## 2016-05-04 MED ORDER — SIMVASTATIN 40 MG PO TABS
40.0000 mg | ORAL_TABLET | Freq: Every day | ORAL | 3 refills | Status: DC
Start: 2016-05-04 — End: 2016-07-23

## 2016-05-04 MED ORDER — METOPROLOL TARTRATE 25 MG PO TABS: 25.0000 mg | ORAL_TABLET | Freq: Two times a day (BID) | ORAL | 3 refills | Status: DC

## 2016-05-04 MED ORDER — LOSARTAN POTASSIUM-HCTZ 50-12.5 MG PO TABS: 1.0000 | ORAL_TABLET | Freq: Every day | ORAL | 3 refills | Status: DC

## 2016-05-04 NOTE — Telephone Encounter (Signed)
Please let him know. Ok to refill BP and cholesterol meds for 1 year - please send. He should request plavix refills from cardiologist. Looks like his cardiologist sent 1 year of refills for plavix in April.Thanks.

## 2016-05-04 NOTE — Telephone Encounter (Signed)
I called the pt and informed him of the message below. 

## 2016-05-06 ENCOUNTER — Other Ambulatory Visit: Payer: Self-pay | Admitting: Family Medicine

## 2016-05-06 MED ORDER — CLOPIDOGREL BISULFATE 75 MG PO TABS: 75.0000 mg | ORAL_TABLET | Freq: Every day | ORAL | 3 refills | Status: DC

## 2016-05-10 ENCOUNTER — Encounter: Payer: Self-pay | Admitting: Family Medicine

## 2016-05-19 ENCOUNTER — Other Ambulatory Visit: Payer: Self-pay | Admitting: Emergency Medicine

## 2016-06-24 ENCOUNTER — Other Ambulatory Visit: Payer: Self-pay | Admitting: Family Medicine

## 2016-07-15 ENCOUNTER — Other Ambulatory Visit: Payer: Self-pay | Admitting: Cardiology

## 2016-07-22 NOTE — Progress Notes (Signed)
HPI:  Carl Harrell is a pleasant 71 y.o. here for follow up. Chronic medical problems summarized below were reviewed for changes and stability and were updated as needed below. These issues and their treatment remain stable for the most part. He is doing well without complaints. [facility] Hopes to do more. Diet is not great. He admits he does like sweets.. Denies CP, SOB, DOE, swelling, palpitations, treatment intolerance or new symptoms.  CAD/HTN/HLD/Hx TIA, PAD: -LHC 05/2014 with DES to RCA and mod stenosis LAD -cardiologist is Dr. Radford Pax -meds: asa, plavix, ezetimibe, simvastatin, losartan-hctz 50-12.5, lopressor 25  Hyperglycemia/Obesity: -lifestyle recs advised  Intermittent Low back pain with sciatica: -meds: flexeril prn  Seasonal allergies: -meds: allegra  GERD: -meds: omeprazole '20mg'$   B12 def -takes Vit b12 daily  Mildly elevated PSA: -reports chronic mild nocturia, unchanged -he declined to see a urologist for this and refused DRE -no new or worsening symptoms  ROS: See pertinent positives and negatives per HPI.  Past Medical History:  Diagnosis Date  . ALLERGIC RHINITIS 01/30/2007  . Arthritis    "hands" (05/29/2014)  . Coronary artery disease    LHC (05/29/14): pLAD 50-70, pRCA 99 (L>R collats), EF 55% >> PCI: 3.5 x 28 mm Xience Alpine DES to [employer]  . Cough secondary to angiotensin converting enzyme inhibitor (ACE-I) 05/28/2013  . ED (erectile dysfunction)   . GERD 09/26/2007  . HYPERLIPIDEMIA 09/26/2007  . Hypertension   . PLANTAR FASCIITIS, BILATERAL 07/08/2009  . Unspecified hearing loss 06/16/2009   wears aides both ears    Past Surgical History:  Procedure Laterality Date  . CARDIAC CATHETERIZATION  05/29/2014   Procedure: CORONARY STENT INTERVENTION;  Surgeon: Sinclair Grooms, MD;  Location: [facility] CATH LAB;  Service: Cardiovascular;;  Prox RCA  . CORONARY ANGIOPLASTY WITH STENT PLACEMENT  05/29/2014   "1"  . KNEE ARTHROSCOPY  Right ~ 2005  . LEFT HEART CATHETERIZATION WITH CORONARY ANGIOGRAM N/A 05/29/2014   Procedure: LEFT HEART CATHETERIZATION WITH CORONARY ANGIOGRAM;  Surgeon: Sinclair Grooms, MD;  Location: [facility] CATH LAB;  Service: Cardiovascular;  Laterality: N/A;    Family History  Problem Relation Age of Onset  . Heart attack Mother   . Heart disease Mother   . Heart attack Father   . Heart disease Father   . COPD Neg Hx     family hx  . Hyperlipidemia Neg Hx     family hx    Social History   Social History  . Marital status: Married    Spouse name: N/A  . Number of children: N/A  . Years of education: N/A   Social History Main Topics  . Smoking status: Former Smoker    Types: Cigarettes    Quit date: 05/24/1978  . Smokeless tobacco: Never Used     Comment: 05/29/2014 "smoked socially; nothing since 1980"  . Alcohol use 1.2 oz/week    2 Shots of liquor per week  . Drug use: No  . Sexual activity: Not Currently   Other Topics Concern  . None   Social History Narrative   Lives with wife in a one story home.  Has 2 children.  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  Education: 3 years of college.    Does wood working.      Current Outpatient Prescriptions:  .  aspirin 81 MG chewable tablet, Chew 1 tablet (81 mg total) by mouth daily., Disp: , Rfl:  .  clopidogrel (PLAVIX) 75  MG tablet, Take 1 tablet (75 mg total) by mouth daily., Disp: 90 tablet, Rfl: 3 .  Cyanocobalamin (VITAMIN B 12 PO), Take 1 tablet by mouth daily. , Disp: , Rfl:  .  cyclobenzaprine (FLEXERIL) 10 MG tablet, Take 1 tablet (10 mg total) by mouth at bedtime., Disp: 30 tablet, Rfl: 0 .  ezetimibe (ZETIA) 10 MG tablet, Take 1 tablet (10 mg total) by mouth daily., Disp: 90 tablet, Rfl: 3 .  fexofenadine (ALLEGRA) 180 MG tablet, Take 180 mg by mouth daily as needed for allergies or rhinitis., Disp: , Rfl:  .  losartan-hydrochlorothiazide (HYZAAR) 50-12.5 MG tablet, Take 1 tablet by mouth daily., Disp: 90 tablet, Rfl: 3 .  metoprolol tartrate  (LOPRESSOR) 25 MG tablet, take 1 tablet by mouth twice a day, Disp: 180 tablet, Rfl: 5 .  Multiple Vitamin (MULTIVITAMIN WITH MINERALS) TABS tablet, Take 1 tablet by mouth daily., Disp: , Rfl:  .  NITROSTAT 0.4 MG SL tablet, PLACE 1 TABLET BY MOUTH UNDER TONGUE EVERY 5 MINUTES AS NEEDED FOR CHEST PAIN, Disp: 25 tablet, Rfl: 12 .  omeprazole (PRILOSEC) 20 MG capsule, take 1 capsule by mouth twice a day, Disp: 100 capsule, Rfl: 4 .  simvastatin (ZOCOR) 40 MG tablet, take 1 tablet by mouth at bedtime, Disp: 90 tablet, Rfl: 3  EXAM:  Vitals:   07/23/16 0817  BP: 104/80  Pulse: 72  Temp: 97.6 F (36.4 C)    Body mass index is 32.42 kg/m.  GENERAL: vitals reviewed and listed above, alert, oriented, appears well hydrated and in no acute distress  HEENT: atraumatic, conjunttiva clear, no obvious abnormalities on inspection of external nose and ears  NECK: no obvious masses on inspection  LUNGS: clear to auscultation bilaterally, no wheezes, rales or rhonchi, good air movement  CV: HRRR, no peripheral edema  MS: moves all extremities without noticeable abnormality  PSYCH: pleasant and cooperative, no obvious depression or anxiety  ASSESSMENT AND PLAN:  Discussed the following assessment and plan:  Hyperlipidemia LDL goal <70  Essential hypertension, benign  Coronary artery disease due to lipid rich plaque  PAD (peripheral artery disease) (HCC)  Hx-TIA (transient ischemic attack)  Hyperglycemia  BMI 32.0-32.9,adult  -lifestyle recs -Medicare exam and labs next visit -continue current medications -Patient advised to return or notify a doctor immediately if symptoms worsen or persist or new concerns arise.  Patient Instructions  BEFORE YOU LEAVE: -follow up: Medicare exam with susan and follow up with Dr. Maudie Mercury in 3 months (labs that day, come fasting if convenient)   We recommend the following healthy lifestyle for LIFE: 1) Small portions.   Tip: eat off of a salad  plate instead of a dinner plate.  Tip: if you need more or a snack choose fruits, veggies and/or a handful of nuts or seeds.  2) Eat a healthy clean diet.  * Tip: Avoid (less then 1 serving per week): processed foods, sweets, sweetened drinks, white starches (rice, flour, bread, potatoes, pasta, etc), red meat, fast foods, butter  *Tip: CHOOSE instead   * 5-9 servings per day of fresh or frozen fruits and vegetables (but not corn, potatoes, bananas, canned or dried fruit)   *nuts and seeds, beans   *olives and olive oil   *small portions of lean meats such as fish and white chicken    *small portions of whole grains  3)Get at least 150 minutes of sweaty aerobic exercise per week.  4)Reduce stress - consider counseling, meditation and relaxation to balance  other aspects of your life.  **Coming March 12th**  [facility]!!!  Same Day Appointments for Acute Care: Sprains, Injuries, cuts, abrasions Colds, flu, sore throats, cough, upset stomachs Fever, ear pain Sinus and eye infections Animal/insect bites  3 Easy Ways to Schedule: Walk-In Scheduling Call in scheduling Mychart Sign-up: [email or web address]            Colin Benton R., DO

## 2016-07-23 ENCOUNTER — Ambulatory Visit (INDEPENDENT_AMBULATORY_CARE_PROVIDER_SITE_OTHER): Payer: Medicare HMO | Admitting: Family Medicine

## 2016-07-23 ENCOUNTER — Encounter: Payer: Self-pay | Admitting: Family Medicine

## 2016-07-23 VITALS — BP 104/80 | HR 72 | Temp 97.6°F | Ht 67.5 in | Wt 210.1 lb

## 2016-07-23 DIAGNOSIS — Z6832 Body mass index (BMI) 32.0-32.9, adult: Secondary | ICD-10-CM

## 2016-07-23 DIAGNOSIS — I2510 Atherosclerotic heart disease of native coronary artery without angina pectoris: Secondary | ICD-10-CM

## 2016-07-23 DIAGNOSIS — Z8673 Personal history of transient ischemic attack (TIA), and cerebral infarction without residual deficits: Secondary | ICD-10-CM

## 2016-07-23 DIAGNOSIS — R739 Hyperglycemia, unspecified: Secondary | ICD-10-CM | POA: Diagnosis not present

## 2016-07-23 DIAGNOSIS — I739 Peripheral vascular disease, unspecified: Secondary | ICD-10-CM | POA: Diagnosis not present

## 2016-07-23 DIAGNOSIS — I10 Essential (primary) hypertension: Secondary | ICD-10-CM

## 2016-07-23 DIAGNOSIS — E785 Hyperlipidemia, unspecified: Secondary | ICD-10-CM | POA: Diagnosis not present

## 2016-07-23 DIAGNOSIS — I2583 Coronary atherosclerosis due to lipid rich plaque: Secondary | ICD-10-CM | POA: Diagnosis not present

## 2016-07-23 NOTE — Patient Instructions (Signed)
BEFORE YOU LEAVE: -follow up: Medicare exam with susan and follow up with Dr. Maudie Mercury in 3 months (labs that day, come fasting if convenient)   We recommend the following healthy lifestyle for LIFE: 1) Small portions.   Tip: eat off of a salad plate instead of a dinner plate.  Tip: if you need more or a snack choose fruits, veggies and/or a handful of nuts or seeds.  2) Eat a healthy clean diet.  * Tip: Avoid (less then 1 serving per week): processed foods, sweets, sweetened drinks, white starches (rice, flour, bread, potatoes, pasta, etc), red meat, fast foods, butter  *Tip: CHOOSE instead   * 5-9 servings per day of fresh or frozen fruits and vegetables (but not corn, potatoes, bananas, canned or dried fruit)   *nuts and seeds, beans   *olives and olive oil   *small portions of lean meats such as fish and white chicken    *small portions of whole grains  3)Get at least 150 minutes of sweaty aerobic exercise per week.  4)Reduce stress - consider counseling, meditation and relaxation to balance other aspects of your life.  **Coming March 12th**  [facility]!!!  Same Day Appointments for Acute Care: Sprains, Injuries, cuts, abrasions Colds, flu, sore throats, cough, upset stomachs Fever, ear pain Sinus and eye infections Animal/insect bites  3 Easy Ways to Schedule: Walk-In Scheduling Call in scheduling Mychart Sign-up: [email or web address]

## 2016-07-23 NOTE — Progress Notes (Signed)
Pre visit review using our clinic review tool, if applicable. No additional management support is needed unless otherwise documented below in the visit note. 

## 2016-08-05 ENCOUNTER — Ambulatory Visit (INDEPENDENT_AMBULATORY_CARE_PROVIDER_SITE_OTHER): Payer: Medicare HMO | Admitting: Cardiology

## 2016-08-05 ENCOUNTER — Encounter: Payer: Self-pay | Admitting: Cardiology

## 2016-08-05 VITALS — BP 132/82 | HR 58 | Ht 67.5 in | Wt 211.4 lb

## 2016-08-05 DIAGNOSIS — I2510 Atherosclerotic heart disease of native coronary artery without angina pectoris: Secondary | ICD-10-CM | POA: Diagnosis not present

## 2016-08-05 DIAGNOSIS — E785 Hyperlipidemia, unspecified: Secondary | ICD-10-CM

## 2016-08-05 DIAGNOSIS — I10 Essential (primary) hypertension: Secondary | ICD-10-CM | POA: Diagnosis not present

## 2016-08-05 LAB — BASIC METABOLIC PANEL
BUN / CREAT RATIO: 13 (ref 10–24)
BUN: 14 mg/dL (ref 8–27)
CO2: 22 mmol/L (ref 18–29)
CREATININE: 1.11 mg/dL (ref 0.76–1.27)
Calcium: 9.3 mg/dL (ref 8.6–10.2)
Chloride: 101 mmol/L (ref 96–106)
GFR calc Af Amer: 76 mL/min/{1.73_m2} (ref >59)
GFR, EST NON AFRICAN AMERICAN: 66 mL/min/{1.73_m2} (ref >59)
GLUCOSE: 118 mg/dL — AB (ref 65–99)
Potassium: 4.3 mmol/L (ref 3.5–5.2)
Sodium: 141 mmol/L (ref 134–144)

## 2016-08-05 LAB — HEPATIC FUNCTION PANEL
ALBUMIN: 4 g/dL (ref 3.5–4.8)
ALK PHOS: 51 IU/L (ref 39–117)
ALT: 33 IU/L (ref 0–44)
AST: 21 IU/L (ref 0–40)
BILIRUBIN, DIRECT: 0.16 mg/dL (ref 0.00–0.40)
Bilirubin Total: 0.6 mg/dL (ref 0.0–1.2)
TOTAL PROTEIN: 6.5 g/dL (ref 6.0–8.5)

## 2016-08-05 LAB — LIPID PANEL
Chol/HDL Ratio: 3.4 ratio units (ref 0.0–5.0)
Cholesterol, Total: 130 mg/dL (ref 100–199)
HDL: 38 mg/dL — ABNORMAL LOW (ref >39)
LDL CALC: 68 mg/dL (ref 0–99)
Triglycerides: 121 mg/dL (ref 0–149)
VLDL Cholesterol Cal: 24 mg/dL (ref 5–40)

## 2016-08-05 NOTE — Patient Instructions (Signed)

## 2016-08-05 NOTE — Progress Notes (Signed)
Cardiology Office Note    Date:  08/05/2016   ID:  HJALMER IOVINO, DOB [birth year], MRN [id number]  PCP:  Lucretia Kern., DO  Cardiologist:  Fransico Him, MD   Chief Complaint  Patient presents with  . Coronary Artery Disease  . Hypertension  . Hyperlipidemia    History of Present Illness:  Carl Harrell is a 71 y.o. male with a history of prior TIA, HL, GERD, PAD, ASCAD with LHC 05/29/14 demonstrating a subtotally occluded native RCA with 99% proximal stenosis which was treated with a DES. There was moderate stenosis noted in the LAD at 50-70%. There was normal anterior perfusion on nuclear stress test. Dr. Tamala Julian performed this procedure and noted that if he had continued anginal symptoms, consideration should be given towards repeating nuclear study to assess for anterior ischemia with an eye towards PCI of the LAD.   Today he is back for followup.  He has been doing well since his PCI of RCA. He denies any chest pain or pressure, SOB, DOE, PND, orthopnea, LE edema, dizziness, palpitations or syncope.  He denies any claudication symptoms.  He continues to work out at [employer] a few times weekly.    Past Medical History:  Diagnosis Date  . ALLERGIC RHINITIS 01/30/2007  . Arthritis    "hands" (05/29/2014)  . Coronary artery disease    LHC (05/29/14): pLAD 50-70, pRCA 99 (L>R collats), EF 55% >> PCI: 3.5 x 28 mm Xience Alpine DES to [employer]  . Cough secondary to angiotensin converting enzyme inhibitor (ACE-I) 05/28/2013  . ED (erectile dysfunction)   . GERD 09/26/2007  . HYPERLIPIDEMIA 09/26/2007  . Hypertension   . PLANTAR FASCIITIS, BILATERAL 07/08/2009  . Unspecified hearing loss 06/16/2009   wears aides both ears    Past Surgical History:  Procedure Laterality Date  . CARDIAC CATHETERIZATION  05/29/2014   Procedure: CORONARY STENT INTERVENTION;  Surgeon: Sinclair Grooms, MD;  Location: [facility] CATH LAB;  Service: Cardiovascular;;  Prox RCA  . CORONARY ANGIOPLASTY WITH STENT  PLACEMENT  05/29/2014   "1"  . KNEE ARTHROSCOPY Right ~ 2005  . LEFT HEART CATHETERIZATION WITH CORONARY ANGIOGRAM N/A 05/29/2014   Procedure: LEFT HEART CATHETERIZATION WITH CORONARY ANGIOGRAM;  Surgeon: Sinclair Grooms, MD;  Location: [facility] CATH LAB;  Service: Cardiovascular;  Laterality: N/A;    Current Medications: Current Meds  Medication Sig  . aspirin 81 MG chewable tablet Chew 1 tablet (81 mg total) by mouth daily.  Marland Kitchen ezetimibe (ZETIA) 10 MG tablet Take 1 tablet (10 mg total) by mouth daily.  . fexofenadine (ALLEGRA) 180 MG tablet Take 180 mg by mouth daily as needed for allergies or rhinitis.  Marland Kitchen losartan-hydrochlorothiazide (HYZAAR) 50-12.5 MG tablet Take 1 tablet by mouth daily.  . metoprolol tartrate (LOPRESSOR) 25 MG tablet take 1 tablet by mouth twice a day  . Multiple Vitamin (MULTIVITAMIN WITH MINERALS) TABS tablet Take 1 tablet by mouth daily.  Marland Kitchen NITROSTAT 0.4 MG SL tablet PLACE 1 TABLET BY MOUTH UNDER TONGUE EVERY 5 MINUTES AS NEEDED FOR CHEST PAIN  . omeprazole (PRILOSEC) 20 MG capsule take 1 capsule by mouth twice a day  . simvastatin (ZOCOR) 40 MG tablet take 1 tablet by mouth at bedtime    Allergies:   Lisinopril   Social History   Social History  . Marital status: Married    Spouse name: N/A  . Number of children: N/A  . Years of education: N/A   Social History  Main Topics  . Smoking status: Former Smoker    Types: Cigarettes    Quit date: 05/24/1978  . Smokeless tobacco: Never Used     Comment: 05/29/2014 "smoked socially; nothing since 1980"  . Alcohol use 1.2 oz/week    2 Shots of liquor per week  . Drug use: No  . Sexual activity: Not Currently   Other Topics Concern  . None   Social History Narrative   Lives with wife in a one story home.  Has 2 children.  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  Education: 3 years of college.    Does wood working.      Family History:  The patient's family history includes Heart attack in his father and mother; Heart disease in his  father and mother.   ROS:   Please see the history of present illness.    ROS All other systems reviewed and are negative.  No flowsheet data found.     PHYSICAL EXAM:   VS:  BP 132/82   Pulse (!) 58   Ht 5' 7.5" (1.715 m)   Wt 211 lb 6.4 oz (95.9 kg)   BMI 32.62 kg/m    GEN: Well nourished, well developed, in no acute distress  HEENT: normal  Neck: no JVD, carotid bruits, or masses Cardiac: RRR; no murmurs, rubs, or gallops,no edema.  Intact distal pulses bilaterally.  Respiratory:  clear to auscultation bilaterally, normal work of breathing GI: soft, nontender, nondistended, + BS MS: no deformity or atrophy  Skin: warm and dry, no rash Neuro:  Alert and Oriented x 3, Strength and sensation are intact Psych: euthymic mood, full affect  Wt Readings from Last 3 Encounters:  08/05/16 211 lb 6.4 oz (95.9 kg)  07/23/16 210 lb 1.6 oz (95.3 kg)  04/23/16 207 lb (93.9 kg)      Studies/Labs Reviewed:   EKG:  EKG is not ordered today.    Recent Labs: 09/22/2015: ALT 26 04/23/2016: BUN 17; Creatinine, Ser 1.17; Hemoglobin 14.8; Platelets 277.0; Potassium 4.4; Sodium 141   Lipid Panel    Component Value Date/Time   CHOL 118 (L) 09/22/2015 0835   TRIG 104 09/22/2015 0835   HDL 36 (L) 09/22/2015 0835   CHOLHDL 3.3 09/22/2015 0835   VLDL 21 09/22/2015 0835   LDLCALC 61 09/22/2015 0835    Additional studies/ records that were reviewed today include:  none    ASSESSMENT:    1. Coronary artery disease involving native coronary artery of native heart without angina pectoris   2. Essential hypertension, benign   3. Hyperlipidemia LDL goal <70      PLAN:  In order of problems listed above:  1. ASCAD with remote PCI of the RCA with residual borderline obstructive disease of the LAD.  He is doing well and has not had any anginal.  Continue on ASA/Plavix/BB/statin. 2. HTN - BP remains controlled on current meds.  He will continue on ARB/BB/diuertic. 3. Hyperlipidemia  with LDL goal < 70.  Continue statin.  I will check an FLP and ALT today.    Medication Adjustments/Labs and Tests Ordered: Current medicines are reviewed at length with the patient today.  Concerns regarding medicines are outlined above.  Medication changes, Labs and Tests ordered today are listed in the Patient Instructions below.  There are no Patient Instructions on file for this visit.   Signed, Fransico Him, MD  08/05/2016 8:11 AM    [place] [address] [place], [place], [place]  [address]  Phone: [phone]; Fax: [phone]

## 2016-08-23 NOTE — Progress Notes (Signed)
Subjective:   Carl Harrell is a 71 y.o. male who presents for Medicare Annual/Subsequent preventive examination.  The Patient was informed that the wellness visit is to identify future health risk and educate and initiate measures that can reduce risk for increased disease through the lifespan.    NO ROS; Medicare Wellness Visit  Describes health as good, fair or great?   Preventive Screening -Counseling & Management  Colonoscopy 05.2013  Smoking history - Former smoker Quit 44; 71 yo Never smoked for habit  Also educated regarding AAA for male tobacco users with smoking hx  Will order  Smokeless tobacco no Second Hand Smoke status; No Smokers in the home ETOH beer or 2 a week  Medication adherence or issues? No   RISK FACTORS Diet: don't eat much during the day Starts eating at 3am; habit when he was working Up early in the am and work until [employer] he is sleepy during the day. He does snore; states he sleeps at hs but can sleep easily during the day.  Goes to bed 10:30 - 7am may get up at hs and watch tv  Eats lots of pleasure food; bread  Does eat meats; vegetables and fruits   [facility]; works out for one hour Bowls 3 times a week   Cardiac Risk Factors:  Stent placement 05/2014 Advanced aged > 47 in men; >65 in women Hyperlipidemia - chod 130; HDL 38; LDL 68; Trig 121 Diabetes (glucose elevated 118 07/2016 Family History MI, mother and father Obesity bMI 13  Have never had to lose weight Son runs marathons;   Fall risk no  Given education on "Fall Prevention in the Home" for more safety tips the patient can apply as appropriate.  Long term goal is to "age in place" or undecided   Mobility of Functional changes this year? No  One level; wife is going to the beach;   Safety; community, wears sunscreen, safe place for firearms; Motor vehicle accidents;   Mental Health:  Any emotional problems? Anxious, depressed, irritable,  sad or blue? No  Denies feeling depressed or hopeless; voices pleasure in daily life no How many social activities have you been engaged in within the last 2 weeks? Is picking his activities back up Staring to bowl again   Activities of Daily Living - See functional screen   Cognitive testing; Ad8 score; 0 or less than 2  MMSE deferred or completed if AD8 + 2 issues  Advanced Directives has form but has yet to complete  Patient Care Team: Lucretia Kern, DO as PCP - General (Family Medicine) Sueanne Margarita, MD as Consulting Physician (Cardiology)   Immunization History  Administered Date(s) Administered  . Influenza Split 03/03/2011, 02/01/2012, 04/27/2013  . Influenza Whole 05/24/2004  . Influenza, High Dose Seasonal PF 01/16/2016  . Influenza,inj,Quad PF,36+ Mos 02/22/2013, 03/13/2014  . Influenza-Unspecified 03/07/2015  . Pneumococcal Conjugate-13 03/21/2014  . Pneumococcal Polysaccharide-23 10/03/2008  . Td 05/24/2002  . Tetanus 03/21/2014   Required Immunizations needed today  Screening test up to date or reviewed for plan of completion There are no preventive care reminders to display for this patient.  Cardiac Risk Factors include: advanced age (>85mn, >>54women);dyslipidemia;hypertension;male gender;family history of premature cardiovascular disease;obesity (BMI >30kg/m2)     Objective:    Vitals: BP 108/60   Pulse 84   Ht '5\' 7"'$  (1.702 m)   Wt 213 lb 2 oz (96.7 kg)   SpO2  96%   BMI 33.38 kg/m   Body mass index is 33.38 kg/m.  Tobacco History  Smoking Status  . Former Smoker  . Types: Cigarettes  . Quit date: 05/24/1978  Smokeless Tobacco  . Never Used    Comment: 05/29/2014 "smoked socially; nothing since 1980"     Counseling given: Not Answered   Past Medical History:  Diagnosis Date  . ALLERGIC RHINITIS 01/30/2007  . Arthritis    "hands" (05/29/2014)  . Coronary artery disease    LHC (05/29/14): pLAD 50-70, pRCA 99 (L>R collats), EF 55% >> PCI:  3.5 x 28 mm Xience [employer]pine DES to [employer]  . Cough secondary to angiotensin converting enzyme inhibitor (ACE-I) 05/28/2013  . ED (erectile dysfunction)   . GERD 09/26/2007  . HYPERLIPIDEMIA 09/26/2007  . Hypertension   . PLANTAR FASCIITIS, BILATERAL 07/08/2009  . Unspecified hearing loss 06/16/2009   wears aides both ears   Past Surgical History:  Procedure Laterality Date  . CARDIAC CATHETERIZATION  05/29/2014   Procedure: CORONARY STENT INTERVENTION;  Surgeon: Sinclair Grooms, MD;  Location: [facility] CATH LAB;  Service: Cardiovascular;;  Prox RCA  . CORONARY ANGIOPLASTY WITH STENT PLACEMENT  05/29/2014   "1"  . KNEE ARTHROSCOPY Right ~ 2005  . LEFT HEART CATHETERIZATION WITH CORONARY ANGIOGRAM N/A 05/29/2014   Procedure: LEFT HEART CATHETERIZATION WITH CORONARY ANGIOGRAM;  Surgeon: Sinclair Grooms, MD;  Location: [facility] CATH LAB;  Service: Cardiovascular;  Laterality: N/A;   Family History  Problem Relation Age of Onset  . Heart attack Mother   . Heart disease Mother   . Heart attack Father   . Heart disease Father   . COPD Neg Hx     family hx  . Hyperlipidemia Neg Hx     family hx   History  Sexual Activity  . Sexual activity: Not Currently    Outpatient Encounter Prescriptions as of 08/24/2016  Medication Sig  . aspirin 81 MG chewable tablet Chew 1 tablet (81 mg total) by mouth daily.  Marland Kitchen ezetimibe (ZETIA) 10 MG tablet Take 1 tablet (10 mg total) by mouth daily.  . fexofenadine (ALLEGRA) 180 MG tablet Take 180 mg by mouth daily as needed for allergies or rhinitis.  Marland Kitchen losartan-hydrochlorothiazide (HYZAAR) 50-12.5 MG tablet Take 1 tablet by mouth daily.  . metoprolol tartrate (LOPRESSOR) 25 MG tablet take 1 tablet by mouth twice a day  . Multiple Vitamin (MULTIVITAMIN WITH MINERALS) TABS tablet Take 1 tablet by mouth daily.  Marland Kitchen NITROSTAT 0.4 MG SL tablet PLACE 1 TABLET BY MOUTH UNDER TONGUE EVERY 5 MINUTES AS NEEDED FOR CHEST PAIN  . omeprazole (PRILOSEC) 20 MG capsule take 1 capsule by mouth  twice a day  . simvastatin (ZOCOR) 40 MG tablet take 1 tablet by mouth at bedtime   No facility-administered encounter medications on file as of 08/24/2016.     Activities of Daily Living In your present state of health, do you have any difficulty performing the following activities: 08/24/2016 04/23/2016  Hearing? Tempie Donning  Vision? N N  Difficulty concentrating or making decisions? N N  Walking or climbing stairs? N N  Dressing or bathing? N N  Doing errands, shopping? N N  Preparing Food and eating ? N N  Using the Toilet? N N  In the past six months, have you accidently leaked urine? N N  Do you have problems with loss of bowel control? N N  Managing your Medications? N N  Managing your Finances? N N  Housekeeping or managing your Housekeeping? N N  Some recent data might be hidden    Patient Care Team: Lucretia Kern, DO as PCP - General (Family Medicine) Sueanne Margarita, MD as Consulting Physician (Cardiology)   Assessment:     Exercise Activities and Dietary recommendations Current Exercise Habits: Home exercise routine, Time (Minutes): 45, Frequency (Times/Week): 5, Weekly Exercise (Minutes/Week): 225, Intensity: Mild  Goals    . Exercise 150 minutes per week (moderate activity)          [facility]  Longer walks; lives in [place];  Walk in the am; walk one mile - 5 days  [place] is one mile around     . Weight (lb) < 180 lb (81.6 kg)          Eating consistently  Using thyme or fennel when cooking green beans Use the white bulb of the fennel stem   Fat free or low fat dairy products Fish high in omega-3 acids ( salmon, tuna, trout) Fruits, such as apples, bananas, oranges, pears, prunes Legumes, such as kidney beans, lentils, checkpeas, black-eyed peas and lima beans Vegetables; broccoli, cabbage, carrots Whole grains;   Plant fats are better; decrease "white" foods as pasta, rice, bread and desserts, sugar; Avoid red meat (limiting) palm and  coconut oils; sugary foods and beverages  Two nutrients that raise blood chol levels are saturated fats and trans fat; in hydrogenated oils and fats, as stick margarine, baked goods (cookes, cakes, pies, crackers; frosting; and coffee creamers;   Some Fats lower cholesterol: Monounsaturated and polyunsaturated  Avocados Corn, sunflower, and soybean oils Nuts and seeds, such as walnuts Olive, canola, peanut, safflower, and sesame oils Peanut butter Salmon and trout Tofu         Fall Risk Fall Risk  08/24/2016 04/23/2016 06/25/2013  Falls in the past year? No Yes No  Number falls in past yr: - 1 -  Follow up - Education provided -   Depression Screen PHQ 2/9 Scores 08/24/2016 04/23/2016 07/10/2014 06/25/2013  PHQ - 2 Score 0 0 0 0    Cognitive Function     6CIT Screen 04/23/2016  What Year? 0 points  What month? 0 points  What time? 0 points  Count back from 20 0 points  Months in reverse 0 points  Repeat phrase 0 points  Total Score 0    Immunization History  Administered Date(s) Administered  . Influenza Split 03/03/2011, 02/01/2012, 04/27/2013  . Influenza Whole 05/24/2004  . Influenza, High Dose Seasonal PF 01/16/2016  . Influenza,inj,Quad PF,36+ Mos 02/22/2013, 03/13/2014  . Influenza-Unspecified 03/07/2015  . Pneumococcal Conjugate-13 03/21/2014  . Pneumococcal Polysaccharide-23 10/03/2008  . Td 05/24/2002  . Tetanus 03/21/2014   Screening Tests Health Maintenance  Topic Date Due  . INFLUENZA VACCINE  12/22/2016  . COLONOSCOPY  05/25/2023  . TETANUS/TDAP  03/21/2024  . Hepatitis C Screening  Completed  . PNA vac Low Risk Adult  Completed      Plan:      PCP Notes  Health Maintenance Up to date   Abnormal Screens Hearing 1000 hz but has had hearing check Is trying to decide if he want hearing aids again or what type  I need of dental work but very expensive. Given information on [employer] [facility]   Referrals  AAA for 100 cigarette smoking hx    Patient concerns; none  Nurse Concerns; States he does snore but hard to assess sleeping hx;  Educated regarding sleep apnea; does not feel he has this at this time. At first stated he can go to sleep easily during the day; but then states he sleeps from 10 to 7 and sometimes wakes up during the night;   Next PCP apt  06/04   During the course of the visit the patient was educated and counseled about the following appropriate screening and preventive services:   Vaccines to include Pneumoccal, Influenza, Hepatitis B, Td, Zostavax, HCV  Electrocardiogram  Cardiovascular Disease  Colorectal cancer screening  Diabetes screening  Prostate Cancer Screening  Glaucoma screening  Nutrition counseling   Smoking cessation counseling  Patient Instructions (the written plan) was given to the patient.    Wynetta Fines, RN  08/24/2016

## 2016-08-24 ENCOUNTER — Ambulatory Visit (INDEPENDENT_AMBULATORY_CARE_PROVIDER_SITE_OTHER): Payer: Medicare HMO

## 2016-08-24 VITALS — BP 108/60 | HR 84 | Ht 67.0 in | Wt 213.1 lb

## 2016-08-24 DIAGNOSIS — Z87891 Personal history of nicotine dependence: Secondary | ICD-10-CM

## 2016-08-24 DIAGNOSIS — Z0000 Encounter for general adult medical examination without abnormal findings: Secondary | ICD-10-CM | POA: Diagnosis not present

## 2016-08-24 NOTE — Progress Notes (Signed)
Rahima Fleishman R., DO  

## 2016-08-24 NOTE — Patient Instructions (Addendum)
Carl Harrell , Thank you for taking time to come for your Medicare Wellness Visit. I appreciate your ongoing commitment to your health goals. Please review the following plan we discussed and let me know if I can assist you in the future.   Will order your abdomina Aortic aneurysm check;  You can have this at your convenience but someone will call you to schedule   These are the goals we discussed: Goals    . Exercise 150 minutes per week (moderate activity)          [facility]  [facility]; lives in [place];  Walk in the am; walk one mile - 5 days  [place] [facility] is one mile around     . Weight (lb) < 180 lb (81.6 kg)          Eating consistently  Using thyme or fennel when cooking green beans Use the white bulb of the fennel stem   Fat free or low fat dairy products Fish high in omega-3 acids ( salmon, tuna, trout) Fruits, such as apples, bananas, oranges, pears, prunes Legumes, such as kidney beans, lentils, checkpeas, black-eyed peas and lima beans Vegetables; broccoli, cabbage, carrots Whole grains;   Plant fats are better; decrease "white" foods as pasta, rice, bread and desserts, sugar; Avoid red meat (limiting) palm and coconut oils; sugary foods and beverages  Two nutrients that raise blood chol levels are saturated fats and trans fat; in hydrogenated oils and fats, as stick margarine, baked goods (cookes, cakes, pies, crackers; frosting; and coffee creamers;   Some Fats lower cholesterol: Monounsaturated and polyunsaturated  Avocados Corn, sunflower, and soybean oils Nuts and seeds, such as walnuts Olive, canola, peanut, safflower, and sesame oils Peanut butter Salmon and trout Tofu          This is a list of the screening recommended for you and due dates:  Health Maintenance  Topic Date Due  . Flu Shot  12/22/2016  . Colon Cancer Screening  05/25/2023  . Tetanus Vaccine  03/21/2024  .  Hepatitis C: One time screening is recommended by  Center for Disease Control  (CDC) for  adults born from [birth year] through 1967.   Completed  . Pneumonia vaccines  Completed   Prevention of falls: Remove rugs or any tripping hazards in the home Use Non slip mats in bathtubs and showers Placing grab bars next to the toilet and or shower Placing handrails on both sides of the stair way Adding extra lighting in the home.   Personal safety issues reviewed:  1. Consider starting a community watch program per [facility] 2.  Changes batteries is smoke detector and/or carbon monoxide detector  3.  If you have firearms; keep them in a safe place 4.  Wear protection when in the sun; Always wear sunscreen or a hat; It is good to have your doctor check your skin annually or review any new areas of concern 5. Driving safety; Keep in the right lane; stay 3 car lengths behind the car in front of you on the highway; look 3 times prior to pulling out; carry your cell phone everywhere you go!    Learn about the Yellow Dot program:  The program allows first responders at your emergency to have access to who your physician is, as well as your medications and medical conditions.  Citizens requesting the Yellow Dot Packages should contact Master Corporal Nunzio Cobbs at the [place] (  [phone] for the first week of the program and beginning the week after [place] citizens should contact their Scientist, physiological.      Check out  online nutrition programs as GumSearch.nl and http://vang.com/; fit72m; Look for foods with "whole" wheat; bran; oatmeal etc Shot at the farmer's markets in season for fresher choices  Watch for "hydrogenated" on the label of oils which are trans-fats.  Watch for "high fructose corn syrup" in snacks, yogurt or ketchup  Meats have less marbling; bright colored fruits and vegetables;  Canned; dump out liquid and wash vegetables. Be mindful of what we are eating  Portion control is  essential to a health weight! Sit down; take a break and enjoy your meal; take smaller bites; put the fork down between bites;  It takes 20 minutes to get full; so check in with your fullness cues and stop eating when you start to fill full         Fall Prevention in the Home Falls can cause injuries. They can happen to people of all ages. There are many things you can do to make your home safe and to help prevent falls. What can I do on the outside of my home?  Regularly fix the edges of walkways and driveways and fix any cracks.  Remove anything that might make you trip as you walk through a door, such as a raised step or threshold.  Trim any bushes or trees on the path to your home.  Use bright outdoor lighting.  Clear any walking paths of anything that might make someone trip, such as rocks or tools.  Regularly check to see if handrails are loose or broken. Make sure that both sides of any steps have handrails.  Any raised decks and porches should have guardrails on the edges.  Have any leaves, snow, or ice cleared regularly.  Use sand or salt on walking paths during winter.  Clean up any spills in your garage right away. This includes oil or grease spills. What can I do in the bathroom?  Use night lights.  Install grab bars by the toilet and in the tub and shower. Do not use towel bars as grab bars.  Use non-skid mats or decals in the tub or shower.  If you need to sit down in the shower, use a plastic, non-slip stool.  Keep the floor dry. Clean up any water that spills on the floor as soon as it happens.  Remove soap buildup in the tub or shower regularly.  Attach bath mats securely with double-sided non-slip rug tape.  Do not have throw rugs and other things on the floor that can make you trip. What can I do in the bedroom?  Use night lights.  Make sure that you have a light by your bed that is easy to reach.  Do not use any sheets or blankets that are  too big for your bed. They should not hang down onto the floor.  Have a firm chair that has side arms. You can use this for support while you get dressed.  Do not have throw rugs and other things on the floor that can make you trip. What can I do in the kitchen?  Clean up any spills right away.  Avoid walking on wet floors.  Keep items that you use a lot in easy-to-reach places.  If you need to reach something above you, use a strong step stool that has a grab bar.  Keep electrical  cords out of the way.  Do not use floor polish or wax that makes floors slippery. If you must use wax, use non-skid floor wax.  Do not have throw rugs and other things on the floor that can make you trip. What can I do with my stairs?  Do not leave any items on the stairs.  Make sure that there are handrails on both sides of the stairs and use them. Fix handrails that are broken or loose. Make sure that handrails are as long as the stairways.  Check any carpeting to make sure that it is firmly attached to the stairs. Fix any carpet that is loose or worn.  Avoid having throw rugs at the top or bottom of the stairs. If you do have throw rugs, attach them to the floor with carpet tape.  Make sure that you have a light switch at the top of the stairs and the bottom of the stairs. If you do not have them, ask someone to add them for you. What else can I do to help prevent falls?  Wear shoes that:  Do not have high heels.  Have rubber bottoms.  Are comfortable and fit you well.  Are closed at the toe. Do not wear sandals.  If you use a stepladder:  Make sure that it is fully opened. Do not climb a closed stepladder.  Make sure that both sides of the stepladder are locked into place.  Ask someone to hold it for you, if possible.  Clearly mark and make sure that you can see:  Any grab bars or handrails.  First and last steps.  Where the edge of each step is.  Use tools that help you move  around (mobility aids) if they are needed. These include:  Canes.  Walkers.  Scooters.  Crutches.  Turn on the lights when you go into a dark area. Replace any light bulbs as soon as they burn out.  Set up your furniture so you have a clear path. Avoid moving your furniture around.  If any of your floors are uneven, fix them.  If there are any pets around you, be aware of where they are.  Review your medicines with your doctor. Some medicines can make you feel dizzy. This can increase your chance of falling. Ask your doctor what other things that you can do to help prevent falls. This information is not intended to replace advice given to you by your health care provider. Make sure you discuss any questions you have with your health care provider. Document Released: 03/06/2009 Document Revised: 10/16/2015 Document Reviewed: 06/14/2014 Elsevier Interactive Patient Education  2017 [place] Maintenance, Male A healthy lifestyle and preventive care is important for your health and wellness. Ask your health care provider about what schedule of regular examinations is right for you. What should I know about weight and diet?  Eat a Healthy Diet  Eat plenty of vegetables, fruits, whole grains, low-fat dairy products, and lean protein.  Do not eat a lot of foods high in solid fats, added sugars, or salt. Maintain a Healthy Weight  Regular exercise can help you achieve or maintain a healthy weight. You should:  Do at least 150 minutes of exercise each week. The exercise should increase your heart rate and make you sweat (moderate-intensity exercise).  Do strength-training exercises at least twice a week. Watch Your Levels of Cholesterol and Blood Lipids  Have your blood tested for lipids and cholesterol every  5 years starting at 73 years of age. If you are at high risk for heart disease, you should start having your blood tested when you are 73 years old. You may need to  have your cholesterol levels checked more often if:  Your lipid or cholesterol levels are high.  You are older than 73 years of age.  You are at high risk for heart disease. What should I know about cancer screening? Many types of cancers can be detected early and may often be prevented. Lung Cancer  You should be screened every year for lung cancer if:  You are a current smoker who has smoked for at least 30 years.  You are a former smoker who has quit within the past 15 years.  Talk to your health care provider about your screening options, when you should start screening, and how often you should be screened. Colorectal Cancer  Routine colorectal cancer screening usually begins at 73 years of age and should be repeated every 5-10 years until you are 73 years old. You may need to be screened more often if early forms of precancerous polyps or small growths are found. Your health care provider may recommend screening at an earlier age if you have risk factors for colon cancer.  Your health care provider may recommend using home test kits to check for hidden blood in the stool.  A small camera at the end of a tube can be used to examine your colon (sigmoidoscopy or colonoscopy). This checks for the earliest forms of colorectal cancer. Prostate and Testicular Cancer  Depending on your age and overall health, your health care provider may do certain tests to screen for prostate and testicular cancer.  Talk to your health care provider about any symptoms or concerns you have about testicular or prostate cancer. Skin Cancer  Check your skin from head to toe regularly.  Tell your health care provider about any new moles or changes in moles, especially if:  There is a change in a mole's size, shape, or color.  You have a mole that is larger than a pencil eraser.  Always use sunscreen. Apply sunscreen liberally and repeat throughout the day.  Protect yourself by wearing long  sleeves, pants, a wide-brimmed hat, and sunglasses when outside. What should I know about heart disease, diabetes, and high blood pressure?  If you are 35-6 years of age, have your blood pressure checked every 3-5 years. If you are 70 years of age or older, have your blood pressure checked every year. You should have your blood pressure measured twice-once when you are at a hospital or clinic, and once when you are not at a hospital or clinic. Record the average of the two measurements. To check your blood pressure when you are not at a hospital or clinic, you can use:  An automated blood pressure machine at a pharmacy.  A home blood pressure monitor.  Talk to your health care provider about your target blood pressure.  If you are between 29-45 years old, ask your health care provider if you should take aspirin to prevent heart disease.  Have regular diabetes screenings by checking your fasting blood sugar level.  If you are at a normal weight and have a low risk for diabetes, have this test once every three years after the age of 97.  If you are overweight and have a high risk for diabetes, consider being tested at a younger age or more often.  A one-time screening  for abdominal aortic aneurysm (AAA) by ultrasound is recommended for men aged 34-75 years who are current or former smokers. What should I know about preventing infection? Hepatitis B  If you have a higher risk for hepatitis B, you should be screened for this virus. Talk with your health care provider to find out if you are at risk for hepatitis B infection. Hepatitis C  Blood testing is recommended for:  Everyone born from [birth year] through 1967.  Anyone with known risk factors for hepatitis C. Sexually Transmitted Diseases (STDs)  You should be screened each year for STDs including gonorrhea and chlamydia if:  You are sexually active and are younger than 73 years of age.  You are older than 72 years of age and your health  care provider tells you that you are at risk for this type of infection.  Your sexual activity has changed since you were last screened and you are at an increased risk for chlamydia or gonorrhea. Ask your health care provider if you are at risk.  Talk with your health care provider about whether you are at high risk of being infected with HIV. Your health care provider may recommend a prescription medicine to help prevent HIV infection. What else can I do?  Schedule regular health, dental, and eye exams.  Stay current with your vaccines (immunizations).  Do not use any tobacco products, such as cigarettes, chewing tobacco, and e-cigarettes. If you need help quitting, ask your health care provider.  Limit alcohol intake to no more than 2 drinks per day. One drink equals 12 ounces of beer, 5 ounces of wine, or 1 ounces of hard liquor.  Do not use street drugs.  Do not share needles.  Ask your health care provider for help if you need support or information about quitting drugs.  Tell your health care provider if you often feel depressed.  Tell your health care provider if you have ever been abused or do not feel safe at home. This information is not intended to replace advice given to you by your health care provider. Make sure you discuss any questions you have with your health care provider. Document Released: 11/06/2007 Document Revised: 01/07/2016 Document Reviewed: 02/11/2015 Elsevier Interactive Patient Education  2017 [employer].

## 2016-09-27 ENCOUNTER — Ambulatory Visit (HOSPITAL_COMMUNITY)
Admission: RE | Admit: 2016-09-27 | Discharge: 2016-09-27 | Disposition: A | Discharge status: Home / Self Care | Payer: Medicare HMO | Source: Ambulatory Visit | Attending: Cardiovascular Disease | Admitting: Cardiovascular Disease

## 2016-09-27 DIAGNOSIS — Z87891 Personal history of nicotine dependence: Secondary | ICD-10-CM | POA: Diagnosis not present

## 2016-10-22 NOTE — Progress Notes (Signed)
HPI:  Carl Harrell is a pleasant 71 y.o. here for follow up. Chronic medical problems summarized below were reviewed for changes and stability and were updated as needed below. These issues and their treatment remain stable for the most part. Deals with some nasal congestion and PND with allergies this time of year. Taking allegra. Staying active - reports some exercise and yard work. Diet "sucks."  Denies CP, SOB, DOE, treatment intolerance or new symptoms.  Due for PSA, hgba1c, bmp, cbc  CAD/HTN/HLD/Hx TIA, PAD: -LHC 05/2014 with DES to RCA and mod stenosis LAD -cardiologist is Dr. Radford Pax -meds: asa, plavix, ezetimibe, simvastatin, losartan-hctz 50-12.5, lopressor 25  Hyperglycemia/Obesity: -lifestyle recs advised  Intermittent Low back pain with sciatica: -meds: flexeril prn  Seasonal allergies: -meds: allegra  GERD: -meds: omeprazole 20mg   B12 def -takes Vit b12 daily  Mildly elevated PSA: -mild/stable elevation -reports chronic mild nocturia, unchanged -he declined to see a urologist for this and refused DRE   ROS: See pertinent positives and negatives per HPI.  Past Medical History:  Diagnosis Date  . ALLERGIC RHINITIS 01/30/2007  . Arthritis    "hands" (05/29/2014)  . Coronary artery disease    LHC (05/29/14): pLAD 50-70, pRCA 99 (L>R collats), EF 55% >> PCI: 3.5 x 28 mm Xience [employer] DES to [employer]  . Cough secondary to angiotensin converting enzyme inhibitor (ACE-I) 05/28/2013  . ED (erectile dysfunction)   . GERD 09/26/2007  . HYPERLIPIDEMIA 09/26/2007  . Hypertension   . PLANTAR FASCIITIS, BILATERAL 07/08/2009  . Unspecified hearing loss 06/16/2009   wears aides both ears    Past Surgical History:  Procedure Laterality Date  . CARDIAC CATHETERIZATION  05/29/2014   Procedure: CORONARY STENT INTERVENTION;  Surgeon: Sinclair Grooms, MD;  Location: [facility] CATH LAB;  Service: Cardiovascular;;  Prox RCA  . CORONARY ANGIOPLASTY WITH STENT PLACEMENT  05/29/2014   "1"   . KNEE ARTHROSCOPY Right ~ 2005  . LEFT HEART CATHETERIZATION WITH CORONARY ANGIOGRAM N/A 05/29/2014   Procedure: LEFT HEART CATHETERIZATION WITH CORONARY ANGIOGRAM;  Surgeon: Sinclair Grooms, MD;  Location: [facility] CATH LAB;  Service: Cardiovascular;  Laterality: N/A;    Family History  Problem Relation Age of Onset  . Heart attack Mother   . Heart disease Mother   . Heart attack Father   . Heart disease Father   . COPD Neg Hx        family hx  . Hyperlipidemia Neg Hx        family hx    Social History   Social History  . Marital status: Married    Spouse name: N/A  . Number of children: N/A  . Years of education: N/A   Social History Main Topics  . Smoking status: Former Smoker    Types: Cigarettes    Quit date: 05/24/1978  . Smokeless tobacco: Never Used     Comment: 05/29/2014 "smoked socially; nothing since 1980"  . Alcohol use 1.2 oz/week    2 Shots of liquor per week     Comment: beer or two a week  . Drug use: No  . Sexual activity: Not Currently   Other Topics Concern  . None   Social History Narrative   Lives with wife in a one story home.  Has 2 children.  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  Education: 3 years of college.    Does wood working.      Current Outpatient Prescriptions:  .  aspirin 81 MG chewable tablet, Chew  1 tablet (81 mg total) by mouth daily., Disp: , Rfl:  .  ezetimibe (ZETIA) 10 MG tablet, Take 1 tablet (10 mg total) by mouth daily., Disp: 90 tablet, Rfl: 3 .  fexofenadine (ALLEGRA) 180 MG tablet, Take 180 mg by mouth daily as needed for allergies or rhinitis., Disp: , Rfl:  .  losartan-hydrochlorothiazide (HYZAAR) 50-12.5 MG tablet, Take 1 tablet by mouth daily., Disp: 90 tablet, Rfl: 3 .  metoprolol tartrate (LOPRESSOR) 25 MG tablet, take 1 tablet by mouth twice a day, Disp: 180 tablet, Rfl: 5 .  Multiple Vitamin (MULTIVITAMIN WITH MINERALS) TABS tablet, Take 1 tablet by mouth daily., Disp: , Rfl:  .  NITROSTAT 0.4 MG SL tablet, PLACE 1 TABLET BY MOUTH  UNDER TONGUE EVERY 5 MINUTES AS NEEDED FOR CHEST PAIN, Disp: 25 tablet, Rfl: 12 .  omeprazole (PRILOSEC) 20 MG capsule, take 1 capsule by mouth twice a day, Disp: 100 capsule, Rfl: 4 .  simvastatin (ZOCOR) 40 MG tablet, take 1 tablet by mouth at bedtime, Disp: 90 tablet, Rfl: 3  EXAM:  Vitals:   10/25/16 0755  BP: 110/80  Pulse: 65  Temp: 98 F (36.7 C)    Body mass index is 32.47 kg/m.  GENERAL: vitals reviewed and listed above, alert, oriented, appears well hydrated and in no acute distress  HEENT: atraumatic, conjunttiva clear, no obvious abnormalities on inspection of external nose and ears  NECK: no obvious masses on inspection  LUNGS: clear to auscultation bilaterally, no wheezes, rales or rhonchi, good air movement  CV: HRRR, no peripheral edema  MS: moves all extremities without noticeable abnormality  PSYCH: pleasant and cooperative, no obvious depression or anxiety  ASSESSMENT AND PLAN:  Discussed the following assessment and plan:  Essential hypertension, benign - Plan: Basic metabolic panel, CBC  Hyperlipidemia LDL goal <70  Coronary artery disease involving native coronary artery of native heart without angina pectoris  BMI 32.0-32.9,adult  Hx-TIA (transient ischemic attack)  PAD (peripheral artery disease) (HCC)  Elevated PSA - Plan: PSA  Hyperglycemia - Plan: Hemoglobin A1c  -lifestyle recs -labs -follow up 3-4 months -Patient advised to return or notify a doctor immediately if symptoms worsen or persist or new concerns arise.  Patient Instructions  BEFORE YOU LEAVE: -follow up: 3-4 months -labs  We have ordered labs or studies at this visit. It can take up to 1-2 weeks for results and processing. IF results require follow up or explanation, we will call you with instructions. Clinically stable results will be released to your [facility]. If you have not heard from [place] or cannot find your results in [facility] in 2 weeks please contact our office  at [phone].  If you are not yet signed up for [facility], please consider signing up.  Advise regular aerobic exercise (at least 150 minutes per week of sweaty exercise) and a healthy diet. Try to eat at least 5-9 servings of vegetables and fruits per day (not corn, potatoes or bananas.) Avoid sweets, red meat, pork, butter, fried foods, fast food, processed food, excessive dairy, eggs and coconut. Replace bad fats with good fats - fish, nuts and seeds, canola oil, olive oil.   WE NOW OFFER   [facility] FAST TRACK!!!  SAME DAY Appointments for ACUTE CARE  Such as: Sprains, Injuries, cuts, abrasions, rashes, muscle pain, joint pain, back pain Colds, flu, sore throats, headache, allergies, cough, fever  Ear pain, sinus and eye infections Abdominal pain, nausea, vomiting, diarrhea, upset stomach Animal/insect bites  3 Easy Ways  to Schedule: Walk-In Scheduling Call in scheduling Mychart Sign-up: [email or web address]                  Colin Benton R., DO

## 2016-10-25 ENCOUNTER — Encounter: Payer: Self-pay | Admitting: Family Medicine

## 2016-10-25 ENCOUNTER — Ambulatory Visit (INDEPENDENT_AMBULATORY_CARE_PROVIDER_SITE_OTHER): Payer: Medicare HMO | Admitting: Family Medicine

## 2016-10-25 VITALS — BP 110/80 | HR 65 | Temp 98.0°F | Ht 67.0 in | Wt 207.3 lb

## 2016-10-25 DIAGNOSIS — R972 Elevated prostate specific antigen [PSA]: Secondary | ICD-10-CM | POA: Diagnosis not present

## 2016-10-25 DIAGNOSIS — E785 Hyperlipidemia, unspecified: Secondary | ICD-10-CM

## 2016-10-25 DIAGNOSIS — I739 Peripheral vascular disease, unspecified: Secondary | ICD-10-CM | POA: Diagnosis not present

## 2016-10-25 DIAGNOSIS — I10 Essential (primary) hypertension: Secondary | ICD-10-CM | POA: Diagnosis not present

## 2016-10-25 DIAGNOSIS — Z6832 Body mass index (BMI) 32.0-32.9, adult: Secondary | ICD-10-CM

## 2016-10-25 DIAGNOSIS — I2510 Atherosclerotic heart disease of native coronary artery without angina pectoris: Secondary | ICD-10-CM | POA: Diagnosis not present

## 2016-10-25 DIAGNOSIS — Z8673 Personal history of transient ischemic attack (TIA), and cerebral infarction without residual deficits: Secondary | ICD-10-CM

## 2016-10-25 DIAGNOSIS — R739 Hyperglycemia, unspecified: Secondary | ICD-10-CM

## 2016-10-25 LAB — CBC
HEMATOCRIT: 46.6 % (ref 39.0–52.0)
Hemoglobin: 15.1 g/dL (ref 13.0–17.0)
MCHC: 32.4 g/dL (ref 30.0–36.0)
MCV: 85.4 fl (ref 78.0–100.0)
Platelets: 273 10*3/uL (ref 150.0–400.0)
RBC: 5.46 Mil/uL (ref 4.22–5.81)
RDW: 14 % (ref 11.5–15.5)
WBC: 9.2 10*3/uL (ref 4.0–10.5)

## 2016-10-25 LAB — BASIC METABOLIC PANEL
BUN: 17 mg/dL (ref 6–23)
CALCIUM: 9.4 mg/dL (ref 8.4–10.5)
CHLORIDE: 105 meq/L (ref 96–112)
CO2: 29 mEq/L (ref 19–32)
CREATININE: 1.23 mg/dL (ref 0.40–1.50)
GFR: 61.25 mL/min (ref >60.00)
Glucose, Bld: 124 mg/dL — ABNORMAL HIGH (ref 70–99)
Potassium: 4.5 mEq/L (ref 3.5–5.1)
Sodium: 140 mEq/L (ref 135–145)

## 2016-10-25 LAB — HEMOGLOBIN A1C: HEMOGLOBIN A1C: 6.7 % — AB (ref 4.6–6.5)

## 2016-10-25 LAB — PSA: PSA: 7.38 ng/mL — AB (ref 0.10–4.00)

## 2016-10-25 NOTE — Patient Instructions (Signed)
BEFORE YOU LEAVE: -follow up: 3-4 months -labs  We have ordered labs or studies at this visit. It can take up to 1-2 weeks for results and processing. IF results require follow up or explanation, we will call you with instructions. Clinically stable results will be released to your [facility]. If you have not heard from [place] or cannot find your results in [facility] in 2 weeks please contact our office at [phone].  If you are not yet signed up for [facility], please consider signing up.  Advise regular aerobic exercise (at least 150 minutes per week of sweaty exercise) and a healthy diet. Try to eat at least 5-9 servings of vegetables and fruits per day (not corn, potatoes or bananas.) Avoid sweets, red meat, pork, butter, fried foods, fast food, processed food, excessive dairy, eggs and coconut. Replace bad fats with good fats - fish, nuts and seeds, canola oil, olive oil.   WE NOW OFFER   [facility] FAST TRACK!!!  SAME DAY Appointments for ACUTE CARE  Such as: Sprains, Injuries, cuts, abrasions, rashes, muscle pain, joint pain, back pain Colds, flu, sore throats, headache, allergies, cough, fever  Ear pain, sinus and eye infections Abdominal pain, nausea, vomiting, diarrhea, upset stomach Animal/insect bites  3 Easy Ways to Schedule: Walk-In Scheduling Call in scheduling Mychart Sign-up: [email or web address]

## 2016-10-26 NOTE — Addendum Note (Signed)
Addended by: Agnes Lawrence on: 10/26/2016 09:38 AM   Modules accepted: Orders

## 2016-11-10 NOTE — Progress Notes (Signed)
HPI:  Follow up new diagnosis diabetes: -in setting of obesity, CAD, HTN, HLD - several elevated glu levels and hgba1c 6.7 on recent check - diet - lots of potatoes, breads, bananas, pies and candy - he feels he can improve diet and seem more interested in this route rather the medications - has opthomologist - no polyuria, wounds on feet, increased fatigue  OA: -chronic -hands, knees -not much pain and responds well to tylenol when sore -has more stiffness in the morning -no weakness or numbness  See pertinent positives and negatives per HPI.  Past Medical History:  Diagnosis Date  . ALLERGIC RHINITIS 01/30/2007  . Arthritis    "hands" (05/29/2014)  . Coronary artery disease    LHC (05/29/14): pLAD 50-70, pRCA 99 (L>R collats), EF 55% >> PCI: 3.5 x 28 mm Xience Alpine DES to [employer]  . Cough secondary to angiotensin converting enzyme inhibitor (ACE-I) 05/28/2013  . ED (erectile dysfunction)   . GERD 09/26/2007  . HYPERLIPIDEMIA 09/26/2007  . Hypertension   . PLANTAR FASCIITIS, BILATERAL 07/08/2009  . Unspecified hearing loss 06/16/2009   wears aides both ears    Past Surgical History:  Procedure Laterality Date  . CARDIAC CATHETERIZATION  05/29/2014   Procedure: CORONARY STENT INTERVENTION;  Surgeon: Sinclair Grooms, MD;  Location: [facility] CATH LAB;  Service: Cardiovascular;;  Prox RCA  . CORONARY ANGIOPLASTY WITH STENT PLACEMENT  05/29/2014   "1"  . KNEE ARTHROSCOPY Right ~ 2005  . LEFT HEART CATHETERIZATION WITH CORONARY ANGIOGRAM N/A 05/29/2014   Procedure: LEFT HEART CATHETERIZATION WITH CORONARY ANGIOGRAM;  Surgeon: Sinclair Grooms, MD;  Location: [facility] CATH LAB;  Service: Cardiovascular;  Laterality: N/A;    Family History  Problem Relation Age of Onset  . Heart attack Mother   . Heart disease Mother   . Heart attack Father   . Heart disease Father   . COPD Neg Hx        family hx  . Hyperlipidemia Neg Hx        family hx    Social History   Social History  . Marital  status: Married    Spouse name: N/A  . Number of children: N/A  . Years of education: N/A   Social History Main Topics  . Smoking status: Former Smoker    Types: Cigarettes    Quit date: 05/24/1978  . Smokeless tobacco: Never Used     Comment: 05/29/2014 "smoked socially; nothing since 1980"  . Alcohol use 1.2 oz/week    2 Shots of liquor per week     Comment: beer or two a week  . Drug use: No  . Sexual activity: Not Currently   Other Topics Concern  . None   Social History Narrative   Lives with wife in a one story home.  Has 2 children.  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  Education: 3 years of college.    Does wood working.      Current Outpatient Prescriptions:  .  aspirin 81 MG chewable tablet, Chew 1 tablet (81 mg total) by mouth daily., Disp: , Rfl:  .  ezetimibe (ZETIA) 10 MG tablet, Take 1 tablet (10 mg total) by mouth daily., Disp: 90 tablet, Rfl: 3 .  fexofenadine (ALLEGRA) 180 MG tablet, Take 180 mg by mouth daily as needed for allergies or rhinitis., Disp: , Rfl:  .  losartan-hydrochlorothiazide (HYZAAR) 50-12.5 MG tablet, Take 1 tablet by mouth daily., Disp: 90 tablet, Rfl: 3 .  metoprolol tartrate (LOPRESSOR)  25 MG tablet, take 1 tablet by mouth twice a day, Disp: 180 tablet, Rfl: 5 .  Multiple Vitamin (MULTIVITAMIN WITH MINERALS) TABS tablet, Take 1 tablet by mouth daily., Disp: , Rfl:  .  NITROSTAT 0.4 MG SL tablet, PLACE 1 TABLET BY MOUTH UNDER TONGUE EVERY 5 MINUTES AS NEEDED FOR CHEST PAIN, Disp: 25 tablet, Rfl: 12 .  omeprazole (PRILOSEC) 20 MG capsule, take 1 capsule by mouth twice a day, Disp: 100 capsule, Rfl: 4 .  simvastatin (ZOCOR) 40 MG tablet, take 1 tablet by mouth at bedtime, Disp: 90 tablet, Rfl: 3 Dictation #1 [id number]  [id number]:[id number]   EXAM:  Vitals:   11/11/16 1018  BP: 138/72  Pulse: 70  Temp: 97.6 F (36.4 C)    Body mass index is 33.94 kg/m.  GENERAL: vitals reviewed and listed above, alert, oriented, appears well hydrated and in no acute  distress  HEENT: atraumatic, conjunttiva clear, no obvious abnormalities on inspection of external nose and ears  NECK: no obvious masses on inspection  LUNGS: clear to auscultation bilaterally, no wheezes, rales or rhonchi, good air movement  CV: HRRR, no peripheral edema  MS: moves all extremities without noticeable abnormality  PSYCH: pleasant and cooperative, no obvious depression or anxiety  ASSESSMENT AND PLAN:  Discussed the following assessment and plan:  Type 2 diabetes mellitus without complication, without long-term current use of insulin (HCC)  Hypertension associated with diabetes ([place])  Hyperlipidemia associated with type 2 diabetes mellitus (HCC)  BMI 33.0-33.9,adult  Osteoarthritis, unspecified osteoarthritis type, unspecified site  -stress importance of healthy diet and regular exercise - advised whole foods, avoidance of foods and drinks with added sugar, avoidance of high sugar foods -advised regular exercise -discussed implications, possible complications, progression diabetes -discussed treatment options for diabetes - he prefers to start with lifestyle changes -advised to schedule diabetic eye exam -discussed options for treatment OA - he wants to avoid injections, increased med -Patient advised to return or notify a doctor immediately if symptoms worsen or persist or new concerns arise.  Patient Instructions  BEFORE YOU LEAVE: -follow up in 3 months  FOR THE DIABETES: -eat a health low sugar diet -lean proteins (fish, [place] or chicken breasts, nuts and seeds, beans), lots of veggies, fresh berries if you like fruit, low fat dairy in moderation, 100% whole grains in small amounts -decrease: sugar, candy, sweets, breads, diet sweeteners, potatoes, pie -could try stevia in the tea -increase aerobic exercise gradually -get a diabetic eye exam  FOR the arthritis: -can try tumeric -can try topical capsaicin cream as needed     WE NOW OFFER    [facility] FAST TRACK!!!  SAME DAY Appointments for ACUTE CARE  Such as: Sprains, Injuries, cuts, abrasions, rashes, muscle pain, joint pain, back pain Colds, flu, sore throats, headache, allergies, cough, fever  Ear pain, sinus and eye infections Abdominal pain, nausea, vomiting, diarrhea, upset stomach Animal/insect bites  3 Easy Ways to Schedule: Walk-In Scheduling Call in scheduling Mychart Sign-up: [email or web address]           Colin Benton R., DO

## 2016-11-11 ENCOUNTER — Ambulatory Visit (INDEPENDENT_AMBULATORY_CARE_PROVIDER_SITE_OTHER): Payer: Medicare HMO | Admitting: Family Medicine

## 2016-11-11 ENCOUNTER — Encounter: Payer: Self-pay | Admitting: Family Medicine

## 2016-11-11 VITALS — BP 138/72 | HR 70 | Temp 97.6°F | Ht 67.0 in | Wt 216.7 lb

## 2016-11-11 DIAGNOSIS — E118 Type 2 diabetes mellitus with unspecified complications: Secondary | ICD-10-CM | POA: Insufficient documentation

## 2016-11-11 DIAGNOSIS — E1169 Type 2 diabetes mellitus with other specified complication: Secondary | ICD-10-CM | POA: Diagnosis not present

## 2016-11-11 DIAGNOSIS — Z6833 Body mass index (BMI) 33.0-33.9, adult: Secondary | ICD-10-CM | POA: Diagnosis not present

## 2016-11-11 DIAGNOSIS — E119 Type 2 diabetes mellitus without complications: Secondary | ICD-10-CM

## 2016-11-11 DIAGNOSIS — M1990 Unspecified osteoarthritis, unspecified site: Secondary | ICD-10-CM | POA: Diagnosis not present

## 2016-11-11 DIAGNOSIS — E785 Hyperlipidemia, unspecified: Secondary | ICD-10-CM

## 2016-11-11 DIAGNOSIS — E1159 Type 2 diabetes mellitus with other circulatory complications: Secondary | ICD-10-CM | POA: Diagnosis not present

## 2016-11-11 DIAGNOSIS — I10 Essential (primary) hypertension: Secondary | ICD-10-CM

## 2016-11-11 NOTE — Patient Instructions (Addendum)
BEFORE YOU LEAVE: -follow up in 3 months  FOR THE DIABETES: -eat a health low sugar diet -lean proteins (fish, [place] or chicken breasts, nuts and seeds, beans), lots of veggies, fresh berries if you like fruit, low fat dairy in moderation, 100% whole grains in small amounts -decrease: sugar, candy, sweets, breads, diet sweeteners, potatoes, pie -could try stevia in the tea -increase aerobic exercise gradually -get a diabetic eye exam  FOR the arthritis: -can try tumeric -can try topical capsaicin cream as needed     WE NOW OFFER    [facility] FAST TRACK!!!  SAME DAY Appointments for ACUTE CARE  Such as: Sprains, Injuries, cuts, abrasions, rashes, muscle pain, joint pain, back pain Colds, flu, sore throats, headache, allergies, cough, fever  Ear pain, sinus and eye infections Abdominal pain, nausea, vomiting, diarrhea, upset stomach Animal/insect bites  3 Easy Ways to Schedule: Walk-In Scheduling Call in scheduling Mychart Sign-up: [email or web address]

## 2016-12-16 ENCOUNTER — Telehealth: Payer: Self-pay

## 2016-12-16 NOTE — Telephone Encounter (Signed)
Received clearance request from [facility] (Dr. Michail Sermon) to hold Plavix for colonoscopy scheduled 02/11/17.  To Dr. Radford Pax to review.  [facility]:  Fax: [phone] Phone: [phone]

## 2016-12-16 NOTE — Telephone Encounter (Signed)
OK to hold Plavix for colonscopy

## 2016-12-20 NOTE — Telephone Encounter (Signed)
Clearance faxed to Dr. Kathline Magic Office. Confirmation received that fax was successful.

## 2016-12-23 DIAGNOSIS — R3911 Hesitancy of micturition: Secondary | ICD-10-CM | POA: Diagnosis not present

## 2016-12-23 DIAGNOSIS — R972 Elevated prostate specific antigen [PSA]: Secondary | ICD-10-CM | POA: Diagnosis not present

## 2016-12-23 DIAGNOSIS — N403 Nodular prostate with lower urinary tract symptoms: Secondary | ICD-10-CM | POA: Diagnosis not present

## 2017-01-21 DIAGNOSIS — R972 Elevated prostate specific antigen [PSA]: Secondary | ICD-10-CM | POA: Diagnosis not present

## 2017-02-01 DIAGNOSIS — C61 Malignant neoplasm of prostate: Secondary | ICD-10-CM | POA: Diagnosis not present

## 2017-02-01 DIAGNOSIS — N403 Nodular prostate with lower urinary tract symptoms: Secondary | ICD-10-CM | POA: Diagnosis not present

## 2017-02-02 ENCOUNTER — Encounter: Payer: Self-pay | Admitting: Radiation Oncology

## 2017-02-06 NOTE — Progress Notes (Signed)
HPI:  Carl Harrell is a pleasant 71 y.o. here for follow up. Chronic medical problems summarized below were reviewed for changes. He is undergoing workup for prostate cancer with his urologist. Notes reviewed. He will be seeing a radiation specialist next week to consider radiation. He prefers surgical treatment and is concerned that this was not offered. It seems per note, he CV co morbidities were considered in the recommendation for radiation vs surgical options. His friend had prostate cancer and got a second opinion that resulted in surgical treatment and did very well and pt feels he may need a second opionion regarding options. Denies CP, SOB, DOE, treatment intolerance or new symptoms. Due for foot exam, eye exam - scheduled per his report, flu shot, labs  Diabetes: -new diagnosis in 2018 and he preferred to treat with lifestyle changes -eye exam  CAD/HTN/HLD/Obesity: -sees cardiologist - Dr. Fransico Him -meds:asa, statin, zetia, losartan-hctz, metoprolol  GERD: -meds: prilosec  Prostate Ca: -seeing urology for management -referred to urologist after first CPE with me, prior PCP had been monitoring mildly elevated PSA levels  ROS: See pertinent positives and negatives per HPI.  Past Medical History:  Diagnosis Date  . ALLERGIC RHINITIS 01/30/2007  . Arthritis    "hands" (05/29/2014)  . Coronary artery disease    LHC (05/29/14): pLAD 50-70, pRCA 99 (L>R collats), EF 55% >> PCI: 3.5 x 28 mm Xience Alpine DES to [employer]  . Cough secondary to angiotensin converting enzyme inhibitor (ACE-I) 05/28/2013  . ED (erectile dysfunction)   . GERD 09/26/2007  . HYPERLIPIDEMIA 09/26/2007  . Hypertension   . PLANTAR FASCIITIS, BILATERAL 07/08/2009  . Unspecified hearing loss 06/16/2009   wears aides both ears    Past Surgical History:  Procedure Laterality Date  . CARDIAC CATHETERIZATION  05/29/2014   Procedure: CORONARY STENT INTERVENTION;  Surgeon: Sinclair Grooms, MD;  Location: [facility] CATH  LAB;  Service: Cardiovascular;;  Prox RCA  . CORONARY ANGIOPLASTY WITH STENT PLACEMENT  05/29/2014   "1"  . KNEE ARTHROSCOPY Right ~ 2005  . LEFT HEART CATHETERIZATION WITH CORONARY ANGIOGRAM N/A 05/29/2014   Procedure: LEFT HEART CATHETERIZATION WITH CORONARY ANGIOGRAM;  Surgeon: Sinclair Grooms, MD;  Location: [facility] CATH LAB;  Service: Cardiovascular;  Laterality: N/A;    Family History  Problem Relation Age of Onset  . Heart attack Mother   . Heart disease Mother   . Heart attack Father   . Heart disease Father   . COPD Neg Hx        family hx  . Hyperlipidemia Neg Hx        family hx    Social History   Social History  . Marital status: Married    Spouse name: N/A  . Number of children: N/A  . Years of education: N/A   Social History Main Topics  . Smoking status: Former Smoker    Types: Cigarettes    Quit date: 05/24/1978  . Smokeless tobacco: Never Used     Comment: 05/29/2014 "smoked socially; nothing since 1980"  . Alcohol use 1.2 oz/week    2 Shots of liquor per week     Comment: beer or two a week  . Drug use: No  . Sexual activity: Not Currently   Other Topics Concern  . None   Social History Narrative   Lives with wife in a one story home.  Has 2 children.  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  Education: 3 years of college.  Does wood working.      Current Outpatient Prescriptions:  .  aspirin 81 MG chewable tablet, Chew 1 tablet (81 mg total) by mouth daily., Disp: , Rfl:  .  clopidogrel (PLAVIX) 75 MG tablet, , Disp: , Rfl:  .  ezetimibe (ZETIA) 10 MG tablet, Take 1 tablet (10 mg total) by mouth daily., Disp: 90 tablet, Rfl: 3 .  fexofenadine (ALLEGRA) 180 MG tablet, Take 180 mg by mouth daily as needed for allergies or rhinitis., Disp: , Rfl:  .  losartan-hydrochlorothiazide (HYZAAR) 50-12.5 MG tablet, Take 1 tablet by mouth daily., Disp: 90 tablet, Rfl: 3 .  metoprolol tartrate (LOPRESSOR) 25 MG tablet, take 1 tablet by mouth twice a day, Disp: 180 tablet, Rfl: 5 .   Multiple Vitamin (MULTIVITAMIN WITH MINERALS) TABS tablet, Take 1 tablet by mouth daily., Disp: , Rfl:  .  NITROSTAT 0.4 MG SL tablet, PLACE 1 TABLET BY MOUTH UNDER TONGUE EVERY 5 MINUTES AS NEEDED FOR CHEST PAIN, Disp: 25 tablet, Rfl: 12 .  omeprazole (PRILOSEC) 20 MG capsule, take 1 capsule by mouth twice a day, Disp: 100 capsule, Rfl: 4 .  simvastatin (ZOCOR) 40 MG tablet, take 1 tablet by mouth at bedtime, Disp: 90 tablet, Rfl: 3  EXAM:  Vitals:   02/07/17 0853  BP: 102/80  Pulse: 69  Temp: 97.7 F (36.5 C)    Body mass index is 33 kg/m.  GENERAL: vitals reviewed and listed above, alert, oriented, appears well hydrated and in no acute distress  HEENT: atraumatic, conjunttiva clear, no obvious abnormalities on inspection of external nose and ears  NECK: no obvious masses on inspection  LUNGS: clear to auscultation bilaterally, no wheezes, rales or rhonchi, good air movement  CV: HRRR, no peripheral edema  MS: moves all extremities without noticeable abnormality  PSYCH: pleasant and cooperative, no obvious depression or anxiety  ASSESSMENT AND PLAN:  Discussed the following assessment and plan:  Hyperlipidemia associated with type 2 diabetes mellitus ([place]) Hypertension associated with diabetes ([place]) - Plan: Basic metabolic panel, CBC Type 2 diabetes mellitus without complication, without long-term current use of insulin ([place]) - Plan: Hemoglobin A1c Coronary artery disease involving native coronary artery of native heart without angina pectoris -labs -lifestyle recs -cont current meds  Prostate cancer ([place]) -he is going to see radiation specialist and also consider a 2nd opinion - I don't think [place] would mind if he discussed risks/benefits surgical treatment in his situation, perhaps with [place].  -Patient advised to return or notify a doctor immediately if symptoms worsen or persist or new concerns arise.  Patient Instructions  BEFORE YOU LEAVE: -follow up:  3-4 months -labs  Get you flu shot in the next 1-2 months  Get your diabetic eye exam  Will be thinking about you with the radiation appointment - please let me know if I can help.  We have ordered labs or studies at this visit. It can take up to 1-2 weeks for results and processing. IF results require follow up or explanation, we will call you with instructions. Clinically stable results will be released to your [facility]. If you have not heard from [place] or cannot find your results in [facility] in 2 weeks please contact our office at [phone].  If you are not yet signed up for [facility], please consider signing up.  Advise regular aerobic exercise (at least 150 minutes per week of sweaty exercise) and a healthy diet. Try to eat at least 5-9 servings of vegetables and fruits per  day (not corn, potatoes or bananas.) Avoid sweets, red meat, pork, butter, fried foods, fast food, processed food, excessive dairy, eggs and coconut. Replace bad fats with good fats - fish, nuts and seeds, canola oil, olive oil.          Colin Benton R., DO

## 2017-02-07 ENCOUNTER — Encounter: Payer: Self-pay | Admitting: Family Medicine

## 2017-02-07 ENCOUNTER — Ambulatory Visit (INDEPENDENT_AMBULATORY_CARE_PROVIDER_SITE_OTHER): Payer: Medicare HMO | Admitting: Family Medicine

## 2017-02-07 VITALS — BP 102/80 | HR 69 | Temp 97.7°F | Ht 67.0 in | Wt 210.7 lb

## 2017-02-07 DIAGNOSIS — I2510 Atherosclerotic heart disease of native coronary artery without angina pectoris: Secondary | ICD-10-CM

## 2017-02-07 DIAGNOSIS — E785 Hyperlipidemia, unspecified: Secondary | ICD-10-CM

## 2017-02-07 DIAGNOSIS — C61 Malignant neoplasm of prostate: Secondary | ICD-10-CM

## 2017-02-07 DIAGNOSIS — E119 Type 2 diabetes mellitus without complications: Secondary | ICD-10-CM

## 2017-02-07 DIAGNOSIS — E1159 Type 2 diabetes mellitus with other circulatory complications: Secondary | ICD-10-CM

## 2017-02-07 DIAGNOSIS — I10 Essential (primary) hypertension: Secondary | ICD-10-CM | POA: Diagnosis not present

## 2017-02-07 DIAGNOSIS — I152 Hypertension secondary to endocrine disorders: Secondary | ICD-10-CM

## 2017-02-07 DIAGNOSIS — E1169 Type 2 diabetes mellitus with other specified complication: Secondary | ICD-10-CM

## 2017-02-07 NOTE — Patient Instructions (Signed)
BEFORE YOU LEAVE: -follow up: 3-4 months -labs  Get you flu shot in the next 1-2 months  Get your diabetic eye exam  Will be thinking about you with the radiation appointment - please let me know if I can help.  We have ordered labs or studies at this visit. It can take up to 1-2 weeks for results and processing. IF results require follow up or explanation, we will call you with instructions. Clinically stable results will be released to your [facility]. If you have not heard from [place] or cannot find your results in [facility] in 2 weeks please contact our office at [phone].  If you are not yet signed up for [facility], please consider signing up.  Advise regular aerobic exercise (at least 150 minutes per week of sweaty exercise) and a healthy diet. Try to eat at least 5-9 servings of vegetables and fruits per day (not corn, potatoes or bananas.) Avoid sweets, red meat, pork, butter, fried foods, fast food, processed food, excessive dairy, eggs and coconut. Replace bad fats with good fats - fish, nuts and seeds, canola oil, olive oil.

## 2017-02-10 ENCOUNTER — Encounter: Payer: Self-pay | Admitting: Family Medicine

## 2017-02-11 ENCOUNTER — Ambulatory Visit: Payer: Medicare HMO | Admitting: Family Medicine

## 2017-02-11 DIAGNOSIS — K648 Other hemorrhoids: Secondary | ICD-10-CM | POA: Diagnosis not present

## 2017-02-11 DIAGNOSIS — K5520 Angiodysplasia of colon without hemorrhage: Secondary | ICD-10-CM | POA: Diagnosis not present

## 2017-02-11 DIAGNOSIS — Z86010 Personal history of colonic polyps: Secondary | ICD-10-CM | POA: Diagnosis not present

## 2017-02-11 DIAGNOSIS — D126 Benign neoplasm of colon, unspecified: Secondary | ICD-10-CM | POA: Diagnosis not present

## 2017-02-11 DIAGNOSIS — K5730 Diverticulosis of large intestine without perforation or abscess without bleeding: Secondary | ICD-10-CM | POA: Diagnosis not present

## 2017-02-14 ENCOUNTER — Other Ambulatory Visit (INDEPENDENT_AMBULATORY_CARE_PROVIDER_SITE_OTHER): Payer: Medicare HMO

## 2017-02-14 DIAGNOSIS — E119 Type 2 diabetes mellitus without complications: Secondary | ICD-10-CM | POA: Diagnosis not present

## 2017-02-14 DIAGNOSIS — E1159 Type 2 diabetes mellitus with other circulatory complications: Secondary | ICD-10-CM | POA: Diagnosis not present

## 2017-02-14 DIAGNOSIS — I10 Essential (primary) hypertension: Secondary | ICD-10-CM

## 2017-02-14 LAB — CBC
HEMATOCRIT: 45.7 % (ref 39.0–52.0)
Hemoglobin: 14.6 g/dL (ref 13.0–17.0)
MCHC: 32 g/dL (ref 30.0–36.0)
MCV: 86.4 fl (ref 78.0–100.0)
PLATELETS: 253 10*3/uL (ref 150.0–400.0)
RBC: 5.29 Mil/uL (ref 4.22–5.81)
RDW: 14.3 % (ref 11.5–15.5)
WBC: 7.9 10*3/uL (ref 4.0–10.5)

## 2017-02-14 LAB — BASIC METABOLIC PANEL
BUN: 13 mg/dL (ref 6–23)
CALCIUM: 9.1 mg/dL (ref 8.4–10.5)
CO2: 28 meq/L (ref 19–32)
CREATININE: 1.13 mg/dL (ref 0.40–1.50)
Chloride: 104 mEq/L (ref 96–112)
GFR: 67.49 mL/min (ref >60.00)
GLUCOSE: 127 mg/dL — AB (ref 70–99)
Potassium: 4.2 mEq/L (ref 3.5–5.1)
Sodium: 143 mEq/L (ref 135–145)

## 2017-02-14 LAB — HEMOGLOBIN A1C: HEMOGLOBIN A1C: 6.4 % (ref 4.6–6.5)

## 2017-02-14 NOTE — Progress Notes (Signed)
Subjective:   Carl Harrell is a 71 y.o. male who presents for Medicare Annual (Subsequent) preventive examination. The Patient was informed that the wellness visit is to identify future health risk and educate and initiate measures that can reduce risk for increased disease through the lifespan.    Annual Wellness Assessment  Reports health as fair   Preventive Screening -Counseling & Management  Medicare Annual Preventive Care Visit - Subsequent Last OV 01/2016 Undergoing a work up for prostate cancer  This past week had colonoscopy and bx Tomorrow a consultation with Dr. Tammi Klippel Eye exam tomorrow at [facility]   [facility] completed -Wilford Corner MD  Sees [facility] for UR  Dx Prostatic Adenocarcinoma / Dr. Karsten Ro   Health Maintenance Due  Topic Date Due  . OPHTHALMOLOGY EXAM  06/30/1955   Dr. Maudie Mercury checked this last week  Agrees to foot exam today- no issues  Friend is a pharmacist who bowls with him and he will him his flu vaccine later this year;   Smoking history - Former smoker Quit 57; 71 yo Never smoked for habit  AAA completed due to smoking hx   Second Hand Smoke status; No Smokers in the home ETOH beer or 2 a week  VS reviewed;   Diet  Sleep scheduled challenging due to work hx Gets up at [employer] and goes to bed at 5am  Sleep until 7;30 and get up for day Has 2 meals a day  Wife has sojourn's;   BMI 32  Exercise Bowling 3 times a week [facility]  Adjusting for weight gain  Discussed the difference a 10 lbs weight loss would make, but first to eat well with upcoming treatments   Stressors: recent dx   Sleep patterns: irregular  Pain c/o of left knee locking up but has been for a couple of year     Cardiac Risk Factors Addressed Hyperlipidemia - ratio 3.4 Diabetes new 2018; treating with lifestyle changes  A1c 6.4 states he has cut down on the candy bars     Advanced Directives completed   Patient Care Team: Lucretia Kern, DO as PCP - General (Family Medicine) Sueanne Margarita, MD as Consulting Physician (Cardiology)    Cardiac Risk Factors include: advanced age (>55men, >66 women);diabetes mellitus;dyslipidemia;family history of premature cardiovascular disease;hypertension;male gender     Objective:     Vitals: BP 120/70   Pulse 92   Ht 5' 7.5" (1.715 m)   Wt 210 lb (95.3 kg)   SpO2 97%   BMI 32.41 kg/m   Body mass index is 32.41 kg/m.   Tobacco History  Smoking Status  . Former Smoker  . Types: Cigarettes  . Quit date: 05/24/1978  Smokeless Tobacco  . Never Used    Comment: 05/29/2014 "smoked socially; nothing since 1980"     Counseling given: Yes   Past Medical History:  Diagnosis Date  . ALLERGIC RHINITIS 01/30/2007  . Arthritis    "hands" (05/29/2014)  . Coronary artery disease    LHC (05/29/14): pLAD 50-70, pRCA 99 (L>R collats), EF 55% >> PCI: 3.5 x 28 mm Xience Alpine DES to [employer]  . Cough secondary to angiotensin converting enzyme inhibitor (ACE-I) 05/28/2013  . ED (erectile dysfunction)   . GERD 09/26/2007  . HYPERLIPIDEMIA 09/26/2007  . Hypertension   . PLANTAR FASCIITIS, BILATERAL 07/08/2009  . Unspecified hearing loss 06/16/2009   wears aides both ears   Past Surgical History:  Procedure Laterality  Date  . CARDIAC CATHETERIZATION  05/29/2014   Procedure: CORONARY STENT INTERVENTION;  Surgeon: Sinclair Grooms, MD;  Location[facility] CATH LAB;  Service: Cardiovascular;;  Prox RCA  . CORONARY ANGIOPLASTY WITH STENT PLACEMENT  05/29/2014   "1"  . KNEE ARTHROSCOPY Right ~ 2005  . LEFT HEART CATHETERIZATION WITH CORONARY ANGIOGRAM N/A 05/29/2014   Procedure: LEFT HEART CATHETERIZATION WITH CORONARY ANGIOGRAM;  Surgeon: Sinclair Grooms, MD;  Location: [facility] CATH LAB;  Service: Cardiovascular;  Laterality: N/A;   Family History  Problem Relation Age of Onset  . Heart attack Mother   . Heart disease Mother   . Heart attack Father   . Heart disease Father   . COPD Neg Hx         family hx  . Hyperlipidemia Neg Hx        family hx   History  Sexual Activity  . Sexual activity: Not Currently    Outpatient Encounter Prescriptions as of 02/15/2017  Medication Sig  . aspirin 81 MG chewable tablet Chew 1 tablet (81 mg total) by mouth daily.  . clopidogrel (PLAVIX) 75 MG tablet   . ezetimibe (ZETIA) 10 MG tablet Take 1 tablet (10 mg total) by mouth daily.  . fexofenadine (ALLEGRA) 180 MG tablet Take 180 mg by mouth daily as needed for allergies or rhinitis.  Marland Kitchen losartan-hydrochlorothiazide (HYZAAR) 50-12.5 MG tablet Take 1 tablet by mouth daily.  . metoprolol tartrate (LOPRESSOR) 25 MG tablet take 1 tablet by mouth twice a day  . Multiple Vitamin (MULTIVITAMIN WITH MINERALS) TABS tablet Take 1 tablet by mouth daily.  Marland Kitchen NITROSTAT 0.4 MG SL tablet PLACE 1 TABLET BY MOUTH UNDER TONGUE EVERY 5 MINUTES AS NEEDED FOR CHEST PAIN  . omeprazole (PRILOSEC) 20 MG capsule take 1 capsule by mouth twice a day  . simvastatin (ZOCOR) 40 MG tablet take 1 tablet by mouth at bedtime  . TAMSULOSIN HCL PO Take by mouth.   No facility-administered encounter medications on file as of 02/15/2017.     Activities of Daily Living In your present state of health, do you have any difficulty performing the following activities: 02/15/2017 08/24/2016  Hearing? N Y  Vision? N N  Difficulty concentrating or making decisions? N N  Walking or climbing stairs? N N  Dressing or bathing? N N  Doing errands, shopping? N N  Preparing Food and eating ? N N  Using the Toilet? N N  In the past six months, have you accidently leaked urine? Y N  Comment followed up with urology  -  Do you have problems with loss of bowel control? N N  Managing your Medications? N N  Managing your Finances? N N  Housekeeping or managing your Housekeeping? N N  Some recent data might be hidden    Patient Care Team: Lucretia Kern, DO as PCP - General (Family Medicine) Sueanne Margarita, MD as Consulting Physician  (Cardiology)    Assessment:   Exercise Activities and Dietary recommendations Current Exercise Habits: Home exercise routine, Type of exercise: walking;Other - see comments (bowling), Intensity: Moderate  Goals    . Exercise 150 minutes per week (moderate activity)          [facility]  Longer walks; lives in [place];  Walk in the am; walk one mile - 5 days  [place] one mile around     . patient  Raise bowling average -188  Goal is 190      . Weight (lb) < 180 lb (81.6 kg)          Eating consistently  Using thyme or fennel when cooking green beans Use the white bulb of the fennel stem   Fat free or low fat dairy products Fish high in omega-3 acids ( salmon, tuna, trout) Fruits, such as apples, bananas, oranges, pears, prunes Legumes, such as kidney beans, lentils, checkpeas, black-eyed peas and lima beans Vegetables; broccoli, cabbage, carrots Whole grains;   Plant fats are better; decrease "white" foods as pasta, rice, bread and desserts, sugar; Avoid red meat (limiting) palm and coconut oils; sugary foods and beverages  Two nutrients that raise blood chol levels are saturated fats and trans fat; in hydrogenated oils and fats, as stick margarine, baked goods (cookes, cakes, pies, crackers; frosting; and coffee creamers;   Some Fats lower cholesterol: Monounsaturated and polyunsaturated  Avocados Corn, sunflower, and soybean oils Nuts and seeds, such as walnuts Olive, canola, peanut, safflower, and sesame oils Peanut butter Salmon and trout Tofu         Fall Risk Fall Risk  02/15/2017 08/24/2016 04/23/2016 06/25/2013  Falls in the past year? No No Yes No  Number falls in past yr: - - 1 -  Follow up - - Education provided -   Depression Screen PHQ 2/9 Scores 02/15/2017 08/24/2016 04/23/2016 07/10/2014  PHQ - 2 Score 0 0 0 0     Cognitive Function MMSE - Mini Mental State Exam 08/24/2016  Not completed: (No Data)     Ad8 score  reviewed for issues:  Issues making decisions:  Less interest in hobbies / activities:  Repeats questions, stories (family complaining):  Trouble using ordinary gadgets (microwave, computer, phone):  Forgets the month or year:   Mismanaging finances:   Remembering appts:  Daily problems with thinking and/or memory: Ad8 score is=0     6CIT Screen 04/23/2016  What Year? 0 points  What month? 0 points  What time? 0 points  Count back from 20 0 points  Months in reverse 0 points  Repeat phrase 0 points  Total Score 0    Immunization History  Administered Date(s) Administered  . Influenza Split 03/03/2011, 02/01/2012, 04/27/2013  . Influenza Whole 05/24/2004  . Influenza, High Dose Seasonal PF 01/16/2016  . Influenza,inj,Quad PF,6+ Mos 02/22/2013, 03/13/2014  . Influenza-Unspecified 03/07/2015  . Pneumococcal Conjugate-13 03/21/2014  . Pneumococcal Polysaccharide-23 10/03/2008  . Td 05/24/2002  . Tetanus 03/21/2014   Screening Tests Health Maintenance  Topic Date Due  . OPHTHALMOLOGY EXAM  06/30/1955  . INFLUENZA VACCINE  03/23/2017 (Originally 12/22/2016)  . FOOT EXAM  03/23/2017 (Originally 06/30/1955)  . HEMOGLOBIN A1C  08/14/2017  . COLONOSCOPY  05/25/2023  . TETANUS/TDAP  03/21/2024  . Hepatitis C Screening  Completed  . PNA vac Low Risk Adult  Completed      Plan:      PCP Notes   Health Maintenance Eye exam tomorrow Foot exam today  Declines flu vaccine as friend is a pharmacist and always given him his Recent colonoscopy as work up to prostate cancer  Had AD at home from last year but had a question; answered his questions and also gave him a resource for the [facility] pastoral department    Abnormal Screens  To see the oncology radiologist tomorrow  Would have preferred surgery but not an option for him States his prognosis is good   Referrals  None  today   Patient concerns; Did mention his left knee "locks" up when walking, he does not know  why; has been this way for about 2 years. Did not discuss with Dr. Maudie Mercury; suggested fup as needed  Nurse Concerns; As noted   Next PCP apt - 1/17/ 2019      I have personally reviewed and noted the following in the patient's chart:   . Medical and social history . Use of alcohol, tobacco or illicit drugs  . Current medications and supplements . Functional ability and status . Nutritional status . Physical activity . Advanced directives . List of other physicians . Hospitalizations, surgeries, and ER visits in previous 12 months . Vitals . Screenings to include cognitive, depression, and falls . Referrals and appointments  In addition, I have reviewed and discussed with patient certain preventive protocols, quality metrics, and best practice recommendations. A written personalized care plan for preventive services as well as general preventive health recommendations were provided to patient.     Wynetta Fines, RN  02/15/2017

## 2017-02-15 ENCOUNTER — Ambulatory Visit: Payer: Medicare HMO

## 2017-02-15 VITALS — BP 120/70 | HR 92 | Ht 67.5 in | Wt 210.0 lb

## 2017-02-15 DIAGNOSIS — Z0000 Encounter for general adult medical examination without abnormal findings: Secondary | ICD-10-CM

## 2017-02-15 NOTE — Progress Notes (Signed)
GU Location of Tumor / Histology: Prostatic Adenocarcinoma   If Prostate Cancer, Gleason Score is (4  +3 ) and PSA is (7.38)  Carl Harrell presented months ago with signs/symptoms of: Elevated PSA levels  Biopsies of (if applicable) revealed:    Past/Anticipated interventions by urology, if any:   Past/Anticipated interventions by medical oncology, if any:   Weight changes, if any: Gain thirty pounds since retirement.  Bowel/Bladder complaints, if any:  Patient states that the only issues he is having with his bladder is a weak stream.Denies  Any burning or bleeding with urination.Denies any issues with his bowels.Denies any urgency with urination.  Nausea/Vomiting, if any: None  Pain issues, if any:  Denies any pain.  SAFETY ISSUES:  Prior radiation?  No. Patient states that he does have a stent in his heart  Pacemaker/I No   Possible current pregnancy? No  Is the patient on methotrexate?  No  Current Complaints / other details: 02/14/2017   Bun 13 Creatinine  1.13 Wt Readings from Last 3 Encounters:  02/16/17 208 lb 8 oz (94.6 kg)  02/15/17 210 lb (95.3 kg)  02/07/17 210 lb 11.2 oz (95.6 kg)   Vitals:   02/16/17 0842  BP: 126/79  Pulse: 66  Resp: 20  Temp: 98.3 F (36.8 C)  TempSrc: Oral  SpO2: 96%  Weight: 208 lb 8 oz (94.6 kg)

## 2017-02-15 NOTE — Patient Instructions (Addendum)
Carl Harrell , Thank you for taking time to come for your Medicare Wellness Visit. I appreciate your ongoing commitment to your health goals. Please review the following plan we discussed and let me know if I can assist you in the future.   Will try to complete AD; Given copy  Referred to [facility] for questions [place] offers free advance directive forms, as well as assistance in completing the forms themselves. For assistance, contact the Spiritual Care Department at [phone], or the Clinical Social Work Department at [phone].  Deaf & Hard of Hearing Division Services - can assist with hearing aid x 1  No reviews  [facility]  [place] [phone]  [phone] - [place]   Shingrix is a vaccine for the prevention of Shingles in Adults 66 and older.  If you are on Medicare, you can request a prescription from your doctor to be filled at a pharmacy.  Please check with your benefits regarding applicable copays or out of pocket expenses.  The Shingrix is given in 2 vaccines approx 8 weeks apart. You must receive the 2nd dose prior to 6 months from receipt of the first.   Keep in mind the flu shot is an inactivated vaccine and takes at least 2 weeks to build immunity. The flu virus can be dormant for 4 days prior to symptoms Taking the flu shot at the beginning of the season can reduce the risk for the entire community.        These are the goals we discussed: Goals    . Exercise 150 minutes per week (moderate activity)          [facility]  [facility]; lives in [place];  Walk in the am; walk one mile - 5 days  [place] [facility] [place] is one mile around     . patient           Raise bowling average -188  Goal is 190      . Weight (lb) < 180 lb (81.6 kg)          Eating consistently  Using thyme or fennel when cooking green beans Use the white bulb of the fennel stem   Fat free or low fat dairy products Fish high in omega-3 acids (  salmon, tuna, trout) Fruits, such as apples, bananas, oranges, pears, prunes Legumes, such as kidney beans, lentils, checkpeas, black-eyed peas and lima beans Vegetables; broccoli, cabbage, carrots Whole grains;   Plant fats are better; decrease "white" foods as pasta, rice, bread and desserts, sugar; Avoid red meat (limiting) palm and coconut oils; sugary foods and beverages  Two nutrients that raise blood chol levels are saturated fats and trans fat; in hydrogenated oils and fats, as stick margarine, baked goods (cookes, cakes, pies, crackers; frosting; and coffee creamers;   Some Fats lower cholesterol: Monounsaturated and polyunsaturated  Avocados Corn, sunflower, and soybean oils Nuts and seeds, such as walnuts Olive, canola, peanut, safflower, and sesame oils Peanut butter Salmon and trout Tofu          This is a list of the screening recommended for you and due dates:  Health Maintenance  Topic Date Due  . Eye exam for diabetics  06/30/1955  . Flu Shot  03/23/2017*  . Complete foot exam   03/23/2017*  . Hemoglobin A1C  08/14/2017  . Colon Cancer Screening  05/25/2023  . Tetanus Vaccine  03/21/2024  .  Hepatitis C:  One time screening is recommended by Center for Disease Control  (CDC) for  adults born from [birth year] through 1967.   Completed  . Pneumonia vaccines  Completed  *Topic was postponed. The date shown is not the original due date.        Diabetes and Foot Care Diabetes may cause you to have problems because of poor blood supply (circulation) to your feet and legs. This may cause the skin on your feet to become thinner, break easier, and heal more slowly. Your skin may become dry, and the skin may peel and crack. You may also have nerve damage in your legs and feet causing decreased feeling in them. You may not notice minor injuries to your feet that could lead to infections or more serious problems. Taking care of your feet is one of the most important things you can  do for yourself. Follow these instructions at home:  Wear shoes at all times, even in the house. Do not go barefoot. Bare feet are easily injured.  Check your feet daily for blisters, cuts, and redness. If you cannot see the bottom of your feet, use a mirror or ask someone for help.  Wash your feet with warm water (do not use hot water) and mild soap. Then pat your feet and the areas between your toes until they are completely dry. Do not soak your feet as this can dry your skin.  Apply a moisturizing lotion or petroleum jelly (that does not contain alcohol and is unscented) to the skin on your feet and to dry, brittle toenails. Do not apply lotion between your toes.  Trim your toenails straight across. Do not dig under them or around the cuticle. File the edges of your nails with an emery board or nail file.  Do not cut corns or calluses or try to remove them with medicine.  Wear clean socks or stockings every day. Make sure they are not too tight. Do not wear knee-high stockings since they may decrease blood flow to your legs.  Wear shoes that fit properly and have enough cushioning. To break in new shoes, wear them for just a few hours a day. This prevents you from injuring your feet. Always look in your shoes before you put them on to be sure there are no objects inside.  Do not cross your legs. This may decrease the blood flow to your feet.  If you find a minor scrape, cut, or break in the skin on your feet, keep it and the skin around it clean and dry. These areas may be cleansed with mild soap and water. Do not cleanse the area with peroxide, alcohol, or iodine.  When you remove an adhesive bandage, be sure not to damage the skin around it.  If you have a wound, look at it several times a day to make sure it is healing.  Do not use heating pads or hot water bottles. They may burn your skin. If you have lost feeling in your feet or legs, you may not know it is happening until it is  too late.  Make sure your health care provider performs a complete foot exam at least annually or more often if you have foot problems. Report any cuts, sores, or bruises to your health care provider immediately. Contact a health care provider if:  You have an injury that is not healing.  You have cuts or breaks in the skin.  You have an ingrown nail.  You notice  redness on your legs or feet.  You feel burning or tingling in your legs or feet.  You have pain or cramps in your legs and feet.  Your legs or feet are numb.  Your feet always feel cold. Get help right away if:  There is increasing redness, swelling, or pain in or around a wound.  There is a red line that goes up your leg.  Pus is coming from a wound.  You develop a fever or as directed by your health care provider.  You notice a bad smell coming from an ulcer or wound. This information is not intended to replace advice given to you by your health care provider. Make sure you discuss any questions you have with your health care provider. Document Released: 05/07/2000 Document Revised: 10/16/2015 Document Reviewed: 10/17/2012 Elsevier Interactive Patient Education  2017 [place] Prevention in the Home Falls can cause injuries. They can happen to people of all ages. There are many things you can do to make your home safe and to help prevent falls. What can I do on the outside of my home?  Regularly fix the edges of walkways and driveways and fix any cracks.  Remove anything that might make you trip as you walk through a door, such as a raised step or threshold.  Trim any bushes or trees on the path to your home.  Use bright outdoor lighting.  Clear any walking paths of anything that might make someone trip, such as rocks or tools.  Regularly check to see if handrails are loose or broken. Make sure that both sides of any steps have handrails.  Any raised decks and porches should have guardrails  on the edges.  Have any leaves, snow, or ice cleared regularly.  Use sand or salt on walking paths during winter.  Clean up any spills in your garage right away. This includes oil or grease spills. What can I do in the bathroom?  Use night lights.  Install grab bars by the toilet and in the tub and shower. Do not use towel bars as grab bars.  Use non-skid mats or decals in the tub or shower.  If you need to sit down in the shower, use a plastic, non-slip stool.  Keep the floor dry. Clean up any water that spills on the floor as soon as it happens.  Remove soap buildup in the tub or shower regularly.  Attach bath mats securely with double-sided non-slip rug tape.  Do not have throw rugs and other things on the floor that can make you trip. What can I do in the bedroom?  Use night lights.  Make sure that you have a light by your bed that is easy to reach.  Do not use any sheets or blankets that are too big for your bed. They should not hang down onto the floor.  Have a firm chair that has side arms. You can use this for support while you get dressed.  Do not have throw rugs and other things on the floor that can make you trip. What can I do in the kitchen?  Clean up any spills right away.  Avoid walking on wet floors.  Keep items that you use a lot in easy-to-reach places.  If you need to reach something above you, use a strong step stool that has a grab bar.  Keep electrical cords out of the way.  Do not use floor polish or wax that makes floors  slippery. If you must use wax, use non-skid floor wax.  Do not have throw rugs and other things on the floor that can make you trip. What can I do with my stairs?  Do not leave any items on the stairs.  Make sure that there are handrails on both sides of the stairs and use them. Fix handrails that are broken or loose. Make sure that handrails are as long as the stairways.  Check any carpeting to make sure that it is  firmly attached to the stairs. Fix any carpet that is loose or worn.  Avoid having throw rugs at the top or bottom of the stairs. If you do have throw rugs, attach them to the floor with carpet tape.  Make sure that you have a light switch at the top of the stairs and the bottom of the stairs. If you do not have them, ask someone to add them for you. What else can I do to help prevent falls?  Wear shoes that: ? Do not have high heels. ? Have rubber bottoms. ? Are comfortable and fit you well. ? Are closed at the toe. Do not wear sandals.  If you use a stepladder: ? Make sure that it is fully opened. Do not climb a closed stepladder. ? Make sure that both sides of the stepladder are locked into place. ? Ask someone to hold it for you, if possible.  Clearly mark and make sure that you can see: ? Any grab bars or handrails. ? First and last steps. ? Where the edge of each step is.  Use tools that help you move around (mobility aids) if they are needed. These include: ? Canes. ? Walkers. ? Scooters. ? Crutches.  Turn on the lights when you go into a dark area. Replace any light bulbs as soon as they burn out.  Set up your furniture so you have a clear path. Avoid moving your furniture around.  If any of your floors are uneven, fix them.  If there are any pets around you, be aware of where they are.  Review your medicines with your doctor. Some medicines can make you feel dizzy. This can increase your chance of falling. Ask your doctor what other things that you can do to help prevent falls. This information is not intended to replace advice given to you by your health care provider. Make sure you discuss any questions you have with your health care provider. Document Released: 03/06/2009 Document Revised: 10/16/2015 Document Reviewed: 06/14/2014 Elsevier Interactive Patient Education  2018 [place] Maintenance, Male A healthy lifestyle and preventive care is  important for your health and wellness. Ask your health care provider about what schedule of regular examinations is right for you. What should I know about weight and diet? Eat a Healthy Diet  Eat plenty of vegetables, fruits, whole grains, low-fat dairy products, and lean protein.  Do not eat a lot of foods high in solid fats, added sugars, or salt.  Maintain a Healthy Weight Regular exercise can help you achieve or maintain a healthy weight. You should:  Do at least 150 minutes of exercise each week. The exercise should increase your heart rate and make you sweat (moderate-intensity exercise).  Do strength-training exercises at least twice a week.  Watch Your Levels of Cholesterol and Blood Lipids  Have your blood tested for lipids and cholesterol every 5 years starting at 73 years of age. If you are at high risk for  heart disease, you should start having your blood tested when you are 73 years old. You may need to have your cholesterol levels checked more often if: ? Your lipid or cholesterol levels are high. ? You are older than 73 years of age. ? You are at high risk for heart disease.  What should I know about cancer screening? Many types of cancers can be detected early and may often be prevented. Lung Cancer  You should be screened every year for lung cancer if: ? You are a current smoker who has smoked for at least 30 years. ? You are a former smoker who has quit within the past 15 years.  Talk to your health care provider about your screening options, when you should start screening, and how often you should be screened.  Colorectal Cancer  Routine colorectal cancer screening usually begins at 73 years of age and should be repeated every 5-10 years until you are 73 years old. You may need to be screened more often if early forms of precancerous polyps or small growths are found. Your health care provider may recommend screening at an earlier age if you have risk factors  for colon cancer.  Your health care provider may recommend using home test kits to check for hidden blood in the stool.  A small camera at the end of a tube can be used to examine your colon (sigmoidoscopy or colonoscopy). This checks for the earliest forms of colorectal cancer.  Prostate and Testicular Cancer  Depending on your age and overall health, your health care provider may do certain tests to screen for prostate and testicular cancer.  Talk to your health care provider about any symptoms or concerns you have about testicular or prostate cancer.  Skin Cancer  Check your skin from head to toe regularly.  Tell your health care provider about any new moles or changes in moles, especially if: ? There is a change in a mole's size, shape, or color. ? You have a mole that is larger than a pencil eraser.  Always use sunscreen. Apply sunscreen liberally and repeat throughout the day.  Protect yourself by wearing long sleeves, pants, a wide-brimmed hat, and sunglasses when outside.  What should I know about heart disease, diabetes, and high blood pressure?  If you are 55-49 years of age, have your blood pressure checked every 3-5 years. If you are 12 years of age or older, have your blood pressure checked every year. You should have your blood pressure measured twice-once when you are at a hospital or clinic, and once when you are not at a hospital or clinic. Record the average of the two measurements. To check your blood pressure when you are not at a hospital or clinic, you can use: ? An automated blood pressure machine at a pharmacy. ? A home blood pressure monitor.  Talk to your health care provider about your target blood pressure.  If you are between 30-28 years old, ask your health care provider if you should take aspirin to prevent heart disease.  Have regular diabetes screenings by checking your fasting blood sugar level. ? If you are at a normal weight and have a low risk  for diabetes, have this test once every three years after the age of 58. ? If you are overweight and have a high risk for diabetes, consider being tested at a younger age or more often.  A one-time screening for abdominal aortic aneurysm (AAA) by ultrasound is recommended for  men aged 96-75 years who are current or former smokers. What should I know about preventing infection? Hepatitis B If you have a higher risk for hepatitis B, you should be screened for this virus. Talk with your health care provider to find out if you are at risk for hepatitis B infection. Hepatitis C Blood testing is recommended for:  Everyone born from [birth year] through 1967.  Anyone with known risk factors for hepatitis C.  Sexually Transmitted Diseases (STDs)  You should be screened each year for STDs including gonorrhea and chlamydia if: ? You are sexually active and are younger than 73 years of age. ? You are older than 73 years of age and your health care provider tells you that you are at risk for this type of infection. ? Your sexual activity has changed since you were last screened and you are at an increased risk for chlamydia or gonorrhea. Ask your health care provider if you are at risk.  Talk with your health care provider about whether you are at high risk of being infected with HIV. Your health care provider may recommend a prescription medicine to help prevent HIV infection.  What else can I do?  Schedule regular health, dental, and eye exams.  Stay current with your vaccines (immunizations).  Do not use any tobacco products, such as cigarettes, chewing tobacco, and e-cigarettes. If you need help quitting, ask your health care provider.  Limit alcohol intake to no more than 2 drinks per day. One drink equals 12 ounces of beer, 5 ounces of wine, or 1 ounces of hard liquor.  Do not use street drugs.  Do not share needles.  Ask your health care provider for help if you need support or information  about quitting drugs.  Tell your health care provider if you often feel depressed.  Tell your health care provider if you have ever been abused or do not feel safe at home. This information is not intended to replace advice given to you by your health care provider. Make sure you discuss any questions you have with your health care provider. Document Released: 11/06/2007 Document Revised: 01/07/2016 Document Reviewed: 02/11/2015 Elsevier Interactive Patient Education  [employer].

## 2017-02-15 NOTE — Progress Notes (Signed)
Devyn Sheerin R., DO  

## 2017-02-16 ENCOUNTER — Encounter: Payer: Self-pay | Admitting: Radiation Oncology

## 2017-02-16 ENCOUNTER — Ambulatory Visit
Admission: RE | Admit: 2017-02-16 | Discharge: 2017-02-16 | Disposition: A | Discharge status: Home / Self Care | Payer: Medicare HMO | Source: Ambulatory Visit | Attending: Radiation Oncology | Admitting: Radiation Oncology

## 2017-02-16 VITALS — BP 126/79 | HR 66 | Temp 98.3°F | Resp 20 | Wt 208.5 lb

## 2017-02-16 DIAGNOSIS — Z79899 Other long term (current) drug therapy: Secondary | ICD-10-CM | POA: Diagnosis not present

## 2017-02-16 DIAGNOSIS — Z87891 Personal history of nicotine dependence: Secondary | ICD-10-CM | POA: Insufficient documentation

## 2017-02-16 DIAGNOSIS — C61 Malignant neoplasm of prostate: Secondary | ICD-10-CM | POA: Insufficient documentation

## 2017-02-16 DIAGNOSIS — I2510 Atherosclerotic heart disease of native coronary artery without angina pectoris: Secondary | ICD-10-CM | POA: Insufficient documentation

## 2017-02-16 DIAGNOSIS — Z7982 Long term (current) use of aspirin: Secondary | ICD-10-CM | POA: Diagnosis not present

## 2017-02-16 DIAGNOSIS — Z510 Encounter for antineoplastic radiation therapy: Secondary | ICD-10-CM | POA: Diagnosis not present

## 2017-02-16 DIAGNOSIS — K219 Gastro-esophageal reflux disease without esophagitis: Secondary | ICD-10-CM | POA: Insufficient documentation

## 2017-02-16 DIAGNOSIS — H9190 Unspecified hearing loss, unspecified ear: Secondary | ICD-10-CM | POA: Diagnosis not present

## 2017-02-16 DIAGNOSIS — N529 Male erectile dysfunction, unspecified: Secondary | ICD-10-CM | POA: Insufficient documentation

## 2017-02-16 DIAGNOSIS — H524 Presbyopia: Secondary | ICD-10-CM | POA: Diagnosis not present

## 2017-02-16 DIAGNOSIS — R972 Elevated prostate specific antigen [PSA]: Secondary | ICD-10-CM | POA: Diagnosis not present

## 2017-02-16 DIAGNOSIS — E785 Hyperlipidemia, unspecified: Secondary | ICD-10-CM | POA: Insufficient documentation

## 2017-02-16 NOTE — Progress Notes (Signed)
Radiation Oncology         [phone] ________________________________  Initial Outpatient Consultation  Name: Carl Harrell MRN: [id number]  Date: 02/16/2017  DOB: 09-13-1945  CC:Kim, Nickola Major, DO  [name], MD   REFERRING PHYSICIAN: [name], MD  DIAGNOSIS: 71 y.o. gentleman with Stage T1c adenocarcinoma of the prostate with Gleason Score of 4+3, and PSA of 7.38    ICD-10-CM   1. Prostate cancer [facility]) C61     HISTORY OF PRESENT ILLNESS: Carl MCEUEN is a 71 y.o. male with a diagnosis of prostate cancer. He was noted to have an elevated PSA level of 7.38 by his primary care physician, Dr. Colin Benton on 10/25/2016. Prior PSA on 01/16/16 was 5.40.  Accordingly, he was referred for evaluation in urology by Dr. [name] on 12/23/2016, where a digital rectal examination was performed at that time revealing a 15 mm nodule in the left apex.  The patient proceeded to transrectal ultrasound with 12 biopsies of the prostate on 01/21/2017.  The prostate volume measured 118 cc.  He takes Tamsulosin daily which helps relieve his moderate urinary symptoms.  Out of 12 core biopsies, 4 were positive. The left apex was negative. The maximum Gleason score was 4+3, and this was seen in left base, right base, right base lateral. 3+3 was also noted in left base lateral.   Biopsies of prostate revealed:    The patient reviewed the biopsy results with his urologist and he has kindly been referred today for discussion of potential radiation treatment options.  Of note, PMH is significant for CAD requiring cardiac stent placement and chronic anticoagulation with Plavix.  Otherwise, he generally enjoys excellent health and remains quite active with bowling and golfing regularly.  PREVIOUS RADIATION THERAPY: No  PAST MEDICAL HISTORY:  Past Medical History:  Diagnosis Date  . ALLERGIC RHINITIS 01/30/2007  . Arthritis    "hands" (05/29/2014)  . Coronary artery disease    LHC (05/29/14): pLAD  50-70, pRCA 99 (L>R collats), EF 55% >> PCI: 3.5 x 28 mm Xience Alpine DES to [employer]  . Cough secondary to angiotensin converting enzyme inhibitor (ACE-I) 05/28/2013  . ED (erectile dysfunction)   . GERD 09/26/2007  . HYPERLIPIDEMIA 09/26/2007  . Hypertension   . PLANTAR FASCIITIS, BILATERAL 07/08/2009  . Unspecified hearing loss 06/16/2009   wears aides both ears      PAST SURGICAL HISTORY: Past Surgical History:  Procedure Laterality Date  . CARDIAC CATHETERIZATION  05/29/2014   Procedure: CORONARY STENT INTERVENTION;  Surgeon: Sinclair Grooms, MD;  Location: [facility] CATH LAB;  Service: Cardiovascular;;  Prox RCA  . CORONARY ANGIOPLASTY WITH STENT PLACEMENT  05/29/2014   "1"  . KNEE ARTHROSCOPY Right ~ 2005  . LEFT HEART CATHETERIZATION WITH CORONARY ANGIOGRAM N/A 05/29/2014   Procedure: LEFT HEART CATHETERIZATION WITH CORONARY ANGIOGRAM;  Surgeon: Sinclair Grooms, MD;  Location: [facility] CATH LAB;  Service: Cardiovascular;  Laterality: N/A;    FAMILY HISTORY:  Family History  Problem Relation Age of Onset  . Heart attack Mother   . Heart disease Mother   . Heart attack Father   . Heart disease Father   . COPD Neg Hx        family hx  . Hyperlipidemia Neg Hx        family hx    SOCIAL HISTORY:  Social History   Social History  . Marital status: Married    Spouse name: N/A  . Number of  children: N/A  . Years of education: N/A   Occupational History  . Not on file.   Social History Main Topics  . Smoking status: Former Smoker    Types: Cigarettes    Quit date: 05/24/1978  . Smokeless tobacco: Never Used     Comment: 05/29/2014 "smoked socially; nothing since 1980"  . Alcohol use 1.2 oz/week    2 Shots of liquor per week     Comment: beer or two a week  . Drug use: No  . Sexual activity: Not Currently   Other Topics Concern  . Not on file   Social History Narrative   Lives with wife in a one story home.  Has 2 children.  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  Education: 3 years of college.     Does wood working.     ALLERGIES: Lisinopril  MEDICATIONS:  Current Outpatient Prescriptions  Medication Sig Dispense Refill  . aspirin 81 MG chewable tablet Chew 1 tablet (81 mg total) by mouth daily.    . clopidogrel (PLAVIX) 75 MG tablet     . ezetimibe (ZETIA) 10 MG tablet Take 1 tablet (10 mg total) by mouth daily. 90 tablet 3  . fexofenadine (ALLEGRA) 180 MG tablet Take 180 mg by mouth daily as needed for allergies or rhinitis.    Marland Kitchen losartan-hydrochlorothiazide (HYZAAR) 50-12.5 MG tablet Take 1 tablet by mouth daily. 90 tablet 3  . metoprolol tartrate (LOPRESSOR) 25 MG tablet take 1 tablet by mouth twice a day 180 tablet 5  . Multiple Vitamin (MULTIVITAMIN WITH MINERALS) TABS tablet Take 1 tablet by mouth daily.    Marland Kitchen NITROSTAT 0.4 MG SL tablet PLACE 1 TABLET BY MOUTH UNDER TONGUE EVERY 5 MINUTES AS NEEDED FOR CHEST PAIN 25 tablet 12  . omeprazole (PRILOSEC) 20 MG capsule take 1 capsule by mouth twice a day 100 capsule 4  . simvastatin (ZOCOR) 40 MG tablet take 1 tablet by mouth at bedtime 90 tablet 3  . TAMSULOSIN HCL PO Take by mouth.     No current facility-administered medications for this encounter.     REVIEW OF SYSTEMS:  On review of systems, the patient reports that he is doing well overall. He denies any chest pain, shortness of breath, cough, fevers, chills, or night sweats. He reports an unintended weight gain of 30 pounds since retirement. He denies any bowel disturbances, and denies abdominal pain, nausea or vomiting. He denies any new musculoskeletal or joint aches or pains. His IPSS was 11, indicating moderate urinary symptoms, including weak stream. He denies any dysuria, hematuria, or urgency. He denies ED and is able to complete sexual activity with all attempts. SHIM score is 22. A complete review of systems is obtained and is otherwise negative.    PHYSICAL EXAM:  Wt Readings from Last 3 Encounters:  02/16/17 208 lb 8 oz (94.6 kg)  02/15/17 210 lb (95.3 kg)    02/07/17 210 lb 11.2 oz (95.6 kg)   Temp Readings from Last 3 Encounters:  02/16/17 98.3 F (36.8 C) (Oral)  02/07/17 97.7 F (36.5 C) (Oral)  11/11/16 97.6 F (36.4 C) (Oral)   BP Readings from Last 3 Encounters:  02/16/17 126/79  02/15/17 120/70  02/07/17 102/80   Pulse Readings from Last 3 Encounters:  02/16/17 66  02/15/17 92  02/07/17 69   Pain Assessment Pain Score: 0-No pain/10  In general this is a well appearing Caucasian man in no acute distress. He is alert and oriented x4 and appropriate throughout the  examination. HEENT reveals that the patient is normocephalic, atraumatic. Cardiovascular exam reveals a regular rate and rhythm, no clicks rubs or murmurs are auscultated. Chest is clear to auscultation bilaterally. Lymphatic assessment is performed and does not reveal any adenopathy in the cervical, supraclavicular, axillary, or inguinal chains. Abdomen is intact. The abdomen is soft, non tender, non distended. Lower extremities are negative for pretibial pitting edema, deep calf tenderness, cyanosis or clubbing.   KPS = 100  100 - Normal; no complaints; no evidence of disease. 90   - Able to carry on normal activity; minor signs or symptoms of disease. 80   - Normal activity with effort; some signs or symptoms of disease. 87   - Cares for self; unable to carry on normal activity or to do active work. 60   - Requires occasional assistance, but is able to care for most of his personal needs. 50   - Requires considerable assistance and frequent medical care. 8   - Disabled; requires special care and assistance. 95   - Severely disabled; hospital admission is indicated although death not imminent. 79   - Very sick; hospital admission necessary; active supportive treatment necessary. 10   - Moribund; fatal processes progressing rapidly. 0     - Dead  Karnofsky DA, Abelmann [place], Craver LS and Burchenal [facility] [phone]) The use of the nitrogen mustards in the palliative  treatment of carcinoma: with particular reference to bronchogenic carcinoma Cancer 1 634-56  LABORATORY DATA:  Lab Results  Component Value Date   WBC 7.9 02/14/2017   HGB 14.6 02/14/2017   HCT 45.7 02/14/2017   MCV 86.4 02/14/2017   PLT 253.0 02/14/2017   Lab Results  Component Value Date   NA 143 02/14/2017   K 4.2 02/14/2017   CL 104 02/14/2017   CO2 28 02/14/2017   Lab Results  Component Value Date   ALT 33 08/05/2016   AST 21 08/05/2016   ALKPHOS 51 08/05/2016   BILITOT 0.6 08/05/2016     RADIOGRAPHY: No results found.    IMPRESSION/PLAN: 1. 71 y.o. gentleman with Stage T1c adenocarcinoma of the prostate with Gleason Score of 4+3, and PSA of 7.38. We discussed the patient's workup and outlined the nature of prostate cancer in this setting. The patient's T stage, Gleason's score, and PSA put him into the unfavorable intermediate risk group. Accordingly, he is eligible for several treatment options including 8 weeks of external radiation, brachytherapy or 5 weeks of EBRT with brachytherapy boost. We discussed the available radiation techniques, and focused on the details and logistics and delivery. The patient is not an ideal candidate for brachytherapy with a prostate volume of 118 gm. We discussed and outlined the risks, benefits, short and long-term effects associated with radiotherapy and compared and contrasted these with prostatectomy. Due to his age and history of CAD, the patient is not an ideal candidate for prostatectomy.  He expressed interest in external beam radiotherapy but is undecided at this time. We also detailed the role of ADT in the treatment of intermediate risk prostate cancer and outlined the associated side effects that could be expected with this therapy.  We advised that twhile this is certainly an option, it is not strongly recommended in his case.  He prefers to avoid ADT unless absolutely necessary.  At the conclusion of our conversation, the patient  elects to take some additional time to think about all of his options but says he is almost certain that he would like to  proceed with prostate IMRT. We will follow up with the patient regarding his final treatment preference in the next 1-2 weeks.  We will share our findings with Dr. Karsten Ro, and if the patient decides to proceed with IMRT, we will move forward with scheduling placement of three gold fiducial markers into the prostate to proceed with IMRT in the near future.  He has an upcoming [place] trip to [place] coming up from 10/6-10/12/18 and prefers to begin treatment after this trip.  We spent 50 minutes face to face with the patient and more than 50% of that time was spent in counseling and/or coordination of care.   Nicholos Johns, PA-C    Tyler Pita, MD  [facility] Direct Dial: [phone]  Fax: [phone] [email or web address]  Skype  [email or web address]   This document serves as a record of services personally performed by Tyler Pita, MD and Freeman Caldron, PA-C. It was created on their behalf by Rae Lips, a trained medical scribe. The creation of this record is based on the scribe's personal observations and the providers' statements to them. This document has been checked and approved by the attending providers.

## 2017-02-17 DIAGNOSIS — D126 Benign neoplasm of colon, unspecified: Secondary | ICD-10-CM | POA: Diagnosis not present

## 2017-02-18 ENCOUNTER — Telehealth: Payer: Self-pay | Admitting: *Deleted

## 2017-02-18 NOTE — Telephone Encounter (Signed)
Called patient to inform of sim appt. On 02-23-17 @ 3 pm, spoke with patient's wife [name] and she is aware of this appt.

## 2017-02-20 ENCOUNTER — Observation Stay (HOSPITAL_COMMUNITY)
Admission: EM | Admit: 2017-02-20 | Discharge: 2017-02-22 | Disposition: A | Discharge status: Home / Self Care | Payer: Medicare HMO | Attending: Internal Medicine | Admitting: Internal Medicine

## 2017-02-20 ENCOUNTER — Encounter (HOSPITAL_COMMUNITY): Payer: Self-pay

## 2017-02-20 ENCOUNTER — Other Ambulatory Visit: Payer: Self-pay

## 2017-02-20 DIAGNOSIS — Z79899 Other long term (current) drug therapy: Secondary | ICD-10-CM | POA: Insufficient documentation

## 2017-02-20 DIAGNOSIS — N400 Enlarged prostate without lower urinary tract symptoms: Secondary | ICD-10-CM | POA: Insufficient documentation

## 2017-02-20 DIAGNOSIS — I2510 Atherosclerotic heart disease of native coronary artery without angina pectoris: Secondary | ICD-10-CM | POA: Diagnosis not present

## 2017-02-20 DIAGNOSIS — Z7982 Long term (current) use of aspirin: Secondary | ICD-10-CM | POA: Insufficient documentation

## 2017-02-20 DIAGNOSIS — E785 Hyperlipidemia, unspecified: Secondary | ICD-10-CM | POA: Diagnosis not present

## 2017-02-20 DIAGNOSIS — Z955 Presence of coronary angioplasty implant and graft: Secondary | ICD-10-CM | POA: Diagnosis not present

## 2017-02-20 DIAGNOSIS — Z7902 Long term (current) use of antithrombotics/antiplatelets: Secondary | ICD-10-CM | POA: Diagnosis not present

## 2017-02-20 DIAGNOSIS — K921 Melena: Secondary | ICD-10-CM | POA: Diagnosis not present

## 2017-02-20 DIAGNOSIS — I9589 Other hypotension: Secondary | ICD-10-CM | POA: Diagnosis not present

## 2017-02-20 DIAGNOSIS — K625 Hemorrhage of anus and rectum: Secondary | ICD-10-CM

## 2017-02-20 DIAGNOSIS — I10 Essential (primary) hypertension: Secondary | ICD-10-CM | POA: Insufficient documentation

## 2017-02-20 DIAGNOSIS — Z87891 Personal history of nicotine dependence: Secondary | ICD-10-CM | POA: Insufficient documentation

## 2017-02-20 DIAGNOSIS — Z923 Personal history of irradiation: Secondary | ICD-10-CM | POA: Insufficient documentation

## 2017-02-20 DIAGNOSIS — K922 Gastrointestinal hemorrhage, unspecified: Secondary | ICD-10-CM | POA: Diagnosis not present

## 2017-02-20 DIAGNOSIS — C61 Malignant neoplasm of prostate: Secondary | ICD-10-CM

## 2017-02-20 DIAGNOSIS — K219 Gastro-esophageal reflux disease without esophagitis: Secondary | ICD-10-CM | POA: Insufficient documentation

## 2017-02-20 DIAGNOSIS — M1990 Unspecified osteoarthritis, unspecified site: Secondary | ICD-10-CM | POA: Insufficient documentation

## 2017-02-20 LAB — VITAMIN B12: Vitamin B-12: 424 pg/mL (ref 180–914)

## 2017-02-20 LAB — CBC
HEMATOCRIT: 42.4 % (ref 39.0–52.0)
HEMATOCRIT: 40.5 % (ref 39.0–52.0)
HEMOGLOBIN: 13.5 g/dL (ref 13.0–17.0)
HEMOGLOBIN: 12.9 g/dL — AB (ref 13.0–17.0)
MCH: 27.4 pg (ref 26.0–34.0)
MCH: 27.1 pg (ref 26.0–34.0)
MCHC: 31.8 g/dL (ref 30.0–36.0)
MCHC: 31.9 g/dL (ref 30.0–36.0)
MCV: 86 fL (ref 78.0–100.0)
MCV: 85.1 fL (ref 78.0–100.0)
PLATELETS: 256 10*3/uL (ref 150–400)
Platelets: 263 10*3/uL (ref 150–400)
RBC: 4.93 MIL/uL (ref 4.22–5.81)
RBC: 4.76 MIL/uL (ref 4.22–5.81)
RDW: 13.9 % (ref 11.5–15.5)
RDW: 13.8 % (ref 11.5–15.5)
WBC: 10 10*3/uL (ref 4.0–10.5)
WBC: 9.8 10*3/uL (ref 4.0–10.5)

## 2017-02-20 LAB — TYPE AND SCREEN
ABO/RH(D): O POS
Antibody Screen: NEGATIVE

## 2017-02-20 LAB — COMPREHENSIVE METABOLIC PANEL
ALBUMIN: 3.7 g/dL (ref 3.5–5.0)
ALT: 25 U/L (ref 17–63)
ANION GAP: 9 (ref 5–15)
AST: 22 U/L (ref 15–41)
Alkaline Phosphatase: 45 U/L (ref 38–126)
BILIRUBIN TOTAL: 0.7 mg/dL (ref 0.3–1.2)
BUN: 15 mg/dL (ref 6–20)
CO2: 27 mmol/L (ref 22–32)
Calcium: 8.5 mg/dL — ABNORMAL LOW (ref 8.9–10.3)
Chloride: 101 mmol/L (ref 101–111)
Creatinine, Ser: 1.22 mg/dL (ref 0.61–1.24)
GFR calc Af Amer: >60 mL/min (ref >60)
GFR, EST NON AFRICAN AMERICAN: 57 mL/min — AB (ref >60)
Glucose, Bld: 131 mg/dL — ABNORMAL HIGH (ref 65–99)
POTASSIUM: 3.9 mmol/L (ref 3.5–5.1)
Sodium: 137 mmol/L (ref 135–145)
TOTAL PROTEIN: 6.3 g/dL — AB (ref 6.5–8.1)

## 2017-02-20 LAB — IRON AND TIBC
IRON: 80 ug/dL (ref 45–182)
Saturation Ratios: 32 % (ref 17.9–39.5)
TIBC: 248 ug/dL — ABNORMAL LOW (ref 250–450)
UIBC: 168 ug/dL

## 2017-02-20 LAB — RETICULOCYTES
RBC.: 4.63 MIL/uL (ref 4.22–5.81)
RETIC COUNT ABSOLUTE: 46.3 10*3/uL (ref 19.0–186.0)
RETIC CT PCT: 1 % (ref 0.4–3.1)

## 2017-02-20 LAB — FERRITIN: FERRITIN: 91 ng/mL (ref 24–336)

## 2017-02-20 LAB — ABO/RH: ABO/RH(D): O POS

## 2017-02-20 LAB — FOLATE: Folate: 21.6 ng/mL (ref >5.9)

## 2017-02-20 LAB — POC OCCULT BLOOD, ED: Fecal Occult Bld: POSITIVE — AB

## 2017-02-20 MED ORDER — SODIUM CHLORIDE 0.9 % IV SOLN: INTRAVENOUS | Status: DC

## 2017-02-20 MED ORDER — SODIUM CHLORIDE 0.9 % IV SOLN
INTRAVENOUS | Status: AC
  Administered 2017-02-20: 10:00:00 via INTRAVENOUS

## 2017-02-20 MED ORDER — SODIUM CHLORIDE 0.9% FLUSH
3.0000 mL | Freq: Two times a day (BID) | INTRAVENOUS | Status: DC
  Administered 2017-02-21 – 2017-02-22 (×2): 3 mL via INTRAVENOUS

## 2017-02-20 MED ORDER — ONDANSETRON HCL 4 MG PO TABS
4.0000 mg | ORAL_TABLET | Freq: Four times a day (QID) | ORAL | Status: DC | PRN
  Filled 2017-02-20: qty 1

## 2017-02-20 MED ORDER — ACETAMINOPHEN 325 MG PO TABS: 650.0000 mg | ORAL_TABLET | Freq: Four times a day (QID) | ORAL | Status: DC | PRN

## 2017-02-20 MED ORDER — SODIUM CHLORIDE 0.9 % IV SOLN
INTRAVENOUS | Status: DC
  Administered 2017-02-20 (×2): via INTRAVENOUS

## 2017-02-20 MED ORDER — ACETAMINOPHEN 650 MG RE SUPP: 650.0000 mg | Freq: Four times a day (QID) | RECTAL | Status: DC | PRN

## 2017-02-20 MED ORDER — ONDANSETRON HCL 4 MG/2ML IJ SOLN: 4.0000 mg | Freq: Four times a day (QID) | INTRAMUSCULAR | Status: DC | PRN

## 2017-02-20 NOTE — ED Notes (Signed)
Pt now diaphoretic, pale, states he feels different. No radial pulses at this time, BP 66/41.

## 2017-02-20 NOTE — ED Notes (Signed)
585ml bolus given in triage.

## 2017-02-20 NOTE — ED Provider Notes (Signed)
Emergency Department Provider Note   I have reviewed the triage vital signs and the nursing notes.   HISTORY  Chief Complaint Rectal Bleeding   HPI Carl Harrell is a 71 y.o. male a past medical history of coronary artery disease on aspirin and Plavix, hyperlipidemia who presents to the emergency department today secondary to rectal bleeding. Patient states that he's had 4 episodes of dark blood mixed with bright red blood in his stools since around midnight last night. No lightheadedness except for when he was hypotensive in triage. This resolved quickly however. He has not had this problem before but does state that he had a prostate biopsy 2 weeks ago but more importantly had 10 polyps removed one week ago where they also visualized diverticulosis and angiodysplasia. Is no abdominal pain, nausea or vomiting. No urinary symptoms. No back pain. No rashes or pallor. No weakness or numbness anywhere. No other associated modifying symptoms. Has not anything for symptoms. Has not seen anyone prior to me. Symptoms seem to be consistent and not getting better or worse.   Past Medical History:  Diagnosis Date  . ALLERGIC RHINITIS 01/30/2007  . Arthritis    "hands" (05/29/2014)  . Coronary artery disease    LHC (05/29/14): pLAD 50-70, pRCA 99 (L>R collats), EF 55% >> PCI: 3.5 x 28 mm Xience Alpine DES to [employer]  . Cough secondary to angiotensin converting enzyme inhibitor (ACE-I) 05/28/2013  . ED (erectile dysfunction)   . GERD 09/26/2007  . HYPERLIPIDEMIA 09/26/2007  . Hypertension   . PLANTAR FASCIITIS, BILATERAL 07/08/2009  . Unspecified hearing loss 06/16/2009   wears aides both ears    Patient Active Problem List   Diagnosis Date Noted  . Hematochezia 02/20/2017  . Coronary artery disease 02/20/2017  . Hypertension 02/20/2017  . Prostatic cancer ([place]) 02/20/2017  . Prostate cancer ([place]) 02/07/2017  . Type 2 diabetes mellitus without complication, without long-term current use of  insulin ([place]) 11/11/2016  . Hypertension associated with diabetes ([place]) 11/11/2016  . Hyperlipidemia associated with type 2 diabetes mellitus ([place]) 11/11/2016  . CAD (coronary artery disease) 06/20/2014  . UNSPECIFIED HEARING LOSS 06/16/2009  . GERD 09/26/2007  . Hyperlipidemia 09/26/2007  . Allergic rhinitis 01/30/2007    Past Surgical History:  Procedure Laterality Date  . CARDIAC CATHETERIZATION  05/29/2014   Procedure: CORONARY STENT INTERVENTION;  Surgeon: Sinclair Grooms, MD;  Location: [facility] CATH LAB;  Service: Cardiovascular;;  Prox RCA  . CORONARY ANGIOPLASTY WITH STENT PLACEMENT  05/29/2014   "1"  . KNEE ARTHROSCOPY Right ~ 2005  . LEFT HEART CATHETERIZATION WITH CORONARY ANGIOGRAM N/A 05/29/2014   Procedure: LEFT HEART CATHETERIZATION WITH CORONARY ANGIOGRAM;  Surgeon: Sinclair Grooms, MD;  Location: [facility] CATH LAB;  Service: Cardiovascular;  Laterality: N/A;    Current Outpatient Rx  . Order #: [id number] Class: No Print  . Order #: [id number] Class: Historical Med  . Order #: [id number] Class: Normal  . Order #: [id number] Class: Historical Med  . Order #: [id number] Class: Normal  . Order #: [id number] Class: Normal  . Order #: [id number] Class: Historical Med  . Order #: [id number] Class: Normal  . Order #: [id number] Class: Normal  . Order #: [id number] Class: Normal  . Order #: [id number] Class: Historical Med    Allergies Lisinopril  Family History  Problem Relation Age of Onset  . Heart attack Mother   . Heart disease Mother   . Heart attack Father   . Heart disease Father   . COPD Neg Hx  family hx  . Hyperlipidemia Neg Hx        family hx    Social History Social History  Substance Use Topics  . Smoking status: Former Smoker    Types: Cigarettes    Quit date: 05/24/1978  . Smokeless tobacco: Never Used     Comment: 05/29/2014 "smoked socially; nothing since 1980"  . Alcohol use 1.2 oz/week    2 Shots of liquor per week     Comment: beer or two a week    Review  of Systems  All other systems negative except as documented in the HPI. All pertinent positives and negatives as reviewed in the HPI. ____________________________________________   PHYSICAL EXAM:  VITAL SIGNS: ED Triage Vitals [02/20/17 0624]  Enc Vitals Group     BP 130/81     Pulse Rate 84     Resp 18     Temp 98.7 F (37.1 C)     Temp Source Oral     SpO2 97 %     Weight 205 lb (93 kg)     Height 5\' 7"  (1.702 m)     Head Circumference      Peak Flow      Pain Score 0     Pain Loc      Pain Edu?      Excl. in [place]?     Constitutional: Alert and oriented. Well appearing and in no acute distress. Eyes: Conjunctivae are normal. PERRL. EOMI. Head: Atraumatic. Nose: No congestion/rhinnorhea. Mouth/Throat: Mucous membranes are moist.  Oropharynx non-erythematous. Neck: No stridor.  No meningeal signs.   Cardiovascular: Normal rate, regular rhythm. Good peripheral circulation. Grossly normal heart sounds.   Respiratory: Normal respiratory effort.  No retractions. Lungs CTAB. Gastrointestinal: Soft and nontender. No distention.  Musculoskeletal: No lower extremity tenderness nor edema. No gross deformities of extremities. Neurologic:  Normal speech and language. No gross focal neurologic deficits are appreciated.  Skin:  Skin is warm, dry and intact. No rash noted.   ____________________________________________   LABS (all labs ordered are listed, but only abnormal results are displayed)  Labs Reviewed  COMPREHENSIVE METABOLIC PANEL - Abnormal; Notable for the following:       Result Value   Glucose, Bld 131 (*)    Calcium 8.5 (*)    Total Protein 6.3 (*)    GFR calc non Af Amer 57 (*)    All other components within normal limits  POC OCCULT BLOOD, ED - Abnormal; Notable for the following:    Fecal Occult Bld POSITIVE (*)    All other components within normal limits  CBC  CBC  VITAMIN B12  FOLATE  IRON AND TIBC  FERRITIN  RETICULOCYTES  TYPE AND SCREEN    ABO/RH   ____________________________________________  EKG   EKG Interpretation  Date/Time:  Sunday February 20 2017 06:58:17 EDT Ventricular Rate:  76 PR Interval:    QRS Duration: 114 QT Interval:  423 QTC Calculation: 476 R Axis:   -51 Text Interpretation:  Sinus rhythm Left anterior fascicular block Borderline prolonged QT interval No significant change since last tracing Confirmed by Merrily Pew [phone]) on 02/20/2017 9:30:09 AM       ____________________________________________  RADIOLOGY  No results found.  ____________________________________________   PROCEDURES  Procedure(s) performed:   Procedures   ____________________________________________   INITIAL IMPRESSION / ASSESSMENT AND PLAN / ED COURSE  Pertinent labs & imaging results that were available during my care of the patient were reviewed by me and  considered in my medical decision making (see chart for details).  Patient with a normal hemoglobin but persistent rectal bleeding. The patient's hemodynamically stable and not having large amounts of bright red blood to be considered for outpatient therapy however he did just have 10 polyps removed multiple other sources for his bleeding. I will discuss it with his gastroenterologist.  Patient with another bloody bowel movement while in the emergency department. This concerned me as it could be ongoing and not resolving that the patient previously thought. Hemoccult positive. Still hemodynamically stable. We'll discuss to GI about observation and further workup.  Discussed with Dr. Michail Sermon who recommends observation for continued bleed and possible colonoscopy if not improving or Q8H hemoglobins are trending down.   PCP Colin Benton, Will consult triad.  ____________________________________________  FINAL CLINICAL IMPRESSION(S) / ED DIAGNOSES  Final diagnoses:  Rectal bleeding    MEDICATIONS GIVEN DURING THIS VISIT:  Medications  0.9 %   sodium chloride infusion (not administered)     NEW OUTPATIENT MEDICATIONS STARTED DURING THIS VISIT:  New Prescriptions   No medications on file    Note:  This document was prepared using Dragon voice recognition software and may include unintentional dictation errors.   Merrily Pew, MD 02/20/17 [phone]

## 2017-02-20 NOTE — ED Notes (Signed)
Pt ate all of lunch tray 

## 2017-02-20 NOTE — ED Notes (Signed)
Pt arrives via POV from home c/o "filling toilet bowel" with dark stools x4 since around 4PM yesterday after receiving a flu shot. States no hx of same. Has had colonoscopy and polyp biopsy in the last month. Pt reports some weakness. Ambulatory in triage, alert x4, strong pulses, good color.

## 2017-02-20 NOTE — H&P (Signed)
History and Physical    Carl Harrell ATF:[id number] DOB: 11-20-1945 DOA: 02/20/2017   PCP: Lucretia Kern, DO   Attending physician: Dhungel  Patient coming from/Resides with: Private residence/wife  Chief Complaint: Blood in toilet  HPI: Carl Harrell is a 71 y.o. male with medical history significant for CAD status post DES to RCA in 2016 on DAPT, hypertension, dyslipidemia, BPH and recent diagnosis of prostate cancer. Patient presents today ER after complaining of having bloody stools that were thick like pudding that filled the entire toilet bowl-episodes began around 4 PM on 9/29 and he said 4 stools total. He has recently received a flu shot. 2 weeks ago he underwent colonoscopy and polypectomy (total of 10 polyps). DAPT held prior to procedure and resumed shortly thereafter. Patient has not had any nausea, vomiting or abdominal pain. He has not had any chest pain or shortness of breath.  ED Course:  Vital Signs: BP (!) 129/115   Pulse 83   Temp 98.7 F (37.1 C) (Oral)   Resp 17   Ht 5\' 7"  (1.702 m)   Wt 93 kg (205 lb)   SpO2 97%   BMI 32.11 kg/m  Lab data: Sodium 137, potassium 3.9, chloride 101, CO2 27, glucose 131, BUN 15, creatinine 1.22, LFTs not elevated, white count 10,022 not obtained, hemoglobin 13.5, platelets 256,000, FOB positive; he is undergone type and screen and is O+ Medications and treatments: Normal saline bolus as noted above while in triage  Review of Systems:  In addition to the HPI above,  No Fever-chills, myalgias or other constitutional symptoms No Headache, changes with Vision or hearing, new weakness, tingling, numbness in any extremity, dizziness, dysarthria or word finding difficulty, gait disturbance or imbalance, tremors or seizure activity No problems swallowing food or Liquids, indigestion/reflux, choking or coughing while eating, abdominal pain with or after eating No Chest pain, Cough or Shortness of Breath, palpitations, orthopnea or  DOE No Abdominal pain, N/V, melena, dark tarry stools, constipation No dysuria, malodorous urine, hematuria or flank pain No new skin rashes, lesions, masses or bruises, No new joint pains, aches, swelling or redness No recent unintentional weight gain or loss No polyuria, polydypsia or polyphagia   Past Medical History:  Diagnosis Date  . ALLERGIC RHINITIS 01/30/2007  . Arthritis    "hands" (05/29/2014)  . Coronary artery disease    LHC (05/29/14): pLAD 50-70, pRCA 99 (L>R collats), EF 55% >> PCI: 3.5 x 28 mm Xience Alpine DES to [employer]  . Cough secondary to angiotensin converting enzyme inhibitor (ACE-I) 05/28/2013  . ED (erectile dysfunction)   . GERD 09/26/2007  . HYPERLIPIDEMIA 09/26/2007  . Hypertension   . PLANTAR FASCIITIS, BILATERAL 07/08/2009  . Unspecified hearing loss 06/16/2009   wears aides both ears    Past Surgical History:  Procedure Laterality Date  . CARDIAC CATHETERIZATION  05/29/2014   Procedure: CORONARY STENT INTERVENTION;  Surgeon: Sinclair Grooms, MD;  Location[facility] LAB;  Service: Cardiovascular;;  Prox RCA  . CORONARY ANGIOPLASTY WITH STENT PLACEMENT  05/29/2014   "1"  . KNEE ARTHROSCOPY Right ~ 2005  . LEFT HEART CATHETERIZATION WITH CORONARY ANGIOGRAM N/A 05/29/2014   Procedure: LEFT HEART CATHETERIZATION WITH CORONARY ANGIOGRAM;  Surgeon: Sinclair Grooms, MD;  Location: [facility] CATH LAB;  Service: Cardiovascular;  Laterality: N/A;    Social History   Social History  . Marital status: Married    Spouse name: N/A  . Number of children: N/A  . Years  of education: N/A   Occupational History  . Not on file.   Social History Main Topics  . Smoking status: Former Smoker    Types: Cigarettes    Quit date: 05/24/1978  . Smokeless tobacco: Never Used     Comment: 05/29/2014 "smoked socially; nothing since 1980"  . Alcohol use 1.2 oz/week    2 Shots of liquor per week     Comment: beer or two a week  . Drug use: No  . Sexual activity: Not Currently   Other  Topics Concern  . Not on file   Social History Narrative   Lives with wife in a one story home.  Has 2 children.  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  Education: 3 years of college.    Does wood working.     Mobility: Independent Work history: Not obtained   Allergies  Allergen Reactions  . Lisinopril Cough    Family History  Problem Relation Age of Onset  . Heart attack Mother   . Heart disease Mother   . Heart attack Father   . Heart disease Father   . COPD Neg Hx        family hx  . Hyperlipidemia Neg Hx        family hx     Prior to Admission medications   Medication Sig Start Date End Date Taking? Authorizing Provider  aspirin 81 MG chewable tablet Chew 1 tablet (81 mg total) by mouth daily. 05/30/14   Brett Canales, PA-C  clopidogrel (PLAVIX) 75 MG tablet  11/29/16   [provider]  ezetimibe (ZETIA) 10 MG tablet Take 1 tablet (10 mg total) by mouth daily. 05/03/16   Sueanne Margarita, MD  fexofenadine (ALLEGRA) 180 MG tablet Take 180 mg by mouth daily as needed for allergies or rhinitis.    [provider]  losartan-hydrochlorothiazide (HYZAAR) 50-12.5 MG tablet Take 1 tablet by mouth daily. 05/04/16   Lucretia Kern, DO  metoprolol tartrate (LOPRESSOR) 25 MG tablet take 1 tablet by mouth twice a day 03/23/16   Dorena Cookey, MD  Multiple Vitamin (MULTIVITAMIN WITH MINERALS) TABS tablet Take 1 tablet by mouth daily.    [provider]  NITROSTAT 0.4 MG SL tablet PLACE 1 TABLET BY MOUTH UNDER TONGUE EVERY 5 MINUTES AS NEEDED FOR CHEST PAIN 07/15/16   Sueanne Margarita, MD  omeprazole (PRILOSEC) 20 MG capsule take 1 capsule by mouth twice a day 06/28/16   Dorena Cookey, MD  simvastatin (ZOCOR) 40 MG tablet take 1 tablet by mouth at bedtime 03/23/16   Dorena Cookey, MD  TAMSULOSIN HCL PO Take by mouth.    [provider]    Physical Exam: Vitals:   02/20/17 0830 02/20/17 0900 02/20/17 0915 02/20/17 0930  BP: 120/81 131/81 130/85 (!) 129/115   Pulse: 81 97 67 83  Resp: 16 19 14 17   Temp:      TempSrc:      SpO2: 98% 95% 97% 97%  Weight:      Height:          Constitutional: NAD, calm, comfortable Eyes: PERRL, lids and conjunctivae normal ENMT: Mucous membranes are moist. Posterior pharynx clear of any exudate or lesions.Normal dentition.  Neck: normal, supple, no masses, no thyromegaly Respiratory: clear to auscultation bilaterally, no wheezing, no crackles. Normal respiratory effort. No accessory muscle use.  Cardiovascular: Regular rate and rhythm, no murmurs / rubs / gallops. No extremity edema. 2+ pedal pulses. No carotid bruits.  Abdomen: no tenderness, no masses palpated. No hepatosplenomegaly. Bowel sounds positive.  Musculoskeletal: no clubbing / cyanosis. No joint deformity upper and lower extremities. Good ROM, no contractures. Normal muscle tone.  Skin: no rashes, lesions, ulcers. No induration Neurologic: CN 2-12 grossly intact. Sensation intact, DTR normal. Strength 5/5 x all 4 extremities.  Psychiatric: Normal judgment and insight. Alert and oriented x 3. Normal mood.    Labs on Admission: I have personally reviewed following labs and imaging studies  CBC:  Recent Labs Lab 02/14/17 0727 02/20/17 0627  WBC 7.9 10.0  HGB 14.6 13.5  HCT 45.7 42.4  MCV 86.4 86.0  PLT 253.0 938   Basic Metabolic Panel:  Recent Labs Lab 02/14/17 0727 02/20/17 0627  NA 143 137  K 4.2 3.9  CL 104 101  CO2 28 27  GLUCOSE 127* 131*  BUN 13 15  CREATININE 1.13 1.22  CALCIUM 9.1 8.5*   GFR: Estimated Creatinine Clearance: 58.7 mL/min (by C-G formula based on SCr of 1.22 mg/dL). Liver Function Tests:  Recent Labs Lab 02/20/17 0627  AST 22  ALT 25  ALKPHOS 45  BILITOT 0.7  PROT 6.3*  ALBUMIN 3.7   No results for input(s): LIPASE, AMYLASE in the last 168 hours. No results for input(s): AMMONIA in the last 168 hours. Coagulation Profile: No results for input(s): INR, PROTIME in the last 168  hours. Cardiac Enzymes: No results for input(s): CKTOTAL, CKMB, CKMBINDEX, TROPONINI in the last 168 hours. BNP (last 3 results) No results for input(s): PROBNP in the last 8760 hours. HbA1C: No results for input(s): HGBA1C in the last 72 hours. CBG: No results for input(s): GLUCAP in the last 168 hours. Lipid Profile: No results for input(s): CHOL, HDL, LDLCALC, TRIG, CHOLHDL, LDLDIRECT in the last 72 hours. Thyroid Function Tests: No results for input(s): TSH, T4TOTAL, FREET4, T3FREE, THYROIDAB in the last 72 hours. Anemia Panel: No results for input(s): VITAMINB12, FOLATE, FERRITIN, TIBC, IRON, RETICCTPCT in the last 72 hours. Urine analysis:    Component Value Date/Time   COLORURINE yellow 09/25/2008 0829   APPEARANCEUR Clear 09/25/2008 0829   LABSPEC <1.005 09/25/2008 0829   PHURINE 6.0 09/25/2008 0829   HGBUR negative 09/25/2008 0829   BILIRUBINUR n 03/14/2014 1022   PROTEINUR n 03/14/2014 1022   UROBILINOGEN 0.2 03/14/2014 1022   UROBILINOGEN 0.2 09/25/2008 0829   NITRITE n 03/14/2014 1022   NITRITE negative 09/25/2008 0829   LEUKOCYTESUR Negative 03/14/2014 1022   Sepsis Labs: @LABRCNTIP (procalcitonin:4,lacticidven:4) )No results found for this or any previous visit (from the past 240 hour(s)).   Radiological Exams on Admission: No results found.  EKG: (Independently reviewed) sinus rhythm with ventricular rate 76 bpm, QTC 476 ms, normal R-wave rotation, no acute ischemic changes and unchanged from previous  Assessment/Plan Principal Problem:   Hematochezia -Presents with 4 episodes of hematochezia within 2 weeks of having extensive polypectomy with colonoscopy and after resumption of DAPT postprocedure -Patient had episode of seated hypotension (66/41) with blood pressure improving after 500 mL NS while in triage -Currently not anemic although hemoglobin slightly lower than baseline (15.1>14.6>13.5) -Repeat CBC at 12 PM and if remains stable can repeat in a.m.  -if bleeding recurs repeat at that time -Discussed with GI/Schooler (also patient's primary GI) who reports plan is to observe for recurrent bleeding but no indications at this juncture to pursue endoscopic evaluation at this juncture -Hold ASA/Plavix -NS IVF-initially at 100/hr 6 hours then decrease to 50/hr  Active Problems:   Coronary artery disease/DES to  RCA (2016) -Holding DAPT for now-if bleeding recurs or if concerns about when to resume expressed by GI may need to consult cardiology for input -Currently asymptomatic with no chest pain and EKG unremarkable -Due to episode of hypotension in triage will hold beta blocker -Echocardiogram 2015: Normal LVEF with grade 1 diastolic dysfunction-LV on LHC (2016) was 55%    Hypertension -Hold losartan with HCTZ given recent hypotension in setting of GI bleeding    Hyperlipidemia -Hold Zetia and Zocor until solid diet resumed    Prostatic cancer/BPH -Hold Flomax (suboptimal BP in setting of GI bleeding) -Has been evaluated by radiation oncology/[facility] regarding radiation therapy-tentative initiation of treatment to begin after October 12 since patient has a scheduled trip to [place]      DVT prophylaxis: SCDs Code Status: Full Family Communication: Wife  Disposition Plan: Home Consults called: Gastroenterology/Schooler     Samella Parr ANP-BC [facility] Pager [phone]   If 7PM-7AM, please contact night-coverage [email or web address] Password [email or web address]  02/20/2017, 9:49 AM

## 2017-02-20 NOTE — Progress Notes (Signed)
Admission note:  Arrival Method: via stretcher from ED Mental Orientation: A&Ox4 Telemetry: box 14, NSR.  Assessment: seed oc flow sheet Skin: warm, dry and intact. IV: R hand with NS @ 75ml/hr. L AC NSL. Pain: denies Tubes: none Safety Measures: discussed and reviewed with pt, verbal;zied understanding. Fall Prevention Safety Plan: reviewed with pt, call bell and pbelongings within reach.  Admission Screening: completed 6700 Orientation: Patient has been oriented to the unit, staff and to the room.

## 2017-02-20 NOTE — Consult Note (Signed)
Referring Provider: Dr. Dayna Barker Primary Care Physician:  Lucretia Kern, DO Primary Gastroenterologist:  Dr. Michail Sermon  Reason for Consultation:  Rectal bleed  HPI: Carl Harrell is a 71 y.o. male with acute onset of dark red blood per rectum yesterday X 4 with the first episode having stool and the other episodes just blood. Minimal abdominal pain in his RLQ and LLQ prior to admit but not now. Denies dizziness and denies melena. He is s/p a colonoscopy on 02/11/17 where 10 polyps ranging from 4 mm - 12 mm were removed with snare cautery (from sigmoid, descending colon, transverse colon, ascending and cecum). A few nonbleeding AVMs also seen in cecum. Sigmoid diverticulosis and internal hemorrhoids also noted. Plavix was held 1 week prior to the colonoscopy and resumed 3 days after the colonoscopy. He also is on Aspirin. He had a coronary stent placed in 2016. Reports doing heavy lifting yesterday lifting "300 pound logs" building something in his backyard and he thinks he overdid it. Also, has been eating a lot of fatty foods this weekend.   Past Medical History:  Diagnosis Date  . ALLERGIC RHINITIS 01/30/2007  . Arthritis    "hands" (05/29/2014)  . Coronary artery disease    LHC (05/29/14): pLAD 50-70, pRCA 99 (L>R collats), EF 55% >> PCI: 3.5 x 28 mm Xience Alpine DES to [employer]  . Cough secondary to angiotensin converting enzyme inhibitor (ACE-I) 05/28/2013  . ED (erectile dysfunction)   . GERD 09/26/2007  . HYPERLIPIDEMIA 09/26/2007  . Hypertension   . PLANTAR FASCIITIS, BILATERAL 07/08/2009  . Unspecified hearing loss 06/16/2009   wears aides both ears    Past Surgical History:  Procedure Laterality Date  . CARDIAC CATHETERIZATION  05/29/2014   Procedure: CORONARY STENT INTERVENTION;  Surgeon: Sinclair Grooms, MD;  Location: [facility] CATH LAB;  Service: Cardiovascular;;  Prox RCA  . CORONARY ANGIOPLASTY WITH STENT PLACEMENT  05/29/2014   "1"  . KNEE ARTHROSCOPY Right ~ 2005  . LEFT HEART CATHETERIZATION  WITH CORONARY ANGIOGRAM N/A 05/29/2014   Procedure: LEFT HEART CATHETERIZATION WITH CORONARY ANGIOGRAM;  Surgeon: Sinclair Grooms, MD;  Location: [facility] CATH LAB;  Service: Cardiovascular;  Laterality: N/A;    Prior to Admission medications   Medication Sig Start Date End Date Taking? Authorizing Provider  aspirin 81 MG chewable tablet Chew 1 tablet (81 mg total) by mouth daily. 05/30/14   Brett Canales, PA-C  clopidogrel (PLAVIX) 75 MG tablet  11/29/16   [provider]  ezetimibe (ZETIA) 10 MG tablet Take 1 tablet (10 mg total) by mouth daily. 05/03/16   Sueanne Margarita, MD  fexofenadine (ALLEGRA) 180 MG tablet Take 180 mg by mouth daily as needed for allergies or rhinitis.    [provider]  losartan-hydrochlorothiazide (HYZAAR) 50-12.5 MG tablet Take 1 tablet by mouth daily. 05/04/16   Lucretia Kern, DO  metoprolol tartrate (LOPRESSOR) 25 MG tablet take 1 tablet by mouth twice a day 03/23/16   Dorena Cookey, MD  Multiple Vitamin (MULTIVITAMIN WITH MINERALS) TABS tablet Take 1 tablet by mouth daily.    [provider]  NITROSTAT 0.4 MG SL tablet PLACE 1 TABLET BY MOUTH UNDER TONGUE EVERY 5 MINUTES AS NEEDED FOR CHEST PAIN 07/15/16   Sueanne Margarita, MD  omeprazole (PRILOSEC) 20 MG capsule take 1 capsule by mouth twice a day 06/28/16   Dorena Cookey, MD  simvastatin (ZOCOR) 40 MG tablet take 1 tablet by mouth at bedtime 03/23/16  Dorena Cookey, MD  TAMSULOSIN HCL PO Take by mouth.    [provider]    Scheduled Meds: Continuous Infusions: . sodium chloride     Followed by  . sodium chloride     PRN Meds:.  Allergies as of 02/20/2017 - Review Complete 02/20/2017  Allergen Reaction Noted  . Lisinopril Cough 01/16/2016    Family History  Problem Relation Age of Onset  . Heart attack Mother   . Heart disease Mother   . Heart attack Father   . Heart disease Father   . COPD Neg Hx        family hx  . Hyperlipidemia Neg Hx        family hx     Social History   Social History  . Marital status: Married    Spouse name: N/A  . Number of children: N/A  . Years of education: N/A   Occupational History  . Not on file.   Social History Main Topics  . Smoking status: Former Smoker    Types: Cigarettes    Quit date: 05/24/1978  . Smokeless tobacco: Never Used     Comment: 05/29/2014 "smoked socially; nothing since 1980"  . Alcohol use 1.2 oz/week    2 Shots of liquor per week     Comment: beer or two a week  . Drug use: No  . Sexual activity: Not Currently   Other Topics Concern  . Not on file   Social History Narrative   Lives with wife in a one story home.  Has 2 children.  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  Education: 3 years of college.    Does wood working.     Review of Systems: All negative except as stated above in HPI.  Physical Exam: Vital signs: Vitals:   02/20/17 0915 02/20/17 0930  BP: 130/85 (!) 129/115  Pulse: 67 83  Resp: 14 17  Temp:    SpO2: 97% 97%  T 98.7   General:  Lethargic, Well-developed, well-nourished, pleasant and cooperative in NAD Head: normocephalic, atraumatic Eyes: anicteric sclera ENT: oropharynx clear Neck: supple, nontender Lungs:  Clear throughout to auscultation.   No wheezes, crackles, or rhonchi. No acute distress. Heart:  Regular rate and rhythm; no murmurs, clicks, rubs,  or gallops. Abdomen: soft, nontender, nondistended, +BS  Rectal:  Deferred Ext: no edema  GI:  Lab Results:  Recent Labs  02/20/17 0627  WBC 10.0  HGB 13.5  HCT 42.4  PLT 256   BMET  Recent Labs  02/20/17 0627  NA 137  K 3.9  CL 101  CO2 27  GLUCOSE 131*  BUN 15  CREATININE 1.22  CALCIUM 8.5*   LFT  Recent Labs  02/20/17 0627  PROT 6.3*  ALBUMIN 3.7  AST 22  ALT 25  ALKPHOS 45  BILITOT 0.7   PT/INR No results for input(s): LABPROT, INR in the last 72 hours.   Studies/Results: No results found.  Impression/Plan: GI bleed likely polypectomy bleed in the setting of  Aspirin/Plavix. Manage conservatively and place on observation overnight. Hold Aspirin/Plavix. If bleeding persists, then may need a repeat colonoscopy tomorrow to look for a bleeding polypectomy site. Clear liquid diet. [facility] will f/u tomorrow.     LOS: 0 days   Erin C.  02/20/2017, 9:51 AM  Pager [phone]  AFTER 5 pm or on weekends please call [phone]

## 2017-02-21 DIAGNOSIS — I2510 Atherosclerotic heart disease of native coronary artery without angina pectoris: Secondary | ICD-10-CM | POA: Diagnosis not present

## 2017-02-21 DIAGNOSIS — C61 Malignant neoplasm of prostate: Secondary | ICD-10-CM | POA: Diagnosis not present

## 2017-02-21 DIAGNOSIS — K921 Melena: Secondary | ICD-10-CM | POA: Diagnosis not present

## 2017-02-21 DIAGNOSIS — I10 Essential (primary) hypertension: Secondary | ICD-10-CM | POA: Diagnosis not present

## 2017-02-21 LAB — CBC
HCT: 38.4 % — ABNORMAL LOW (ref 39.0–52.0)
Hemoglobin: 12.2 g/dL — ABNORMAL LOW (ref 13.0–17.0)
MCH: 27.1 pg (ref 26.0–34.0)
MCHC: 31.8 g/dL (ref 30.0–36.0)
MCV: 85.1 fL (ref 78.0–100.0)
PLATELETS: 243 10*3/uL (ref 150–400)
RBC: 4.51 MIL/uL (ref 4.22–5.81)
RDW: 13.9 % (ref 11.5–15.5)
WBC: 8.5 10*3/uL (ref 4.0–10.5)

## 2017-02-21 LAB — BASIC METABOLIC PANEL
ANION GAP: 8 (ref 5–15)
BUN: 9 mg/dL (ref 6–20)
CO2: 24 mmol/L (ref 22–32)
Calcium: 8.1 mg/dL — ABNORMAL LOW (ref 8.9–10.3)
Chloride: 108 mmol/L (ref 101–111)
Creatinine, Ser: 1.12 mg/dL (ref 0.61–1.24)
GFR calc Af Amer: >60 mL/min (ref >60)
Glucose, Bld: 108 mg/dL — ABNORMAL HIGH (ref 65–99)
POTASSIUM: 3.6 mmol/L (ref 3.5–5.1)
SODIUM: 140 mmol/L (ref 135–145)

## 2017-02-21 MED ORDER — TAMSULOSIN HCL 0.4 MG PO CAPS
0.4000 mg | ORAL_CAPSULE | Freq: Every day | ORAL | Status: DC
  Administered 2017-02-21: 0.4 mg via ORAL
  Filled 2017-02-21: qty 1

## 2017-02-21 MED ORDER — POLYETHYLENE GLYCOL 3350 17 G PO PACK
17.0000 g | PACK | Freq: Three times a day (TID) | ORAL | Status: DC
  Administered 2017-02-21 – 2017-02-22 (×3): 17 g via ORAL
  Filled 2017-02-21 (×3): qty 1

## 2017-02-21 MED ORDER — PANTOPRAZOLE SODIUM 40 MG PO TBEC
40.0000 mg | DELAYED_RELEASE_TABLET | Freq: Every day | ORAL | Status: DC
  Administered 2017-02-21 – 2017-02-22 (×2): 40 mg via ORAL
  Filled 2017-02-21 (×2): qty 1

## 2017-02-21 NOTE — Plan of Care (Signed)
Problem: Physical Regulation: Goal: Ability to maintain clinical measurements within normal limits will improve Outcome: Progressing Hemoglobin stable. Patient aware of need to continue monitoring hemoglobin and stool. Will ambulate patient in hallway.

## 2017-02-21 NOTE — Care Management Obs Status (Signed)
[facility] NOTIFICATION   Patient Details  Name: LEANDREW KEECH MRN: [id number] Date of Birth: 23-Apr-1946   Medicare Observation Status Notification Given:  Yes    Joriel Streety, Rory Percy, RN 02/21/2017, 1:33 PM

## 2017-02-21 NOTE — Progress Notes (Signed)
[facility] PROGRESS NOTE Subjective Patient is 10 days past colonoscopy and polypectomy was admitted with a polypectomy bleed. His hemoglobin is drop 12.2 had no further stools during the night.  Objective: Vital signs in last 24 hours: Temp:  [97.9 F (36.6 C)-98.4 F (36.9 C)] 98.4 F (36.9 C) (10/01 0937) Pulse Rate:  [67-79] 79 (10/01 0937) Resp:  [15-18] 18 (10/01 0937) BP: (115-142)/(70-84) 142/84 (10/01 0937) SpO2:  [96 %-98 %] 97 % (10/01 0937) Weight:  [92.5 kg (203 lb 14.8 oz)] 92.5 kg (203 lb 14.8 oz) (09/30 1900) Last BM Date: 02/21/17  Intake/Output from previous day: 09/30 0701 - 10/01 0700 In: 1148.3 [I.V.:1148.3] Out: 4315 [Urine:1550] Intake/Output this shift: Total I/O In: 243 [P.O.:240; I.V.:3] Out: 800 [Urine:800]  PE:  Abdomen--soft and nontender  Lab Results:  Recent Labs  02/20/17 0627 02/20/17 1215 02/21/17 0300  WBC 10.0 9.8 8.5  HGB 13.5 12.9* 12.2*  HCT 42.4 40.5 38.4*  PLT 256 263 243   BMET  Recent Labs  02/20/17 0627 02/21/17 0300  NA 137 140  K 3.9 3.6  CL 101 108  CO2 27 24  CREATININE 1.22 1.12   LFT  Recent Labs  02/20/17 0627  PROT 6.3*  AST 22  ALT 25  ALKPHOS 45  BILITOT 0.7   PT/INR No results for input(s): LABPROT, INR in the last 72 hours. PANCREAS No results for input(s): LIPASE in the last 72 hours.       Studies/Results: No results found.  Medications: I have reviewed the patient's current medications.  Assessment:   1. Post polypectomy bleed. Appear stable   Plan:  Will add Miralax and keep on clear liquids. Would check a hemoglobin in the morning and is stable he can go home avoid aspirin Plavix for at least 3 more days and would remain on Miralax for a week in order to keep his stool soft. It also encourage low residue diet for several days. Have discussed this detail with the patient and his wife.   Melrose Kearse JR,Amarria Andreasen L 02/21/2017, 1:10 PM  This note was created using  voice recognition software. Minor errors may Have occurred unintentionally.  Pager: [phone] If no answer or after hours call [phone]

## 2017-02-21 NOTE — Progress Notes (Signed)
PROGRESS NOTE    Carl Harrell  DGU:[id number] DOB: [birth year] DOA: 02/20/2017 PCP: Lucretia Kern, DO   Outpatient Specialists:     Brief Narrative:  71 year old male with CAD on aspirin and Plavix, recently diagnosed prostate cancer and colonoscopy on 9/21 with multiple polyps removed presented with 4 episodes of rectal bleed (first episode was associated with bowel movement, remaining were bright red blood per rectum).   Assessment & Plan:   Principal Problem:   Hematochezia Active Problems:   Coronary artery disease   Hypertension   Hyperlipidemia   Prostatic cancer ([place])   Hematochezia -after polypectomy with colonoscopy and resumption of DAPT post-procedure (wife not sure he waited the recommended time period before resuming) -Patient had episode of seated hypotension (66/41) with blood pressure improving after 500 mL NS while in triage -h/h stable -Discussed with GI/Schooler (also patient's primary GI) who reports plan is to observe for recurrent bleeding but no indications at this juncture to pursue endoscopic evaluation at this juncture -Hold ASA/Plavix     Coronary artery disease/DES to RCA (2016) -Holding DAPT for now-if bleeding recurs or if concerns about when to resume expressed by GI may need to consult cardiology for input -Currently asymptomatic with no chest pain and EKG unremarkable -Echocardiogram 2015: Normal LVEF with grade 1 diastolic dysfunction-LV on LHC (2016) was 55%    Hypertension -Hold losartan with HCTZ given recent hypotension in setting of GI bleeding    Hyperlipidemia -Hold Zetia and Zocor until solid diet resumed    Prostatic cancer/BPH -resume flomax -Has been evaluated by radiation oncology/[facility]ning regarding radiation therapy-tentative initiation of treatment to begin after October 12 since patient has a scheduled trip to [place]      DVT prophylaxis:  SCD's  Code Status: Full Code   Family  Communication: wife  Disposition Plan:  Anxious to be d/c'd as he leaves for train trip   Consultants:     Procedures:      Subjective: Anxious to go home-- small BM earlier this AM-- some dark blood  Objective: Vitals:   02/20/17 1500 02/20/17 1530 02/20/17 1900 02/21/17 0937  BP: 123/70 115/71 125/74 (!) 142/84  Pulse: 78 67 79 79  Resp: 16 16 18 18   Temp:   97.9 F (36.6 C) 98.4 F (36.9 C)  TempSrc:   Oral Oral  SpO2: 96% 98% 96% 97%  Weight:   92.5 kg (203 lb 14.8 oz)   Height:        Intake/Output Summary (Last 24 hours) at 02/21/17 1045 Last data filed at 02/21/17 1013  Gross per 24 hour  Intake          1151.33 ml  Output             2350 ml  Net         -1198.67 ml   Filed Weights   02/20/17 0624 02/20/17 1900  Weight: 93 kg (205 lb) 92.5 kg (203 lb 14.8 oz)    Examination:  General exam: Appears calm and comfortable  Respiratory system: Clear to auscultation. Respiratory effort normal. Cardiovascular system: S1 & S2 heard, RRR. No JVD, murmurs, rubs, gallops or clicks. No pedal edema. Gastrointestinal system: Abdomen is nondistended, soft and nontender. No organomegaly or masses felt. Normal bowel sounds heard. Central nervous system: Alert and oriented. No focal neurological deficits. Extremities: Symmetric 5 x 5 power. Skin: No rashes, lesions or ulcers Psychiatry: Judgement and insight appear normal. Mood & affect appropriate.  Data Reviewed: I have personally reviewed following labs and imaging studies  CBC:  Recent Labs Lab 02/20/17 0627 02/20/17 1215 02/21/17 0300  WBC 10.0 9.8 8.5  HGB 13.5 12.9* 12.2*  HCT 42.4 40.5 38.4*  MCV 86.0 85.1 85.1  PLT 256 263 771   Basic Metabolic Panel:  Recent Labs Lab 02/20/17 0627 02/21/17 0300  NA 137 140  K 3.9 3.6  CL 101 108  CO2 27 24  GLUCOSE 131* 108*  BUN 15 9  CREATININE 1.22 1.12  CALCIUM 8.5* 8.1*   GFR: Estimated Creatinine Clearance: 63.7 mL/min (by C-G formula  based on SCr of 1.12 mg/dL). Liver Function Tests:  Recent Labs Lab 02/20/17 0627  AST 22  ALT 25  ALKPHOS 45  BILITOT 0.7  PROT 6.3*  ALBUMIN 3.7   No results for input(s): LIPASE, AMYLASE in the last 168 hours. No results for input(s): AMMONIA in the last 168 hours. Coagulation Profile: No results for input(s): INR, PROTIME in the last 168 hours. Cardiac Enzymes: No results for input(s): CKTOTAL, CKMB, CKMBINDEX, TROPONINI in the last 168 hours. BNP (last 3 results) No results for input(s): PROBNP in the last 8760 hours. HbA1C: No results for input(s): HGBA1C in the last 72 hours. CBG: No results for input(s): GLUCAP in the last 168 hours. Lipid Profile: No results for input(s): CHOL, HDL, LDLCALC, TRIG, CHOLHDL, LDLDIRECT in the last 72 hours. Thyroid Function Tests: No results for input(s): TSH, T4TOTAL, FREET4, T3FREE, THYROIDAB in the last 72 hours. Anemia Panel:  Recent Labs  02/20/17 0926 02/20/17 1005  VITAMINB12  --  424  FOLATE 21.6  --   FERRITIN  --  91  TIBC  --  248*  IRON  --  80  RETICCTPCT  --  1.0   Urine analysis:    Component Value Date/Time   COLORURINE yellow 09/25/2008 0829   APPEARANCEUR Clear 09/25/2008 0829   LABSPEC <1.005 09/25/2008 0829   PHURINE 6.0 09/25/2008 0829   HGBUR negative 09/25/2008 0829   BILIRUBINUR n 03/14/2014 1022   PROTEINUR n 03/14/2014 1022   UROBILINOGEN 0.2 03/14/2014 1022   UROBILINOGEN 0.2 09/25/2008 0829   NITRITE n 03/14/2014 1022   NITRITE negative 09/25/2008 0829   LEUKOCYTESUR Negative 03/14/2014 1022     )No results found for this or any previous visit (from the past 240 hour(s)).    Anti-infectives    None       Radiology Studies: No results found.      Scheduled Meds: . sodium chloride flush  3 mL Intravenous Q12H   Continuous Infusions: . sodium chloride 50 mL/hr at 02/20/17 2343     LOS: 0 days    Time spent: 25 min    Miller, DO [facility] Pager  [phone]  If 7PM-7AM, please contact night-coverage [email or web address] Password [email or web address] 02/21/2017, 10:45 AM

## 2017-02-21 NOTE — Plan of Care (Signed)
Problem: Education: Goal: Knowledge of [facility] Education information/materials will improve Outcome: Progressing POC reviewed with pt.   

## 2017-02-22 ENCOUNTER — Encounter (HOSPITAL_COMMUNITY): Payer: Self-pay | Admitting: *Deleted

## 2017-02-22 DIAGNOSIS — I10 Essential (primary) hypertension: Secondary | ICD-10-CM

## 2017-02-22 DIAGNOSIS — K921 Melena: Secondary | ICD-10-CM | POA: Diagnosis not present

## 2017-02-22 DIAGNOSIS — C61 Malignant neoplasm of prostate: Secondary | ICD-10-CM | POA: Diagnosis not present

## 2017-02-22 DIAGNOSIS — I2510 Atherosclerotic heart disease of native coronary artery without angina pectoris: Secondary | ICD-10-CM | POA: Diagnosis not present

## 2017-02-22 LAB — CBC
HEMATOCRIT: 39.2 % (ref 39.0–52.0)
HEMOGLOBIN: 12.8 g/dL — AB (ref 13.0–17.0)
MCH: 27.9 pg (ref 26.0–34.0)
MCHC: 32.7 g/dL (ref 30.0–36.0)
MCV: 85.4 fL (ref 78.0–100.0)
Platelets: 259 10*3/uL (ref 150–400)
RBC: 4.59 MIL/uL (ref 4.22–5.81)
RDW: 14.2 % (ref 11.5–15.5)
WBC: 8.5 10*3/uL (ref 4.0–10.5)

## 2017-02-22 MED ORDER — LORATADINE 10 MG PO TABS
10.0000 mg | ORAL_TABLET | Freq: Every day | ORAL | Status: DC
  Administered 2017-02-22: 10 mg via ORAL
  Filled 2017-02-22: qty 1

## 2017-02-22 MED ORDER — POLYETHYLENE GLYCOL 3350 17 G PO PACK: 17.0000 g | PACK | Freq: Three times a day (TID) | ORAL | 0 refills | Status: DC

## 2017-02-22 NOTE — Discharge Summary (Signed)
Physician Discharge Summary  Carl Harrell HBZ:[id number] DOB: December 06, 1945 DOA: 02/20/2017  PCP: Lucretia Kern, DO  Admit date: 02/20/2017 Discharge date: 02/22/2017   Recommendations for Outpatient Follow-Up:   Low residue diet x 5 days then resume previous diet Hold ASA/plavix x 3 more days Cbc 1 week Outpatient GI follow up Bowel regimen  Discharge Diagnosis:   Principal Problem:   Hematochezia Active Problems:   Coronary artery disease   Hypertension   Hyperlipidemia   Prostatic cancer [facility])   Discharge disposition:  Home.    Discharge Condition: Improved.  Diet recommendation: see bave  Wound care: None.   History of Present Illness:   Carl Harrell is a 71 y.o. male with medical history significant for CAD status post DES to RCA in 2016 on DAPT, hypertension, dyslipidemia, BPH and recent diagnosis of prostate cancer. Patient presents today ER after complaining of having bloody stools that were thick like pudding that filled the entire toilet bowl-episodes began around 4 PM on 9/29 and he said 4 stools total. He has recently received a flu shot. 2 weeks ago he underwent colonoscopy and polypectomy (total of 10 polyps). DAPT held prior to procedure and resumed shortly thereafter. Patient has not had any nausea, vomiting or abdominal pain. He has not had any chest pain or shortness of breath.  Hospital Course by Problem:   Hematochezia -after polypectomy with colonoscopy and resumption of DAPTpost-procedure (wife not sure he waited the recommended time period before resuming) -Patient had episode of seated hypotension (66/41)with blood pressure improving after 500 mL NSwhile in triage -h/h stable -Hold ASA/Plavix for 3 more days  Coronary artery disease/DES to RCA (2016) -Currently asymptomatic with no chest pain and EKG unremarkable -Echocardiogram 2015: Normal LVEF with grade 1 diastolic dysfunction-LV on LHC (2016)was 55%  Hypertension -resume  meds as tolerated  Hyperlipidemia -resume as tolerated  Prostatic cancer/BPH -resume flomax -Has been evaluated by radiation oncology/[facility]arding radiation therapy-tentative initiation of treatment to begin after October 12 since patient has a scheduled trip to [place] [facility]:    GI   Discharge Exam:   Vitals:   02/21/17 2022 02/22/17 0554  BP: 113/76 117/81  Pulse: 71 66  Resp: 18 18  Temp: 97.6 F (36.4 C) 98.6 F (37 C)  SpO2: 96% 96%   Vitals:   02/21/17 0937 02/21/17 1847 02/21/17 2022 02/22/17 0554  BP: (!) 142/84 131/81 113/76 117/81  Pulse: 79 71 71 66  Resp: 18 18 18 18   Temp: 98.4 F (36.9 C) 98 F (36.7 C) 97.6 F (36.4 C) 98.6 F (37 C)  TempSrc: Oral Oral Oral Oral  SpO2: 97% 97% 96% 96%  Weight:   92.5 kg (204 lb)   Height:        Gen:  NAD    The results of significant diagnostics from this hospitalization (including imaging, microbiology, ancillary and laboratory) are listed below for reference.     Procedures and Diagnostic Studies:   No results found.   Labs:   Basic Metabolic Panel:  Recent Labs Lab 02/20/17 0627 02/21/17 0300  NA 137 140  K 3.9 3.6  CL 101 108  CO2 27 24  GLUCOSE 131* 108*  BUN 15 9  CREATININE 1.22 1.12  CALCIUM 8.5* 8.1*   GFR Estimated Creatinine Clearance: 63.7 mL/min (by C-G formula based on SCr of 1.12 mg/dL). Liver Function Tests:  Recent Labs Lab 02/20/17 0627  AST 22  ALT 25  ALKPHOS 45  BILITOT 0.7  PROT 6.3*  ALBUMIN 3.7   No results for input(s): LIPASE, AMYLASE in the last 168 hours. No results for input(s): AMMONIA in the last 168 hours. Coagulation profile No results for input(s): INR, PROTIME in the last 168 hours.  CBC:  Recent Labs Lab 02/20/17 0627 02/20/17 1215 02/21/17 0300 02/22/17 0329  WBC 10.0 9.8 8.5 8.5  HGB 13.5 12.9* 12.2* 12.8*  HCT 42.4 40.5 38.4* 39.2  MCV 86.0 85.1 85.1 85.4  PLT 256 263 243 259   Cardiac  Enzymes: No results for input(s): CKTOTAL, CKMB, CKMBINDEX, TROPONINI in the last 168 hours. BNP: Invalid input(s): POCBNP CBG: No results for input(s): GLUCAP in the last 168 hours. D-Dimer No results for input(s): DDIMER in the last 72 hours. Hgb A1c No results for input(s): HGBA1C in the last 72 hours. Lipid Profile No results for input(s): CHOL, HDL, LDLCALC, TRIG, CHOLHDL, LDLDIRECT in the last 72 hours. Thyroid function studies No results for input(s): TSH, T4TOTAL, T3FREE, THYROIDAB in the last 72 hours.  Invalid input(s): FREET3 Anemia work up  Recent Labs  02/20/17 0926 02/20/17 1005  VITAMINB12  --  424  FOLATE 21.6  --   FERRITIN  --  91  TIBC  --  248*  IRON  --  80  RETICCTPCT  --  1.0   Microbiology No results found for this or any previous visit (from the past 240 hour(s)).   Discharge Instructions:   Discharge Instructions    Diet - low sodium heart healthy    Complete by:  As directed    Discharge instructions    Complete by:  As directed    miralax to get stools to toothpaste consistency Hold ASA/plavix until 10/5 can resume then Low residue diet (soft) x 7 days   Increase activity slowly    Complete by:  As directed      Allergies as of 02/22/2017      Reactions   Lisinopril Cough      Medication List    STOP taking these medications   aspirin 81 MG chewable tablet   clopidogrel 75 MG tablet Commonly known as:  PLAVIX   losartan-hydrochlorothiazide 50-12.5 MG tablet Commonly known as:  HYZAAR     TAKE these medications   ezetimibe 10 MG tablet Commonly known as:  ZETIA Take 1 tablet (10 mg total) by mouth daily.   fexofenadine 180 MG tablet Commonly known as:  ALLEGRA Take 180 mg by mouth daily.   loratadine 10 MG tablet Commonly known as:  CLARITIN Take 10 mg by mouth daily.   metoprolol tartrate 25 MG tablet Commonly known as:  LOPRESSOR take 1 tablet by mouth twice a day What changed:  See the new instructions.     multivitamin with minerals Tabs tablet Take 1 tablet by mouth daily.   NITROSTAT 0.4 MG SL tablet Generic drug:  nitroGLYCERIN PLACE 1 TABLET BY MOUTH UNDER TONGUE EVERY 5 MINUTES AS NEEDED FOR CHEST PAIN   omeprazole 20 MG capsule Commonly known as:  PRILOSEC take 1 capsule by mouth twice a day What changed:  See the new instructions.   polyethylene glycol packet Commonly known as:  MIRALAX / GLYCOLAX Take 17 g by mouth 3 (three) times daily.   simvastatin 40 MG tablet Commonly known as:  ZOCOR take 1 tablet by mouth at bedtime   tamsulosin 0.4 MG Caps capsule Commonly known as:  FLOMAX Take 0.4 mg by mouth at bedtime.  Follow-up Information    Lucretia Kern, DO Follow up in 1 week(s).   Specialty:  Family Medicine Contact information: [address] [place] [address] [phone]            Time coordinating discharge: 35 min  Signed:  Merrissa Giacobbe Alison Stalling   [facility] 02/22/2017, 8:50 AM

## 2017-02-22 NOTE — Discharge Summary (Signed)
Pt given discharge instructions, prescriptions, and follow up info. Denies questions. Wife to pick up patient.

## 2017-02-22 NOTE — Progress Notes (Signed)
GI Discharge/Sign-Off Note  Subjective: patient doing well with no further signs of G.I. Bleeding. Hemoglobin stable stools clearing  Principal Problem:   Hematochezia Active Problems:   Coronary artery disease   Hypertension   Hyperlipidemia   Prostatic cancer ([place])   Results for orders placed or performed during the hospital encounter of 02/20/17 (from the past 72 hour(s))  Type and screen [place]     Status: None   Collection Time: 02/20/17  6:20 AM  Result Value Ref Range   ABO/RH(D) O POS    Antibody Screen NEG    Sample Expiration 02/23/2017   ABO/Rh     Status: None   Collection Time: 02/20/17  6:20 AM  Result Value Ref Range   ABO/RH(D) O POS   Comprehensive metabolic panel     Status: Abnormal   Collection Time: 02/20/17  6:27 AM  Result Value Ref Range   Sodium 137 135 - 145 mmol/L   Potassium 3.9 3.5 - 5.1 mmol/L   Chloride 101 101 - 111 mmol/L   CO2 27 22 - 32 mmol/L   Glucose, Bld 131 (H) 65 - 99 mg/dL   BUN 15 6 - 20 mg/dL   Creatinine, Ser 1.22 0.61 - 1.24 mg/dL   Calcium 8.5 (L) 8.9 - 10.3 mg/dL   Total Protein 6.3 (L) 6.5 - 8.1 g/dL   Albumin 3.7 3.5 - 5.0 g/dL   AST 22 15 - 41 U/L   ALT 25 17 - 63 U/L   Alkaline Phosphatase 45 38 - 126 U/L   Total Bilirubin 0.7 0.3 - 1.2 mg/dL   GFR calc non Af Amer 57 (L) >60 mL/min   GFR calc Af Amer >60 >60 mL/min    Comment: (NOTE) The eGFR has been calculated using the CKD EPI equation. This calculation has not been validated in all clinical situations. eGFR's persistently <60 mL/min signify possible Chronic Kidney Disease.    Anion gap 9 5 - 15  CBC     Status: None   Collection Time: 02/20/17  6:27 AM  Result Value Ref Range   WBC 10.0 4.0 - 10.5 K/uL   RBC 4.93 4.22 - 5.81 MIL/uL   Hemoglobin 13.5 13.0 - 17.0 g/dL   HCT 42.4 39.0 - 52.0 %   MCV 86.0 78.0 - 100.0 fL   MCH 27.4 26.0 - 34.0 pg   MCHC 31.8 30.0 - 36.0 g/dL   RDW 13.9 11.5 - 15.5 %   Platelets 256 150 - 400 K/uL   POC occult blood, ED     Status: Abnormal   Collection Time: 02/20/17  7:47 AM  Result Value Ref Range   Fecal Occult Bld POSITIVE (A) NEGATIVE  Folate     Status: None   Collection Time: 02/20/17  9:26 AM  Result Value Ref Range   Folate 21.6 >5.9 ng/mL  Vitamin B12     Status: None   Collection Time: 02/20/17 10:05 AM  Result Value Ref Range   Vitamin B-12 424 180 - 914 pg/mL    Comment: (NOTE) This assay is not validated for testing neonatal or myeloproliferative syndrome specimens for Vitamin B12 levels.   Iron and TIBC     Status: Abnormal   Collection Time: 02/20/17 10:05 AM  Result Value Ref Range   Iron 80 45 - 182 ug/dL   TIBC 248 (L) 250 - 450 ug/dL   Saturation Ratios 32 17.9 - 39.5 %   UIBC 168 ug/dL  Ferritin  Status: None   Collection Time: 02/20/17 10:05 AM  Result Value Ref Range   Ferritin 91 24 - 336 ng/mL  Reticulocytes     Status: None   Collection Time: 02/20/17 10:05 AM  Result Value Ref Range   Retic Ct Pct 1.0 0.4 - 3.1 %   RBC. 4.63 4.22 - 5.81 MIL/uL   Retic Count, Absolute 46.3 19.0 - 186.0 K/uL  CBC     Status: Abnormal   Collection Time: 02/20/17 12:15 PM  Result Value Ref Range   WBC 9.8 4.0 - 10.5 K/uL   RBC 4.76 4.22 - 5.81 MIL/uL   Hemoglobin 12.9 (L) 13.0 - 17.0 g/dL   HCT 40.5 39.0 - 52.0 %   MCV 85.1 78.0 - 100.0 fL   MCH 27.1 26.0 - 34.0 pg   MCHC 31.9 30.0 - 36.0 g/dL   RDW 13.8 11.5 - 15.5 %   Platelets 263 150 - 400 K/uL  CBC     Status: Abnormal   Collection Time: 02/21/17  3:00 AM  Result Value Ref Range   WBC 8.5 4.0 - 10.5 K/uL   RBC 4.51 4.22 - 5.81 MIL/uL   Hemoglobin 12.2 (L) 13.0 - 17.0 g/dL   HCT 38.4 (L) 39.0 - 52.0 %   MCV 85.1 78.0 - 100.0 fL   MCH 27.1 26.0 - 34.0 pg   MCHC 31.8 30.0 - 36.0 g/dL   RDW 13.9 11.5 - 15.5 %   Platelets 243 150 - 400 K/uL  Basic metabolic panel     Status: Abnormal   Collection Time: 02/21/17  3:00 AM  Result Value Ref Range   Sodium 140 135 - 145 mmol/L   Potassium 3.6  3.5 - 5.1 mmol/L   Chloride 108 101 - 111 mmol/L   CO2 24 22 - 32 mmol/L   Glucose, Bld 108 (H) 65 - 99 mg/dL   BUN 9 6 - 20 mg/dL   Creatinine, Ser 1.12 0.61 - 1.24 mg/dL   Calcium 8.1 (L) 8.9 - 10.3 mg/dL   GFR calc non Af Amer >60 >60 mL/min   GFR calc Af Amer >60 >60 mL/min    Comment: (NOTE) The eGFR has been calculated using the CKD EPI equation. This calculation has not been validated in all clinical situations. eGFR's persistently <60 mL/min signify possible Chronic Kidney Disease.    Anion gap 8 5 - 15  CBC     Status: Abnormal   Collection Time: 02/22/17  3:29 AM  Result Value Ref Range   WBC 8.5 4.0 - 10.5 K/uL   RBC 4.59 4.22 - 5.81 MIL/uL   Hemoglobin 12.8 (L) 13.0 - 17.0 g/dL   HCT 39.2 39.0 - 52.0 %   MCV 85.4 78.0 - 100.0 fL   MCH 27.9 26.0 - 34.0 pg   MCHC 32.7 30.0 - 36.0 g/dL   RDW 14.2 11.5 - 15.5 %   Platelets 259 150 - 400 K/uL    No results found.  '@MEDSSCHEDLED'$ @  GI DISCHARGE PLANNING:  Diet:  Would discharge on low residue diet for 5 days and then resume regular diet  Anticoagulation/Antiplatelet Rx Suggestions: would hold aspirin Plavix for 3 more days  GI Medications: continue on omeprazole  Labs/Procedures Ordered:  GI FOLLOW UP:  Call [phone] to make appointment.   Doctor: Michail Sermon  Time:  would have him arrange an appointment in 4 to 6 weeks   Laurence Spates, MD   Pager [phone] If no answer or after hours call  [phone] 

## 2017-02-22 NOTE — Plan of Care (Signed)
Problem: Nutrition: Goal: Adequate nutrition will be maintained Outcome: Progressing Patient eating soft diet and tolerating well.

## 2017-02-23 ENCOUNTER — Ambulatory Visit: Payer: Medicare HMO | Admitting: Radiation Oncology

## 2017-02-23 ENCOUNTER — Telehealth: Payer: Self-pay | Admitting: *Deleted

## 2017-02-23 DIAGNOSIS — C61 Malignant neoplasm of prostate: Secondary | ICD-10-CM | POA: Diagnosis not present

## 2017-02-23 NOTE — Telephone Encounter (Signed)
Called patient to inform of sim appt. for 02-25-17 @ 8 am @ Dr. Johny Shears Office, lvm for a return call

## 2017-02-23 NOTE — Telephone Encounter (Signed)
Transition Care Management Follow-up Telephone Call  Per Discharge Summary: Admit date: 02/20/2017 Discharge date: 02/22/2017   Recommendations for Outpatient Follow-Up:   Low residue diet x 5 days then resume previous diet Hold ASA/plavix x 3 more days Cbc 1 week Outpatient GI follow up Bowel regimen  Discharge Diagnosis:   Principal Problem:   Hematochezia Active Problems:   Coronary artery disease   Hypertension   Hyperlipidemia   Prostatic cancer [facility])  Discharge disposition:  Home.    Discharge Condition: Improved.  Diet recommendation: see bave  Wound care: None.  --   How have you been since you were released from the hospital? "I've been fine. I just got back from the urologist who marked me for the prostate radiation treatment."   Do you understand why you were in the hospital? yes   Do you understand the discharge instructions? yes   Where were you discharged to? Home   Items Reviewed:  Medications reviewed: yes  Allergies reviewed: yes  Dietary changes reviewed: yes, soft diet  Referrals reviewed: no, none made   Functional Questionnaire:   Activities of Daily Living (ADLs):   He states they are independent in the following: ambulation, bathing and hygiene, feeding, continence, grooming, toileting and dressing States they require assistance with the following: none   Any transportation issues/concerns?: no   Any patient concerns? no   Confirmed importance and date/time of follow-up visits scheduled yes  Provider Appointment booked with Dr. Colin Benton 02/24/17 @ 1:30pm  Confirmed with patient if condition begins to worsen call PCP or go to the ER.  Patient was given the office number and encouraged to call back with question or concerns.  : yes

## 2017-02-24 ENCOUNTER — Ambulatory Visit (INDEPENDENT_AMBULATORY_CARE_PROVIDER_SITE_OTHER): Payer: Medicare HMO | Admitting: Family Medicine

## 2017-02-24 ENCOUNTER — Encounter: Payer: Self-pay | Admitting: Family Medicine

## 2017-02-24 VITALS — BP 118/80 | HR 73 | Temp 98.1°F | Ht 67.0 in | Wt 211.3 lb

## 2017-02-24 DIAGNOSIS — E1159 Type 2 diabetes mellitus with other circulatory complications: Secondary | ICD-10-CM

## 2017-02-24 DIAGNOSIS — I10 Essential (primary) hypertension: Secondary | ICD-10-CM

## 2017-02-24 DIAGNOSIS — K922 Gastrointestinal hemorrhage, unspecified: Secondary | ICD-10-CM | POA: Diagnosis not present

## 2017-02-24 DIAGNOSIS — C61 Malignant neoplasm of prostate: Secondary | ICD-10-CM

## 2017-02-24 NOTE — Patient Instructions (Signed)
BEFORE YOU LEAVE: -follow up: 1 month about blood pressure  Plan per discharge documents from the hospital physicians was to restart aspirin and plavix 3 days after discharge. Please call you gastroenterologist and cardiologist if any questions.  Monitor blood pressure at home and if running high let us know so can restart the hctz-losartan blood pressure medication.  Seek care immediatly if any further bleeding.

## 2017-02-24 NOTE — Progress Notes (Signed)
HPI:  Carl Harrell is a pleasant 71 y.o. with a PMH significant for coronary artery disease status post stenting in 2016, hypertension, hyperlipidemia, recent diagnosis of prostate cancer on Plavix here for a hospital follow up. See transitional care phone note in Epic. Per review of discharge documents and patient: Hospitalized 9/30 to 02/22/17 Primary admitting complaint(s):hematochezia following polypectomy with colonoscopy Primary admitting diagnosis (es) and treatment:hematochezia, GI bleed treated with fluids for hypotension and aspirin and Plavix were held, on discharge they recommended holding for 3 more days, low residue diet Other significant diagnosis (es) and treatment:none Follow up concerns per discharge document:CBC in 1 week and gastroenterology follow-up Reports today: doing well, reports normal bowel movements and no further bleeding or melanotic stools since discharge Denies:chest pain, shortness of breath, diarrhea, abnormal bowels, bleeding He was not aware of when to restart his aspirin and Plavix. He did not know how long to continue his bowel regimen. He has been seeing Dr. Tresa Moore and will be starting radiation seen.   ROS: See pertinent positives and negatives per HPI.  Past Medical History:  Diagnosis Date  . ALLERGIC RHINITIS 01/30/2007  . Arthritis    "hands" (05/29/2014)  . Coronary artery disease    LHC (05/29/14): pLAD 50-70, pRCA 99 (L>R collats), EF 55% >> PCI: 3.5 x 28 mm Xience [employer]pine DES to [employer]  . Cough secondary to angiotensin converting enzyme inhibitor (ACE-I) 05/28/2013  . ED (erectile dysfunction)   . GERD 09/26/2007  . HYPERLIPIDEMIA 09/26/2007  . Hypertension   . PLANTAR FASCIITIS, BILATERAL 07/08/2009  . Unspecified hearing loss 06/16/2009   wears aides both ears    Past Surgical History:  Procedure Laterality Date  . CARDIAC CATHETERIZATION  05/29/2014   Procedure: CORONARY STENT INTERVENTION;  Surgeon: Sinclair Grooms, MD;  Location: [facility] CATH  LAB;  Service: Cardiovascular;;  Prox RCA  . CORONARY ANGIOPLASTY WITH STENT PLACEMENT  05/29/2014   "1"  . KNEE ARTHROSCOPY Right ~ 2005  . LEFT HEART CATHETERIZATION WITH CORONARY ANGIOGRAM N/A 05/29/2014   Procedure: LEFT HEART CATHETERIZATION WITH CORONARY ANGIOGRAM;  Surgeon: Sinclair Grooms, MD;  Location: [facility] CATH LAB;  Service: Cardiovascular;  Laterality: N/A;    Family History  Problem Relation Age of Onset  . Heart attack Mother   . Heart disease Mother   . Heart attack Father   . Heart disease Father   . COPD Neg Hx        family hx  . Hyperlipidemia Neg Hx        family hx    Social History   Social History  . Marital status: Married    Spouse name: N/A  . Number of children: N/A  . Years of education: N/A   Social History Main Topics  . Smoking status: Former Smoker    Types: Cigarettes    Quit date: 05/24/1978  . Smokeless tobacco: Never Used     Comment: 05/29/2014 "smoked socially; nothing since 1980"  . Alcohol use 1.2 oz/week    2 Shots of liquor per week     Comment: beer or two a week  . Drug use: No  . Sexual activity: Not Currently   Other Topics Concern  . None   Social History Narrative   Lives with wife in a one story home.  Has 2 children.  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  Education: 3 years of college.    Does wood working.      Current Outpatient Prescriptions:  .  ezetimibe (ZETIA) 10 MG tablet, Take 1 tablet (10 mg total) by mouth daily., Disp: 90 tablet, Rfl: 3 .  fexofenadine (ALLEGRA) 180 MG tablet, Take 180 mg by mouth daily. , Disp: , Rfl:  .  loratadine (CLARITIN) 10 MG tablet, Take 10 mg by mouth daily., Disp: , Rfl:  .  metoprolol tartrate (LOPRESSOR) 25 MG tablet, take 1 tablet by mouth twice a day (Patient taking differently: Take 25mg  tablet by mouth twice a day), Disp: 180 tablet, Rfl: 5 .  Multiple Vitamin (MULTIVITAMIN WITH MINERALS) TABS tablet, Take 1 tablet by mouth daily., Disp: , Rfl:  .  NITROSTAT 0.4 MG SL tablet, PLACE 1  TABLET BY MOUTH UNDER TONGUE EVERY 5 MINUTES AS NEEDED FOR CHEST PAIN, Disp: 25 tablet, Rfl: 12 .  omeprazole (PRILOSEC) 20 MG capsule, take 1 capsule by mouth twice a day (Patient taking differently: take 40mg  by mouth once daily), Disp: 100 capsule, Rfl: 4 .  polyethylene glycol (MIRALAX / GLYCOLAX) packet, Take 17 g by mouth 3 (three) times daily., Disp: 14 each, Rfl: 0 .  simvastatin (ZOCOR) 40 MG tablet, take 1 tablet by mouth at bedtime, Disp: 90 tablet, Rfl: 3 .  tamsulosin (FLOMAX) 0.4 MG CAPS capsule, Take 0.4 mg by mouth daily. , Disp: , Rfl:   EXAM:  Vitals:   02/24/17 1321  BP: 118/80  Pulse: 73  Temp: 98.1 F (36.7 C)  SpO2: 96%    Body mass index is 33.09 kg/m.  GENERAL: vitals reviewed and listed above, alert, oriented, appears well hydrated and in no acute distress  HEENT: atraumatic, conjunttiva clear, no obvious abnormalities on inspection of external nose and ears  NECK: no obvious masses on inspection  LUNGS: clear to auscultation bilaterally, no wheezes, rales or rhonchi, good air movement  CV: HRRR, no peripheral edema  MS: moves all extremities without noticeable abnormality  PSYCH: pleasant and cooperative, no obvious depression or anxiety  ASSESSMENT AND PLAN:  Discussed the following assessment and plan:  Gastrointestinal hemorrhage, unspecified gastrointestinal hemorrhage type  Prostatic cancer ([place])  Hypertension associated with diabetes ([place])  -reviewed discharge recommendations with patient -Advised to contact his gastroenterologist for follow-up, CBC in 1 week and that he will be restarting his aspirin and Plavix after holding for 3 days, advised if they have any different recommendations that he also discussed these with his cardiologist -Return and emergency precautions given him further bleeding -We will be monitoring his blood pressure, follow-up in one month to recheck -may need to restart his hydrochlorothiazide/losartan his blood  pressure creeps up -Patient advised to return or notify a doctor immediately if symptoms worsen or persist or new concerns arise.  Patient Instructions  BEFORE YOU LEAVE: -follow up: 1 month about blood pressure  Plan per discharge documents from the hospital physicians was to restart aspirin and plavix 3 days after discharge. Please call you gastroenterologist and cardiologist if any questions.  Monitor blood pressure at home and if running high let us know so can restart the hctz-losartan blood pressure medication.  Seek care immediatly if any further bleeding.    Colin Benton R., DO

## 2017-02-25 ENCOUNTER — Encounter: Payer: Self-pay | Admitting: Medical Oncology

## 2017-02-25 ENCOUNTER — Ambulatory Visit
Admission: RE | Admit: 2017-02-25 | Discharge: 2017-02-25 | Disposition: A | Discharge status: Home / Self Care | Payer: Medicare HMO | Source: Ambulatory Visit | Attending: Radiation Oncology | Admitting: Radiation Oncology

## 2017-02-25 DIAGNOSIS — C61 Malignant neoplasm of prostate: Secondary | ICD-10-CM | POA: Diagnosis not present

## 2017-02-25 DIAGNOSIS — K219 Gastro-esophageal reflux disease without esophagitis: Secondary | ICD-10-CM | POA: Diagnosis not present

## 2017-02-25 DIAGNOSIS — Z7982 Long term (current) use of aspirin: Secondary | ICD-10-CM | POA: Diagnosis not present

## 2017-02-25 DIAGNOSIS — E785 Hyperlipidemia, unspecified: Secondary | ICD-10-CM | POA: Diagnosis not present

## 2017-02-25 DIAGNOSIS — H9190 Unspecified hearing loss, unspecified ear: Secondary | ICD-10-CM | POA: Diagnosis not present

## 2017-02-25 DIAGNOSIS — N529 Male erectile dysfunction, unspecified: Secondary | ICD-10-CM | POA: Diagnosis not present

## 2017-02-25 DIAGNOSIS — Z510 Encounter for antineoplastic radiation therapy: Secondary | ICD-10-CM | POA: Diagnosis not present

## 2017-02-25 DIAGNOSIS — Z87891 Personal history of nicotine dependence: Secondary | ICD-10-CM | POA: Diagnosis not present

## 2017-02-25 DIAGNOSIS — I2510 Atherosclerotic heart disease of native coronary artery without angina pectoris: Secondary | ICD-10-CM | POA: Diagnosis not present

## 2017-02-25 NOTE — Progress Notes (Addendum)
  Radiation Oncology         [phone] ________________________________  Name: Carl Harrell MRN: [id number]  Date: 02/25/2017  DOB: 04-19-1946  SIMULATION AND TREATMENT PLANNING NOTE    ICD-10-CM   1. Prostate cancer [facility]) C61     DIAGNOSIS:  71 y.o. gentleman with Stage T1c adenocarcinoma of the prostate with Gleason Score of 4+3, and PSA of 7.38  NARRATIVE:  The patient was brought to the [facility].  Identity was confirmed.  All relevant records and images related to the planned course of therapy were reviewed.  The patient freely provided informed written consent to proceed with treatment after reviewing the details related to the planned course of therapy. The consent form was witnessed and verified by the simulation staff.  Then, the patient was set-up in a stable reproducible supine position for radiation therapy.  A vacuum lock pillow device was custom fabricated to position his legs in a reproducible immobilized position.  Then, I performed a urethrogram under sterile conditions to identify the prostatic apex.  CT images were obtained.  Surface markings were placed.  The CT images were loaded into the planning software.  Then the prostate target and avoidance structures including the rectum, bladder, bowel and hips were contoured.  Treatment planning then occurred.  The radiation prescription was entered and confirmed.  A total of one complex treatment devices was fabricated. I have requested : Intensity Modulated Radiotherapy (IMRT) is medically necessary for this case for the following reason:  Rectal sparing.Marland Kitchen  PLAN:  The patient will receive 78 Gy in 40 fractions.  ________________________________  Sheral Apley Tammi Klippel, M.D.

## 2017-02-25 NOTE — Progress Notes (Signed)
Introduced myself to Carl Harrell as the prostate nurse navigator and my role. I was unable to meet him 9/26 when he consulted with Dr. Tammi Klippel. He is here today for CT simulation and education regarding radiation. All questions were answered and I will continue to follow. I gave him my business card and asked him to call with questions or concerns. He voiced understanding. He will begin treatments 03/08/17.

## 2017-03-02 DIAGNOSIS — Z7982 Long term (current) use of aspirin: Secondary | ICD-10-CM | POA: Diagnosis not present

## 2017-03-02 DIAGNOSIS — K219 Gastro-esophageal reflux disease without esophagitis: Secondary | ICD-10-CM | POA: Diagnosis not present

## 2017-03-02 DIAGNOSIS — C61 Malignant neoplasm of prostate: Secondary | ICD-10-CM | POA: Diagnosis not present

## 2017-03-02 DIAGNOSIS — H9190 Unspecified hearing loss, unspecified ear: Secondary | ICD-10-CM | POA: Diagnosis not present

## 2017-03-02 DIAGNOSIS — E785 Hyperlipidemia, unspecified: Secondary | ICD-10-CM | POA: Diagnosis not present

## 2017-03-02 DIAGNOSIS — I2510 Atherosclerotic heart disease of native coronary artery without angina pectoris: Secondary | ICD-10-CM | POA: Diagnosis not present

## 2017-03-02 DIAGNOSIS — Z87891 Personal history of nicotine dependence: Secondary | ICD-10-CM | POA: Diagnosis not present

## 2017-03-02 DIAGNOSIS — N529 Male erectile dysfunction, unspecified: Secondary | ICD-10-CM | POA: Diagnosis not present

## 2017-03-02 DIAGNOSIS — Z510 Encounter for antineoplastic radiation therapy: Secondary | ICD-10-CM | POA: Diagnosis not present

## 2017-03-08 ENCOUNTER — Ambulatory Visit
Admission: RE | Admit: 2017-03-08 | Discharge: 2017-03-08 | Disposition: A | Discharge status: Home / Self Care | Payer: Medicare HMO | Source: Ambulatory Visit | Attending: Radiation Oncology | Admitting: Radiation Oncology

## 2017-03-08 DIAGNOSIS — Z87891 Personal history of nicotine dependence: Secondary | ICD-10-CM | POA: Diagnosis not present

## 2017-03-08 DIAGNOSIS — E785 Hyperlipidemia, unspecified: Secondary | ICD-10-CM | POA: Diagnosis not present

## 2017-03-08 DIAGNOSIS — Z510 Encounter for antineoplastic radiation therapy: Secondary | ICD-10-CM | POA: Diagnosis not present

## 2017-03-08 DIAGNOSIS — C61 Malignant neoplasm of prostate: Secondary | ICD-10-CM | POA: Diagnosis not present

## 2017-03-08 DIAGNOSIS — N529 Male erectile dysfunction, unspecified: Secondary | ICD-10-CM | POA: Diagnosis not present

## 2017-03-08 DIAGNOSIS — Z7982 Long term (current) use of aspirin: Secondary | ICD-10-CM | POA: Diagnosis not present

## 2017-03-08 DIAGNOSIS — H9190 Unspecified hearing loss, unspecified ear: Secondary | ICD-10-CM | POA: Diagnosis not present

## 2017-03-08 DIAGNOSIS — I2510 Atherosclerotic heart disease of native coronary artery without angina pectoris: Secondary | ICD-10-CM | POA: Diagnosis not present

## 2017-03-08 DIAGNOSIS — K219 Gastro-esophageal reflux disease without esophagitis: Secondary | ICD-10-CM | POA: Diagnosis not present

## 2017-03-09 ENCOUNTER — Ambulatory Visit
Admission: RE | Admit: 2017-03-09 | Discharge: 2017-03-09 | Disposition: A | Discharge status: Home / Self Care | Payer: Medicare HMO | Source: Ambulatory Visit | Attending: Radiation Oncology | Admitting: Radiation Oncology

## 2017-03-09 DIAGNOSIS — Z510 Encounter for antineoplastic radiation therapy: Secondary | ICD-10-CM | POA: Diagnosis not present

## 2017-03-09 DIAGNOSIS — N529 Male erectile dysfunction, unspecified: Secondary | ICD-10-CM | POA: Diagnosis not present

## 2017-03-09 DIAGNOSIS — E785 Hyperlipidemia, unspecified: Secondary | ICD-10-CM | POA: Diagnosis not present

## 2017-03-09 DIAGNOSIS — C61 Malignant neoplasm of prostate: Secondary | ICD-10-CM | POA: Diagnosis not present

## 2017-03-09 DIAGNOSIS — I2510 Atherosclerotic heart disease of native coronary artery without angina pectoris: Secondary | ICD-10-CM | POA: Diagnosis not present

## 2017-03-09 DIAGNOSIS — K219 Gastro-esophageal reflux disease without esophagitis: Secondary | ICD-10-CM | POA: Diagnosis not present

## 2017-03-09 DIAGNOSIS — Z7982 Long term (current) use of aspirin: Secondary | ICD-10-CM | POA: Diagnosis not present

## 2017-03-09 DIAGNOSIS — H9190 Unspecified hearing loss, unspecified ear: Secondary | ICD-10-CM | POA: Diagnosis not present

## 2017-03-09 DIAGNOSIS — Z87891 Personal history of nicotine dependence: Secondary | ICD-10-CM | POA: Diagnosis not present

## 2017-03-10 ENCOUNTER — Ambulatory Visit
Admission: RE | Admit: 2017-03-10 | Discharge: 2017-03-10 | Disposition: A | Discharge status: Home / Self Care | Payer: Medicare HMO | Source: Ambulatory Visit | Attending: Radiation Oncology | Admitting: Radiation Oncology

## 2017-03-10 DIAGNOSIS — E785 Hyperlipidemia, unspecified: Secondary | ICD-10-CM | POA: Diagnosis not present

## 2017-03-10 DIAGNOSIS — H9190 Unspecified hearing loss, unspecified ear: Secondary | ICD-10-CM | POA: Diagnosis not present

## 2017-03-10 DIAGNOSIS — I2510 Atherosclerotic heart disease of native coronary artery without angina pectoris: Secondary | ICD-10-CM | POA: Diagnosis not present

## 2017-03-10 DIAGNOSIS — C61 Malignant neoplasm of prostate: Secondary | ICD-10-CM | POA: Diagnosis not present

## 2017-03-10 DIAGNOSIS — Z7982 Long term (current) use of aspirin: Secondary | ICD-10-CM | POA: Diagnosis not present

## 2017-03-10 DIAGNOSIS — K219 Gastro-esophageal reflux disease without esophagitis: Secondary | ICD-10-CM | POA: Diagnosis not present

## 2017-03-10 DIAGNOSIS — N529 Male erectile dysfunction, unspecified: Secondary | ICD-10-CM | POA: Diagnosis not present

## 2017-03-10 DIAGNOSIS — Z87891 Personal history of nicotine dependence: Secondary | ICD-10-CM | POA: Diagnosis not present

## 2017-03-10 DIAGNOSIS — Z510 Encounter for antineoplastic radiation therapy: Secondary | ICD-10-CM | POA: Diagnosis not present

## 2017-03-11 ENCOUNTER — Ambulatory Visit
Admission: RE | Admit: 2017-03-11 | Discharge: 2017-03-11 | Disposition: A | Discharge status: Home / Self Care | Payer: Medicare HMO | Source: Ambulatory Visit | Attending: Radiation Oncology | Admitting: Radiation Oncology

## 2017-03-11 DIAGNOSIS — E785 Hyperlipidemia, unspecified: Secondary | ICD-10-CM | POA: Diagnosis not present

## 2017-03-11 DIAGNOSIS — C61 Malignant neoplasm of prostate: Secondary | ICD-10-CM | POA: Diagnosis not present

## 2017-03-11 DIAGNOSIS — K219 Gastro-esophageal reflux disease without esophagitis: Secondary | ICD-10-CM | POA: Diagnosis not present

## 2017-03-11 DIAGNOSIS — Z87891 Personal history of nicotine dependence: Secondary | ICD-10-CM | POA: Diagnosis not present

## 2017-03-11 DIAGNOSIS — Z510 Encounter for antineoplastic radiation therapy: Secondary | ICD-10-CM | POA: Diagnosis not present

## 2017-03-11 DIAGNOSIS — H9190 Unspecified hearing loss, unspecified ear: Secondary | ICD-10-CM | POA: Diagnosis not present

## 2017-03-11 DIAGNOSIS — I2510 Atherosclerotic heart disease of native coronary artery without angina pectoris: Secondary | ICD-10-CM | POA: Diagnosis not present

## 2017-03-11 DIAGNOSIS — N529 Male erectile dysfunction, unspecified: Secondary | ICD-10-CM | POA: Diagnosis not present

## 2017-03-11 DIAGNOSIS — Z7982 Long term (current) use of aspirin: Secondary | ICD-10-CM | POA: Diagnosis not present

## 2017-03-14 ENCOUNTER — Ambulatory Visit
Admission: RE | Admit: 2017-03-14 | Discharge: 2017-03-14 | Disposition: A | Discharge status: Home / Self Care | Payer: Medicare HMO | Source: Ambulatory Visit | Attending: Radiation Oncology | Admitting: Radiation Oncology

## 2017-03-14 DIAGNOSIS — Z87891 Personal history of nicotine dependence: Secondary | ICD-10-CM | POA: Diagnosis not present

## 2017-03-14 DIAGNOSIS — I2510 Atherosclerotic heart disease of native coronary artery without angina pectoris: Secondary | ICD-10-CM | POA: Diagnosis not present

## 2017-03-14 DIAGNOSIS — Z510 Encounter for antineoplastic radiation therapy: Secondary | ICD-10-CM | POA: Diagnosis not present

## 2017-03-14 DIAGNOSIS — Z7982 Long term (current) use of aspirin: Secondary | ICD-10-CM | POA: Diagnosis not present

## 2017-03-14 DIAGNOSIS — C61 Malignant neoplasm of prostate: Secondary | ICD-10-CM | POA: Diagnosis not present

## 2017-03-14 DIAGNOSIS — H9190 Unspecified hearing loss, unspecified ear: Secondary | ICD-10-CM | POA: Diagnosis not present

## 2017-03-14 DIAGNOSIS — K219 Gastro-esophageal reflux disease without esophagitis: Secondary | ICD-10-CM | POA: Diagnosis not present

## 2017-03-14 DIAGNOSIS — E785 Hyperlipidemia, unspecified: Secondary | ICD-10-CM | POA: Diagnosis not present

## 2017-03-14 DIAGNOSIS — N529 Male erectile dysfunction, unspecified: Secondary | ICD-10-CM | POA: Diagnosis not present

## 2017-03-15 ENCOUNTER — Ambulatory Visit
Admission: RE | Admit: 2017-03-15 | Discharge: 2017-03-15 | Disposition: A | Discharge status: Home / Self Care | Payer: Medicare HMO | Source: Ambulatory Visit | Attending: Radiation Oncology | Admitting: Radiation Oncology

## 2017-03-15 DIAGNOSIS — Z510 Encounter for antineoplastic radiation therapy: Secondary | ICD-10-CM | POA: Diagnosis not present

## 2017-03-15 DIAGNOSIS — H9190 Unspecified hearing loss, unspecified ear: Secondary | ICD-10-CM | POA: Diagnosis not present

## 2017-03-15 DIAGNOSIS — Z7982 Long term (current) use of aspirin: Secondary | ICD-10-CM | POA: Diagnosis not present

## 2017-03-15 DIAGNOSIS — E785 Hyperlipidemia, unspecified: Secondary | ICD-10-CM | POA: Diagnosis not present

## 2017-03-15 DIAGNOSIS — C61 Malignant neoplasm of prostate: Secondary | ICD-10-CM | POA: Diagnosis not present

## 2017-03-15 DIAGNOSIS — Z87891 Personal history of nicotine dependence: Secondary | ICD-10-CM | POA: Diagnosis not present

## 2017-03-15 DIAGNOSIS — K219 Gastro-esophageal reflux disease without esophagitis: Secondary | ICD-10-CM | POA: Diagnosis not present

## 2017-03-15 DIAGNOSIS — N529 Male erectile dysfunction, unspecified: Secondary | ICD-10-CM | POA: Diagnosis not present

## 2017-03-15 DIAGNOSIS — I2510 Atherosclerotic heart disease of native coronary artery without angina pectoris: Secondary | ICD-10-CM | POA: Diagnosis not present

## 2017-03-16 ENCOUNTER — Ambulatory Visit
Admission: RE | Admit: 2017-03-16 | Discharge: 2017-03-16 | Disposition: A | Discharge status: Home / Self Care | Payer: Medicare HMO | Source: Ambulatory Visit | Attending: Radiation Oncology | Admitting: Radiation Oncology

## 2017-03-16 DIAGNOSIS — Z7982 Long term (current) use of aspirin: Secondary | ICD-10-CM | POA: Diagnosis not present

## 2017-03-16 DIAGNOSIS — Z87891 Personal history of nicotine dependence: Secondary | ICD-10-CM | POA: Diagnosis not present

## 2017-03-16 DIAGNOSIS — H9190 Unspecified hearing loss, unspecified ear: Secondary | ICD-10-CM | POA: Diagnosis not present

## 2017-03-16 DIAGNOSIS — E785 Hyperlipidemia, unspecified: Secondary | ICD-10-CM | POA: Diagnosis not present

## 2017-03-16 DIAGNOSIS — Z510 Encounter for antineoplastic radiation therapy: Secondary | ICD-10-CM | POA: Diagnosis not present

## 2017-03-16 DIAGNOSIS — N529 Male erectile dysfunction, unspecified: Secondary | ICD-10-CM | POA: Diagnosis not present

## 2017-03-16 DIAGNOSIS — C61 Malignant neoplasm of prostate: Secondary | ICD-10-CM | POA: Diagnosis not present

## 2017-03-16 DIAGNOSIS — K219 Gastro-esophageal reflux disease without esophagitis: Secondary | ICD-10-CM | POA: Diagnosis not present

## 2017-03-16 DIAGNOSIS — I2510 Atherosclerotic heart disease of native coronary artery without angina pectoris: Secondary | ICD-10-CM | POA: Diagnosis not present

## 2017-03-17 ENCOUNTER — Ambulatory Visit
Admission: RE | Admit: 2017-03-17 | Discharge: 2017-03-17 | Disposition: A | Discharge status: Home / Self Care | Payer: Medicare HMO | Source: Ambulatory Visit | Attending: Radiation Oncology | Admitting: Radiation Oncology

## 2017-03-17 DIAGNOSIS — Z7982 Long term (current) use of aspirin: Secondary | ICD-10-CM | POA: Diagnosis not present

## 2017-03-17 DIAGNOSIS — I2510 Atherosclerotic heart disease of native coronary artery without angina pectoris: Secondary | ICD-10-CM | POA: Diagnosis not present

## 2017-03-17 DIAGNOSIS — C61 Malignant neoplasm of prostate: Secondary | ICD-10-CM | POA: Diagnosis not present

## 2017-03-17 DIAGNOSIS — N529 Male erectile dysfunction, unspecified: Secondary | ICD-10-CM | POA: Diagnosis not present

## 2017-03-17 DIAGNOSIS — Z87891 Personal history of nicotine dependence: Secondary | ICD-10-CM | POA: Diagnosis not present

## 2017-03-17 DIAGNOSIS — Z510 Encounter for antineoplastic radiation therapy: Secondary | ICD-10-CM | POA: Diagnosis not present

## 2017-03-17 DIAGNOSIS — H9190 Unspecified hearing loss, unspecified ear: Secondary | ICD-10-CM | POA: Diagnosis not present

## 2017-03-17 DIAGNOSIS — E785 Hyperlipidemia, unspecified: Secondary | ICD-10-CM | POA: Diagnosis not present

## 2017-03-17 DIAGNOSIS — K219 Gastro-esophageal reflux disease without esophagitis: Secondary | ICD-10-CM | POA: Diagnosis not present

## 2017-03-18 ENCOUNTER — Ambulatory Visit
Admission: RE | Admit: 2017-03-18 | Discharge: 2017-03-18 | Disposition: A | Discharge status: Home / Self Care | Payer: Medicare HMO | Source: Ambulatory Visit | Attending: Radiation Oncology | Admitting: Radiation Oncology

## 2017-03-18 DIAGNOSIS — H9190 Unspecified hearing loss, unspecified ear: Secondary | ICD-10-CM | POA: Diagnosis not present

## 2017-03-18 DIAGNOSIS — Z87891 Personal history of nicotine dependence: Secondary | ICD-10-CM | POA: Diagnosis not present

## 2017-03-18 DIAGNOSIS — Z7982 Long term (current) use of aspirin: Secondary | ICD-10-CM | POA: Diagnosis not present

## 2017-03-18 DIAGNOSIS — N529 Male erectile dysfunction, unspecified: Secondary | ICD-10-CM | POA: Diagnosis not present

## 2017-03-18 DIAGNOSIS — C61 Malignant neoplasm of prostate: Secondary | ICD-10-CM | POA: Diagnosis not present

## 2017-03-18 DIAGNOSIS — K219 Gastro-esophageal reflux disease without esophagitis: Secondary | ICD-10-CM | POA: Diagnosis not present

## 2017-03-18 DIAGNOSIS — Z510 Encounter for antineoplastic radiation therapy: Secondary | ICD-10-CM | POA: Diagnosis not present

## 2017-03-18 DIAGNOSIS — I2510 Atherosclerotic heart disease of native coronary artery without angina pectoris: Secondary | ICD-10-CM | POA: Diagnosis not present

## 2017-03-18 DIAGNOSIS — E785 Hyperlipidemia, unspecified: Secondary | ICD-10-CM | POA: Diagnosis not present

## 2017-03-21 ENCOUNTER — Ambulatory Visit
Admission: RE | Admit: 2017-03-21 | Discharge: 2017-03-21 | Disposition: A | Discharge status: Home / Self Care | Payer: Medicare HMO | Source: Ambulatory Visit | Attending: Radiation Oncology | Admitting: Radiation Oncology

## 2017-03-21 DIAGNOSIS — I2510 Atherosclerotic heart disease of native coronary artery without angina pectoris: Secondary | ICD-10-CM | POA: Diagnosis not present

## 2017-03-21 DIAGNOSIS — Z87891 Personal history of nicotine dependence: Secondary | ICD-10-CM | POA: Diagnosis not present

## 2017-03-21 DIAGNOSIS — Z510 Encounter for antineoplastic radiation therapy: Secondary | ICD-10-CM | POA: Diagnosis not present

## 2017-03-21 DIAGNOSIS — K219 Gastro-esophageal reflux disease without esophagitis: Secondary | ICD-10-CM | POA: Diagnosis not present

## 2017-03-21 DIAGNOSIS — C61 Malignant neoplasm of prostate: Secondary | ICD-10-CM | POA: Diagnosis not present

## 2017-03-21 DIAGNOSIS — E785 Hyperlipidemia, unspecified: Secondary | ICD-10-CM | POA: Diagnosis not present

## 2017-03-21 DIAGNOSIS — H9190 Unspecified hearing loss, unspecified ear: Secondary | ICD-10-CM | POA: Diagnosis not present

## 2017-03-21 DIAGNOSIS — Z7982 Long term (current) use of aspirin: Secondary | ICD-10-CM | POA: Diagnosis not present

## 2017-03-21 DIAGNOSIS — N529 Male erectile dysfunction, unspecified: Secondary | ICD-10-CM | POA: Diagnosis not present

## 2017-03-22 ENCOUNTER — Ambulatory Visit
Admission: RE | Admit: 2017-03-22 | Discharge: 2017-03-22 | Disposition: A | Discharge status: Home / Self Care | Payer: Medicare HMO | Source: Ambulatory Visit | Attending: Radiation Oncology | Admitting: Radiation Oncology

## 2017-03-22 DIAGNOSIS — I2510 Atherosclerotic heart disease of native coronary artery without angina pectoris: Secondary | ICD-10-CM | POA: Diagnosis not present

## 2017-03-22 DIAGNOSIS — Z510 Encounter for antineoplastic radiation therapy: Secondary | ICD-10-CM | POA: Diagnosis not present

## 2017-03-22 DIAGNOSIS — H9190 Unspecified hearing loss, unspecified ear: Secondary | ICD-10-CM | POA: Diagnosis not present

## 2017-03-22 DIAGNOSIS — K219 Gastro-esophageal reflux disease without esophagitis: Secondary | ICD-10-CM | POA: Diagnosis not present

## 2017-03-22 DIAGNOSIS — C61 Malignant neoplasm of prostate: Secondary | ICD-10-CM | POA: Diagnosis not present

## 2017-03-22 DIAGNOSIS — Z7982 Long term (current) use of aspirin: Secondary | ICD-10-CM | POA: Diagnosis not present

## 2017-03-22 DIAGNOSIS — Z87891 Personal history of nicotine dependence: Secondary | ICD-10-CM | POA: Diagnosis not present

## 2017-03-22 DIAGNOSIS — E785 Hyperlipidemia, unspecified: Secondary | ICD-10-CM | POA: Diagnosis not present

## 2017-03-22 DIAGNOSIS — N529 Male erectile dysfunction, unspecified: Secondary | ICD-10-CM | POA: Diagnosis not present

## 2017-03-23 ENCOUNTER — Telehealth: Payer: Self-pay | Admitting: Urology

## 2017-03-23 ENCOUNTER — Ambulatory Visit
Admission: RE | Admit: 2017-03-23 | Discharge: 2017-03-23 | Disposition: A | Discharge status: Home / Self Care | Payer: Medicare HMO | Source: Ambulatory Visit | Attending: Radiation Oncology | Admitting: Radiation Oncology

## 2017-03-23 ENCOUNTER — Ambulatory Visit
Admission: RE | Admit: 2017-03-23 | Discharge: 2017-03-23 | Disposition: A | Discharge status: Home / Self Care | Payer: Medicare HMO | Source: Ambulatory Visit | Attending: Urology | Admitting: Urology

## 2017-03-23 VITALS — BP 111/71 | HR 72 | Temp 98.3°F | Resp 16 | Wt 210.2 lb

## 2017-03-23 DIAGNOSIS — C61 Malignant neoplasm of prostate: Secondary | ICD-10-CM

## 2017-03-23 DIAGNOSIS — E785 Hyperlipidemia, unspecified: Secondary | ICD-10-CM | POA: Diagnosis not present

## 2017-03-23 DIAGNOSIS — I2510 Atherosclerotic heart disease of native coronary artery without angina pectoris: Secondary | ICD-10-CM | POA: Diagnosis not present

## 2017-03-23 DIAGNOSIS — H9190 Unspecified hearing loss, unspecified ear: Secondary | ICD-10-CM | POA: Diagnosis not present

## 2017-03-23 DIAGNOSIS — Z87891 Personal history of nicotine dependence: Secondary | ICD-10-CM | POA: Diagnosis not present

## 2017-03-23 DIAGNOSIS — Z7982 Long term (current) use of aspirin: Secondary | ICD-10-CM | POA: Diagnosis not present

## 2017-03-23 DIAGNOSIS — R300 Dysuria: Secondary | ICD-10-CM

## 2017-03-23 DIAGNOSIS — K219 Gastro-esophageal reflux disease without esophagitis: Secondary | ICD-10-CM | POA: Diagnosis not present

## 2017-03-23 DIAGNOSIS — N529 Male erectile dysfunction, unspecified: Secondary | ICD-10-CM | POA: Diagnosis not present

## 2017-03-23 DIAGNOSIS — Z510 Encounter for antineoplastic radiation therapy: Secondary | ICD-10-CM | POA: Diagnosis not present

## 2017-03-23 LAB — URINALYSIS, MICROSCOPIC - CHCC
Bilirubin (Urine): NEGATIVE
Blood: NEGATIVE
GLUCOSE UR CHCC: NEGATIVE mg/dL
Ketones: NEGATIVE mg/dL
NITRITE: NEGATIVE
PROTEIN: NEGATIVE mg/dL
RBC / HPF: NEGATIVE (ref 0–2)
Specific Gravity, Urine: 1.005 (ref 1.003–1.035)
UROBILINOGEN UR: 0.2 mg/dL (ref 0.2–1)
pH: 6.5 (ref 4.6–8.0)

## 2017-03-23 NOTE — Progress Notes (Addendum)
Received patient in the clinic following radiation treatment. Patient requesting to be evaluated for dysuria and increased nocturia. Patient reports increased urinary frequency during the day time hours. Reports nocturia up to every 45 minutes from every four hours. Reports new onset dysuria. Reports a weak intermittent urine stream. Reports he feels like he has a sunburn on the inside of the right side of his pelvis that radiates down his right leg. States, "I feel like I have to pee all the time." Denies urinary leakage or incontinence. Reports a few episodes of diarrhea this week. Reports fatigue associated with nocturia. Patient reports taking one Flomax tablet just before bed. Encouraged patient to take Flomax with dinner to allow time before bed to empty his bladder. Patient verbalized understanding. Provided patient with LOW RESIDUE DIET worksheet and reviewed pertinent material. Patient verbalized understanding.   BP 111/71 (BP Location: Right Arm, Patient Position: Sitting, Cuff Size: Large)   Pulse 72   Temp 98.3 F (36.8 C) (Oral)   Resp 16   Wt 210 lb 3.2 oz (95.3 kg)   SpO2 100%   BMI 32.92 kg/m  Wt Readings from Last 3 Encounters:  03/23/17 210 lb 3.2 oz (95.3 kg)  02/24/17 211 lb 4.8 oz (95.8 kg)  02/21/17 204 lb (92.5 kg)

## 2017-03-23 NOTE — Telephone Encounter (Signed)
Patient informed of preliminary urinalysis results which show only a trace of bacteria. This is not convincing for infection, therefore I will hold off on prescribing antibiotics until we have final urine culture results. In the meantime, we discussed that he can increase his Flomax to 2 capsules at dinnertime or 1 in the morning and 1 in the evening to see if this will help improve his dysuria and significant/bothersome nocturia.  He is comfortable with this plan.   Nicholos Johns, PA-C

## 2017-03-24 ENCOUNTER — Ambulatory Visit
Admission: RE | Admit: 2017-03-24 | Discharge: 2017-03-24 | Disposition: A | Discharge status: Home / Self Care | Payer: Medicare HMO | Source: Ambulatory Visit | Attending: Radiation Oncology | Admitting: Radiation Oncology

## 2017-03-24 DIAGNOSIS — I2510 Atherosclerotic heart disease of native coronary artery without angina pectoris: Secondary | ICD-10-CM | POA: Diagnosis not present

## 2017-03-24 DIAGNOSIS — N529 Male erectile dysfunction, unspecified: Secondary | ICD-10-CM | POA: Diagnosis not present

## 2017-03-24 DIAGNOSIS — K219 Gastro-esophageal reflux disease without esophagitis: Secondary | ICD-10-CM | POA: Diagnosis not present

## 2017-03-24 DIAGNOSIS — Z7982 Long term (current) use of aspirin: Secondary | ICD-10-CM | POA: Diagnosis not present

## 2017-03-24 DIAGNOSIS — E785 Hyperlipidemia, unspecified: Secondary | ICD-10-CM | POA: Diagnosis not present

## 2017-03-24 DIAGNOSIS — Z510 Encounter for antineoplastic radiation therapy: Secondary | ICD-10-CM | POA: Diagnosis not present

## 2017-03-24 DIAGNOSIS — Z87891 Personal history of nicotine dependence: Secondary | ICD-10-CM | POA: Diagnosis not present

## 2017-03-24 DIAGNOSIS — H9190 Unspecified hearing loss, unspecified ear: Secondary | ICD-10-CM | POA: Diagnosis not present

## 2017-03-24 DIAGNOSIS — C61 Malignant neoplasm of prostate: Secondary | ICD-10-CM | POA: Diagnosis not present

## 2017-03-24 LAB — URINE CULTURE: ORGANISM ID, BACTERIA: NO GROWTH

## 2017-03-25 ENCOUNTER — Telehealth: Payer: Self-pay | Admitting: Radiation Oncology

## 2017-03-25 ENCOUNTER — Ambulatory Visit
Admission: RE | Admit: 2017-03-25 | Discharge: 2017-03-25 | Disposition: A | Discharge status: Home / Self Care | Payer: Medicare HMO | Source: Ambulatory Visit | Attending: Radiation Oncology | Admitting: Radiation Oncology

## 2017-03-25 DIAGNOSIS — E785 Hyperlipidemia, unspecified: Secondary | ICD-10-CM | POA: Diagnosis not present

## 2017-03-25 DIAGNOSIS — K219 Gastro-esophageal reflux disease without esophagitis: Secondary | ICD-10-CM | POA: Diagnosis not present

## 2017-03-25 DIAGNOSIS — H9190 Unspecified hearing loss, unspecified ear: Secondary | ICD-10-CM | POA: Diagnosis not present

## 2017-03-25 DIAGNOSIS — Z510 Encounter for antineoplastic radiation therapy: Secondary | ICD-10-CM | POA: Diagnosis not present

## 2017-03-25 DIAGNOSIS — Z7982 Long term (current) use of aspirin: Secondary | ICD-10-CM | POA: Diagnosis not present

## 2017-03-25 DIAGNOSIS — C61 Malignant neoplasm of prostate: Secondary | ICD-10-CM | POA: Diagnosis not present

## 2017-03-25 DIAGNOSIS — N529 Male erectile dysfunction, unspecified: Secondary | ICD-10-CM | POA: Diagnosis not present

## 2017-03-25 DIAGNOSIS — Z87891 Personal history of nicotine dependence: Secondary | ICD-10-CM | POA: Diagnosis not present

## 2017-03-25 DIAGNOSIS — I2510 Atherosclerotic heart disease of native coronary artery without angina pectoris: Secondary | ICD-10-CM | POA: Diagnosis not present

## 2017-03-25 NOTE — Telephone Encounter (Signed)
Phoned patient to inform him that he does not have a UTI and his culture did not show growth per Ashlyn Bruning, PA-C. No answer. Left detailed message with my contact information for future questions.

## 2017-03-28 ENCOUNTER — Ambulatory Visit
Admission: RE | Admit: 2017-03-28 | Discharge: 2017-03-28 | Disposition: A | Discharge status: Home / Self Care | Payer: Medicare HMO | Source: Ambulatory Visit | Attending: Radiation Oncology | Admitting: Radiation Oncology

## 2017-03-28 DIAGNOSIS — H9190 Unspecified hearing loss, unspecified ear: Secondary | ICD-10-CM | POA: Diagnosis not present

## 2017-03-28 DIAGNOSIS — Z510 Encounter for antineoplastic radiation therapy: Secondary | ICD-10-CM | POA: Diagnosis not present

## 2017-03-28 DIAGNOSIS — C61 Malignant neoplasm of prostate: Secondary | ICD-10-CM | POA: Diagnosis not present

## 2017-03-28 DIAGNOSIS — N529 Male erectile dysfunction, unspecified: Secondary | ICD-10-CM | POA: Diagnosis not present

## 2017-03-28 DIAGNOSIS — K219 Gastro-esophageal reflux disease without esophagitis: Secondary | ICD-10-CM | POA: Diagnosis not present

## 2017-03-28 DIAGNOSIS — Z7982 Long term (current) use of aspirin: Secondary | ICD-10-CM | POA: Diagnosis not present

## 2017-03-28 DIAGNOSIS — I2510 Atherosclerotic heart disease of native coronary artery without angina pectoris: Secondary | ICD-10-CM | POA: Diagnosis not present

## 2017-03-28 DIAGNOSIS — E785 Hyperlipidemia, unspecified: Secondary | ICD-10-CM | POA: Diagnosis not present

## 2017-03-28 DIAGNOSIS — Z87891 Personal history of nicotine dependence: Secondary | ICD-10-CM | POA: Diagnosis not present

## 2017-03-29 ENCOUNTER — Ambulatory Visit
Admission: RE | Admit: 2017-03-29 | Discharge: 2017-03-29 | Disposition: A | Discharge status: Home / Self Care | Payer: Medicare HMO | Source: Ambulatory Visit | Attending: Radiation Oncology | Admitting: Radiation Oncology

## 2017-03-29 DIAGNOSIS — C61 Malignant neoplasm of prostate: Secondary | ICD-10-CM | POA: Diagnosis not present

## 2017-03-29 DIAGNOSIS — N529 Male erectile dysfunction, unspecified: Secondary | ICD-10-CM | POA: Diagnosis not present

## 2017-03-29 DIAGNOSIS — K219 Gastro-esophageal reflux disease without esophagitis: Secondary | ICD-10-CM | POA: Diagnosis not present

## 2017-03-29 DIAGNOSIS — Z510 Encounter for antineoplastic radiation therapy: Secondary | ICD-10-CM | POA: Diagnosis not present

## 2017-03-29 DIAGNOSIS — H9190 Unspecified hearing loss, unspecified ear: Secondary | ICD-10-CM | POA: Diagnosis not present

## 2017-03-29 DIAGNOSIS — Z7982 Long term (current) use of aspirin: Secondary | ICD-10-CM | POA: Diagnosis not present

## 2017-03-29 DIAGNOSIS — E785 Hyperlipidemia, unspecified: Secondary | ICD-10-CM | POA: Diagnosis not present

## 2017-03-29 DIAGNOSIS — Z87891 Personal history of nicotine dependence: Secondary | ICD-10-CM | POA: Diagnosis not present

## 2017-03-29 DIAGNOSIS — I2510 Atherosclerotic heart disease of native coronary artery without angina pectoris: Secondary | ICD-10-CM | POA: Diagnosis not present

## 2017-03-30 ENCOUNTER — Ambulatory Visit
Admission: RE | Admit: 2017-03-30 | Discharge: 2017-03-30 | Disposition: A | Discharge status: Home / Self Care | Payer: Medicare HMO | Source: Ambulatory Visit | Attending: Radiation Oncology | Admitting: Radiation Oncology

## 2017-03-30 DIAGNOSIS — N529 Male erectile dysfunction, unspecified: Secondary | ICD-10-CM | POA: Diagnosis not present

## 2017-03-30 DIAGNOSIS — E785 Hyperlipidemia, unspecified: Secondary | ICD-10-CM | POA: Diagnosis not present

## 2017-03-30 DIAGNOSIS — K219 Gastro-esophageal reflux disease without esophagitis: Secondary | ICD-10-CM | POA: Diagnosis not present

## 2017-03-30 DIAGNOSIS — Z7982 Long term (current) use of aspirin: Secondary | ICD-10-CM | POA: Diagnosis not present

## 2017-03-30 DIAGNOSIS — Z87891 Personal history of nicotine dependence: Secondary | ICD-10-CM | POA: Diagnosis not present

## 2017-03-30 DIAGNOSIS — C61 Malignant neoplasm of prostate: Secondary | ICD-10-CM | POA: Diagnosis not present

## 2017-03-30 DIAGNOSIS — H9190 Unspecified hearing loss, unspecified ear: Secondary | ICD-10-CM | POA: Diagnosis not present

## 2017-03-30 DIAGNOSIS — I2510 Atherosclerotic heart disease of native coronary artery without angina pectoris: Secondary | ICD-10-CM | POA: Diagnosis not present

## 2017-03-30 DIAGNOSIS — Z510 Encounter for antineoplastic radiation therapy: Secondary | ICD-10-CM | POA: Diagnosis not present

## 2017-03-31 ENCOUNTER — Ambulatory Visit
Admission: RE | Admit: 2017-03-31 | Discharge: 2017-03-31 | Disposition: A | Discharge status: Home / Self Care | Payer: Medicare HMO | Source: Ambulatory Visit | Attending: Radiation Oncology | Admitting: Radiation Oncology

## 2017-03-31 DIAGNOSIS — H9190 Unspecified hearing loss, unspecified ear: Secondary | ICD-10-CM | POA: Diagnosis not present

## 2017-03-31 DIAGNOSIS — C61 Malignant neoplasm of prostate: Secondary | ICD-10-CM | POA: Diagnosis not present

## 2017-03-31 DIAGNOSIS — Z7982 Long term (current) use of aspirin: Secondary | ICD-10-CM | POA: Diagnosis not present

## 2017-03-31 DIAGNOSIS — Z510 Encounter for antineoplastic radiation therapy: Secondary | ICD-10-CM | POA: Diagnosis not present

## 2017-03-31 DIAGNOSIS — E785 Hyperlipidemia, unspecified: Secondary | ICD-10-CM | POA: Diagnosis not present

## 2017-03-31 DIAGNOSIS — K219 Gastro-esophageal reflux disease without esophagitis: Secondary | ICD-10-CM | POA: Diagnosis not present

## 2017-03-31 DIAGNOSIS — I2510 Atherosclerotic heart disease of native coronary artery without angina pectoris: Secondary | ICD-10-CM | POA: Diagnosis not present

## 2017-03-31 DIAGNOSIS — Z87891 Personal history of nicotine dependence: Secondary | ICD-10-CM | POA: Diagnosis not present

## 2017-03-31 DIAGNOSIS — N529 Male erectile dysfunction, unspecified: Secondary | ICD-10-CM | POA: Diagnosis not present

## 2017-04-01 ENCOUNTER — Ambulatory Visit
Admission: RE | Admit: 2017-04-01 | Discharge: 2017-04-01 | Disposition: A | Discharge status: Home / Self Care | Payer: Medicare HMO | Source: Ambulatory Visit | Attending: Radiation Oncology | Admitting: Radiation Oncology

## 2017-04-01 DIAGNOSIS — K219 Gastro-esophageal reflux disease without esophagitis: Secondary | ICD-10-CM | POA: Diagnosis not present

## 2017-04-01 DIAGNOSIS — Z7982 Long term (current) use of aspirin: Secondary | ICD-10-CM | POA: Diagnosis not present

## 2017-04-01 DIAGNOSIS — N529 Male erectile dysfunction, unspecified: Secondary | ICD-10-CM | POA: Diagnosis not present

## 2017-04-01 DIAGNOSIS — I2510 Atherosclerotic heart disease of native coronary artery without angina pectoris: Secondary | ICD-10-CM | POA: Diagnosis not present

## 2017-04-01 DIAGNOSIS — E785 Hyperlipidemia, unspecified: Secondary | ICD-10-CM | POA: Diagnosis not present

## 2017-04-01 DIAGNOSIS — Z510 Encounter for antineoplastic radiation therapy: Secondary | ICD-10-CM | POA: Diagnosis not present

## 2017-04-01 DIAGNOSIS — C61 Malignant neoplasm of prostate: Secondary | ICD-10-CM | POA: Diagnosis not present

## 2017-04-01 DIAGNOSIS — Z87891 Personal history of nicotine dependence: Secondary | ICD-10-CM | POA: Diagnosis not present

## 2017-04-01 DIAGNOSIS — H9190 Unspecified hearing loss, unspecified ear: Secondary | ICD-10-CM | POA: Diagnosis not present

## 2017-04-04 ENCOUNTER — Ambulatory Visit
Admission: RE | Admit: 2017-04-04 | Discharge: 2017-04-04 | Disposition: A | Discharge status: Home / Self Care | Payer: Medicare HMO | Source: Ambulatory Visit | Attending: Radiation Oncology | Admitting: Radiation Oncology

## 2017-04-04 DIAGNOSIS — E785 Hyperlipidemia, unspecified: Secondary | ICD-10-CM | POA: Diagnosis not present

## 2017-04-04 DIAGNOSIS — I2510 Atherosclerotic heart disease of native coronary artery without angina pectoris: Secondary | ICD-10-CM | POA: Diagnosis not present

## 2017-04-04 DIAGNOSIS — C61 Malignant neoplasm of prostate: Secondary | ICD-10-CM | POA: Diagnosis not present

## 2017-04-04 DIAGNOSIS — Z87891 Personal history of nicotine dependence: Secondary | ICD-10-CM | POA: Diagnosis not present

## 2017-04-04 DIAGNOSIS — K219 Gastro-esophageal reflux disease without esophagitis: Secondary | ICD-10-CM | POA: Diagnosis not present

## 2017-04-04 DIAGNOSIS — H9190 Unspecified hearing loss, unspecified ear: Secondary | ICD-10-CM | POA: Diagnosis not present

## 2017-04-04 DIAGNOSIS — Z510 Encounter for antineoplastic radiation therapy: Secondary | ICD-10-CM | POA: Diagnosis not present

## 2017-04-04 DIAGNOSIS — Z7982 Long term (current) use of aspirin: Secondary | ICD-10-CM | POA: Diagnosis not present

## 2017-04-04 DIAGNOSIS — N529 Male erectile dysfunction, unspecified: Secondary | ICD-10-CM | POA: Diagnosis not present

## 2017-04-05 ENCOUNTER — Ambulatory Visit
Admission: RE | Admit: 2017-04-05 | Discharge: 2017-04-05 | Disposition: A | Discharge status: Home / Self Care | Payer: Medicare HMO | Source: Ambulatory Visit | Attending: Radiation Oncology | Admitting: Radiation Oncology

## 2017-04-05 DIAGNOSIS — E785 Hyperlipidemia, unspecified: Secondary | ICD-10-CM | POA: Diagnosis not present

## 2017-04-05 DIAGNOSIS — Z510 Encounter for antineoplastic radiation therapy: Secondary | ICD-10-CM | POA: Diagnosis not present

## 2017-04-05 DIAGNOSIS — N529 Male erectile dysfunction, unspecified: Secondary | ICD-10-CM | POA: Diagnosis not present

## 2017-04-05 DIAGNOSIS — I2510 Atherosclerotic heart disease of native coronary artery without angina pectoris: Secondary | ICD-10-CM | POA: Diagnosis not present

## 2017-04-05 DIAGNOSIS — Z7982 Long term (current) use of aspirin: Secondary | ICD-10-CM | POA: Diagnosis not present

## 2017-04-05 DIAGNOSIS — C61 Malignant neoplasm of prostate: Secondary | ICD-10-CM | POA: Diagnosis not present

## 2017-04-05 DIAGNOSIS — Z87891 Personal history of nicotine dependence: Secondary | ICD-10-CM | POA: Diagnosis not present

## 2017-04-05 DIAGNOSIS — K219 Gastro-esophageal reflux disease without esophagitis: Secondary | ICD-10-CM | POA: Diagnosis not present

## 2017-04-05 DIAGNOSIS — H9190 Unspecified hearing loss, unspecified ear: Secondary | ICD-10-CM | POA: Diagnosis not present

## 2017-04-06 ENCOUNTER — Ambulatory Visit
Admission: RE | Admit: 2017-04-06 | Discharge: 2017-04-06 | Disposition: A | Discharge status: Home / Self Care | Payer: Medicare HMO | Source: Ambulatory Visit | Attending: Radiation Oncology | Admitting: Radiation Oncology

## 2017-04-06 DIAGNOSIS — K219 Gastro-esophageal reflux disease without esophagitis: Secondary | ICD-10-CM | POA: Diagnosis not present

## 2017-04-06 DIAGNOSIS — H9190 Unspecified hearing loss, unspecified ear: Secondary | ICD-10-CM | POA: Diagnosis not present

## 2017-04-06 DIAGNOSIS — C61 Malignant neoplasm of prostate: Secondary | ICD-10-CM | POA: Diagnosis not present

## 2017-04-06 DIAGNOSIS — Z7982 Long term (current) use of aspirin: Secondary | ICD-10-CM | POA: Diagnosis not present

## 2017-04-06 DIAGNOSIS — N529 Male erectile dysfunction, unspecified: Secondary | ICD-10-CM | POA: Diagnosis not present

## 2017-04-06 DIAGNOSIS — Z510 Encounter for antineoplastic radiation therapy: Secondary | ICD-10-CM | POA: Diagnosis not present

## 2017-04-06 DIAGNOSIS — I2510 Atherosclerotic heart disease of native coronary artery without angina pectoris: Secondary | ICD-10-CM | POA: Diagnosis not present

## 2017-04-06 DIAGNOSIS — Z87891 Personal history of nicotine dependence: Secondary | ICD-10-CM | POA: Diagnosis not present

## 2017-04-06 DIAGNOSIS — E785 Hyperlipidemia, unspecified: Secondary | ICD-10-CM | POA: Diagnosis not present

## 2017-04-07 ENCOUNTER — Other Ambulatory Visit: Payer: Self-pay | Admitting: Radiation Oncology

## 2017-04-07 ENCOUNTER — Ambulatory Visit
Admission: RE | Admit: 2017-04-07 | Discharge: 2017-04-07 | Disposition: A | Discharge status: Home / Self Care | Payer: Medicare HMO | Source: Ambulatory Visit | Attending: Radiation Oncology | Admitting: Radiation Oncology

## 2017-04-07 DIAGNOSIS — N529 Male erectile dysfunction, unspecified: Secondary | ICD-10-CM | POA: Diagnosis not present

## 2017-04-07 DIAGNOSIS — Z510 Encounter for antineoplastic radiation therapy: Secondary | ICD-10-CM | POA: Diagnosis not present

## 2017-04-07 DIAGNOSIS — K219 Gastro-esophageal reflux disease without esophagitis: Secondary | ICD-10-CM | POA: Diagnosis not present

## 2017-04-07 DIAGNOSIS — H9190 Unspecified hearing loss, unspecified ear: Secondary | ICD-10-CM | POA: Diagnosis not present

## 2017-04-07 DIAGNOSIS — I2510 Atherosclerotic heart disease of native coronary artery without angina pectoris: Secondary | ICD-10-CM | POA: Diagnosis not present

## 2017-04-07 DIAGNOSIS — E785 Hyperlipidemia, unspecified: Secondary | ICD-10-CM | POA: Diagnosis not present

## 2017-04-07 DIAGNOSIS — Z87891 Personal history of nicotine dependence: Secondary | ICD-10-CM | POA: Diagnosis not present

## 2017-04-07 DIAGNOSIS — Z7982 Long term (current) use of aspirin: Secondary | ICD-10-CM | POA: Diagnosis not present

## 2017-04-07 DIAGNOSIS — C61 Malignant neoplasm of prostate: Secondary | ICD-10-CM | POA: Diagnosis not present

## 2017-04-07 MED ORDER — TAMSULOSIN HCL 0.4 MG PO CAPS: 0.4000 mg | ORAL_CAPSULE | Freq: Two times a day (BID) | ORAL | 5 refills | Status: DC

## 2017-04-08 ENCOUNTER — Ambulatory Visit
Admission: RE | Admit: 2017-04-08 | Discharge: 2017-04-08 | Disposition: A | Discharge status: Home / Self Care | Payer: Medicare HMO | Source: Ambulatory Visit | Attending: Radiation Oncology | Admitting: Radiation Oncology

## 2017-04-08 DIAGNOSIS — Z7982 Long term (current) use of aspirin: Secondary | ICD-10-CM | POA: Diagnosis not present

## 2017-04-08 DIAGNOSIS — C61 Malignant neoplasm of prostate: Secondary | ICD-10-CM | POA: Diagnosis not present

## 2017-04-08 DIAGNOSIS — H9190 Unspecified hearing loss, unspecified ear: Secondary | ICD-10-CM | POA: Diagnosis not present

## 2017-04-08 DIAGNOSIS — Z510 Encounter for antineoplastic radiation therapy: Secondary | ICD-10-CM | POA: Diagnosis not present

## 2017-04-08 DIAGNOSIS — E785 Hyperlipidemia, unspecified: Secondary | ICD-10-CM | POA: Diagnosis not present

## 2017-04-08 DIAGNOSIS — I2510 Atherosclerotic heart disease of native coronary artery without angina pectoris: Secondary | ICD-10-CM | POA: Diagnosis not present

## 2017-04-08 DIAGNOSIS — N529 Male erectile dysfunction, unspecified: Secondary | ICD-10-CM | POA: Diagnosis not present

## 2017-04-08 DIAGNOSIS — Z87891 Personal history of nicotine dependence: Secondary | ICD-10-CM | POA: Diagnosis not present

## 2017-04-08 DIAGNOSIS — K219 Gastro-esophageal reflux disease without esophagitis: Secondary | ICD-10-CM | POA: Diagnosis not present

## 2017-04-10 ENCOUNTER — Ambulatory Visit
Admission: RE | Admit: 2017-04-10 | Discharge: 2017-04-10 | Disposition: A | Discharge status: Home / Self Care | Payer: Medicare HMO | Source: Ambulatory Visit | Attending: Radiation Oncology | Admitting: Radiation Oncology

## 2017-04-10 DIAGNOSIS — H9190 Unspecified hearing loss, unspecified ear: Secondary | ICD-10-CM | POA: Diagnosis not present

## 2017-04-10 DIAGNOSIS — E785 Hyperlipidemia, unspecified: Secondary | ICD-10-CM | POA: Diagnosis not present

## 2017-04-10 DIAGNOSIS — C61 Malignant neoplasm of prostate: Secondary | ICD-10-CM | POA: Diagnosis not present

## 2017-04-10 DIAGNOSIS — I2510 Atherosclerotic heart disease of native coronary artery without angina pectoris: Secondary | ICD-10-CM | POA: Diagnosis not present

## 2017-04-10 DIAGNOSIS — Z7982 Long term (current) use of aspirin: Secondary | ICD-10-CM | POA: Diagnosis not present

## 2017-04-10 DIAGNOSIS — Z87891 Personal history of nicotine dependence: Secondary | ICD-10-CM | POA: Diagnosis not present

## 2017-04-10 DIAGNOSIS — K219 Gastro-esophageal reflux disease without esophagitis: Secondary | ICD-10-CM | POA: Diagnosis not present

## 2017-04-10 DIAGNOSIS — Z510 Encounter for antineoplastic radiation therapy: Secondary | ICD-10-CM | POA: Diagnosis not present

## 2017-04-10 DIAGNOSIS — N529 Male erectile dysfunction, unspecified: Secondary | ICD-10-CM | POA: Diagnosis not present

## 2017-04-11 ENCOUNTER — Ambulatory Visit
Admission: RE | Admit: 2017-04-11 | Discharge: 2017-04-11 | Disposition: A | Discharge status: Home / Self Care | Payer: Medicare HMO | Source: Ambulatory Visit | Attending: Radiation Oncology | Admitting: Radiation Oncology

## 2017-04-11 DIAGNOSIS — Z7982 Long term (current) use of aspirin: Secondary | ICD-10-CM | POA: Diagnosis not present

## 2017-04-11 DIAGNOSIS — H9190 Unspecified hearing loss, unspecified ear: Secondary | ICD-10-CM | POA: Diagnosis not present

## 2017-04-11 DIAGNOSIS — C61 Malignant neoplasm of prostate: Secondary | ICD-10-CM | POA: Diagnosis not present

## 2017-04-11 DIAGNOSIS — I2510 Atherosclerotic heart disease of native coronary artery without angina pectoris: Secondary | ICD-10-CM | POA: Diagnosis not present

## 2017-04-11 DIAGNOSIS — K219 Gastro-esophageal reflux disease without esophagitis: Secondary | ICD-10-CM | POA: Diagnosis not present

## 2017-04-11 DIAGNOSIS — Z510 Encounter for antineoplastic radiation therapy: Secondary | ICD-10-CM | POA: Diagnosis not present

## 2017-04-11 DIAGNOSIS — N529 Male erectile dysfunction, unspecified: Secondary | ICD-10-CM | POA: Diagnosis not present

## 2017-04-11 DIAGNOSIS — Z87891 Personal history of nicotine dependence: Secondary | ICD-10-CM | POA: Diagnosis not present

## 2017-04-11 DIAGNOSIS — E785 Hyperlipidemia, unspecified: Secondary | ICD-10-CM | POA: Diagnosis not present

## 2017-04-12 ENCOUNTER — Ambulatory Visit
Admission: RE | Admit: 2017-04-12 | Discharge: 2017-04-12 | Disposition: A | Discharge status: Home / Self Care | Payer: Medicare HMO | Source: Ambulatory Visit | Attending: Radiation Oncology | Admitting: Radiation Oncology

## 2017-04-12 DIAGNOSIS — K219 Gastro-esophageal reflux disease without esophagitis: Secondary | ICD-10-CM | POA: Diagnosis not present

## 2017-04-12 DIAGNOSIS — C61 Malignant neoplasm of prostate: Secondary | ICD-10-CM | POA: Diagnosis not present

## 2017-04-12 DIAGNOSIS — E785 Hyperlipidemia, unspecified: Secondary | ICD-10-CM | POA: Diagnosis not present

## 2017-04-12 DIAGNOSIS — Z87891 Personal history of nicotine dependence: Secondary | ICD-10-CM | POA: Diagnosis not present

## 2017-04-12 DIAGNOSIS — N529 Male erectile dysfunction, unspecified: Secondary | ICD-10-CM | POA: Diagnosis not present

## 2017-04-12 DIAGNOSIS — H9190 Unspecified hearing loss, unspecified ear: Secondary | ICD-10-CM | POA: Diagnosis not present

## 2017-04-12 DIAGNOSIS — Z7982 Long term (current) use of aspirin: Secondary | ICD-10-CM | POA: Diagnosis not present

## 2017-04-12 DIAGNOSIS — I2510 Atherosclerotic heart disease of native coronary artery without angina pectoris: Secondary | ICD-10-CM | POA: Diagnosis not present

## 2017-04-12 DIAGNOSIS — Z510 Encounter for antineoplastic radiation therapy: Secondary | ICD-10-CM | POA: Diagnosis not present

## 2017-04-13 ENCOUNTER — Ambulatory Visit
Admission: RE | Admit: 2017-04-13 | Discharge: 2017-04-13 | Disposition: A | Discharge status: Home / Self Care | Payer: Medicare HMO | Source: Ambulatory Visit | Attending: Radiation Oncology | Admitting: Radiation Oncology

## 2017-04-13 DIAGNOSIS — K219 Gastro-esophageal reflux disease without esophagitis: Secondary | ICD-10-CM | POA: Diagnosis not present

## 2017-04-13 DIAGNOSIS — Z510 Encounter for antineoplastic radiation therapy: Secondary | ICD-10-CM | POA: Diagnosis not present

## 2017-04-13 DIAGNOSIS — I2510 Atherosclerotic heart disease of native coronary artery without angina pectoris: Secondary | ICD-10-CM | POA: Diagnosis not present

## 2017-04-13 DIAGNOSIS — N529 Male erectile dysfunction, unspecified: Secondary | ICD-10-CM | POA: Diagnosis not present

## 2017-04-13 DIAGNOSIS — H9190 Unspecified hearing loss, unspecified ear: Secondary | ICD-10-CM | POA: Diagnosis not present

## 2017-04-13 DIAGNOSIS — E785 Hyperlipidemia, unspecified: Secondary | ICD-10-CM | POA: Diagnosis not present

## 2017-04-13 DIAGNOSIS — C61 Malignant neoplasm of prostate: Secondary | ICD-10-CM | POA: Diagnosis not present

## 2017-04-13 DIAGNOSIS — Z7982 Long term (current) use of aspirin: Secondary | ICD-10-CM | POA: Diagnosis not present

## 2017-04-13 DIAGNOSIS — Z87891 Personal history of nicotine dependence: Secondary | ICD-10-CM | POA: Diagnosis not present

## 2017-04-18 ENCOUNTER — Ambulatory Visit
Admission: RE | Admit: 2017-04-18 | Discharge: 2017-04-18 | Disposition: A | Discharge status: Home / Self Care | Payer: Medicare HMO | Source: Ambulatory Visit | Attending: Radiation Oncology | Admitting: Radiation Oncology

## 2017-04-18 DIAGNOSIS — I2510 Atherosclerotic heart disease of native coronary artery without angina pectoris: Secondary | ICD-10-CM | POA: Diagnosis not present

## 2017-04-18 DIAGNOSIS — H9190 Unspecified hearing loss, unspecified ear: Secondary | ICD-10-CM | POA: Diagnosis not present

## 2017-04-18 DIAGNOSIS — Z87891 Personal history of nicotine dependence: Secondary | ICD-10-CM | POA: Diagnosis not present

## 2017-04-18 DIAGNOSIS — N529 Male erectile dysfunction, unspecified: Secondary | ICD-10-CM | POA: Diagnosis not present

## 2017-04-18 DIAGNOSIS — K219 Gastro-esophageal reflux disease without esophagitis: Secondary | ICD-10-CM | POA: Diagnosis not present

## 2017-04-18 DIAGNOSIS — Z7982 Long term (current) use of aspirin: Secondary | ICD-10-CM | POA: Diagnosis not present

## 2017-04-18 DIAGNOSIS — C61 Malignant neoplasm of prostate: Secondary | ICD-10-CM | POA: Diagnosis not present

## 2017-04-18 DIAGNOSIS — E785 Hyperlipidemia, unspecified: Secondary | ICD-10-CM | POA: Diagnosis not present

## 2017-04-18 DIAGNOSIS — Z510 Encounter for antineoplastic radiation therapy: Secondary | ICD-10-CM | POA: Diagnosis not present

## 2017-04-19 ENCOUNTER — Ambulatory Visit: Payer: Medicare HMO

## 2017-04-19 ENCOUNTER — Ambulatory Visit
Admission: RE | Admit: 2017-04-19 | Discharge: 2017-04-19 | Disposition: A | Discharge status: Home / Self Care | Payer: Medicare HMO | Source: Ambulatory Visit | Attending: Radiation Oncology | Admitting: Radiation Oncology

## 2017-04-19 DIAGNOSIS — H9190 Unspecified hearing loss, unspecified ear: Secondary | ICD-10-CM | POA: Diagnosis not present

## 2017-04-19 DIAGNOSIS — C61 Malignant neoplasm of prostate: Secondary | ICD-10-CM | POA: Diagnosis not present

## 2017-04-19 DIAGNOSIS — Z7982 Long term (current) use of aspirin: Secondary | ICD-10-CM | POA: Diagnosis not present

## 2017-04-19 DIAGNOSIS — Z87891 Personal history of nicotine dependence: Secondary | ICD-10-CM | POA: Diagnosis not present

## 2017-04-19 DIAGNOSIS — Z510 Encounter for antineoplastic radiation therapy: Secondary | ICD-10-CM | POA: Diagnosis not present

## 2017-04-19 DIAGNOSIS — E785 Hyperlipidemia, unspecified: Secondary | ICD-10-CM | POA: Diagnosis not present

## 2017-04-19 DIAGNOSIS — N529 Male erectile dysfunction, unspecified: Secondary | ICD-10-CM | POA: Diagnosis not present

## 2017-04-19 DIAGNOSIS — I2510 Atherosclerotic heart disease of native coronary artery without angina pectoris: Secondary | ICD-10-CM | POA: Diagnosis not present

## 2017-04-19 DIAGNOSIS — K219 Gastro-esophageal reflux disease without esophagitis: Secondary | ICD-10-CM | POA: Diagnosis not present

## 2017-04-20 ENCOUNTER — Ambulatory Visit
Admission: RE | Admit: 2017-04-20 | Discharge: 2017-04-20 | Disposition: A | Discharge status: Home / Self Care | Payer: Medicare HMO | Source: Ambulatory Visit | Attending: Radiation Oncology | Admitting: Radiation Oncology

## 2017-04-20 DIAGNOSIS — I2510 Atherosclerotic heart disease of native coronary artery without angina pectoris: Secondary | ICD-10-CM | POA: Diagnosis not present

## 2017-04-20 DIAGNOSIS — Z510 Encounter for antineoplastic radiation therapy: Secondary | ICD-10-CM | POA: Diagnosis not present

## 2017-04-20 DIAGNOSIS — K219 Gastro-esophageal reflux disease without esophagitis: Secondary | ICD-10-CM | POA: Diagnosis not present

## 2017-04-20 DIAGNOSIS — H9190 Unspecified hearing loss, unspecified ear: Secondary | ICD-10-CM | POA: Diagnosis not present

## 2017-04-20 DIAGNOSIS — E785 Hyperlipidemia, unspecified: Secondary | ICD-10-CM | POA: Diagnosis not present

## 2017-04-20 DIAGNOSIS — C61 Malignant neoplasm of prostate: Secondary | ICD-10-CM | POA: Diagnosis not present

## 2017-04-20 DIAGNOSIS — Z7982 Long term (current) use of aspirin: Secondary | ICD-10-CM | POA: Diagnosis not present

## 2017-04-20 DIAGNOSIS — N529 Male erectile dysfunction, unspecified: Secondary | ICD-10-CM | POA: Diagnosis not present

## 2017-04-20 DIAGNOSIS — Z87891 Personal history of nicotine dependence: Secondary | ICD-10-CM | POA: Diagnosis not present

## 2017-04-21 ENCOUNTER — Ambulatory Visit
Admission: RE | Admit: 2017-04-21 | Discharge: 2017-04-21 | Disposition: A | Discharge status: Home / Self Care | Payer: Medicare HMO | Source: Ambulatory Visit | Attending: Radiation Oncology | Admitting: Radiation Oncology

## 2017-04-21 DIAGNOSIS — Z87891 Personal history of nicotine dependence: Secondary | ICD-10-CM | POA: Diagnosis not present

## 2017-04-21 DIAGNOSIS — I2510 Atherosclerotic heart disease of native coronary artery without angina pectoris: Secondary | ICD-10-CM | POA: Diagnosis not present

## 2017-04-21 DIAGNOSIS — Z510 Encounter for antineoplastic radiation therapy: Secondary | ICD-10-CM | POA: Diagnosis not present

## 2017-04-21 DIAGNOSIS — K219 Gastro-esophageal reflux disease without esophagitis: Secondary | ICD-10-CM | POA: Diagnosis not present

## 2017-04-21 DIAGNOSIS — N529 Male erectile dysfunction, unspecified: Secondary | ICD-10-CM | POA: Diagnosis not present

## 2017-04-21 DIAGNOSIS — H9190 Unspecified hearing loss, unspecified ear: Secondary | ICD-10-CM | POA: Diagnosis not present

## 2017-04-21 DIAGNOSIS — C61 Malignant neoplasm of prostate: Secondary | ICD-10-CM | POA: Diagnosis not present

## 2017-04-21 DIAGNOSIS — E785 Hyperlipidemia, unspecified: Secondary | ICD-10-CM | POA: Diagnosis not present

## 2017-04-21 DIAGNOSIS — Z7982 Long term (current) use of aspirin: Secondary | ICD-10-CM | POA: Diagnosis not present

## 2017-04-22 ENCOUNTER — Ambulatory Visit
Admission: RE | Admit: 2017-04-22 | Discharge: 2017-04-22 | Disposition: A | Discharge status: Home / Self Care | Payer: Medicare HMO | Source: Ambulatory Visit | Attending: Radiation Oncology | Admitting: Radiation Oncology

## 2017-04-22 DIAGNOSIS — C61 Malignant neoplasm of prostate: Secondary | ICD-10-CM | POA: Diagnosis not present

## 2017-04-22 DIAGNOSIS — I2510 Atherosclerotic heart disease of native coronary artery without angina pectoris: Secondary | ICD-10-CM | POA: Diagnosis not present

## 2017-04-22 DIAGNOSIS — H9190 Unspecified hearing loss, unspecified ear: Secondary | ICD-10-CM | POA: Diagnosis not present

## 2017-04-22 DIAGNOSIS — Z510 Encounter for antineoplastic radiation therapy: Secondary | ICD-10-CM | POA: Diagnosis not present

## 2017-04-22 DIAGNOSIS — Z87891 Personal history of nicotine dependence: Secondary | ICD-10-CM | POA: Diagnosis not present

## 2017-04-22 DIAGNOSIS — K219 Gastro-esophageal reflux disease without esophagitis: Secondary | ICD-10-CM | POA: Diagnosis not present

## 2017-04-22 DIAGNOSIS — Z7982 Long term (current) use of aspirin: Secondary | ICD-10-CM | POA: Diagnosis not present

## 2017-04-22 DIAGNOSIS — N529 Male erectile dysfunction, unspecified: Secondary | ICD-10-CM | POA: Diagnosis not present

## 2017-04-22 DIAGNOSIS — E785 Hyperlipidemia, unspecified: Secondary | ICD-10-CM | POA: Diagnosis not present

## 2017-04-25 ENCOUNTER — Ambulatory Visit
Admission: RE | Admit: 2017-04-25 | Discharge: 2017-04-25 | Disposition: A | Discharge status: Home / Self Care | Payer: Medicare HMO | Source: Ambulatory Visit | Attending: Radiation Oncology | Admitting: Radiation Oncology

## 2017-04-25 DIAGNOSIS — N529 Male erectile dysfunction, unspecified: Secondary | ICD-10-CM | POA: Diagnosis not present

## 2017-04-25 DIAGNOSIS — Z87891 Personal history of nicotine dependence: Secondary | ICD-10-CM | POA: Diagnosis not present

## 2017-04-25 DIAGNOSIS — K219 Gastro-esophageal reflux disease without esophagitis: Secondary | ICD-10-CM | POA: Diagnosis not present

## 2017-04-25 DIAGNOSIS — Z7982 Long term (current) use of aspirin: Secondary | ICD-10-CM | POA: Diagnosis not present

## 2017-04-25 DIAGNOSIS — C61 Malignant neoplasm of prostate: Secondary | ICD-10-CM | POA: Diagnosis not present

## 2017-04-25 DIAGNOSIS — I2510 Atherosclerotic heart disease of native coronary artery without angina pectoris: Secondary | ICD-10-CM | POA: Diagnosis not present

## 2017-04-25 DIAGNOSIS — E785 Hyperlipidemia, unspecified: Secondary | ICD-10-CM | POA: Diagnosis not present

## 2017-04-25 DIAGNOSIS — Z510 Encounter for antineoplastic radiation therapy: Secondary | ICD-10-CM | POA: Diagnosis not present

## 2017-04-25 DIAGNOSIS — H9190 Unspecified hearing loss, unspecified ear: Secondary | ICD-10-CM | POA: Diagnosis not present

## 2017-04-26 ENCOUNTER — Ambulatory Visit
Admission: RE | Admit: 2017-04-26 | Discharge: 2017-04-26 | Disposition: A | Discharge status: Home / Self Care | Payer: Medicare HMO | Source: Ambulatory Visit | Attending: Radiation Oncology | Admitting: Radiation Oncology

## 2017-04-26 DIAGNOSIS — Z7982 Long term (current) use of aspirin: Secondary | ICD-10-CM | POA: Diagnosis not present

## 2017-04-26 DIAGNOSIS — Z87891 Personal history of nicotine dependence: Secondary | ICD-10-CM | POA: Diagnosis not present

## 2017-04-26 DIAGNOSIS — Z510 Encounter for antineoplastic radiation therapy: Secondary | ICD-10-CM | POA: Diagnosis not present

## 2017-04-26 DIAGNOSIS — E785 Hyperlipidemia, unspecified: Secondary | ICD-10-CM | POA: Diagnosis not present

## 2017-04-26 DIAGNOSIS — C61 Malignant neoplasm of prostate: Secondary | ICD-10-CM | POA: Diagnosis not present

## 2017-04-26 DIAGNOSIS — N529 Male erectile dysfunction, unspecified: Secondary | ICD-10-CM | POA: Diagnosis not present

## 2017-04-26 DIAGNOSIS — I2510 Atherosclerotic heart disease of native coronary artery without angina pectoris: Secondary | ICD-10-CM | POA: Diagnosis not present

## 2017-04-26 DIAGNOSIS — H9190 Unspecified hearing loss, unspecified ear: Secondary | ICD-10-CM | POA: Diagnosis not present

## 2017-04-26 DIAGNOSIS — K219 Gastro-esophageal reflux disease without esophagitis: Secondary | ICD-10-CM | POA: Diagnosis not present

## 2017-04-27 ENCOUNTER — Ambulatory Visit
Admission: RE | Admit: 2017-04-27 | Discharge: 2017-04-27 | Disposition: A | Discharge status: Home / Self Care | Payer: Medicare HMO | Source: Ambulatory Visit | Attending: Radiation Oncology | Admitting: Radiation Oncology

## 2017-04-27 DIAGNOSIS — Z510 Encounter for antineoplastic radiation therapy: Secondary | ICD-10-CM | POA: Diagnosis not present

## 2017-04-27 DIAGNOSIS — Z7982 Long term (current) use of aspirin: Secondary | ICD-10-CM | POA: Diagnosis not present

## 2017-04-27 DIAGNOSIS — E785 Hyperlipidemia, unspecified: Secondary | ICD-10-CM | POA: Diagnosis not present

## 2017-04-27 DIAGNOSIS — I2510 Atherosclerotic heart disease of native coronary artery without angina pectoris: Secondary | ICD-10-CM | POA: Diagnosis not present

## 2017-04-27 DIAGNOSIS — K219 Gastro-esophageal reflux disease without esophagitis: Secondary | ICD-10-CM | POA: Diagnosis not present

## 2017-04-27 DIAGNOSIS — Z87891 Personal history of nicotine dependence: Secondary | ICD-10-CM | POA: Diagnosis not present

## 2017-04-27 DIAGNOSIS — C61 Malignant neoplasm of prostate: Secondary | ICD-10-CM | POA: Diagnosis not present

## 2017-04-27 DIAGNOSIS — N529 Male erectile dysfunction, unspecified: Secondary | ICD-10-CM | POA: Diagnosis not present

## 2017-04-27 DIAGNOSIS — H9190 Unspecified hearing loss, unspecified ear: Secondary | ICD-10-CM | POA: Diagnosis not present

## 2017-04-28 ENCOUNTER — Ambulatory Visit
Admission: RE | Admit: 2017-04-28 | Discharge: 2017-04-28 | Disposition: A | Discharge status: Home / Self Care | Payer: Medicare HMO | Source: Ambulatory Visit | Attending: Radiation Oncology | Admitting: Radiation Oncology

## 2017-04-28 DIAGNOSIS — Z510 Encounter for antineoplastic radiation therapy: Secondary | ICD-10-CM | POA: Diagnosis not present

## 2017-04-28 DIAGNOSIS — Z87891 Personal history of nicotine dependence: Secondary | ICD-10-CM | POA: Diagnosis not present

## 2017-04-28 DIAGNOSIS — E785 Hyperlipidemia, unspecified: Secondary | ICD-10-CM | POA: Diagnosis not present

## 2017-04-28 DIAGNOSIS — K219 Gastro-esophageal reflux disease without esophagitis: Secondary | ICD-10-CM | POA: Diagnosis not present

## 2017-04-28 DIAGNOSIS — N529 Male erectile dysfunction, unspecified: Secondary | ICD-10-CM | POA: Diagnosis not present

## 2017-04-28 DIAGNOSIS — Z7982 Long term (current) use of aspirin: Secondary | ICD-10-CM | POA: Diagnosis not present

## 2017-04-28 DIAGNOSIS — I2510 Atherosclerotic heart disease of native coronary artery without angina pectoris: Secondary | ICD-10-CM | POA: Diagnosis not present

## 2017-04-28 DIAGNOSIS — H9190 Unspecified hearing loss, unspecified ear: Secondary | ICD-10-CM | POA: Diagnosis not present

## 2017-04-28 DIAGNOSIS — C61 Malignant neoplasm of prostate: Secondary | ICD-10-CM | POA: Diagnosis not present

## 2017-04-29 ENCOUNTER — Ambulatory Visit
Admission: RE | Admit: 2017-04-29 | Discharge: 2017-04-29 | Disposition: A | Discharge status: Home / Self Care | Payer: Medicare HMO | Source: Ambulatory Visit | Attending: Radiation Oncology | Admitting: Radiation Oncology

## 2017-04-29 ENCOUNTER — Encounter: Payer: Self-pay | Admitting: Radiation Oncology

## 2017-04-29 DIAGNOSIS — N529 Male erectile dysfunction, unspecified: Secondary | ICD-10-CM | POA: Diagnosis not present

## 2017-04-29 DIAGNOSIS — K219 Gastro-esophageal reflux disease without esophagitis: Secondary | ICD-10-CM | POA: Diagnosis not present

## 2017-04-29 DIAGNOSIS — H9190 Unspecified hearing loss, unspecified ear: Secondary | ICD-10-CM | POA: Diagnosis not present

## 2017-04-29 DIAGNOSIS — I2510 Atherosclerotic heart disease of native coronary artery without angina pectoris: Secondary | ICD-10-CM | POA: Diagnosis not present

## 2017-04-29 DIAGNOSIS — Z510 Encounter for antineoplastic radiation therapy: Secondary | ICD-10-CM | POA: Diagnosis not present

## 2017-04-29 DIAGNOSIS — Z7982 Long term (current) use of aspirin: Secondary | ICD-10-CM | POA: Diagnosis not present

## 2017-04-29 DIAGNOSIS — Z87891 Personal history of nicotine dependence: Secondary | ICD-10-CM | POA: Diagnosis not present

## 2017-04-29 DIAGNOSIS — E785 Hyperlipidemia, unspecified: Secondary | ICD-10-CM | POA: Diagnosis not present

## 2017-04-29 DIAGNOSIS — C61 Malignant neoplasm of prostate: Secondary | ICD-10-CM | POA: Diagnosis not present

## 2017-05-02 ENCOUNTER — Ambulatory Visit: Payer: Medicare HMO

## 2017-05-03 ENCOUNTER — Ambulatory Visit: Payer: Medicare HMO

## 2017-05-04 ENCOUNTER — Ambulatory Visit: Payer: Medicare HMO

## 2017-05-04 NOTE — Progress Notes (Signed)
  Radiation Oncology         [phone] ________________________________  Name: Carl Harrell MRN: [id number]  Date: 04/29/2017  DOB: June 12, 1945  End of Treatment Note  Diagnosis:   71 y.o. gentleman with Stage T1c adenocarcinoma of the prostate with Gleason Score of 4+3, and PSA of 7.38     Indication for treatment:  Curative, Definitive Radiotherapy       Radiation treatment dates:   03/08/2017 - 04/29/2017  Site/dose:   The prostate was treated to 78 Gy in 40 fractions of 1.95 Gy  Beams/energy:   The patient was treated with IMRT using volumetric arc therapy delivering 6 MV X-rays to clockwise and counterclockwise circumferential arcs with a 90 degree collimator offset to avoid dose scalloping.  Image guidance was performed with daily cone beam CT prior to each fraction to align to gold markers in the prostate and assure proper bladder and rectal fill volumes.  Immobilization was achieved with BodyFix custom mold.  Narrative: The patient tolerated radiation treatment relatively well.  He experienced modest fatigue and some urinary irritation with symptoms of frequency, nocturia, urgency, straining to void, incomplete emptying, moderate dysuria, occasional leakage and hesitancy.  His urinary symptoms were managed with Flomax BID which he tolerated well. He denied hematuria. He also reported soft bowel movements that worsened to more frequent episodes of diarrhea towards the end of treatment.   Plan: The patient has completed radiation treatment. He will return to radiation oncology clinic for routine followup in one month. I advised him to call or return sooner if he has any questions or concerns related to his recovery or treatment. ________________________________  Sheral Apley. Tammi Klippel, M.D.  This document serves as a record of services personally performed by Tyler Pita, MD. It was created on his behalf by Rae Lips, a trained medical scribe. The creation of this record is  based on the scribe's personal observations and the provider's statements to them. This document has been checked and approved by the attending provider.

## 2017-05-31 NOTE — Progress Notes (Signed)
Radiation Oncology         [phone]) [phone] ________________________________  Name: Carl Harrell MRN: [id number]  Date: 06/01/2017  DOB: Aug 09, 1945  Post Treatment Note  CC: Lucretia Kern, DO  [name], MD  Diagnosis:   72 y.o. gentleman with Stage T1c adenocarcinoma of the prostate with Gleason Score of 4+3, and PSA of 7.38  Interval Since Last Radiation:  5 weeks  03/08/2017 - 04/29/2017:  The prostate was treated to 78 Gy in 40 fractions of 1.95 Gy  Narrative:  The patient returns today for routine follow-up.  He tolerated radiation treatment relatively well.  He experienced modest fatigue and some urinary irritation with symptoms of frequency, nocturia, urgency, straining to void, incomplete emptying, moderate dysuria, occasional leakage and hesitancy.  His urinary symptoms were managed with Flomax BID which he tolerated well. He denied hematuria. He also reported soft bowel movements that worsened to more frequent episodes of diarrhea towards the end of treatment.                              On review of systems, the patient states that he is doing well overall. He has had significant improvement in his lower urinary tract symptoms, with a current IPSS of 8 indicating moderate urinary symptoms, which is similar to his pretreatment score. He continues with urgency and a mildly weak stream but feels this is improving gradually. He reports nocturia 2 per night. He continues taking Flomax after supper which he feels helps with emptying his bladder and improving his force of stream.  He specifically denies dysuria, gross hematuria, excessive daytime frequency or incontinence. He has had gradual improvement in his bowel movements but continues to use Imodium on occasion as needed.  He denies abdominal pain, nausea or vomiting. He reports a healthy appetite and is maintaining his weight. He has not appreciated any significant decrease in his energy level.  ALLERGIES:  is allergic to  lisinopril.  Meds: Current Outpatient Medications  Medication Sig Dispense Refill  . ezetimibe (ZETIA) 10 MG tablet Take 1 tablet (10 mg total) by mouth daily. 90 tablet 3  . loratadine (CLARITIN) 10 MG tablet Take 10 mg by mouth daily.    . metoprolol tartrate (LOPRESSOR) 25 MG tablet take 1 tablet by mouth twice a day (Patient taking differently: Take 25mg  tablet by mouth twice a day) 180 tablet 5  . Multiple Vitamin (MULTIVITAMIN WITH MINERALS) TABS tablet Take 1 tablet by mouth daily.    Marland Kitchen omeprazole (PRILOSEC) 20 MG capsule take 1 capsule by mouth twice a day (Patient taking differently: take 40mg  by mouth once daily) 100 capsule 4  . simvastatin (ZOCOR) 40 MG tablet take 1 tablet by mouth at bedtime 90 tablet 3  . tamsulosin (FLOMAX) 0.4 MG CAPS capsule Take 1 capsule (0.4 mg total) 2 (two) times daily by mouth. 60 capsule 5  . fexofenadine (ALLEGRA) 180 MG tablet Take 180 mg by mouth daily.     Marland Kitchen NITROSTAT 0.4 MG SL tablet PLACE 1 TABLET BY MOUTH UNDER TONGUE EVERY 5 MINUTES AS NEEDED FOR CHEST PAIN (Patient not taking: Reported on 06/01/2017) 25 tablet 12  . polyethylene glycol (MIRALAX / GLYCOLAX) packet Take 17 g by mouth 3 (three) times daily. (Patient not taking: Reported on 06/01/2017) 14 each 0   No current facility-administered medications for this encounter.     Physical Findings:  height is 5\' 7"  (1.702 m) and weight  is 210 lb (95.3 kg). His oral temperature is 98.2 F (36.8 C). His blood pressure is 121/86 and his pulse is 68. His respiration is 20 and oxygen saturation is 97%.  Pain Assessment Pain Score: 0-No pain/10 In general this is a well appearing Caucasian male in no acute distress. He's alert and oriented x4 and appropriate throughout the examination. Cardiopulmonary assessment is negative for acute distress and he exhibits normal effort.   Lab Findings: Lab Results  Component Value Date   WBC 8.5 02/22/2017   HGB 12.8 (L) 02/22/2017   HCT 39.2 02/22/2017   MCV  85.4 02/22/2017   PLT 259 02/22/2017     Radiographic Findings: No results found.  Impression/Plan: 1. 72 y.o. gentleman with Stage T1c adenocarcinoma of the prostate with Gleason Score of 4+3, and PSA of 7.38. He will continue to follow up with urology for ongoing PSA determinations and anticipates a follow up appointment with Dr. Karsten Ro in March 2019.  He does not have a scheduled appointment at this time so I have recommended that he call [facility] by early next week if he has not heard from their schedulers. He will continue taking Flomax daily until his follow-up appointment with Dr. Karsten Ro and they will discuss at that point when it may be appropriate to try discontinuing this medication. He understands what to expect with regards to PSA monitoring going forward. I will look forward to following his response to treatment via correspondence with urology, and would be happy to continue to participate in his care if clinically indicated. I talked to the patient about what to expect in the future, including his risk for erectile dysfunction and rectal bleeding. I encouraged him to call or return to the office if he has any questions regarding his previous radiation or possible radiation side effects. He was comfortable with this plan and will follow up as needed.     Nicholos Johns, PA-C

## 2017-06-01 ENCOUNTER — Ambulatory Visit
Admission: RE | Admit: 2017-06-01 | Discharge: 2017-06-01 | Disposition: A | Discharge status: Home / Self Care | Payer: Medicare HMO | Source: Ambulatory Visit | Attending: Radiation Oncology | Admitting: Radiation Oncology

## 2017-06-01 ENCOUNTER — Encounter: Payer: Self-pay | Admitting: Urology

## 2017-06-01 ENCOUNTER — Other Ambulatory Visit: Payer: Self-pay

## 2017-06-01 VITALS — BP 121/86 | HR 68 | Temp 98.2°F | Resp 20 | Ht 67.0 in | Wt 210.0 lb

## 2017-06-01 DIAGNOSIS — R197 Diarrhea, unspecified: Secondary | ICD-10-CM | POA: Insufficient documentation

## 2017-06-01 DIAGNOSIS — Z923 Personal history of irradiation: Secondary | ICD-10-CM | POA: Diagnosis not present

## 2017-06-01 DIAGNOSIS — Z888 Allergy status to other drugs, medicaments and biological substances status: Secondary | ICD-10-CM | POA: Diagnosis not present

## 2017-06-01 DIAGNOSIS — Z79899 Other long term (current) drug therapy: Secondary | ICD-10-CM | POA: Diagnosis not present

## 2017-06-01 DIAGNOSIS — Y842 Radiological procedure and radiotherapy as the cause of abnormal reaction of the patient, or of later complication, without mention of misadventure at the time of the procedure: Secondary | ICD-10-CM | POA: Insufficient documentation

## 2017-06-01 DIAGNOSIS — R351 Nocturia: Secondary | ICD-10-CM | POA: Diagnosis not present

## 2017-06-01 DIAGNOSIS — C61 Malignant neoplasm of prostate: Secondary | ICD-10-CM

## 2017-06-01 DIAGNOSIS — R3915 Urgency of urination: Secondary | ICD-10-CM | POA: Diagnosis not present

## 2017-06-01 NOTE — Addendum Note (Signed)
Encounter addended by: Malena Edman, RN on: 06/01/2017 10:14 AM  Actions taken: Charge Capture section accepted

## 2017-06-02 ENCOUNTER — Encounter: Payer: Self-pay | Admitting: Family Medicine

## 2017-06-07 NOTE — Progress Notes (Signed)
HPI:  Carl Harrell is a pleasant 72 y.o. here for follow up. Chronic medical problems summarized below were reviewed for changes.  Reports is doing well without concerns.  Reports he tolerated radiation treatment well and has follow-up with Dr. Tammi Klippel and his urologist planned. Denies CP, SOB, DOE, treatment intolerance or new symptoms.   Diabetes: -new diagnosis in 2018 and he preferred to treat with lifestyle changes -eye exam  CAD/HTN/HLD/Obesity: -sees cardiologist - Dr. Fransico Him -meds:asa, statin, zetia, losartan-hctz (? Held in hospital 10/18 and not restarted), metoprolol  GERD: -meds: prilosec  Hx Prostate Ca: -seeing urology/onc/rad onc for management -dx in 2018 -s/p radiation treatment 04/29/17  ROS: See pertinent positives and negatives per HPI.  Past Medical History:  Diagnosis Date  . ALLERGIC RHINITIS 01/30/2007  . Arthritis    "hands" (05/29/2014)  . Coronary artery disease    LHC (05/29/14): pLAD 50-70, pRCA 99 (L>R collats), EF 55% >> PCI: 3.5 x 28 mm Xience Alpine DES to [employer]  . Cough secondary to angiotensin converting enzyme inhibitor (ACE-I) 05/28/2013  . ED (erectile dysfunction)   . GERD 09/26/2007  . HYPERLIPIDEMIA 09/26/2007  . Hypertension   . PLANTAR FASCIITIS, BILATERAL 07/08/2009  . Unspecified hearing loss 06/16/2009   wears aides both ears    Past Surgical History:  Procedure Laterality Date  . CARDIAC CATHETERIZATION  05/29/2014   Procedure: CORONARY STENT INTERVENTION;  Surgeon: Sinclair Grooms, MD;  Location: [facility] CATH LAB;  Service: Cardiovascular;;  Prox RCA  . CORONARY ANGIOPLASTY WITH STENT PLACEMENT  05/29/2014   "1"  . KNEE ARTHROSCOPY Right ~ 2005  . LEFT HEART CATHETERIZATION WITH CORONARY ANGIOGRAM N/A 05/29/2014   Procedure: LEFT HEART CATHETERIZATION WITH CORONARY ANGIOGRAM;  Surgeon: Sinclair Grooms, MD;  Location: [facility] CATH LAB;  Service: Cardiovascular;  Laterality: N/A;    Family History  Problem Relation Age of Onset  .  Heart attack Mother   . Heart disease Mother   . Heart attack Father   . Heart disease Father   . COPD Neg Hx        family hx  . Hyperlipidemia Neg Hx        family hx    Social History   Socioeconomic History  . Marital status: Married    Spouse name: None  . Number of children: None  . Years of education: None  . Highest education level: None  Social Needs  . Financial resource strain: None  . Food insecurity - worry: None  . Food insecurity - inability: None  . Transportation needs - medical: None  . Transportation needs - non-medical: None  Occupational History  . None  Tobacco Use  . Smoking status: Former Smoker    Types: Cigarettes    Last attempt to quit: 05/24/1978    Years since quitting: 39.0  . Smokeless tobacco: Never Used  . Tobacco comment: 05/29/2014 "smoked socially; nothing since 1980"  Substance and Sexual Activity  . Alcohol use: Yes    Alcohol/week: 1.2 oz    Types: 2 Shots of liquor per week    Comment: beer or two a week  . Drug use: No  . Sexual activity: Not Currently  Other Topics Concern  . None  Social History Narrative   Lives with wife in a one story home.  Has 2 children.  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  Education: 3 years of college.    Does wood working.      Current Outpatient Medications:  .  ezetimibe (ZETIA) 10 MG tablet, Take 1 tablet (10 mg total) by mouth daily., Disp: 90 tablet, Rfl: 3 .  fexofenadine (ALLEGRA) 180 MG tablet, Take 180 mg by mouth daily. , Disp: , Rfl:  .  loratadine (CLARITIN) 10 MG tablet, Take 10 mg by mouth daily., Disp: , Rfl:  .  metoprolol tartrate (LOPRESSOR) 25 MG tablet, take 1 tablet by mouth twice a day (Patient taking differently: Take 25mg  tablet by mouth twice a day), Disp: 180 tablet, Rfl: 5 .  Multiple Vitamin (MULTIVITAMIN WITH MINERALS) TABS tablet, Take 1 tablet by mouth daily., Disp: , Rfl:  .  NITROSTAT 0.4 MG SL tablet, PLACE 1 TABLET BY MOUTH UNDER TONGUE EVERY 5 MINUTES AS NEEDED FOR CHEST  PAIN, Disp: 25 tablet, Rfl: 12 .  omeprazole (PRILOSEC) 20 MG capsule, take 1 capsule by mouth twice a day (Patient taking differently: take 40mg  by mouth once daily), Disp: 100 capsule, Rfl: 4 .  polyethylene glycol (MIRALAX / GLYCOLAX) packet, Take 17 g by mouth 3 (three) times daily., Disp: 14 each, Rfl: 0 .  simvastatin (ZOCOR) 40 MG tablet, take 1 tablet by mouth at bedtime, Disp: 90 tablet, Rfl: 3 .  tamsulosin (FLOMAX) 0.4 MG CAPS capsule, Take 1 capsule (0.4 mg total) 2 (two) times daily by mouth., Disp: 60 capsule, Rfl: 5  EXAM:  Vitals:   06/09/17 0851  BP: 100/68  Pulse: (!) 52  Temp: 97.6 F (36.4 C)  SpO2: 96%    Body mass index is 32.73 kg/m.  GENERAL: vitals reviewed and listed above, alert, oriented, appears well hydrated and in no acute distress  HEENT: atraumatic, conjunttiva clear, no obvious abnormalities on inspection of external nose and ears  NECK: no obvious masses on inspection  LUNGS: clear to auscultation bilaterally, no wheezes, rales or rhonchi, good air movement  CV: HRRR, no peripheral edema  MS: moves all extremities without noticeable abnormality  PSYCH: pleasant and cooperative, no obvious depression or anxiety  ASSESSMENT AND PLAN:  Discussed the following assessment and plan:  Type 2 diabetes mellitus without complication, without long-term current use of insulin (HCC) - Plan: Hemoglobin A1c, Microalbumin / creatinine urine ratio  Hyperlipidemia associated with type 2 diabetes mellitus (HCC)  Hypertension associated with diabetes ([place]) - Plan: Basic metabolic panel, CBC  Coronary artery disease involving native coronary artery of native heart without angina pectoris  -Labs today -Advised to schedule diabetic eye exam -Reports he has follow-up with his cardiologist soon -Healthy lifestyle advised -Prostate management through his specialist -Follow-up 3-4 months  Patient Instructions  BEFORE YOU LEAVE: -labs -follow up: 3-4  months  Please schedule your diabetic eye exam with your ophthalmologist.  We have ordered labs or studies at this visit. It can take up to 1-2 weeks for results and processing. IF results require follow up or explanation, we will call you with instructions. Clinically stable results will be released to your [facility]. If you have not heard from [place] or cannot find your results in [facility] in 2 weeks please contact our office at [phone].  If you are not yet signed up for [facility], please consider signing up.   We recommend the following healthy lifestyle for LIFE: 1) Small portions. But, make sure to get regular (at least 3 per day), healthy meals and small healthy snacks if needed.  2) Eat a healthy clean diet.   TRY TO EAT: -at least 5-7 servings of low sugar, colorful, and nutrient rich vegetables per day (not corn, potatoes or  bananas.) -berries are the best choice if you wish to eat fruit (only eat small amounts if trying to reduce weight)  -lean meets (fish, white meat of chicken or [place]) -vegan proteins for some meals - beans or tofu, whole grains, nuts and seeds -Replace bad fats with good fats - good fats include: fish, nuts and seeds, canola oil, olive oil -small amounts of low fat or non fat dairy -small amounts of100 % whole grains - check the lables -drink plenty of water  AVOID: -SUGAR, sweets, anything with added sugar, corn syrup or sweeteners - must read labels as even foods advertised as "healthy" often are loaded with sugar -if you must have a sweetener, small amounts of stevia may be best -sweetened beverages and artificially sweetened beverages -simple starches (rice, bread, potatoes, pasta, chips, etc - small amounts of 100% whole grains are ok) -red meat, pork, butter -fried foods, fast food, processed food, excessive dairy, eggs and coconut.  3)Get at least 150 minutes of sweaty aerobic exercise per week.  4)Reduce stress - consider counseling, meditation and  relaxation to balance other aspects of your life.    Colin Benton R., DO

## 2017-06-09 ENCOUNTER — Encounter: Payer: Self-pay | Admitting: Family Medicine

## 2017-06-09 ENCOUNTER — Ambulatory Visit (INDEPENDENT_AMBULATORY_CARE_PROVIDER_SITE_OTHER): Payer: Medicare HMO | Admitting: Family Medicine

## 2017-06-09 VITALS — BP 100/68 | HR 52 | Temp 97.6°F | Wt 209.0 lb

## 2017-06-09 DIAGNOSIS — E119 Type 2 diabetes mellitus without complications: Secondary | ICD-10-CM

## 2017-06-09 DIAGNOSIS — I10 Essential (primary) hypertension: Secondary | ICD-10-CM

## 2017-06-09 DIAGNOSIS — E1169 Type 2 diabetes mellitus with other specified complication: Secondary | ICD-10-CM | POA: Diagnosis not present

## 2017-06-09 DIAGNOSIS — I2510 Atherosclerotic heart disease of native coronary artery without angina pectoris: Secondary | ICD-10-CM | POA: Diagnosis not present

## 2017-06-09 DIAGNOSIS — E1159 Type 2 diabetes mellitus with other circulatory complications: Secondary | ICD-10-CM | POA: Diagnosis not present

## 2017-06-09 DIAGNOSIS — E785 Hyperlipidemia, unspecified: Secondary | ICD-10-CM | POA: Diagnosis not present

## 2017-06-09 DIAGNOSIS — I152 Hypertension secondary to endocrine disorders: Secondary | ICD-10-CM

## 2017-06-09 LAB — BASIC METABOLIC PANEL
BUN: 16 mg/dL (ref 6–23)
CHLORIDE: 101 meq/L (ref 96–112)
CO2: 31 meq/L (ref 19–32)
Calcium: 8.9 mg/dL (ref 8.4–10.5)
Creatinine, Ser: 1.16 mg/dL (ref 0.40–1.50)
GFR: 65.42 mL/min (ref >60.00)
GLUCOSE: 109 mg/dL — AB (ref 70–99)
POTASSIUM: 5 meq/L (ref 3.5–5.1)
SODIUM: 139 meq/L (ref 135–145)

## 2017-06-09 LAB — CBC
HEMATOCRIT: 46.8 % (ref 39.0–52.0)
HEMOGLOBIN: 15.1 g/dL (ref 13.0–17.0)
MCHC: 32.3 g/dL (ref 30.0–36.0)
MCV: 86.3 fl (ref 78.0–100.0)
PLATELETS: 256 10*3/uL (ref 150.0–400.0)
RBC: 5.43 Mil/uL (ref 4.22–5.81)
RDW: 14.7 % (ref 11.5–15.5)
WBC: 6.4 10*3/uL (ref 4.0–10.5)

## 2017-06-09 LAB — MICROALBUMIN / CREATININE URINE RATIO
CREATININE, U: 78.7 mg/dL
Microalb Creat Ratio: 0.9 mg/g (ref 0.0–30.0)
Microalb, Ur: <0.7 mg/dL (ref 0.0–1.9)

## 2017-06-09 LAB — HEMOGLOBIN A1C: Hgb A1c MFr Bld: 6.7 % — ABNORMAL HIGH (ref 4.6–6.5)

## 2017-06-09 NOTE — Patient Instructions (Addendum)
BEFORE YOU LEAVE: -labs -follow up: 3-4 months  Please schedule your diabetic eye exam with your ophthalmologist.  We have ordered labs or studies at this visit. It can take up to 1-2 weeks for results and processing. IF results require follow up or explanation, we will call you with instructions. Clinically stable results will be released to your [facility]. If you have not heard from [place] or cannot find your results in [facility] in 2 weeks please contact our office at [phone].  If you are not yet signed up for [facility], please consider signing up.   We recommend the following healthy lifestyle for LIFE: 1) Small portions. But, make sure to get regular (at least 3 per day), healthy meals and small healthy snacks if needed.  2) Eat a healthy clean diet.   TRY TO EAT: -at least 5-7 servings of low sugar, colorful, and nutrient rich vegetables per day (not corn, potatoes or bananas.) -berries are the best choice if you wish to eat fruit (only eat small amounts if trying to reduce weight)  -lean meets (fish, white meat of chicken or [place]) -vegan proteins for some meals - beans or tofu, whole grains, nuts and seeds -Replace bad fats with good fats - good fats include: fish, nuts and seeds, canola oil, olive oil -small amounts of low fat or non fat dairy -small amounts of100 % whole grains - check the lables -drink plenty of water  AVOID: -SUGAR, sweets, anything with added sugar, corn syrup or sweeteners - must read labels as even foods advertised as "healthy" often are loaded with sugar -if you must have a sweetener, small amounts of stevia may be best -sweetened beverages and artificially sweetened beverages -simple starches (rice, bread, potatoes, pasta, chips, etc - small amounts of 100% whole grains are ok) -red meat, pork, butter -fried foods, fast food, processed food, excessive dairy, eggs and coconut.  3)Get at least 150 minutes of sweaty aerobic exercise per week.  4)Reduce  stress - consider counseling, meditation and relaxation to balance other aspects of your life.

## 2017-07-08 ENCOUNTER — Other Ambulatory Visit: Payer: Self-pay | Admitting: Cardiology

## 2017-07-08 ENCOUNTER — Other Ambulatory Visit: Payer: Self-pay | Admitting: Family Medicine

## 2017-07-10 NOTE — Telephone Encounter (Signed)
Please check with patient to see what he is taking. He sees a cardiologist as well. BP was on low end last visit - so def don't want to rx something he is not taking. Ok to refill if he is sure is taking them every day.

## 2017-07-11 NOTE — Telephone Encounter (Signed)
I called the pt and he stated he is taking all three medications.  Patient is aware these were sent to his pharmacy.

## 2017-07-15 DIAGNOSIS — H02886 Meibomian gland dysfunction of left eye, unspecified eyelid: Secondary | ICD-10-CM | POA: Diagnosis not present

## 2017-07-15 DIAGNOSIS — H02883 Meibomian gland dysfunction of right eye, unspecified eyelid: Secondary | ICD-10-CM | POA: Diagnosis not present

## 2017-07-15 DIAGNOSIS — H01001 Unspecified blepharitis right upper eyelid: Secondary | ICD-10-CM | POA: Diagnosis not present

## 2017-07-15 DIAGNOSIS — H2513 Age-related nuclear cataract, bilateral: Secondary | ICD-10-CM | POA: Diagnosis not present

## 2017-07-15 LAB — HM DIABETES EYE EXAM

## 2017-08-02 DIAGNOSIS — C61 Malignant neoplasm of prostate: Secondary | ICD-10-CM | POA: Diagnosis not present

## 2017-08-02 DIAGNOSIS — Z8546 Personal history of malignant neoplasm of prostate: Secondary | ICD-10-CM | POA: Diagnosis not present

## 2017-08-02 DIAGNOSIS — R3915 Urgency of urination: Secondary | ICD-10-CM | POA: Diagnosis not present

## 2017-08-02 DIAGNOSIS — N401 Enlarged prostate with lower urinary tract symptoms: Secondary | ICD-10-CM | POA: Diagnosis not present

## 2017-08-24 ENCOUNTER — Ambulatory Visit: Payer: Medicare Other | Admitting: Cardiology

## 2017-08-24 ENCOUNTER — Encounter: Payer: Self-pay | Admitting: Cardiology

## 2017-08-24 VITALS — BP 117/77 | HR 80 | Ht 67.5 in | Wt 208.0 lb

## 2017-08-24 DIAGNOSIS — I2510 Atherosclerotic heart disease of native coronary artery without angina pectoris: Secondary | ICD-10-CM | POA: Diagnosis not present

## 2017-08-24 DIAGNOSIS — I10 Essential (primary) hypertension: Secondary | ICD-10-CM

## 2017-08-24 DIAGNOSIS — E785 Hyperlipidemia, unspecified: Secondary | ICD-10-CM

## 2017-08-24 DIAGNOSIS — E1159 Type 2 diabetes mellitus with other circulatory complications: Secondary | ICD-10-CM

## 2017-08-24 MED ORDER — SIMVASTATIN 40 MG PO TABS: 40.0000 mg | ORAL_TABLET | Freq: Every day | ORAL | 3 refills | Status: DC

## 2017-08-24 MED ORDER — EZETIMIBE 10 MG PO TABS: 10.0000 mg | ORAL_TABLET | Freq: Every day | ORAL | 0 refills | Status: DC

## 2017-08-24 NOTE — Patient Instructions (Signed)
Medication Instructions:  Your physician recommends that you continue on your current medications as directed. Please refer to the Current Medication list given to you today.  If you need a refill on your cardiac medications, please contact your pharmacy first.  Labwork: Your physician recommends that you return for lab work in: 1 week for liver function test and cholesterol check   Testing/Procedures: None ordered   Follow-Up: Your physician wants you to follow-up in: 1 year with Dr. Radford Pax. You will receive a reminder letter in the mail two months in advance. If you don't receive a letter, please call our office to schedule the follow-up appointment.  Any Other Special Instructions Will Be Listed Below (If Applicable).   Thank you for choosing [place], RN  [phone]  If you need a refill on your cardiac medications before your next appointment, please call your pharmacy.

## 2017-08-24 NOTE — Progress Notes (Addendum)
Cardiology Office Note:    Date:  08/24/2017   ID:  Carl Harrell, DOB [birth year], MRN [id number]  PCP:  Lucretia Kern, DO  Cardiologist:  No primary care provider on file.    Referring MD: Lucretia Kern, DO   Chief Complaint  Patient presents with  . Coronary Artery Disease  . Hypertension  . Hyperlipidemia    History of Present Illness:    Carl Harrell is a 72 y.o. male with a hx of TIA, HL, GERD, PAD, ASCAD with LHC 05/29/14 demonstratinga subtotally occluded native RCA with 99% proximal stenosis s/p DES. There was moderate stenosis noted in the LAD at 50-70%. There was normal anterior perfusion on nuclear stress test in 2016.  he is here today for followup and is doing well.  He denies any chest pain or pressure, SOB, DOE, PND, orthopnea, LE edema, dizziness, palpitations or syncope.  He has had some swelling in his left knee recently around the kneecap and slightly above the kneecap.  It is difficult for him to bend his knee.  I recommended he follow-up with his primary care doctor in regards to this.  He is compliant with his meds and is tolerating meds with no SE.      Past Medical History:  Diagnosis Date  . ALLERGIC RHINITIS 01/30/2007  . Arthritis    "hands" (05/29/2014)  . Coronary artery disease    LHC (05/29/14): pLAD 50-70, pRCA 99 (L>R collats), EF 55% >> PCI: 3.5 x 28 mm Xience Alpine DES to [employer]  . Cough secondary to angiotensin converting enzyme inhibitor (ACE-I) 05/28/2013  . ED (erectile dysfunction)   . GERD 09/26/2007  . HYPERLIPIDEMIA 09/26/2007  . Hypertension   . PLANTAR FASCIITIS, BILATERAL 07/08/2009  . Unspecified hearing loss 06/16/2009   wears aides both ears    Past Surgical History:  Procedure Laterality Date  . CARDIAC CATHETERIZATION  05/29/2014   Procedure: CORONARY STENT INTERVENTION;  Surgeon: Sinclair Grooms, MD;  Location: [facility] CATH LAB;  Service: Cardiovascular;;  Prox RCA  . CORONARY ANGIOPLASTY WITH STENT PLACEMENT  05/29/2014   "1"  . KNEE  ARTHROSCOPY Right ~ 2005  . LEFT HEART CATHETERIZATION WITH CORONARY ANGIOGRAM N/A 05/29/2014   Procedure: LEFT HEART CATHETERIZATION WITH CORONARY ANGIOGRAM;  Surgeon: Sinclair Grooms, MD;  Location: [facility] CATH LAB;  Service: Cardiovascular;  Laterality: N/A;    Current Medications: Current Meds  Medication Sig  . aspirin EC 81 MG tablet Take 81 mg by mouth daily.  . Cyanocobalamin (VITAMIN B-12 CR PO) Take by mouth daily.  Marland Kitchen ezetimibe (ZETIA) 10 MG tablet Take 1 tablet (10 mg total) by mouth daily. Please keep appointment for additional refills thanks.  Marland Kitchen losartan-hydrochlorothiazide (HYZAAR) 50-12.5 MG tablet Take 1 tablet by mouth daily.  . metoprolol tartrate (LOPRESSOR) 25 MG tablet TAKE 1 TABLET (25 MG TOTAL) BY MOUTH 2 (TWO) TIMES DAILY.  . Multiple Vitamin (MULTIVITAMIN WITH MINERALS) TABS tablet Take 1 tablet by mouth daily.  Marland Kitchen NITROSTAT 0.4 MG SL tablet PLACE 1 TABLET BY MOUTH UNDER TONGUE EVERY 5 MINUTES AS NEEDED FOR CHEST PAIN  . omeprazole (PRILOSEC) 40 MG capsule Take 40 mg by mouth daily.  . simvastatin (ZOCOR) 40 MG tablet Take 1 tablet (40 mg total) by mouth daily at 6 PM.  . tamsulosin (FLOMAX) 0.4 MG CAPS capsule Take 1 capsule (0.4 mg total) 2 (two) times daily by mouth.  . [DISCONTINUED] ezetimibe (ZETIA) 10 MG tablet Take 1 tablet (10  mg total) by mouth daily. Please keep appointment for additional refills thanks.  . [DISCONTINUED] simvastatin (ZOCOR) 40 MG tablet TAKE 1 TABLET (40 MG TOTAL) BY MOUTH AT BEDTIME.     Allergies:   Lisinopril   Social History   Socioeconomic History  . Marital status: Married    Spouse name: Not on file  . Number of children: Not on file  . Years of education: Not on file  . Highest education level: Not on file  Occupational History  . Not on file  Social Needs  . Financial resource strain: Not on file  . Food insecurity:    Worry: Not on file    Inability: Not on file  . Transportation needs:    Medical: Not on file     Non-medical: Not on file  Tobacco Use  . Smoking status: Former Smoker    Types: Cigarettes    Last attempt to quit: 05/24/1978    Years since quitting: 39.2  . Smokeless tobacco: Never Used  . Tobacco comment: 05/29/2014 "smoked socially; nothing since 1980"  Substance and Sexual Activity  . Alcohol use: Yes    Alcohol/week: 1.2 oz    Types: 2 Shots of liquor per week    Comment: beer or two a week  . Drug use: No  . Sexual activity: Not Currently  Lifestyle  . Physical activity:    Days per week: Not on file    Minutes per session: Not on file  . Stress: Not on file  Relationships  . Social connections:    Talks on phone: Not on file    Gets together: Not on file    Attends religious service: Not on file    Active member of club or organization: Not on file    Attends meetings of clubs or organizations: Not on file    Relationship status: Not on file  Other Topics Concern  . Not on file  Social History Narrative   Lives with wife in a one story home.  Has 2 children.  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  Education: 3 years of college.    Does wood working.      Family History: The patient's family history includes Heart attack in his father and mother; Heart disease in his father and mother. There is no history of COPD or Hyperlipidemia.  ROS:   Please see the history of present illness.    ROS  All other systems reviewed and negative.   EKGs/Labs/Other Studies Reviewed:    The following studies were reviewed today: none  EKG:  EKG is  ordered today.  The ekg ordered today demonstrates NSR at 80bpm and LAFB  Recent Labs: 02/20/2017: ALT 25 06/09/2017: BUN 16; Creatinine, Ser 1.16; Hemoglobin 15.1; Platelets 256.0; Potassium 5.0; Sodium 139   Recent Lipid Panel    Component Value Date/Time   CHOL 130 08/05/2016 0826   TRIG 121 08/05/2016 0826   HDL 38 (L) 08/05/2016 0826   CHOLHDL 3.4 08/05/2016 0826   CHOLHDL 3.3 09/22/2015 0835   VLDL 21 09/22/2015 0835   LDLCALC 68  08/05/2016 0826    Physical Exam:    VS:  BP 117/77   Pulse 80   Ht 5' 7.5" (1.715 m)   Wt 208 lb (94.3 kg)   BMI 32.10 kg/m     Wt Readings from Last 3 Encounters:  08/24/17 208 lb (94.3 kg)  06/09/17 209 lb (94.8 kg)  06/01/17 210 lb (95.3 kg)     GEN:  Well nourished, well developed in no acute distress HEENT: Normal NECK: No JVD; No carotid bruits LYMPHATICS: No lymphadenopathy CARDIAC: RRR, no murmurs, rubs, gallops RESPIRATORY:  Clear to auscultation without rales, wheezing or rhonchi  ABDOMEN: Soft, non-tender, non-distended MUSCULOSKELETAL:  No edema; No deformity  SKIN: Warm and dry NEUROLOGIC:  Alert and oriented x 3 PSYCHIATRIC:  Normal affect   ASSESSMENT:    1. Coronary artery disease involving native coronary artery of native heart without angina pectoris   2. Hypertension associated with diabetes ([place])   3. Hyperlipidemia with target LDL less than 70    PLAN:    In order of problems listed above:  1.  ASCAD - cath 05/29/14 demonstrateda subtotally occluded native RCA with 99% proximal stenosis s/p DES. There was moderate stenosis noted in the LAD at 50-70%. There was normal anterior perfusion on nuclear stress test in 2016.  He has not had any anginal symptoms since I saw him last.  He will continue on ASA 81 mg daily, beta-blocker and statin.  2.  HTN - blood pressure is well controlled on exam today and actually on the soft side.  He will continue on Hyzaar 50/12.5 mg daily, Lopressor 25 mg twice daily.  His creatinine was normal at 1.16 on 06/09/2017.  3.  Hyperlipidemia with LDL goal less than 70.  He will continue on simvastatin 40 mg daily and Zetia 10 mg daily.  I will check a fasting lipid panel and ALT. I will refill his Zetia and simvastatin today.   Medication Adjustments/Labs and Tests Ordered: Current medicines are reviewed at length with the patient today.  Concerns regarding medicines are outlined above.  Orders Placed This Encounter    Procedures  . Hepatic function panel  . Lipid panel  . EKG 12-Lead   Meds ordered this encounter  Medications  . ezetimibe (ZETIA) 10 MG tablet    Sig: Take 1 tablet (10 mg total) by mouth daily. Please keep appointment for additional refills thanks.    Dispense:  90 tablet    Refill:  0  . simvastatin (ZOCOR) 40 MG tablet    Sig: Take 1 tablet (40 mg total) by mouth daily at 6 PM.    Dispense:  90 tablet    Refill:  3    Signed, Fransico Him, MD  08/24/2017 11:48 AM    [place]

## 2017-08-25 ENCOUNTER — Other Ambulatory Visit: Payer: Medicare Other | Admitting: *Deleted

## 2017-08-25 ENCOUNTER — Encounter: Payer: Self-pay | Admitting: Family Medicine

## 2017-08-25 ENCOUNTER — Ambulatory Visit (INDEPENDENT_AMBULATORY_CARE_PROVIDER_SITE_OTHER): Payer: Medicare Other | Admitting: Family Medicine

## 2017-08-25 VITALS — BP 84/60 | HR 81 | Temp 98.1°F | Ht 67.5 in | Wt 210.7 lb

## 2017-08-25 DIAGNOSIS — E785 Hyperlipidemia, unspecified: Secondary | ICD-10-CM | POA: Diagnosis not present

## 2017-08-25 DIAGNOSIS — M25469 Effusion, unspecified knee: Secondary | ICD-10-CM | POA: Diagnosis not present

## 2017-08-25 LAB — LIPID PANEL
CHOLESTEROL TOTAL: 134 mg/dL (ref 100–199)
Chol/HDL Ratio: 3.3 ratio (ref 0.0–5.0)
HDL: 41 mg/dL (ref >39)
LDL CALC: 68 mg/dL (ref 0–99)
TRIGLYCERIDES: 127 mg/dL (ref 0–149)
VLDL Cholesterol Cal: 25 mg/dL (ref 5–40)

## 2017-08-25 LAB — HEPATIC FUNCTION PANEL
ALT: 29 IU/L (ref 0–44)
AST: 21 IU/L (ref 0–40)
Albumin: 4.3 g/dL (ref 3.5–4.8)
Alkaline Phosphatase: 59 IU/L (ref 39–117)
Bilirubin Total: 0.6 mg/dL (ref 0.0–1.2)
Bilirubin, Direct: 0.2 mg/dL (ref 0.00–0.40)
Total Protein: 6.7 g/dL (ref 6.0–8.5)

## 2017-08-25 NOTE — Progress Notes (Signed)
HPI:  Using dictation device. Unfortunately this device frequently misinterprets words/phrases.  Carl Harrell is a very pleasant 72 year old here for an acute visit regarding left knee swelling: -He noticed this after bowling with a brace on about 3 weeks ago -He has had a little stiffness in the knee, but not much pain -Reports a history of "bone on bone" issues with the opposite knee and a history of arthroscopy -Denies pain, weakness, locking, clicking or giving way  ROS: See pertinent positives and negatives per HPI.  Past Medical History:  Diagnosis Date  . ALLERGIC RHINITIS 01/30/2007  . Arthritis    "hands" (05/29/2014)  . Coronary artery disease    LHC (05/29/14): pLAD 50-70, pRCA 99 (L>R collats), EF 55% >> PCI: 3.5 x 28 mm Xience Alpine DES to [employer]  . Cough secondary to angiotensin converting enzyme inhibitor (ACE-I) 05/28/2013  . ED (erectile dysfunction)   . GERD 09/26/2007  . HYPERLIPIDEMIA 09/26/2007  . Hypertension   . PLANTAR FASCIITIS, BILATERAL 07/08/2009  . Unspecified hearing loss 06/16/2009   wears aides both ears    Past Surgical History:  Procedure Laterality Date  . CARDIAC CATHETERIZATION  05/29/2014   Procedure: CORONARY STENT INTERVENTION;  Surgeon: Sinclair Grooms, MD;  Location: [facility] CATH LAB;  Service: Cardiovascular;;  Prox RCA  . CORONARY ANGIOPLASTY WITH STENT PLACEMENT  05/29/2014   "1"  . KNEE ARTHROSCOPY Right ~ 2005  . LEFT HEART CATHETERIZATION WITH CORONARY ANGIOGRAM N/A 05/29/2014   Procedure: LEFT HEART CATHETERIZATION WITH CORONARY ANGIOGRAM;  Surgeon: Sinclair Grooms, MD;  Location: [facility] C[facility];  Service: Cardiovascular;  Laterality: N/A;    Family History  Problem Relation Age of Onset  . Heart attack Mother   . Heart disease Mother   . Heart attack Father   . Heart disease Father   . COPD Neg Hx        family hx  . Hyperlipidemia Neg Hx        family hx    SOCIAL HX: No reported changes   Current Outpatient Medications:  .   aspirin EC 81 MG tablet, Take 81 mg by mouth daily., Disp: , Rfl:  .  Cyanocobalamin (VITAMIN B-12 CR PO), Take by mouth daily., Disp: , Rfl:  .  ezetimibe (ZETIA) 10 MG tablet, Take 1 tablet (10 mg total) by mouth daily. Please keep appointment for additional refills thanks., Disp: 90 tablet, Rfl: 0 .  losartan-hydrochlorothiazide (HYZAAR) 50-12.5 MG tablet, Take 1 tablet by mouth daily., Disp: , Rfl:  .  metoprolol tartrate (LOPRESSOR) 25 MG tablet, TAKE 1 TABLET (25 MG TOTAL) BY MOUTH 2 (TWO) TIMES DAILY., Disp: 180 tablet, Rfl: 1 .  Multiple Vitamin (MULTIVITAMIN WITH MINERALS) TABS tablet, Take 1 tablet by mouth daily., Disp: , Rfl:  .  NITROSTAT 0.4 MG SL tablet, PLACE 1 TABLET BY MOUTH UNDER TONGUE EVERY 5 MINUTES AS NEEDED FOR CHEST PAIN, Disp: 25 tablet, Rfl: 12 .  omeprazole (PRILOSEC) 40 MG capsule, Take 40 mg by mouth daily., Disp: , Rfl:  .  simvastatin (ZOCOR) 40 MG tablet, Take 1 tablet (40 mg total) by mouth daily at 6 PM., Disp: 90 tablet, Rfl: 3 .  tamsulosin (FLOMAX) 0.4 MG CAPS capsule, Take 1 capsule (0.4 mg total) 2 (two) times daily by mouth., Disp: 60 capsule, Rfl: 5  EXAM:  Vitals:   08/25/17 1552  BP: (!) 84/60  Pulse: 81  Temp: 98.1 F (36.7 C)    Body mass index  is 32.51 kg/m.  GENERAL: vitals reviewed and listed above, alert, oriented, appears well hydrated and in no acute distress  HEENT: atraumatic, conjunttiva clear, no obvious abnormalities on inspection of external nose and ears  NECK: no obvious masses on inspection  MS: moves all extremities without noticeable abnormality, on inspection the he has perhaps minimal swelling around the kneecap of the left knee, no redness or warmth is appreciated on exam today, no significant tenderness to palpation, positive patellar crepitus, negative anterior posterior drawer testing, negative Lachman, negative McMurray  PSYCH: pleasant and cooperative, no obvious depression or anxiety  ASSESSMENT AND  PLAN:  Discussed the following assessment and plan:  Knee swelling  -Discussed potential etiologies with OA of the knee most likely, likely infrapatellar and knee joint OA -Given he has little pain and not much discomfort, opted for observation, ice and elevation advised continuation of gentle activities/exercise advised to return if symptoms worsen or persist or new concerns arise.  There are no Patient Instructions on file for this visit.  Lucretia Kern, DO

## 2017-10-14 ENCOUNTER — Other Ambulatory Visit: Payer: Self-pay | Admitting: Cardiology

## 2017-10-14 ENCOUNTER — Other Ambulatory Visit: Payer: Self-pay | Admitting: *Deleted

## 2017-10-14 ENCOUNTER — Other Ambulatory Visit: Payer: Self-pay | Admitting: Family Medicine

## 2017-10-14 DIAGNOSIS — I10 Essential (primary) hypertension: Secondary | ICD-10-CM

## 2017-10-14 MED ORDER — SIMVASTATIN 40 MG PO TABS: 40.0000 mg | ORAL_TABLET | Freq: Every day | ORAL | 2 refills | Status: DC

## 2017-10-14 MED ORDER — METOPROLOL TARTRATE 25 MG PO TABS: ORAL_TABLET | ORAL | 0 refills | Status: DC

## 2017-10-14 NOTE — Telephone Encounter (Signed)
Copied from [place] [phone]. Topic: Quick Communication - Rx Refill/Question >> Oct 14, 2017  9:21 AM Arletha Grippe wrote: Medication: metoprolol tartrate (LOPRESSOR) 25 MG tablet, simvastatin (ZOCOR) 40 MG tablet, losartan-hydrochlorothiazide (HYZAAR) 50-12.5 MG tablet  Has the patient contacted their pharmacy? Yes.   (Agent: If no, request that the patient contact the pharmacy for the refill.) (Agent: If yes, when and what did the pharmacy advise?)  Preferred Pharmacy (with phone number or street name): [facility] [address]  - new pharm   Agent: Please be advised that RX refills may take up to 3 business days. We ask that you follow-up with your pharmacy.

## 2017-10-14 NOTE — Telephone Encounter (Signed)
Remaining refills for Rx for Zocor and lopressor forwarded to new pharmacy per patient request.  Request for Hyzaar 50-12.5 mg  Last filled: 08/24/17  Historical provider  LOV: 06/09/17  PCP: [place]: verified

## 2017-10-17 MED ORDER — LOSARTAN POTASSIUM-HCTZ 50-12.5 MG PO TABS: 1.0000 | ORAL_TABLET | Freq: Every day | ORAL | 3 refills | Status: DC

## 2018-01-16 ENCOUNTER — Other Ambulatory Visit: Payer: Self-pay | Admitting: Family Medicine

## 2018-01-25 ENCOUNTER — Telehealth: Payer: Self-pay | Admitting: Radiation Oncology

## 2018-01-25 NOTE — Progress Notes (Signed)
Received fax refill request from [facility] for patient's Flomax. Patient release back to [facility] in January 2019. Refill marked not authorized and reason why. Faxed form back to [facility] at [phone]. Fax confirmation of delivery obtained.

## 2018-02-06 DIAGNOSIS — R3915 Urgency of urination: Secondary | ICD-10-CM | POA: Diagnosis not present

## 2018-02-17 ENCOUNTER — Ambulatory Visit: Payer: Medicare Other

## 2018-02-21 ENCOUNTER — Other Ambulatory Visit: Payer: Self-pay | Admitting: Family Medicine

## 2018-02-27 ENCOUNTER — Ambulatory Visit: Payer: Medicare Other

## 2018-02-27 NOTE — Progress Notes (Deleted)
Subjective:   Carl Harrell is a 71 y.o. male who presents for Medicare Annual/Subsequent preventive examination.  Reports health as Last OV 08/25/2017  Cardiology 08/24/2017 Dr. Maudie Mercury in January for diabetes  Seeing [facility] for Oncology management  Diet New DM in 2018 and he prefers to treat via lifestyle changes A1c 3.3  A1c 6.7 FBS 109   Exercise  Health Maintenance Due  Topic Date Due  . FOOT EXAM  06/30/1955  . OPHTHALMOLOGY EXAM  06/30/1955  . HEMOGLOBIN A1C  12/07/2017  . INFLUENZA VACCINE  12/22/2017     colonoscopy 09/2011 - and 2015; due again 05/25/2023   AAA screen 09/2016   Discuss shingrix      Objective:    Vitals: There were no vitals taken for this visit.  There is no height or weight on file to calculate BMI.  Advanced Directives 06/01/2017 02/20/2017 02/20/2017 02/16/2017 02/15/2017 08/24/2016 04/23/2016  Does Patient Have a Medical Advance Directive? No No No No No No No  Does patient want to make changes to medical advance directive? - - - - - - -  Copy of [place] in Chart? - - - - - - -  Would patient like information on creating a medical advance directive? - No - Patient declined - - - - -    Tobacco Social History   Tobacco Use  Smoking Status Former Smoker  . Types: Cigarettes  . Last attempt to quit: 05/24/1978  . Years since quitting: 39.7  Smokeless Tobacco Never Used  Tobacco Comment   05/29/2014 "smoked socially; nothing since 1980"     Counseling given: Not Answered Comment: 05/29/2014 "smoked socially; nothing since 1980"   Clinical Intake:     Past Medical History:  Diagnosis Date  . ALLERGIC RHINITIS 01/30/2007  . Arthritis    "hands" (05/29/2014)  . Coronary artery disease    LHC (05/29/14): pLAD 50-70, pRCA 99 (L>R collats), EF 55% >> PCI: 3.5 x 28 mm Xience Alpine DES to [employer]  . Cough secondary to angiotensin converting enzyme inhibitor (ACE-I) 05/28/2013  . ED (erectile dysfunction)   . GERD 09/26/2007  .  HYPERLIPIDEMIA 09/26/2007  . Hypertension   . PLANTAR FASCIITIS, BILATERAL 07/08/2009  . Unspecified hearing loss 06/16/2009   wears aides both ears   Past Surgical History:  Procedure Laterality Date  . CARDIAC CATHETERIZATION  05/29/2014   Procedure: CORONARY STENT INTERVENTION;  Surgeon: Sinclair Grooms, MD;  Location: [facility] CATH LAB;  Service: Cardiovascular;;  Prox RCA  . CORONARY ANGIOPLASTY WITH STENT PLACEMENT  05/29/2014   "1"  . KNEE ARTHROSCOPY Right ~ 2005  . LEFT HEART CATHETERIZATION WITH CORONARY ANGIOGRAM N/A 05/29/2014   Procedure: LEFT HEART CATHETERIZATION WITH CORONARY ANGIOGRAM;  Surgeon: Sinclair Grooms, MD;  Location: [facility] CATH LAB;  Service: Cardiovascular;  Laterality: N/A;   Family History  Problem Relation Age of Onset  . Heart attack Mother   . Heart disease Mother   . Heart attack Father   . Heart disease Father   . COPD Neg Hx        family hx  . Hyperlipidemia Neg Hx        family hx   Social History   Socioeconomic History  . Marital status: Married    Spouse name: Not on file  . Number of children: Not on file  . Years of education: Not on file  . Highest education level: Not on file  Occupational History  .  Not on file  Social Needs  . Financial resource strain: Not on file  . Food insecurity:    Worry: Not on file    Inability: Not on file  . Transportation needs:    Medical: Not on file    Non-medical: Not on file  Tobacco Use  . Smoking status: Former Smoker    Types: Cigarettes    Last attempt to quit: 05/24/1978    Years since quitting: 39.7  . Smokeless tobacco: Never Used  . Tobacco comment: 05/29/2014 "smoked socially; nothing since 1980"  Substance and Sexual Activity  . Alcohol use: Yes    Alcohol/week: 2.0 standard drinks    Types: 2 Shots of liquor per week    Comment: beer or two a week  . Drug use: No  . Sexual activity: Not Currently  Lifestyle  . Physical activity:    Days per week: Not on file    Minutes per session:  Not on file  . Stress: Not on file  Relationships  . Social connections:    Talks on phone: Not on file    Gets together: Not on file    Attends religious service: Not on file    Active member of club or organization: Not on file    Attends meetings of clubs or organizations: Not on file    Relationship status: Not on file  Other Topics Concern  . Not on file  Social History Narrative   Lives with wife in a one story home.  Has 2 children.  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  Education: 3 years of college.    Does wood working.     Outpatient Encounter Medications as of 02/27/2018  Medication Sig  . aspirin EC 81 MG tablet Take 81 mg by mouth daily.  . Cyanocobalamin (VITAMIN B-12 CR PO) Take by mouth daily.  Marland Kitchen ezetimibe (ZETIA) 10 MG tablet TAKE 1 TABLET BY MOUTH EVERY DAY  . losartan-hydrochlorothiazide (HYZAAR) 50-12.5 MG tablet Take 1 tablet by mouth daily.  . metoprolol tartrate (LOPRESSOR) 25 MG tablet TAKE 1 TABLET BY MOUTH TWICE DAILY  . metoprolol tartrate (LOPRESSOR) 25 MG tablet TAKE 1 TABLET BY MOUTH TWICE DAILY  . Multiple Vitamin (MULTIVITAMIN WITH MINERALS) TABS tablet Take 1 tablet by mouth daily.  Marland Kitchen NITROSTAT 0.4 MG SL tablet PLACE 1 TABLET BY MOUTH UNDER TONGUE EVERY 5 MINUTES AS NEEDED FOR CHEST PAIN  . omeprazole (PRILOSEC) 40 MG capsule Take 40 mg by mouth daily.  . simvastatin (ZOCOR) 40 MG tablet Take 1 tablet (40 mg total) by mouth daily at 6 PM.  . tamsulosin (FLOMAX) 0.4 MG CAPS capsule Take 1 capsule (0.4 mg total) 2 (two) times daily by mouth.   No facility-administered encounter medications on file as of 02/27/2018.     Activities of Daily Living No flowsheet data found.  Patient Care Team: Lucretia Kern, DO as PCP - General (Family Medicine) Sueanne Margarita, MD as Consulting Physician (Cardiology)   Assessment:   This is a routine wellness examination for [facility].  Exercise Activities and Dietary recommendations    Goals    . Exercise 150 minutes per week  (moderate activity)     [facility]  [facility]; lives in [place];  Walk in the am; walk one mile - 5 days  [place] is one mile around     . patient      Raise bowling average -188  Goal is 190      .  Weight (lb) < 180 lb (81.6 kg)     Eating consistently  Using thyme or fennel when cooking green beans Use the white bulb of the fennel stem   Fat free or low fat dairy products Fish high in omega-3 acids ( salmon, tuna, trout) Fruits, such as apples, bananas, oranges, pears, prunes Legumes, such as kidney beans, lentils, checkpeas, black-eyed peas and lima beans Vegetables; broccoli, cabbage, carrots Whole grains;   Plant fats are better; decrease "white" foods as pasta, rice, bread and desserts, sugar; Avoid red meat (limiting) palm and coconut oils; sugary foods and beverages  Two nutrients that raise blood chol levels are saturated fats and trans fat; in hydrogenated oils and fats, as stick margarine, baked goods (cookes, cakes, pies, crackers; frosting; and coffee creamers;   Some Fats lower cholesterol: Monounsaturated and polyunsaturated  Avocados Corn, sunflower, and soybean oils Nuts and seeds, such as walnuts Olive, canola, peanut, safflower, and sesame oils Peanut butter Salmon and trout Tofu          Fall Risk Fall Risk  06/01/2017 02/16/2017 02/15/2017 08/24/2016 04/23/2016  Falls in the past year? No No No No Yes  Number falls in past yr: - - - - 1  Follow up - - - - Education provided    Depression Screen PHQ 2/9 Scores 06/01/2017 02/15/2017 08/24/2016 04/23/2016  PHQ - 2 Score 0 0 0 0    Cognitive Function MMSE - Mini Mental State Exam 08/24/2016  Not completed: (No Data)     6CIT Screen 04/23/2016  What Year? 0 points  What month? 0 points  What time? 0 points  Count back from 20 0 points  Months in reverse 0 points  Repeat phrase 0 points  Total Score 0    Immunization History  Administered Date(s) Administered  . Influenza  Split 03/03/2011, 02/01/2012, 04/27/2013  . Influenza Whole 05/24/2004  . Influenza, High Dose Seasonal PF 01/16/2016  . Influenza,inj,Quad PF,6+ Mos 02/22/2013, 03/13/2014  . Influenza-Unspecified 03/07/2015, 02/19/2017  . Pneumococcal Conjugate-13 03/21/2014  . Pneumococcal Polysaccharide-23 10/03/2008  . Td 05/24/2002  . Tetanus 03/21/2014      Screening Tests Health Maintenance  Topic Date Due  . FOOT EXAM  06/30/1955  . OPHTHALMOLOGY EXAM  06/30/1955  . HEMOGLOBIN A1C  12/07/2017  . INFLUENZA VACCINE  12/22/2017  . COLONOSCOPY  05/25/2023  . TETANUS/TDAP  03/21/2024  . Hepatitis C Screening  Completed  . PNA vac Low Risk Adult  Completed         Plan:       PCP Notes ***  Health Maintenance ***  Abnormal Screens  ***  Referrals  ***  Patient concerns; ***  Nurse Concerns; ***  Next PCP apt ***     I have personally reviewed and noted the following in the patient's chart:   . Medical and social history . Use of alcohol, tobacco or illicit drugs  . Current medications and supplements . Functional ability and status . Nutritional status . Physical activity . Advanced directives . List of other physicians . Hospitalizations, surgeries, and ER visits in previous 12 months . Vitals . Screenings to include cognitive, depression, and falls . Referrals and appointments  In addition, I have reviewed and discussed with patient certain preventive protocols, quality metrics, and best practice recommendations. A written personalized care plan for preventive services as well as general preventive health recommendations were provided to patient.     CLEXN,TZGYF, RN  02/27/2018

## 2018-03-06 ENCOUNTER — Ambulatory Visit (INDEPENDENT_AMBULATORY_CARE_PROVIDER_SITE_OTHER): Payer: Medicare Other

## 2018-03-06 VITALS — BP 110/70 | HR 87 | Ht 67.5 in | Wt 208.0 lb

## 2018-03-06 DIAGNOSIS — Z0000 Encounter for general adult medical examination without abnormal findings: Secondary | ICD-10-CM | POA: Diagnosis not present

## 2018-03-06 NOTE — Patient Instructions (Addendum)
Carl Harrell , Thank you for taking time to come for your Medicare Wellness Visit. I appreciate your ongoing commitment to your health goals. Please review the following plan we discussed and let me know if I can assist you in the future.   Will make an apt with Dr. Maudie Mercury when you leave so she can fup on your Blood sugar   Bring your  Advanced Directive in when complete   Veterans want to access their VA benefits can call [facility]  [address] [place], [place] [address]  Phone: [phone] Or [phone] Fax: [phone]  OR   They can to to the [facility] office and call [phone] and If the prompt starts, hit 0 and then ask for eligibility. They will assist you to sign up for medical benefits.     Will schedule an eye exam  Call [facility] and check on payment for your eye exam   Shingrix is a vaccine for the prevention of Shingles in Adults 50 and older.  If you are on Medicare, the shingrix is covered under your Part D plan, so you will take both of the vaccines in the series at your pharmacy. Please check with your benefits regarding applicable copays or out of pocket expenses.  The Shingrix is given in 2 vaccines approx 8 weeks apart. You must receive the 2nd dose prior to 6 months from receipt of the first. Please have the pharmacist print out you Immunization  dates for our office records      These are the goals we discussed: Goals    . Exercise 150 minutes per week (moderate activity)     [facility]; lives in [place];  Walk in the am; walk one mile - 5 days  [place] [facility] [place] one mile around     . patient      Raise bowling average -188  Goal is 190      . Weight (lb) < 180 lb (81.6 kg)     Eating consistently  Using thyme or fennel when cooking green beans Use the white bulb of the fennel stem   Fat free or low fat dairy products Fish high in omega-3 acids ( salmon, tuna,  trout) Fruits, such as apples, bananas, oranges, pears, prunes Legumes, such as kidney beans, lentils, checkpeas, black-eyed peas and lima beans Vegetables; broccoli, cabbage, carrots Whole grains;   Plant fats are better; decrease "white" foods as pasta, rice, bread and desserts, sugar; Avoid red meat (limiting) palm and coconut oils; sugary foods and beverages  Two nutrients that raise blood chol levels are saturated fats and trans fat; in hydrogenated oils and fats, as stick margarine, baked goods (cookes, cakes, pies, crackers; frosting; and coffee creamers;   Some Fats lower cholesterol: Monounsaturated and polyunsaturated  Avocados Corn, sunflower, and soybean oils Nuts and seeds, such as walnuts Olive, canola, peanut, safflower, and sesame oils Peanut butter Salmon and trout Tofu          This is a list of the screening recommended for you and due dates:  Health Maintenance  Topic Date Due  . Complete foot exam   06/30/1955  . Eye exam for diabetics  06/30/1955  . Hemoglobin A1C  12/07/2017  . Flu Shot  12/22/2017  . Colon Cancer Screening  05/25/2023  . Tetanus Vaccine  03/21/2024  .  Hepatitis C: One time screening is recommended by Center for Disease Control  (  CDC) for  adults born from [birth year] through 1965.   Completed  . Pneumonia vaccines  Completed     Fall Prevention in the Home Falls can cause injuries. They can happen to people of all ages. There are many things you can do to make your home safe and to help prevent falls. What can I do on the outside of my home?  Regularly fix the edges of walkways and driveways and fix any cracks.  Remove anything that might make you trip as you walk through a door, such as a raised step or threshold.  Trim any bushes or trees on the path to your home.  Use bright outdoor lighting.  Clear any walking paths of anything that might make someone trip, such as rocks or tools.  Regularly check to see if handrails are loose or  broken. Make sure that both sides of any steps have handrails.  Any raised decks and porches should have guardrails on the edges.  Have any leaves, snow, or ice cleared regularly.  Use sand or salt on walking paths during winter.  Clean up any spills in your garage right away. This includes oil or grease spills. What can I do in the bathroom?  Use night lights.  Install grab bars by the toilet and in the tub and shower. Do not use towel bars as grab bars.  Use non-skid mats or decals in the tub or shower.  If you need to sit down in the shower, use a plastic, non-slip stool.  Keep the floor dry. Clean up any water that spills on the floor as soon as it happens.  Remove soap buildup in the tub or shower regularly.  Attach bath mats securely with double-sided non-slip rug tape.  Do not have throw rugs and other things on the floor that can make you trip. What can I do in the bedroom?  Use night lights.  Make sure that you have a light by your bed that is easy to reach.  Do not use any sheets or blankets that are too big for your bed. They should not hang down onto the floor.  Have a firm chair that has side arms. You can use this for support while you get dressed.  Do not have throw rugs and other things on the floor that can make you trip. What can I do in the kitchen?  Clean up any spills right away.  Avoid walking on wet floors.  Keep items that you use a lot in easy-to-reach places.  If you need to reach something above you, use a strong step stool that has a grab bar.  Keep electrical cords out of the way.  Do not use floor polish or wax that makes floors slippery. If you must use wax, use non-skid floor wax.  Do not have throw rugs and other things on the floor that can make you trip. What can I do with my stairs?  Do not leave any items on the stairs.  Make sure that there are handrails on both sides of the stairs and use them. Fix handrails that are  broken or loose. Make sure that handrails are as long as the stairways.  Check any carpeting to make sure that it is firmly attached to the stairs. Fix any carpet that is loose or worn.  Avoid having throw rugs at the top or bottom of the stairs. If you do have throw rugs, attach them to the floor with carpet tape.  Make sure that you have a light switch at the top of the stairs and the bottom of the stairs. If you do not have them, ask someone to add them for you. What else can I do to help prevent falls?  Wear shoes that: ? Do not have high heels. ? Have rubber bottoms. ? Are comfortable and fit you well. ? Are closed at the toe. Do not wear sandals.  If you use a stepladder: ? Make sure that it is fully opened. Do not climb a closed stepladder. ? Make sure that both sides of the stepladder are locked into place. ? Ask someone to hold it for you, if possible.  Clearly mark and make sure that you can see: ? Any grab bars or handrails. ? First and last steps. ? Where the edge of each step is.  Use tools that help you move around (mobility aids) if they are needed. These include: ? Canes. ? Walkers. ? Scooters. ? Crutches.  Turn on the lights when you go into a dark area. Replace any light bulbs as soon as they burn out.  Set up your furniture so you have a clear path. Avoid moving your furniture around.  If any of your floors are uneven, fix them.  If there are any pets around you, be aware of where they are.  Review your medicines with your doctor. Some medicines can make you feel dizzy. This can increase your chance of falling. Ask your doctor what other things that you can do to help prevent falls. This information is not intended to replace advice given to you by your health care provider. Make sure you discuss any questions you have with your health care provider. Document Released: 03/06/2009 Document Revised: 10/16/2015 Document Reviewed: 06/14/2014 Elsevier  Interactive Patient Education  2018 [place] Maintenance, Male A healthy lifestyle and preventive care is important for your health and wellness. Ask your health care provider about what schedule of regular examinations is right for you. What should I know about weight and diet? Eat a Healthy Diet  Eat plenty of vegetables, fruits, whole grains, low-fat dairy products, and lean protein.  Do not eat a lot of foods high in solid fats, added sugars, or salt.  Maintain a Healthy Weight Regular exercise can help you achieve or maintain a healthy weight. You should:  Do at least 150 minutes of exercise each week. The exercise should increase your heart rate and make you sweat (moderate-intensity exercise).  Do strength-training exercises at least twice a week.  Watch Your Levels of Cholesterol and Blood Lipids  Have your blood tested for lipids and cholesterol every 5 years starting at 74 years of age. If you are at high risk for heart disease, you should start having your blood tested when you are 74 years old. You may need to have your cholesterol levels checked more often if: ? Your lipid or cholesterol levels are high. ? You are older than 74 years of age. ? You are at high risk for heart disease.  What should I know about cancer screening? Many types of cancers can be detected early and may often be prevented. Lung Cancer  You should be screened every year for lung cancer if: ? You are a current smoker who has smoked for at least 30 years. ? You are a former smoker who has quit within the past 15 years.  Talk to your health care provider about your screening options, when you  should start screening, and how often you should be screened.  Colorectal Cancer  Routine colorectal cancer screening usually begins at 74 years of age and should be repeated every 5-10 years until you are 74 years old. You may need to be screened more often if early forms of precancerous  polyps or small growths are found. Your health care provider may recommend screening at an earlier age if you have risk factors for colon cancer.  Your health care provider may recommend using home test kits to check for hidden blood in the stool.  A small camera at the end of a tube can be used to examine your colon (sigmoidoscopy or colonoscopy). This checks for the earliest forms of colorectal cancer.  Prostate and Testicular Cancer  Depending on your age and overall health, your health care provider may do certain tests to screen for prostate and testicular cancer.  Talk to your health care provider about any symptoms or concerns you have about testicular or prostate cancer.  Skin Cancer  Check your skin from head to toe regularly.  Tell your health care provider about any new moles or changes in moles, especially if: ? There is a change in a mole's size, shape, or color. ? You have a mole that is larger than a pencil eraser.  Always use sunscreen. Apply sunscreen liberally and repeat throughout the day.  Protect yourself by wearing long sleeves, pants, a wide-brimmed hat, and sunglasses when outside.  What should I know about heart disease, diabetes, and high blood pressure?  If you are 44-43 years of age, have your blood pressure checked every 3-5 years. If you are 20 years of age or older, have your blood pressure checked every year. You should have your blood pressure measured twice-once when you are at a hospital or clinic, and once when you are not at a hospital or clinic. Record the average of the two measurements. To check your blood pressure when you are not at a hospital or clinic, you can use: ? An automated blood pressure machine at a pharmacy. ? A home blood pressure monitor.  Talk to your health care provider about your target blood pressure.  If you are between 95-57 years old, ask your health care provider if you should take aspirin to prevent heart  disease.  Have regular diabetes screenings by checking your fasting blood sugar level. ? If you are at a normal weight and have a low risk for diabetes, have this test once every three years after the age of 45. ? If you are overweight and have a high risk for diabetes, consider being tested at a younger age or more often.  A one-time screening for abdominal aortic aneurysm (AAA) by ultrasound is recommended for men aged 32-75 years who are current or former smokers. What should I know about preventing infection? Hepatitis B If you have a higher risk for hepatitis B, you should be screened for this virus. Talk with your health care provider to find out if you are at risk for hepatitis B infection. Hepatitis C Blood testing is recommended for:  Everyone born from [birth year] through 1967.  Anyone with known risk factors for hepatitis C.  Sexually Transmitted Diseases (STDs)  You should be screened each year for STDs including gonorrhea and chlamydia if: ? You are sexually active and are younger than 73 years of age. ? You are older than 75 years of age and your health care provider tells you that you are at  risk for this type of infection. ? Your sexual activity has changed since you were last screened and you are at an increased risk for chlamydia or gonorrhea. Ask your health care provider if you are at risk.  Talk with your health care provider about whether you are at high risk of being infected with HIV. Your health care provider may recommend a prescription medicine to help prevent HIV infection.  What else can I do?  Schedule regular health, dental, and eye exams.  Stay current with your vaccines (immunizations).  Do not use any tobacco products, such as cigarettes, chewing tobacco, and e-cigarettes. If you need help quitting, ask your health care provider.  Limit alcohol intake to no more than 2 drinks per day. One drink equals 12 ounces of beer, 5 ounces of wine, or 1 ounces of  hard liquor.  Do not use street drugs.  Do not share needles.  Ask your health care provider for help if you need support or information about quitting drugs.  Tell your health care provider if you often feel depressed.  Tell your health care provider if you have ever been abused or do not feel safe at home. This information is not intended to replace advice given to you by your health care provider. Make sure you discuss any questions you have with your health care provider. Document Released: 11/06/2007 Document Revised: 01/07/2016 Document Reviewed: 02/11/2015 Elsevier Interactive Patient Education  2018 [employer].   Hearing Loss Hearing loss is a partial or total loss of the ability to hear. This can be temporary or permanent, and it can happen in one or both ears. Hearing loss may be referred to as deafness. Medical care is necessary to treat hearing loss properly and to prevent the condition from getting worse. Your hearing may partially or completely come back, depending on what caused your hearing loss and how severe it is. In some cases, hearing loss is permanent. What are the causes? Common causes of hearing loss include:  Too much wax in the ear canal.  Infection of the ear canal or middle ear.  Fluid in the middle ear.  Injury to the ear or surrounding area.  An object stuck in the ear.  Prolonged exposure to loud sounds, such as music.  Less common causes of hearing loss include:  Tumors in the ear.  Viral or bacterial infections, such as meningitis.  A hole in the eardrum (perforated eardrum).  Problems with the hearing nerve that sends signals between the brain and the ear.  Certain medicines.  What are the signs or symptoms? Symptoms of this condition may include:  Difficulty telling the difference between sounds.  Difficulty following a conversation when there is background noise.  Lack of response to sounds in your environment. This may be  most noticeable when you do not respond to startling sounds.  Needing to turn up the volume on the television, radio, etc.  Ringing in the ears.  Dizziness.  Pain in the ears.  How is this diagnosed? This condition is diagnosed based on a physical exam and a hearing test (audiometry). The audiometry test will be performed by a hearing specialist (audiologist). You may also be referred to an ear, nose, and throat (ENT) specialist (otolaryngologist). How is this treated? Treatment for recent onset of hearing loss may include:  Ear wax removal.  Being prescribed medicines to prevent infection (antibiotics).  Being prescribed medicines to reduce inflammation (corticosteroids).  Follow these instructions at home:  If  you were prescribed an antibiotic medicine, take it as told by your health care provider. Do not stop taking the antibiotic even if you start to feel better.  Take over-the-counter and prescription medicines only as told by your health care provider.  Avoid loud noises.  Return to your normal activities as told by your health care provider. Ask your health care provider what activities are safe for you.  Keep all follow-up visits as told by your health care provider. This is important. Contact a health care provider if:  You feel dizzy.  You develop new symptoms.  You vomit or feel nauseous.  You have a fever. Get help right away if:  You develop sudden changes in your vision.  You have severe ear pain.  You have new or increased weakness.  You have a severe headache. This information is not intended to replace advice given to you by your health care provider. Make sure you discuss any questions you have with your health care provider. Document Released: 05/10/2005 Document Revised: 10/16/2015 Document Reviewed: 09/25/2014 Elsevier Interactive Patient Education  2018 [employer].

## 2018-03-06 NOTE — Progress Notes (Addendum)
Subjective:   Carl Harrell is a 72 y.o. male who presents for Medicare Annual/Subsequent preventive examination.  Reports health as good Cancer free  BMI 32   Trying to get work; "started feeling worthless"  Just want to be productive Takes the dog to the dog park  Was a [place] veteran 61 to 55;  Children are 42 and 45    Diet:  A1c 6.7 don't eat much during the day He does snore; states he sleeps at hs but can sleep easily during the day.   States he sleeps 4 to 5 hours good  Eats lots of pleasure food; bread  Does eat meats; vegetables and fruits   [facility]; works out for one hour Bowls 3 times a week - still bowling  [facility] not [facility] due to taking the dog and to many trips to town    [employer] Due  Topic Date Due  . FOOT EXAM  06/30/1955  . OPHTHALMOLOGY EXAM  06/30/1955  . HEMOGLOBIN A1C  12/07/2017  . INFLUENZA VACCINE  12/22/2017   Takes flu vaccine from guy he bowls with   Educate regarding shingrix   Will schedule his eye exam or encouraged him to do so  Colonoscopy 05/2013 and repeat 05/2023   Smoking hx; VAS u/s AAA 09/27/2016   PSA -10/2016 Some bladder issues but controlled   Will make apt with Dr. Maudie Mercury     Objective:    Vitals: BP 110/70   Pulse 87   Ht 5' 7.5" (1.715 m)   Wt 208 lb (94.3 kg)   SpO2 95%   BMI 32.10 kg/m   Body mass index is 32.1 kg/m.  Advanced Directives 03/06/2018 06/01/2017 02/20/2017 02/20/2017 02/16/2017 02/15/2017 08/24/2016  Does Patient Have a Medical Advance Directive? No No No No No No No  Does patient want to make changes to medical advance directive? - - - - - - -  Copy of [place] in Chart? - - - - - - -  Would patient like information on creating a medical advance directive? - - No - Patient declined - - - -   Completed but has not brought it in   Tobacco Social History   Tobacco Use  Smoking Status Former Smoker  . Types: Cigarettes  .  Last attempt to quit: 05/24/1978  . Years since quitting: 39.8  Smokeless Tobacco Never Used  Tobacco Comment   05/29/2014 "smoked socially; nothing since 1980"     Counseling given: Yes Comment: 05/29/2014 "smoked socially; nothing since 1980"   Clinical Intake:     Past Medical History:  Diagnosis Date  . ALLERGIC RHINITIS 01/30/2007  . Arthritis    "hands" (05/29/2014)  . Coronary artery disease    LHC (05/29/14): pLAD 50-70, pRCA 99 (L>R collats), EF 55% >> PCI: 3.5 x 28 mm Xience Alpine DES to [employer]  . Cough secondary to angiotensin converting enzyme inhibitor (ACE-I) 05/28/2013  . ED (erectile dysfunction)   . GERD 09/26/2007  . HYPERLIPIDEMIA 09/26/2007  . Hypertension   . PLANTAR FASCIITIS, BILATERAL 07/08/2009  . Unspecified hearing loss 06/16/2009   wears aides both ears   Past Surgical History:  Procedure Laterality Date  . CARDIAC CATHETERIZATION  05/29/2014   Procedure: CORONARY STENT INTERVENTION;  Surgeon: Sinclair Grooms, MD;  Location: [facility] CATH LAB;  Service: Cardiovascular;;  Prox RCA  . CORONARY ANGIOPLASTY WITH STENT PLACEMENT  05/29/2014   "  1"  . KNEE ARTHROSCOPY Right ~ 2005  . LEFT HEART CATHETERIZATION WITH CORONARY ANGIOGRAM N/A 05/29/2014   Procedure: LEFT HEART CATHETERIZATION WITH CORONARY ANGIOGRAM;  Surgeon: Sinclair Grooms, MD;  Location: [facility] CATH LAB;  Service: Cardiovascular;  Laterality: N/A;   Family History  Problem Relation Age of Onset  . Heart attack Mother   . Heart disease Mother   . Heart attack Father   . Heart disease Father   . COPD Neg Hx        family hx  . Hyperlipidemia Neg Hx        family hx   Social History   Socioeconomic History  . Marital status: Married    Spouse name: Not on file  . Number of children: Not on file  . Years of education: Not on file  . Highest education level: Not on file  Occupational History  . Not on file  Social Needs  . Financial resource strain: Not on file  . Food insecurity:    Worry: Not on file     Inability: Not on file  . Transportation needs:    Medical: Not on file    Non-medical: Not on file  Tobacco Use  . Smoking status: Former Smoker    Types: Cigarettes    Last attempt to quit: 05/24/1978    Years since quitting: 39.8  . Smokeless tobacco: Never Used  . Tobacco comment: 05/29/2014 "smoked socially; nothing since 1980"  Substance and Sexual Activity  . Alcohol use: Yes    Alcohol/week: 2.0 standard drinks    Types: 2 Shots of liquor per week    Comment: beer or two a week  . Drug use: No  . Sexual activity: Not Currently  Lifestyle  . Physical activity:    Days per week: Not on file    Minutes per session: Not on file  . Stress: Not on file  Relationships  . Social connections:    Talks on phone: Not on file    Gets together: Not on file    Attends religious service: Not on file    Active member of club or organization: Not on file    Attends meetings of clubs or organizations: Not on file    Relationship status: Not on file  Other Topics Concern  . Not on file  Social History Narrative   Lives with wife in a one story home.  Has 2 children.  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  Education: 3 years of college.    Does wood working.     Outpatient Encounter Medications as of 03/06/2018  Medication Sig  . aspirin EC 81 MG tablet Take 81 mg by mouth daily.  . Cyanocobalamin (VITAMIN B-12 CR PO) Take by mouth daily.  Marland Kitchen ezetimibe (ZETIA) 10 MG tablet TAKE 1 TABLET BY MOUTH EVERY DAY  . losartan-hydrochlorothiazide (HYZAAR) 50-12.5 MG tablet Take 1 tablet by mouth daily.  . metoprolol tartrate (LOPRESSOR) 25 MG tablet TAKE 1 TABLET BY MOUTH TWICE DAILY  . metoprolol tartrate (LOPRESSOR) 25 MG tablet TAKE 1 TABLET BY MOUTH TWICE DAILY  . Multiple Vitamin (MULTIVITAMIN WITH MINERALS) TABS tablet Take 1 tablet by mouth daily.  Marland Kitchen NITROSTAT 0.4 MG SL tablet PLACE 1 TABLET BY MOUTH UNDER TONGUE EVERY 5 MINUTES AS NEEDED FOR CHEST PAIN  . omeprazole (PRILOSEC) 40 MG capsule Take 40  mg by mouth daily.  . simvastatin (ZOCOR) 40 MG tablet Take 1 tablet (40 mg total) by mouth daily at 6  PM.  . tamsulosin (FLOMAX) 0.4 MG CAPS capsule Take 1 capsule (0.4 mg total) 2 (two) times daily by mouth.   No facility-administered encounter medications on file as of 03/06/2018.     Activities of Daily Living In your present state of health, do you have any difficulty performing the following activities: 03/06/2018  Hearing? N  Vision? N  Difficulty concentrating or making decisions? N  Walking or climbing stairs? N  Dressing or bathing? N  Doing errands, shopping? N  Preparing Food and eating ? N  Using the Toilet? N  In the past six months, have you accidently leaked urine? Y  Do you have problems with loss of bowel control? N  Managing your Medications? N  Managing your Finances? N  Some recent data might be hidden    Patient Care Team: Lucretia Kern, DO as PCP - General (Family Medicine) Sueanne Margarita, MD as Consulting Physician (Cardiology)   Assessment:   This is a routine wellness examination for [facility].  Exercise Activities and Dietary recommendations    Goals    . Exercise 150 minutes per week (moderate activity)     [facility]  Longer walks; lives in [place];  Walk in the am; walk one mile - 5 days  [place] is one mile around     . patient      Raise bowling average -188  Goal is 190      . Weight (lb) < 180 lb (81.6 kg)     Eating consistently  Using thyme or fennel when cooking green beans Use the white bulb of the fennel stem   Fat free or low fat dairy products Fish high in omega-3 acids ( salmon, tuna, trout) Fruits, such as apples, bananas, oranges, pears, prunes Legumes, such as kidney beans, lentils, checkpeas, black-eyed peas and lima beans Vegetables; broccoli, cabbage, carrots Whole grains;   Plant fats are better; decrease "white" foods as pasta, rice, bread and desserts, sugar; Avoid red meat (limiting)  palm and coconut oils; sugary foods and beverages  Two nutrients that raise blood chol levels are saturated fats and trans fat; in hydrogenated oils and fats, as stick margarine, baked goods (cookes, cakes, pies, crackers; frosting; and coffee creamers;   Some Fats lower cholesterol: Monounsaturated and polyunsaturated  Avocados Corn, sunflower, and soybean oils Nuts and seeds, such as walnuts Olive, canola, peanut, safflower, and sesame oils Peanut butter Salmon and trout Tofu          Fall Risk Fall Risk  03/06/2018 06/01/2017 02/16/2017 02/15/2017 08/24/2016  Falls in the past year? (No Data) No No No No  Comment not recently  - - - -  Number falls in past yr: - - - - -  Follow up - - - - -     Depression Screen PHQ 2/9 Scores 03/06/2018 06/01/2017 02/15/2017 08/24/2016  PHQ - 2 Score 0 0 0 0    Cognitive Function MMSE - Mini Mental State Exam 08/24/2016  Not completed: (No Data)   .ad8 score is 0     6CIT Screen 03/06/2018 04/23/2016  What Year? 0 points 0 points  What month? 0 points 0 points  What time? 0 points 0 points  Count back from 20 0 points 0 points  Months in reverse 0 points 0 points  Repeat phrase 0 points 0 points  Total Score 0 0    Immunization History  Administered Date(s) Administered  . Influenza  Split 03/03/2011, 02/01/2012, 04/27/2013  . Influenza Whole 05/24/2004  . Influenza, High Dose Seasonal PF 01/16/2016  . Influenza,inj,Quad PF,6+ Mos 02/22/2013, 03/13/2014  . Influenza-Unspecified 03/07/2015, 02/19/2017  . Pneumococcal Conjugate-13 03/21/2014  . Pneumococcal Polysaccharide-23 10/03/2008  . Td 05/24/2002  . Tetanus 03/21/2014    Screening Tests Health Maintenance  Topic Date Due  . FOOT EXAM  06/30/1955  . OPHTHALMOLOGY EXAM  06/30/1955  . HEMOGLOBIN A1C  12/07/2017  . INFLUENZA VACCINE  12/22/2017  . COLONOSCOPY  05/25/2023  . TETANUS/TDAP  03/21/2024  . Hepatitis C Screening  Completed  . PNA vac Low Risk Adult  Completed           Plan:      PCP Notes   Health Maintenance  Takes flu vaccine from the pharmacist he bowls with  Educate regarding shingrix   Will schedule his eye exam or encouraged him to do so  Colonoscopy 05/2013 and repeat 05/2023   Smoking hx; VAS u/s AAA 09/27/2016   PSA -10/2016 Some bladder issues but controlled   Will make apt with Dr. Maudie Mercury   Abnormal Screens  no  Referrals  None Did discuss filing for VA benefits as [place] vet   Patient concerns; OA nodules on right Great toe   Nurse Concerns; As noted  Next PCP apt 10/22 To schedule to fup on A1c etc when he left today    I have personally reviewed and noted the following in the patient's chart:   . Medical and social history . Use of alcohol, tobacco or illicit drugs  . Current medications and supplements . Functional ability and status . Nutritional status . Physical activity . Advanced directives . List of other physicians . Hospitalizations, surgeries, and ER visits in previous 12 months . Vitals . Screenings to include cognitive, depression, and falls . Referrals and appointments  In addition, I have reviewed and discussed with patient certain preventive protocols, quality metrics, and best practice recommendations. A written personalized care plan for preventive services as well as general preventive health recommendations were provided to patient.     EVOJJ,KKXFG, RN  03/06/2018  I have read this note and agree with its contents. Alysia Penna, MD

## 2018-03-11 NOTE — Progress Notes (Signed)
HPI:  Using dictation device. Unfortunately this device frequently misinterprets words/phrases.  Carl Harrell is a pleasant 72 y.o. here for follow up. Chronic medical problems summarized below were reviewed for changes and stability and were updated as needed below. These issues and their treatment remain stable for the most part.  Reports is doing well.  No complaints today.  Reports he had his eye exam last year and was told it was normal.  He did not understand why he needed to do this.  He also questions whether he even has diabetes.  Reports he is getting some activity and trying to eat a healthy.  Reports he saw his urologist recently and now is on twice daily Flomax.  Doing well. Denies CP, SOB, worsening DOE, treatment intolerance or new symptoms.  awv 02/2018   Diabetes: -with cad, hld, hypertension -new diagnosis in 2018 and he preferred to treat with lifestyle changes -eye exam  CAD/HTN/HLD/Obesity: -sees cardiologist - Dr. Fransico Him -meds:asa, statin, zetia, losartan-hctz (? Held in hospital 10/18 and not restarted), metoprolol  GERD: -meds: prilosec  Hx Prostate Ca: -seeing urology/onc/rad onc for management -dx in 2018 -s/p radiation treatment 04/29/17  ROS: See pertinent positives and negatives per HPI.  Past Medical History:  Diagnosis Date  . ALLERGIC RHINITIS 01/30/2007  . Arthritis    "hands" (05/29/2014)  . Coronary artery disease    LHC (05/29/14): pLAD 50-70, pRCA 99 (L>R collats), EF 55% >> PCI: 3.5 x 28 mm Xience Alpine DES to [employer]  . Cough secondary to angiotensin converting enzyme inhibitor (ACE-I) 05/28/2013  . ED (erectile dysfunction)   . GERD 09/26/2007  . HYPERLIPIDEMIA 09/26/2007  . Hypertension   . PLANTAR FASCIITIS, BILATERAL 07/08/2009  . Unspecified hearing loss 06/16/2009   wears aides both ears    Past Surgical History:  Procedure Laterality Date  . CARDIAC CATHETERIZATION  05/29/2014   Procedure: CORONARY STENT INTERVENTION;   Surgeon: Sinclair Grooms, MD;  Location: [facility] CATH LAB;  Service: Cardiovascular;;  Prox RCA  . CORONARY ANGIOPLASTY WITH STENT PLACEMENT  05/29/2014   "1"  . KNEE ARTHROSCOPY Right ~ 2005  . LEFT HEART CATHETERIZATION WITH CORONARY ANGIOGRAM N/A 05/29/2014   Procedure: LEFT HEART CATHETERIZATION WITH CORONARY ANGIOGRAM;  Surgeon: Sinclair Grooms, MD;  Location: [facility] CATH LAB;  Service: Cardiovascular;  Laterality: N/A;    Family History  Problem Relation Age of Onset  . Heart attack Mother   . Heart disease Mother   . Heart attack Father   . Heart disease Father   . COPD Neg Hx        family hx  . Hyperlipidemia Neg Hx        family hx    SOCIAL HX: see hpi   Current Outpatient Medications:  .  aspirin EC 81 MG tablet, Take 81 mg by mouth daily., Disp: , Rfl:  .  Cyanocobalamin (VITAMIN B-12 CR PO), Take by mouth daily., Disp: , Rfl:  .  ezetimibe (ZETIA) 10 MG tablet, TAKE 1 TABLET BY MOUTH EVERY DAY, Disp: 90 tablet, Rfl: 3 .  losartan-hydrochlorothiazide (HYZAAR) 50-12.5 MG tablet, Take 1 tablet by mouth daily., Disp: 90 tablet, Rfl: 3 .  metoprolol tartrate (LOPRESSOR) 25 MG tablet, TAKE 1 TABLET BY MOUTH TWICE DAILY, Disp: 180 tablet, Rfl: 0 .  Multiple Vitamin (MULTIVITAMIN WITH MINERALS) TABS tablet, Take 1 tablet by mouth daily., Disp: , Rfl:  .  NITROSTAT 0.4 MG SL tablet, PLACE 1 TABLET BY MOUTH UNDER  TONGUE EVERY 5 MINUTES AS NEEDED FOR CHEST PAIN, Disp: 25 tablet, Rfl: 12 .  omeprazole (PRILOSEC) 40 MG capsule, Take 40 mg by mouth daily., Disp: , Rfl:  .  simvastatin (ZOCOR) 40 MG tablet, Take 1 tablet (40 mg total) by mouth daily at 6 PM., Disp: 90 tablet, Rfl: 2 .  tamsulosin (FLOMAX) 0.4 MG CAPS capsule, Take 1 capsule (0.4 mg total) 2 (two) times daily by mouth., Disp: 60 capsule, Rfl: 5  EXAM:  Vitals:   03/14/18 0858  BP: 112/68  Pulse: 65  Temp: 97.6 F (36.4 C)    Body mass index is 32.56 kg/m.  GENERAL: vitals reviewed and listed above, alert,  oriented, appears well hydrated and in no acute distress  HEENT: atraumatic, conjunttiva clear, no obvious abnormalities on inspection of external nose and ears  NECK: no obvious masses on inspection  LUNGS: clear to auscultation bilaterally, no wheezes, rales or rhonchi, good air movement  CV: HRRR, no peripheral edema  MS: moves all extremities without noticeable abnormality  PSYCH: pleasant and cooperative, no obvious depression or anxiety  ASSESSMENT AND PLAN:  Discussed the following assessment and plan:  Controlled type 2 diabetes mellitus with complication, without long-term current use of insulin ([place]) - Plan: Hemoglobin A1c  Hypertension associated with diabetes ([place]) - Plan: Basic metabolic panel, CBC  Hyperlipidemia associated with type 2 diabetes mellitus (HCC)  Coronary artery disease involving native coronary artery of native heart without angina pectoris  PAD (peripheral artery disease) ([place]), Chronic  -Discussed implications of diabetes, reasoning behind her regular diabetic eye exam and advised to do at least every 2 years of normal, otherwise yearly if abnormal -He agrees to consider, advised assistant to obtain report from [facility], he reports he did this last year and it was normal -Labs per orders -Lifestyle recommendations summarized below -Follow-up 3 to 4 months  Patient Instructions  BEFORE YOU LEAVE: -labs -ontain report from [facility] and update, q2y if no retinopathy -follow up: 3-4 months  We have ordered labs or studies at this visit. It can take up to 1-2 weeks for results and processing. IF results require follow up or explanation, we will call you with instructions. Clinically stable results will be released to your [facility]. If you have not heard from [place] or cannot find your results in [facility] in 2 weeks please contact our office at [phone].  If you are not yet signed up for [facility], please consider signing up.   We recommend the  following healthy lifestyle for LIFE: 1) Small portions. But, make sure to get regular (at least 3 per day), healthy meals and small healthy snacks if needed.  2) Eat a healthy clean diet.   TRY TO EAT: -at least 5-7 servings of low sugar, colorful, and nutrient rich vegetables per day (not corn, potatoes or bananas.) -berries are the best choice if you wish to eat fruit (only eat small amounts if trying to reduce weight)  -lean meets (fish, white meat of chicken or [place]) -vegan proteins for some meals - beans or tofu, whole grains, nuts and seeds -Replace bad fats with good fats - good fats include: fish, nuts and seeds, canola oil, olive oil -small amounts of low fat or non fat dairy -small amounts of100 % whole grains - check the lables -drink plenty of water  AVOID: -SUGAR, sweets, anything with added sugar, corn syrup or sweeteners - must read labels as even foods advertised as "healthy" often are loaded with sugar -  if you must have a sweetener, small amounts of stevia may be best -sweetened beverages and artificially sweetened beverages -simple starches (rice, bread, potatoes, pasta, chips, etc - small amounts of 100% whole grains are ok) -red meat, pork, butter -fried foods, fast food, processed food, excessive dairy, eggs and coconut.  3)Get at least 150 minutes of sweaty aerobic exercise per week.  4)Reduce stress - consider counseling, meditation and relaxation to balance other aspects of your life.          Lucretia Kern, DO

## 2018-03-14 ENCOUNTER — Ambulatory Visit (INDEPENDENT_AMBULATORY_CARE_PROVIDER_SITE_OTHER): Payer: Medicare Other | Admitting: Family Medicine

## 2018-03-14 ENCOUNTER — Encounter: Payer: Self-pay | Admitting: Family Medicine

## 2018-03-14 VITALS — BP 112/68 | HR 65 | Temp 97.6°F | Ht 67.5 in | Wt 211.0 lb

## 2018-03-14 DIAGNOSIS — E118 Type 2 diabetes mellitus with unspecified complications: Secondary | ICD-10-CM | POA: Diagnosis not present

## 2018-03-14 DIAGNOSIS — E785 Hyperlipidemia, unspecified: Secondary | ICD-10-CM

## 2018-03-14 DIAGNOSIS — E1159 Type 2 diabetes mellitus with other circulatory complications: Secondary | ICD-10-CM | POA: Diagnosis not present

## 2018-03-14 DIAGNOSIS — E1169 Type 2 diabetes mellitus with other specified complication: Secondary | ICD-10-CM

## 2018-03-14 DIAGNOSIS — I739 Peripheral vascular disease, unspecified: Secondary | ICD-10-CM | POA: Insufficient documentation

## 2018-03-14 DIAGNOSIS — I10 Essential (primary) hypertension: Secondary | ICD-10-CM

## 2018-03-14 DIAGNOSIS — I2510 Atherosclerotic heart disease of native coronary artery without angina pectoris: Secondary | ICD-10-CM

## 2018-03-14 LAB — CBC
HEMATOCRIT: 45.3 % (ref 39.0–52.0)
Hemoglobin: 14.9 g/dL (ref 13.0–17.0)
MCHC: 32.8 g/dL (ref 30.0–36.0)
MCV: 84.6 fl (ref 78.0–100.0)
Platelets: 266 10*3/uL (ref 150.0–400.0)
RBC: 5.35 Mil/uL (ref 4.22–5.81)
RDW: 13.8 % (ref 11.5–15.5)
WBC: 7.7 10*3/uL (ref 4.0–10.5)

## 2018-03-14 LAB — BASIC METABOLIC PANEL
BUN: 13 mg/dL (ref 6–23)
CALCIUM: 8.9 mg/dL (ref 8.4–10.5)
CHLORIDE: 103 meq/L (ref 96–112)
CO2: 27 meq/L (ref 19–32)
Creatinine, Ser: 1.14 mg/dL (ref 0.40–1.50)
GFR: 66.61 mL/min (ref >60.00)
GLUCOSE: 114 mg/dL — AB (ref 70–99)
Potassium: 4.2 mEq/L (ref 3.5–5.1)
Sodium: 139 mEq/L (ref 135–145)

## 2018-03-14 LAB — HEMOGLOBIN A1C: HEMOGLOBIN A1C: 6.5 % (ref 4.6–6.5)

## 2018-03-14 NOTE — Patient Instructions (Signed)
BEFORE YOU LEAVE: -labs -ontain report from [facility] and update, q2y if no retinopathy -follow up: 3-4 months  We have ordered labs or studies at this visit. It can take up to 1-2 weeks for results and processing. IF results require follow up or explanation, we will call you with instructions. Clinically stable results will be released to your [facility]. If you have not heard from [place] or cannot find your results in [facility] in 2 weeks please contact our office at [phone].  If you are not yet signed up for [facility], please consider signing up.   We recommend the following healthy lifestyle for LIFE: 1) Small portions. But, make sure to get regular (at least 3 per day), healthy meals and small healthy snacks if needed.  2) Eat a healthy clean diet.   TRY TO EAT: -at least 5-7 servings of low sugar, colorful, and nutrient rich vegetables per day (not corn, potatoes or bananas.) -berries are the best choice if you wish to eat fruit (only eat small amounts if trying to reduce weight)  -lean meets (fish, white meat of chicken or [place]) -vegan proteins for some meals - beans or tofu, whole grains, nuts and seeds -Replace bad fats with good fats - good fats include: fish, nuts and seeds, canola oil, olive oil -small amounts of low fat or non fat dairy -small amounts of100 % whole grains - check the lables -drink plenty of water  AVOID: -SUGAR, sweets, anything with added sugar, corn syrup or sweeteners - must read labels as even foods advertised as "healthy" often are loaded with sugar -if you must have a sweetener, small amounts of stevia may be best -sweetened beverages and artificially sweetened beverages -simple starches (rice, bread, potatoes, pasta, chips, etc - small amounts of 100% whole grains are ok) -red meat, pork, butter -fried foods, fast food, processed food, excessive dairy, eggs and coconut.  3)Get at least 150 minutes of sweaty aerobic exercise per week.  4)Reduce  stress - consider counseling, meditation and relaxation to balance other aspects of your life.

## 2018-05-08 ENCOUNTER — Other Ambulatory Visit: Payer: Self-pay | Admitting: Family Medicine

## 2018-05-08 NOTE — Telephone Encounter (Signed)
Pt has Omeprazole (Priolosec) 40 mg on current medication list and not Nexium.Clarificaiton needed if the pt would like to have refill of Prilosec or if he is requesting a new prescription.

## 2018-05-08 NOTE — Telephone Encounter (Signed)
Copied from [place] [id number]. Topic: Quick Communication - Rx Refill/Question >> May 08, 2018  3:49 PM Bea Graff, NT wrote: Medication: NEXIUM 40 MG capsule  Has the patient contacted their pharmacy? Yes.   (Agent: If no, request that the patient contact the pharmacy for the refill.) (Agent: If yes, when and what did the pharmacy advise?)  Preferred Pharmacy (with phone number or street name): [facility] [place], [place] - [address] [phone] (Phone) [phone] (Fax)    Agent: Please be advised that RX refills may take up to 3 business days. We ask that you follow-up with your pharmacy.

## 2018-05-09 MED ORDER — OMEPRAZOLE 40 MG PO CPDR: 40.0000 mg | DELAYED_RELEASE_CAPSULE | Freq: Every day | ORAL | 0 refills | Status: DC

## 2018-05-09 NOTE — Telephone Encounter (Signed)
Pt. Wants to stay on Prilosec.

## 2018-05-09 NOTE — Addendum Note (Signed)
Addended by: Linus Orn A on: 05/09/2018 08:25 AM   Modules accepted: Orders

## 2018-05-10 ENCOUNTER — Encounter: Payer: Self-pay | Admitting: Family Medicine

## 2018-05-12 ENCOUNTER — Encounter: Payer: Self-pay | Admitting: Family Medicine

## 2018-05-12 ENCOUNTER — Ambulatory Visit (INDEPENDENT_AMBULATORY_CARE_PROVIDER_SITE_OTHER): Payer: Medicare Other | Admitting: Family Medicine

## 2018-05-12 VITALS — BP 112/68 | HR 70 | Temp 97.3°F | Wt 211.1 lb

## 2018-05-12 DIAGNOSIS — J069 Acute upper respiratory infection, unspecified: Secondary | ICD-10-CM | POA: Diagnosis not present

## 2018-05-12 NOTE — Progress Notes (Signed)
   Subjective:    Patient ID: Carl Harrell, male    DOB: [birth year], 72 y.o.   MRN: [id number]  HPI Here for 2 weeks of chest tightness and a dry cough. No fever. He says he feels much better today. Drinking fluids.    Review of Systems  Constitutional: Negative.   HENT: Positive for congestion. Negative for postnasal drip, sinus pressure, sinus pain and sore throat.   Eyes: Negative.   Respiratory: Positive for cough and chest tightness.        Objective:   Physical Exam Constitutional:      Appearance: Normal appearance.  HENT:     Right Ear: Tympanic membrane and ear canal normal.     Left Ear: Tympanic membrane and ear canal normal.     Nose: Nose normal.     Mouth/Throat:     Pharynx: Oropharynx is clear.  Eyes:     Conjunctiva/sclera: Conjunctivae normal.  Pulmonary:     Effort: Pulmonary effort is normal. No respiratory distress.     Breath sounds: Normal breath sounds. No stridor. No wheezing, rhonchi or rales.  Lymphadenopathy:     Cervical: No cervical adenopathy.  Neurological:     Mental Status: He is alert.           Assessment & Plan:  Viral URI, he seems to be getting better. Try Delsym and recheck prn.  Alysia Penna, MD

## 2018-06-24 ENCOUNTER — Other Ambulatory Visit: Payer: Self-pay | Admitting: Family Medicine

## 2018-07-16 DIAGNOSIS — E1169 Type 2 diabetes mellitus with other specified complication: Secondary | ICD-10-CM | POA: Insufficient documentation

## 2018-07-16 DIAGNOSIS — E785 Hyperlipidemia, unspecified: Secondary | ICD-10-CM

## 2018-07-16 NOTE — Progress Notes (Signed)
HPI:  Using dictation device. Unfortunately this device frequently misinterprets words/phrases.  Carl Harrell is a pleasant 73 y.o. here for follow up. Chronic medical problems summarized below were reviewed for changes and stability and were updated as needed below. These issues and their treatment remain stable for the most part.  Reports doing well and complaints. Exercises at [employer] a few days per week and bowls a few days per week. Diet is poor he admits. Does not like vegetables much. Denies CP, SOB, DOE, treatment intolerance or new symptoms. Due for labs awv 02/2018  Diabetes: -with cad, hld, hypertension -new diagnosis in 2018 and he preferred to treat with lifestyle changes -eye exam -meds: statin, arb  CAD/HTN/HLD/Obesity: -sees cardiologist - Dr. Fransico Him -meds:asa, statin, zetia, losartan-hctz, metoprolol  GERD: -meds: prilosec  HxProstate Ca: -seeing urology/onc/rad oncfor management -dx in 2018 -s/p radiation treatment 04/29/17  ROS: See pertinent positives and negatives per HPI.  Past Medical History:  Diagnosis Date  . ALLERGIC RHINITIS 01/30/2007  . Arthritis    "hands" (05/29/2014)  . Coronary artery disease    LHC (05/29/14): pLAD 50-70, pRCA 99 (L>R collats), EF 55% >> PCI: 3.5 x 28 mm Xience Alpine DES to [employer]  . Cough secondary to angiotensin converting enzyme inhibitor (ACE-I) 05/28/2013  . ED (erectile dysfunction)   . GERD 09/26/2007  . HYPERLIPIDEMIA 09/26/2007  . Hypertension   . PLANTAR FASCIITIS, BILATERAL 07/08/2009  . Unspecified hearing loss 06/16/2009   wears aides both ears    Past Surgical History:  Procedure Laterality Date  . CARDIAC CATHETERIZATION  05/29/2014   Procedure: CORONARY STENT INTERVENTION;  Surgeon: Sinclair Grooms, MD;  Location: [facility] CATH LAB;  Service: Cardiovascular;;  Prox RCA  . CORONARY ANGIOPLASTY WITH STENT PLACEMENT  05/29/2014   "1"  . KNEE ARTHROSCOPY Right ~ 2005  . LEFT HEART CATHETERIZATION WITH CORONARY  ANGIOGRAM N/A 05/29/2014   Procedure: LEFT HEART CATHETERIZATION WITH CORONARY ANGIOGRAM;  Surgeon: Sinclair Grooms, MD;  Location: [facility] CATH LAB;  Service: Cardiovascular;  Laterality: N/A;    Family History  Problem Relation Age of Onset  . Heart attack Mother   . Heart disease Mother   . Heart attack Father   . Heart disease Father   . COPD Neg Hx        family hx  . Hyperlipidemia Neg Hx        family hx    SOCIAL HX: see hpi   Current Outpatient Medications:  .  aspirin EC 81 MG tablet, Take 81 mg by mouth daily., Disp: , Rfl:  .  Cyanocobalamin (VITAMIN B-12 CR PO), Take by mouth daily., Disp: , Rfl:  .  ezetimibe (ZETIA) 10 MG tablet, TAKE 1 TABLET BY MOUTH EVERY DAY, Disp: 90 tablet, Rfl: 3 .  losartan-hydrochlorothiazide (HYZAAR) 50-12.5 MG tablet, Take 1 tablet by mouth daily., Disp: 90 tablet, Rfl: 3 .  metoprolol tartrate (LOPRESSOR) 25 MG tablet, TAKE 1 TABLET BY MOUTH TWICE DAILY., Disp: 60 tablet, Rfl: 5 .  Multiple Vitamin (MULTIVITAMIN WITH MINERALS) TABS tablet, Take 1 tablet by mouth daily., Disp: , Rfl:  .  NITROSTAT 0.4 MG SL tablet, PLACE 1 TABLET BY MOUTH UNDER TONGUE EVERY 5 MINUTES AS NEEDED FOR CHEST PAIN, Disp: 25 tablet, Rfl: 12 .  omeprazole (PRILOSEC) 40 MG capsule, Take 1 capsule (40 mg total) by mouth daily., Disp: 90 capsule, Rfl: 0 .  simvastatin (ZOCOR) 40 MG tablet, Take 1 tablet (40 mg total) by mouth  daily at 6 PM., Disp: 90 tablet, Rfl: 2 .  tamsulosin (FLOMAX) 0.4 MG CAPS capsule, Take 1 capsule (0.4 mg total) 2 (two) times daily by mouth., Disp: 60 capsule, Rfl: 5  EXAM:  Vitals:   07/18/18 0920  BP: 102/60  Pulse: 63  Temp: 98.2 F (36.8 C)    Body mass index is 32.88 kg/m.  GENERAL: vitals reviewed and listed above, alert, oriented, appears well hydrated and in no acute distress  HEENT: atraumatic, conjunttiva clear, no obvious abnormalities on inspection of external nose and ears  NECK: no obvious masses on inspection  LUNGS:  clear to auscultation bilaterally, no wheezes, rales or rhonchi, good air movement  CV: HRRR, no peripheral edema  MS: moves all extremities without noticeable abnormality  PSYCH: pleasant and cooperative, no obvious depression or anxiety  ASSESSMENT AND PLAN:  Discussed the following assessment and plan:  Controlled type 2 diabetes mellitus with complication, without long-term current use of insulin ([place]) - Plan: Hemoglobin A1c  Hyperlipidemia associated with type 2 diabetes mellitus ([place]) - Plan: Lipid panel  Hypertension associated with diabetes ([place]) - Plan: Basic metabolic panel, CBC  PAD (peripheral artery disease) ([facility])  Coronary artery disease involving native coronary artery of native heart without angina pectoris  -labs per orders - fasting -lifestyle recs - discussed some options he likes for adding veggies, encourage continued exercise -he reports he has cardiology follow up in next few weeks -advised he call to schedule his diabetic eye exam with his opthomologist -follow up 3-4 months, sooner as needed -Patient advised to return or notify a doctor immediately if symptoms worsen or persist or new concerns arise.  Patient Instructions  BEFORE YOU LEAVE: -labs -follow up: 3-4 months  Please schedule your Diabetic Eye Exam and bring [place] a copy of the exam  See your cardiologist as planned for follow up  We have ordered labs or studies at this visit. It can take up to 1-2 weeks for results and processing. IF results require follow up or explanation, we will call you with instructions. Clinically stable results will be released to your [facility] If you have not heard from [place] or cannot find your results in [facility] in 2 weeks please contact our office at [phone].  If you are not yet signed up for [facility], please consider signing up.   We recommend the following healthy lifestyle for LIFE: 1) Small portions. But, make sure to get regular (at least 3 per day),  healthy meals and small healthy snacks if needed.  2) Eat a healthy clean diet.   TRY TO EAT: -at least 5-7 servings of low sugar, colorful, and nutrient rich vegetables per day (not corn, potatoes or bananas.) -berries are the best choice if you wish to eat fruit (only eat small amounts if trying to reduce weight)  -lean meets (fish, white meat of chicken or [place]) -vegan proteins for some meals - beans or tofu, whole grains, nuts and seeds -Replace bad fats with good fats - good fats include: fish, nuts and seeds, canola oil, olive oil -small amounts of low fat or non fat dairy -small amounts of100 % whole grains - check the lables -drink plenty of water  AVOID: -SUGAR, sweets, anything with added sugar, corn syrup or sweeteners - must read labels as even foods advertised as "healthy" often are loaded with sugar -if you must have a sweetener, small amounts of stevia may be best -sweetened beverages and artificially sweetened beverages -simple starches (rice, bread, potatoes,  pasta, chips, etc - small amounts of 100% whole grains are ok) -red meat, pork, butter -fried foods, fast food, processed food, excessive dairy, eggs and coconut.  3)Get at least 150 minutes of sweaty aerobic exercise per week.            Lucretia Kern, DO

## 2018-07-18 ENCOUNTER — Ambulatory Visit (INDEPENDENT_AMBULATORY_CARE_PROVIDER_SITE_OTHER): Payer: Medicare Other | Admitting: Family Medicine

## 2018-07-18 ENCOUNTER — Encounter: Payer: Self-pay | Admitting: Family Medicine

## 2018-07-18 VITALS — BP 102/60 | HR 63 | Temp 98.2°F | Ht 67.5 in | Wt 213.1 lb

## 2018-07-18 DIAGNOSIS — I152 Hypertension secondary to endocrine disorders: Secondary | ICD-10-CM

## 2018-07-18 DIAGNOSIS — E1159 Type 2 diabetes mellitus with other circulatory complications: Secondary | ICD-10-CM | POA: Diagnosis not present

## 2018-07-18 DIAGNOSIS — I10 Essential (primary) hypertension: Secondary | ICD-10-CM

## 2018-07-18 DIAGNOSIS — I739 Peripheral vascular disease, unspecified: Secondary | ICD-10-CM

## 2018-07-18 DIAGNOSIS — I2510 Atherosclerotic heart disease of native coronary artery without angina pectoris: Secondary | ICD-10-CM | POA: Diagnosis not present

## 2018-07-18 DIAGNOSIS — E785 Hyperlipidemia, unspecified: Secondary | ICD-10-CM | POA: Diagnosis not present

## 2018-07-18 DIAGNOSIS — E1169 Type 2 diabetes mellitus with other specified complication: Secondary | ICD-10-CM | POA: Diagnosis not present

## 2018-07-18 DIAGNOSIS — E118 Type 2 diabetes mellitus with unspecified complications: Secondary | ICD-10-CM | POA: Diagnosis not present

## 2018-07-18 LAB — LIPID PANEL
Cholesterol: 125 mg/dL (ref 0–200)
HDL: 39.9 mg/dL (ref >39.00)
LDL Cholesterol: 62 mg/dL (ref 0–99)
NONHDL: 85.49
Total CHOL/HDL Ratio: 3
Triglycerides: 115 mg/dL (ref 0.0–149.0)
VLDL: 23 mg/dL (ref 0.0–40.0)

## 2018-07-18 LAB — BASIC METABOLIC PANEL
BUN: 16 mg/dL (ref 6–23)
CHLORIDE: 103 meq/L (ref 96–112)
CO2: 30 meq/L (ref 19–32)
CREATININE: 1.12 mg/dL (ref 0.40–1.50)
Calcium: 9 mg/dL (ref 8.4–10.5)
GFR: 63.91 mL/min (ref >60.00)
Glucose, Bld: 106 mg/dL — ABNORMAL HIGH (ref 70–99)
Potassium: 4.2 mEq/L (ref 3.5–5.1)
Sodium: 140 mEq/L (ref 135–145)

## 2018-07-18 LAB — CBC
HEMATOCRIT: 45.5 % (ref 39.0–52.0)
Hemoglobin: 15 g/dL (ref 13.0–17.0)
MCHC: 33 g/dL (ref 30.0–36.0)
MCV: 84.5 fl (ref 78.0–100.0)
Platelets: 246 10*3/uL (ref 150.0–400.0)
RBC: 5.38 Mil/uL (ref 4.22–5.81)
RDW: 13.9 % (ref 11.5–15.5)
WBC: 8 10*3/uL (ref 4.0–10.5)

## 2018-07-18 LAB — HEMOGLOBIN A1C: HEMOGLOBIN A1C: 6.8 % — AB (ref 4.6–6.5)

## 2018-07-18 NOTE — Patient Instructions (Signed)
BEFORE YOU LEAVE: -labs -follow up: 3-4 months  Please schedule your Diabetic Eye Exam and bring [place] a copy of the exam  See your cardiologist as planned for follow up  We have ordered labs or studies at this visit. It can take up to 1-2 weeks for results and processing. IF results require follow up or explanation, we will call you with instructions. Clinically stable results will be released to your [facility]. If you have not heard from [place] or cannot find your results in [facility] in 2 weeks please contact our office at [phone].  If you are not yet signed up for [facility], please consider signing up.   We recommend the following healthy lifestyle for LIFE: 1) Small portions. But, make sure to get regular (at least 3 per day), healthy meals and small healthy snacks if needed.  2) Eat a healthy clean diet.   TRY TO EAT: -at least 5-7 servings of low sugar, colorful, and nutrient rich vegetables per day (not corn, potatoes or bananas.) -berries are the best choice if you wish to eat fruit (only eat small amounts if trying to reduce weight)  -lean meets (fish, white meat of chicken or [place]) -vegan proteins for some meals - beans or tofu, whole grains, nuts and seeds -Replace bad fats with good fats - good fats include: fish, nuts and seeds, canola oil, olive oil -small amounts of low fat or non fat dairy -small amounts of100 % whole grains - check the lables -drink plenty of water  AVOID: -SUGAR, sweets, anything with added sugar, corn syrup or sweeteners - must read labels as even foods advertised as "healthy" often are loaded with sugar -if you must have a sweetener, small amounts of stevia may be best -sweetened beverages and artificially sweetened beverages -simple starches (rice, bread, potatoes, pasta, chips, etc - small amounts of 100% whole grains are ok) -red meat, pork, butter -fried foods, fast food, processed food, excessive dairy, eggs and coconut.  3)Get at least 150  minutes of sweaty aerobic exercise per week.

## 2018-07-22 ENCOUNTER — Other Ambulatory Visit: Payer: Self-pay | Admitting: Family Medicine

## 2018-07-28 ENCOUNTER — Other Ambulatory Visit: Payer: Self-pay | Admitting: Family Medicine

## 2018-08-22 ENCOUNTER — Other Ambulatory Visit: Payer: Self-pay | Admitting: Family Medicine

## 2018-08-25 ENCOUNTER — Other Ambulatory Visit: Payer: Self-pay | Admitting: Cardiology

## 2018-08-25 DIAGNOSIS — I10 Essential (primary) hypertension: Secondary | ICD-10-CM

## 2018-09-02 ENCOUNTER — Other Ambulatory Visit: Payer: Self-pay | Admitting: Cardiology

## 2018-09-02 DIAGNOSIS — I10 Essential (primary) hypertension: Secondary | ICD-10-CM

## 2018-09-04 ENCOUNTER — Telehealth: Payer: Self-pay | Admitting: Cardiology

## 2018-09-04 NOTE — Telephone Encounter (Signed)
Pt calling stating that his pharmacy is unable to get losartan-HCTZ and would like to see if Dr. Radford Pax would send in an alternative so pt can get medication. Please address

## 2018-09-04 NOTE — Telephone Encounter (Signed)
Please find out from pharmacy if they can get Losartan and HCTZ separately

## 2018-09-05 MED ORDER — LOSARTAN POTASSIUM 50 MG PO TABS: 50.0000 mg | ORAL_TABLET | Freq: Every day | ORAL | 3 refills | Status: DC

## 2018-09-05 MED ORDER — HYDROCHLOROTHIAZIDE 12.5 MG PO CAPS: 12.5000 mg | ORAL_CAPSULE | Freq: Every day | ORAL | 3 refills | Status: DC

## 2018-09-05 NOTE — Telephone Encounter (Signed)
Called the Pharmacy, they have both losartan and HCTZ separate, reordered both. Left message for the patient to call back.

## 2018-09-07 NOTE — Telephone Encounter (Signed)
Attempted will call later.

## 2018-09-13 NOTE — Telephone Encounter (Signed)
Spoke with the patient, he received his medication and had no further questions.

## 2018-09-14 ENCOUNTER — Telehealth: Payer: Self-pay | Admitting: Cardiology

## 2018-09-14 NOTE — Telephone Encounter (Signed)
Video/doxy.me/smartphone/verbal consent 09/14/18/vitals.   YOUR CARDIOLOGY TEAM HAS ARRANGED FOR AN E-VISIT FOR YOUR APPOINTMENT - PLEASE REVIEW IMPORTANT INFORMATION BELOW SEVERAL DAYS PRIOR TO YOUR APPOINTMENT  Due to the recent COVID-19 pandemic, we are transitioning in-person office visits to tele-medicine visits in an effort to decrease unnecessary exposure to our patients, their families, and staff. These visits are billed to your insurance just like a normal visit is. We also encourage you to sign up for MyChart if you have not already done so. You will need a smartphone if possible. For patients that do not have this, we can still complete the visit using a regular telephone but do prefer a smartphone to enable video when possible. You may have a family member that lives with you that can help. If possible, we also ask that you have a blood pressure cuff and scale at home to measure your blood pressure, heart rate and weight prior to your scheduled appointment. Patients with clinical needs that need an in-person evaluation and testing will still be able to come to the office if absolutely necessary. If you have any questions, feel free to call our office.      YOUR PROVIDER WILL BE USING THE FOLLOWING PLATFORM TO COMPLETE YOUR VISIT:   [email or web address]        IF USING MYCHART - How to Download the MyChart App to Your SmartPhone   - If Apple, go to [employer] and type in MyChart in the search bar and download the app. If Android, ask patient to go to [employer] and type in [place] in the search bar and download the app. The app is free but as with any other app downloads, your phone may require you to verify saved payment information or Apple/Android password.  - You will need to then log into the app with your MyChart username and password, and select [facility] as your healthcare provider to link the account.  - When it is time for your visit, go to the MyChart app, find appointments,  and click Begin Video Visit. Be sure to Select Allow for your device to access the Microphone and Camera for your visit. You will then be connected, and your provider will be with you shortly.  **If you have any issues connecting or need assistance, please contact MyChart service desk [phone])**  **If using a computer, in order to ensure the best quality for your visit, you will need to use either of the following Internet Browsers: Insurance underwriter or [employer] Edge**   IF USING DOXIMITY or [email or web address] - The staff will give you instructions on receiving your link to join the meeting the day of your visit.      2-3 DAYS BEFORE YOUR APPOINTMENT  You will receive a telephone call from one of our [place] team members - your caller ID may say "Unknown caller." If this is a video visit, we will walk you through how to get the video launched on your phone. We will remind you check your blood pressure, heart rate and weight prior to your scheduled appointment. If you have an Apple Watch or Kardia, please upload any pertinent ECG strips the day before or morning of your appointment to [place]. Our staff will also make sure you have reviewed the consent and agree to move forward with your scheduled tele-health visit.     THE DAY OF YOUR APPOINTMENT  Approximately 15 minutes prior to your scheduled appointment, you will receive a telephone call from  one of [facility] team - your caller ID may say "Unknown caller."  Our staff will confirm medications, vital signs for the day and any symptoms you may be experiencing. Please have this information available prior to the time of visit start. It may also be helpful for you to have a pad of paper and pen handy for any instructions given during your visit. They will also walk you through joining the smartphone meeting if this is a video visit.    CONSENT FOR TELE-HEALTH VISIT - PLEASE REVIEW  I hereby voluntarily request, consent and authorize  [facility] and its employed or contracted physicians, physician assistants, nurse practitioners or other licensed health care professionals (the Practitioner), to provide me with telemedicine health care services (the Services") as deemed necessary by the treating Practitioner. I acknowledge and consent to receive the Services by the Practitioner via telemedicine. I understand that the telemedicine visit will involve communicating with the Practitioner through live audiovisual communication technology and the disclosure of certain medical information by electronic transmission. I acknowledge that I have been given the opportunity to request an in-person assessment or other available alternative prior to the telemedicine visit and am voluntarily participating in the telemedicine visit.  I understand that I have the right to withhold or withdraw my consent to the use of telemedicine in the course of my care at any time, without affecting my right to future care or treatment, and that the Practitioner or I may terminate the telemedicine visit at any time. I understand that I have the right to inspect all information obtained and/or recorded in the course of the telemedicine visit and may receive copies of available information for a reasonable fee.  I understand that some of the potential risks of receiving the Services via telemedicine include:   Delay or interruption in medical evaluation due to technological equipment failure or disruption;  Information transmitted may not be sufficient (e.g. poor resolution of images) to allow for appropriate medical decision making by the Practitioner; and/or   In rare instances, security protocols could fail, causing a breach of personal health information.  Furthermore, I acknowledge that it is my responsibility to provide information about my medical history, conditions and care that is complete and accurate to the best of my ability. I acknowledge that  Practitioner's advice, recommendations, and/or decision may be based on factors not within their control, such as incomplete or inaccurate data provided by me or distortions of diagnostic images or specimens that may result from electronic transmissions. I understand that the practice of medicine is not an exact science and that Practitioner makes no warranties or guarantees regarding treatment outcomes. I acknowledge that I will receive a copy of this consent concurrently upon execution via email to the email address I last provided but may also request a printed copy by calling the office of [place].    I understand that my insurance will be billed for this visit.   I have read or had this consent read to me.  I understand the contents of this consent, which adequately explains the benefits and risks of the Services being provided via telemedicine.   I have been provided ample opportunity to ask questions regarding this consent and the Services and have had my questions answered to my satisfaction.  I give my informed consent for the services to be provided through the use of telemedicine in my medical care  By participating in this telemedicine visit I agree to the above.

## 2018-09-18 ENCOUNTER — Telehealth (INDEPENDENT_AMBULATORY_CARE_PROVIDER_SITE_OTHER): Payer: Medicare Other | Admitting: Cardiology

## 2018-09-18 ENCOUNTER — Encounter: Payer: Self-pay | Admitting: Cardiology

## 2018-09-18 ENCOUNTER — Other Ambulatory Visit: Payer: Self-pay

## 2018-09-18 VITALS — BP 136/87 | HR 67 | Ht 67.5 in | Wt 210.0 lb

## 2018-09-18 DIAGNOSIS — Z7189 Other specified counseling: Secondary | ICD-10-CM | POA: Diagnosis not present

## 2018-09-18 DIAGNOSIS — I2510 Atherosclerotic heart disease of native coronary artery without angina pectoris: Secondary | ICD-10-CM | POA: Diagnosis not present

## 2018-09-18 DIAGNOSIS — E1169 Type 2 diabetes mellitus with other specified complication: Secondary | ICD-10-CM

## 2018-09-18 DIAGNOSIS — I152 Hypertension secondary to endocrine disorders: Secondary | ICD-10-CM

## 2018-09-18 DIAGNOSIS — E118 Type 2 diabetes mellitus with unspecified complications: Secondary | ICD-10-CM

## 2018-09-18 DIAGNOSIS — I10 Essential (primary) hypertension: Secondary | ICD-10-CM

## 2018-09-18 DIAGNOSIS — E785 Hyperlipidemia, unspecified: Secondary | ICD-10-CM

## 2018-09-18 DIAGNOSIS — E1159 Type 2 diabetes mellitus with other circulatory complications: Secondary | ICD-10-CM

## 2018-09-18 NOTE — Addendum Note (Signed)
Addended by: Sarina Ill on: 09/18/2018 04:57 PM   Modules accepted: Orders

## 2018-09-18 NOTE — Patient Instructions (Addendum)
Medication Instructions:  Your physician recommends that you continue on your current medications as directed. Please refer to the Current Medication list given to you today.  If you need a refill on your cardiac medications before your next appointment, please call your pharmacy.   Lab work: Liver: 12/04/18  If you have labs (blood work) drawn today and your tests are completely normal, you will receive your results only by: Marland Kitchen MyChart Message (if you have MyChart) OR . A paper copy in the mail If you have any lab test that is abnormal or we need to change your treatment, we will call you to review the results.  Testing/Procedures: None Follow-Up: At [facility], you and your health needs are our priority.  As part of our continuing mission to provide you with exceptional heart care, we have created designated Provider Care Teams.  These Care Teams include your primary Cardiologist (physician) and Advanced Practice Providers (APPs -  Physician Assistants and Nurse Practitioners) who all work together to provide you with the care you need, when you need it. You will need a follow up appointment in 1 Year  Please call our office 2 months in advance to schedule this appointment.  You may see Dr. Radford Pax or one of the following Advanced Practice Providers on your designated Care Team:   [place], PA-C Melina Copa, PA-C . Ermalinda Barrios, PA-C

## 2018-09-18 NOTE — Progress Notes (Signed)
Virtual Visit via Video Note   This visit type was conducted due to national recommendations for restrictions regarding the COVID-19 Pandemic (e.g. social distancing) in an effort to limit this patient's exposure and mitigate transmission in our community.  Due to his co-morbid illnesses, this patient is at least at moderate risk for complications without adequate follow up.  This format is felt to be most appropriate for this patient at this time.  All issues noted in this document were discussed and addressed.  A limited physical exam was performed with this format.  Please refer to the patient's chart for his consent to telehealth for [facility].  Evaluation Performed:  Follow-up visit  This visit type was conducted due to national recommendations for restrictions regarding the COVID-19 Pandemic (e.g. social distancing).  This format is felt to be most appropriate for this patient at this time.  All issues noted in this document were discussed and addressed.  No physical exam was performed (except for noted visual exam findings with Video Visits).  Please refer to the patient's chart (MyChart message for video visits and phone note for telephone visits) for the patient's consent to telehealth for [facility].  Date:  09/18/2018   ID:  Carl Harrell, DOB 1946-03-30, MRN [id number]  Patient Location:  home  Provider location:   [place]  PCP:  Lucretia Kern, DO  Cardiologist:  Fransico Him, MD Electrophysiologist:  None   Chief Complaint:  CAD, hyperlipidemia  History of Present Illness:    Carl Harrell is a 73 y.o. male who presents via audio/video conferencing for a telehealth visit today.    Carl Harrell is a 73y.o. male with a hx of TIA, HL, GERD, PAD, ASCAD with LHC 05/29/14 demonstratinga subtotally occluded native RCA with 99% proximal stenosis s/p DES. There was moderate stenosis noted in the LAD at 50-70%. There was normal anterior perfusion on nuclear stress  test in 2016. He is here today for followup and is doing well.  He denies any chest pain or pressure, SOB, DOE, PND, orthopnea, LE edema, dizziness, palpitations or syncope. He is compliant with his meds and is tolerating meds with no SE.    The patient does not have symptoms concerning for COVID-19 infection (fever, chills, cough, or new shortness of breath).    Prior CV studies:   The following studies were reviewed today:  none  Past Medical History:  Diagnosis Date  . ALLERGIC RHINITIS 01/30/2007  . Arthritis    "hands" (05/29/2014)  . Coronary artery disease    LHC (05/29/14): pLAD 50-70, pRCA 99 (L>R collats), EF 55% >> PCI: 3.5 x 28 mm Xience Alpine DES to [employer]  . Cough secondary to angiotensin converting enzyme inhibitor (ACE-I) 05/28/2013  . ED (erectile dysfunction)   . GERD 09/26/2007  . HYPERLIPIDEMIA 09/26/2007  . Hypertension   . PLANTAR FASCIITIS, BILATERAL 07/08/2009  . Unspecified hearing loss 06/16/2009   wears aides both ears   Past Surgical History:  Procedure Laterality Date  . CARDIAC CATHETERIZATION  05/29/2014   Procedure: CORONARY STENT INTERVENTION;  Surgeon: Sinclair Grooms, MD;  Location: [facility] CATH LAB;  Service: Cardiovascular;;  Prox RCA  . CORONARY ANGIOPLASTY WITH STENT PLACEMENT  05/29/2014   "1"  . KNEE ARTHROSCOPY Right ~ 2005  . LEFT HEART CATHETERIZATION WITH CORONARY ANGIOGRAM N/A 05/29/2014   Procedure: LEFT HEART CATHETERIZATION WITH CORONARY ANGIOGRAM;  Surgeon: Sinclair Grooms, MD;  Location: [facility] CATH LAB;  Service: Cardiovascular;  Laterality: N/A;     Current Meds  Medication Sig  . aspirin EC 81 MG tablet Take 81 mg by mouth daily.  . Cyanocobalamin (VITAMIN B-12 CR PO) Take by mouth daily.  Marland Kitchen ezetimibe (ZETIA) 10 MG tablet TAKE 1 TABLET BY MOUTH EVERY DAY  . hydrochlorothiazide (MICROZIDE) 12.5 MG capsule Take 1 capsule (12.5 mg total) by mouth daily.  Marland Kitchen losartan (COZAAR) 50 MG tablet Take 1 tablet (50 mg total) by mouth daily.  . metoprolol  tartrate (LOPRESSOR) 25 MG tablet TAKE 1 TABLET BY MOUTH TWICE DAILY  . Multiple Vitamin (MULTIVITAMIN WITH MINERALS) TABS tablet Take 1 tablet by mouth daily.  Marland Kitchen NITROSTAT 0.4 MG SL tablet PLACE 1 TABLET BY MOUTH UNDER TONGUE EVERY 5 MINUTES AS NEEDED FOR CHEST PAIN  . omeprazole (PRILOSEC) 40 MG capsule TAKE 1 CAPSULE(40 MG) BY MOUTH DAILY  . simvastatin (ZOCOR) 40 MG tablet TAKE 1 TABLET( 40 MG TOTAL) BY MOUTH DAILY AT 6 PM  . tamsulosin (FLOMAX) 0.4 MG CAPS capsule Take 0.4 mg by mouth daily.     Allergies:   Lisinopril   Social History   Tobacco Use  . Smoking status: Former Smoker    Types: Cigarettes    Last attempt to quit: 05/24/1978    Years since quitting: 40.3  . Smokeless tobacco: Never Used  . Tobacco comment: 05/29/2014 "smoked socially; nothing since 1980"  Substance Use Topics  . Alcohol use: Yes    Alcohol/week: 2.0 standard drinks    Types: 2 Shots of liquor per week    Comment: beer or two a week  . Drug use: No     Family Hx: The patient's family history includes Heart attack in his father and mother; Heart disease in his father and mother. There is no history of COPD or Hyperlipidemia.  ROS:   Please see the history of present illness.     All other systems reviewed and are negative.   Labs/Other Tests and Data Reviewed:    Recent Labs: 07/18/2018: BUN 16; Creatinine, Ser 1.12; Hemoglobin 15.0; Platelets 246.0; Potassium 4.2; Sodium 140   Recent Lipid Panel Lab Results  Component Value Date/Time   CHOL 125 07/18/2018 09:48 AM   CHOL 134 08/25/2017 09:30 AM   TRIG 115.0 07/18/2018 09:48 AM   HDL 39.90 07/18/2018 09:48 AM   HDL 41 08/25/2017 09:30 AM   CHOLHDL 3 07/18/2018 09:48 AM   LDLCALC 62 07/18/2018 09:48 AM   LDLCALC 68 08/25/2017 09:30 AM    Wt Readings from Last 3 Encounters:  09/18/18 210 lb (95.3 kg)  07/18/18 213 lb 1.6 oz (96.7 kg)  05/12/18 211 lb 2 oz (95.8 kg)     Objective:    Vital Signs:  BP 136/87   Pulse 67   Ht 5'  7.5" (1.715 m)   Wt 210 lb (95.3 kg)   BMI 32.41 kg/m    CONSTITUTIONAL:  Well nourished, well developed male in no acute distress.  EYES: anicteric MOUTH: oral mucosa is pink RESPIRATORY: Normal respiratory effort, symmetric expansion CARDIOVASCULAR: No peripheral edema SKIN: No rash, lesions or ulcers MUSCULOSKELETAL: no digital cyanosis NEURO: Cranial Nerves II-XII grossly intact, moves all extremities PSYCH: Intact judgement and insight.  A&O x 3, Mood/affect appropriate   ASSESSMENT & PLAN:    1.  ASCAD - cardiac cath done January 2016 showed subtotaled occluded native RCA with 99% proximal stenosis status post DES to the RCA.  He also had 50 to 70% LAD stenosis with normal  perfusion on nuclear stress test in 2016.  He has not had any anginal symptoms since I saw him last.  He will continue on aspirin 81mg  daily, Lopressor 25 mg twice daily and simvastatin 40 mg daily.  2.  Hypertension - he checked his blood pressure this morning and is well controlled.  He will continue on HCTZ 12.5 mg daily, losartan 50 mg daily and Lopressor 25 mg twice daily.  His last creatinine was stable at 1.12 on 07/18/2018. His potassium was 4.2.  3.  Hyperlipidemia -his LDL goal is less than 70.  His LDL was 62 on 07/18/2018.  He will continue on simvastatin 40 mg daily and Zetia 10 mg daily.  I will get a copy of his last ALT.  4.  COVID-19 Education:The signs and symptoms of COVID-19 were discussed with the patient and how to seek care for testing (follow up with PCP or arrange E-visit).  The importance of social distancing was discussed today.  5.  Diabetes type 2 -this is followed by his PCP.  His last hemoglobin A1c was 6.8 on 07/18/2018.  He is managing this with diet only right now  Patient Risk:   After full review of this patient's clinical status, I feel that they are at least moderate risk at this time.  Time:   Today, I have spent 15 minutes directly with the patient on video discussing  medical problems including CAD, hypertension and hyperlipidemia as well as healthy diet..  We also reviewed the symptoms of COVID 19 and the ways to protect against contracting the virus with telehealth technology.  I spent an additional 5 minutes reviewing patient's chart including lab results.  Medication Adjustments/Labs and Tests Ordered: Current medicines are reviewed at length with the patient today.  Concerns regarding medicines are outlined above.  Tests Ordered: No orders of the defined types were placed in this encounter.  Medication Changes: No orders of the defined types were placed in this encounter.   Disposition:  Follow up in 1 year(s)  Signed, Fransico Him, MD  09/18/2018 1:50 PM    [facility]

## 2018-10-14 ENCOUNTER — Other Ambulatory Visit: Payer: Self-pay | Admitting: Cardiology

## 2018-11-21 ENCOUNTER — Ambulatory Visit: Payer: Medicare Other | Admitting: Family Medicine

## 2018-11-28 ENCOUNTER — Ambulatory Visit (INDEPENDENT_AMBULATORY_CARE_PROVIDER_SITE_OTHER): Payer: Medicare Other | Admitting: Family Medicine

## 2018-11-28 ENCOUNTER — Other Ambulatory Visit: Payer: Self-pay

## 2018-11-28 ENCOUNTER — Encounter: Payer: Self-pay | Admitting: Family Medicine

## 2018-11-28 DIAGNOSIS — R1031 Right lower quadrant pain: Secondary | ICD-10-CM

## 2018-11-28 NOTE — Progress Notes (Signed)
Virtual Visit via Video Note  I connected with Carl Harrell on 11/28/18 at  4:00 PM EDT by a video enabled telemedicine application and verified that I am speaking with the correct person using two identifiers.  Location patient: home Location provider:work or home office Persons participating in the virtual visit: patient, provider  I discussed the limitations of evaluation and management by telemedicine and the availability of in person appointments. The patient expressed understanding and agreed to proceed.   HPI: Pt is a 73 yo male with pmh sig for prostates cancer s/p radiation, HLD, DM II seen for acute concern.  Pt seen by Dr. Maudie Mercury.  Pt states he is ok today, but was having R groin pain.  States it was a "minor pain, a burning".  Denies increased activity.  Activity make the feeling better.  Started 3 months ago, happens off and on.  Denies haring any clicks in hip, lumps or pain in testicles, fever, chills, hematuria, n/v, or constipation- has a BM BID.  Pt endorses dysuria, slow stream at baseline.  ROS: See pertinent positives and negatives per HPI.  Past Medical History:  Diagnosis Date  . ALLERGIC RHINITIS 01/30/2007  . Arthritis    "hands" (05/29/2014)  . Coronary artery disease    LHC (05/29/14): pLAD 50-70, pRCA 99 (L>R collats), EF 55% >> PCI: 3.5 x 28 mm Xience Alpine DES to [employer]  . Cough secondary to angiotensin converting enzyme inhibitor (ACE-I) 05/28/2013  . ED (erectile dysfunction)   . GERD 09/26/2007  . HYPERLIPIDEMIA 09/26/2007  . Hypertension   . PLANTAR FASCIITIS, BILATERAL 07/08/2009  . Unspecified hearing loss 06/16/2009   wears aides both ears    Past Surgical History:  Procedure Laterality Date  . CARDIAC CATHETERIZATION  05/29/2014   Procedure: CORONARY STENT INTERVENTION;  Surgeon: Sinclair Grooms, MD;  Location: [facility] CATH LAB;  Service: Cardiovascular;;  Prox RCA  . CORONARY ANGIOPLASTY WITH STENT PLACEMENT  05/29/2014   "1"  . KNEE ARTHROSCOPY Right ~ 2005   . LEFT HEART CATHETERIZATION WITH CORONARY ANGIOGRAM N/A 05/29/2014   Procedure: LEFT HEART CATHETERIZATION WITH CORONARY ANGIOGRAM;  Surgeon: Sinclair Grooms, MD;  Location: [facility] CATH LAB;  Service: Cardiovascular;  Laterality: N/A;    Family History  Problem Relation Age of Onset  . Heart attack Mother   . Heart disease Mother   . Heart attack Father   . Heart disease Father   . COPD Neg Hx        family hx  . Hyperlipidemia Neg Hx        family hx     Current Outpatient Medications:  .  aspirin EC 81 MG tablet, Take 81 mg by mouth daily., Disp: , Rfl:  .  Cyanocobalamin (VITAMIN B-12 CR PO), Take by mouth daily., Disp: , Rfl:  .  ezetimibe (ZETIA) 10 MG tablet, TAKE 1 TABLET BY MOUTH EVERY DAY, Disp: 90 tablet, Rfl: 3 .  hydrochlorothiazide (MICROZIDE) 12.5 MG capsule, Take 1 capsule (12.5 mg total) by mouth daily., Disp: 90 capsule, Rfl: 3 .  losartan (COZAAR) 50 MG tablet, Take 1 tablet (50 mg total) by mouth daily., Disp: 90 tablet, Rfl: 3 .  metoprolol tartrate (LOPRESSOR) 25 MG tablet, TAKE 1 TABLET BY MOUTH TWICE DAILY, Disp: 180 tablet, Rfl: 1 .  Multiple Vitamin (MULTIVITAMIN WITH MINERALS) TABS tablet, Take 1 tablet by mouth daily., Disp: , Rfl:  .  NITROSTAT 0.4 MG SL tablet, PLACE 1 TABLET BY MOUTH UNDER TONGUE  EVERY 5 MINUTES AS NEEDED FOR CHEST PAIN, Disp: 25 tablet, Rfl: 12 .  omeprazole (PRILOSEC) 40 MG capsule, TAKE 1 CAPSULE(40 MG) BY MOUTH DAILY, Disp: 90 capsule, Rfl: 1 .  simvastatin (ZOCOR) 40 MG tablet, TAKE 1 TABLET( 40 MG TOTAL) BY MOUTH DAILY AT 6 PM, Disp: 90 tablet, Rfl: 3 .  tamsulosin (FLOMAX) 0.4 MG CAPS capsule, Take 0.4 mg by mouth daily., Disp: , Rfl:   EXAM:  VITALS per patient if applicable:  GENERAL: alert, oriented, appears well and in no acute distress  HEENT: atraumatic, conjunctiva clear, no obvious abnormalities on inspection of external nose and ears  NECK: normal movements of the head and neck  LUNGS: on inspection no signs of  respiratory distress, breathing rate appears normal, no obvious gross SOB, gasping or wheezing  CV: no obvious cyanosis  MS: moves all visible extremities without noticeable abnormality  PSYCH/NEURO: pleasant and cooperative, no obvious depression or anxiety, speech and thought processing grossly intact  ASSESSMENT AND PLAN:  Discussed the following assessment and plan:  Right inguinal pain -intermittent, though resolved at this time -possible msk injury such as strain, renal calculi, hernia, etc -discussed obtaining UA to f/u infection -consider further eval and possible imaging for recurrence.  F/u prn.  Pt needs TOC visit with new provider.   I discussed the assessment and treatment plan with the patient. The patient was provided an opportunity to ask questions and all were answered. The patient agreed with the plan and demonstrated an understanding of the instructions.   The patient was advised to call back or seek an in-person evaluation if the symptoms worsen or if the condition fails to improve as anticipated.  [name], MD

## 2018-11-29 LAB — POC URINALSYSI DIPSTICK (AUTOMATED)
Blood, UA: NEGATIVE
Glucose, UA: NEGATIVE
Ketones, UA: NEGATIVE
Leukocytes, UA: NEGATIVE
Nitrite, UA: NEGATIVE
Protein, UA: POSITIVE — AB
Spec Grav, UA: 1.02 (ref 1.010–1.025)
Urobilinogen, UA: 0.2 E.U./dL
pH, UA: 6 (ref 5.0–8.0)

## 2018-11-29 NOTE — Addendum Note (Signed)
Addended by: Elmer Picker on: 11/29/2018 08:58 AM   Modules accepted: Orders

## 2018-12-04 ENCOUNTER — Ambulatory Visit (INDEPENDENT_AMBULATORY_CARE_PROVIDER_SITE_OTHER): Payer: Medicare Other | Admitting: Family Medicine

## 2018-12-04 ENCOUNTER — Other Ambulatory Visit: Payer: Medicare Other

## 2018-12-04 ENCOUNTER — Other Ambulatory Visit: Payer: Self-pay

## 2018-12-04 ENCOUNTER — Encounter: Payer: Self-pay | Admitting: Family Medicine

## 2018-12-04 DIAGNOSIS — I2510 Atherosclerotic heart disease of native coronary artery without angina pectoris: Secondary | ICD-10-CM | POA: Diagnosis not present

## 2018-12-04 DIAGNOSIS — C61 Malignant neoplasm of prostate: Secondary | ICD-10-CM | POA: Diagnosis not present

## 2018-12-04 DIAGNOSIS — E118 Type 2 diabetes mellitus with unspecified complications: Secondary | ICD-10-CM | POA: Diagnosis not present

## 2018-12-04 DIAGNOSIS — I10 Essential (primary) hypertension: Secondary | ICD-10-CM | POA: Diagnosis not present

## 2018-12-04 DIAGNOSIS — M1990 Unspecified osteoarthritis, unspecified site: Secondary | ICD-10-CM | POA: Diagnosis not present

## 2018-12-04 NOTE — Progress Notes (Signed)
Virtual Visit via Video Note  I connected with Carl Harrell on 12/04/18 at  8:30 AM EDT by a video enabled telemedicine application and verified that I am speaking with the correct person using two identifiers.  Location patient: home Location provider:work or home office Persons participating in the virtual visit: patient, provider  I discussed the limitations of evaluation and management by telemedicine and the availability of in person appointments. The patient expressed understanding and agreed to proceed.   HPI: Pt is a 73 yo male with pmh sig for HTN, CAD, PVD, arthritis, ED, HLD, GERD who was seen for f/u and TOC, previously seen by Drs. Sherren Mocha and Maudie Mercury.  HTN: -checks bp at home.  Hasn't checked it in a few days -states it is in "normal range" -taking meds: metoprolol, losartan, HCTZ  CAD/PVD: -followed by Cardiology, Dr. Fransico Him -walks for exercise -Eating some vegetables -not drinking any water  -taking ASA 81 mg  Prostates Cancer" -s/p radiation  -had f/u with Urology this yrs  DM II: -states "comes and goes" -states "I'm border line" x 6 yrs -not on meds. -drinks a lot of soda and tea.  Does not like water.  Arthritis: -in hands -has slight pain -denies edema  Allergies: lisinopril caused cough  Social Hx: Denies tobacco and drug use.  Endorses drinking 1-2 drinks.  ROS: See pertinent positives and negatives per HPI.  Past Medical History:  Diagnosis Date  . ALLERGIC RHINITIS 01/30/2007  . Arthritis    "hands" (05/29/2014)  . Coronary artery disease    LHC (05/29/14): pLAD 50-70, pRCA 99 (L>R collats), EF 55% >> PCI: 3.5 x 28 mm Xience Alpine DES to [employer]  . Cough secondary to angiotensin converting enzyme inhibitor (ACE-I) 05/28/2013  . ED (erectile dysfunction)   . GERD 09/26/2007  . HYPERLIPIDEMIA 09/26/2007  . Hypertension   . PLANTAR FASCIITIS, BILATERAL 07/08/2009  . Unspecified hearing loss 06/16/2009   wears aides both ears    Past Surgical  History:  Procedure Laterality Date  . CARDIAC CATHETERIZATION  05/29/2014   Procedure: CORONARY STENT INTERVENTION;  Surgeon: Sinclair Grooms, MD;  Location: [facility] CATH LAB;  Service: Cardiovascular;;  Prox RCA  . CORONARY ANGIOPLASTY WITH STENT PLACEMENT  05/29/2014   "1"  . KNEE ARTHROSCOPY Right ~ 2005  . LEFT HEART CATHETERIZATION WITH CORONARY ANGIOGRAM N/A 05/29/2014   Procedure: LEFT HEART CATHETERIZATION WITH CORONARY ANGIOGRAM;  Surgeon: Sinclair Grooms, MD;  Location: [facility] CATH LAB;  Service: Cardiovascular;  Laterality: N/A;    Family History  Problem Relation Age of Onset  . Heart attack Mother   . Heart disease Mother   . Heart attack Father   . Heart disease Father   . COPD Neg Hx        family hx  . Hyperlipidemia Neg Hx        family hx     Current Outpatient Medications:  .  aspirin EC 81 MG tablet, Take 81 mg by mouth daily., Disp: , Rfl:  .  Cyanocobalamin (VITAMIN B-12 CR PO), Take by mouth daily., Disp: , Rfl:  .  ezetimibe (ZETIA) 10 MG tablet, TAKE 1 TABLET BY MOUTH EVERY DAY, Disp: 90 tablet, Rfl: 3 .  hydrochlorothiazide (MICROZIDE) 12.5 MG capsule, Take 1 capsule (12.5 mg total) by mouth daily., Disp: 90 capsule, Rfl: 3 .  losartan (COZAAR) 50 MG tablet, Take 1 tablet (50 mg total) by mouth daily., Disp: 90 tablet, Rfl: 3 .  metoprolol tartrate (  LOPRESSOR) 25 MG tablet, TAKE 1 TABLET BY MOUTH TWICE DAILY, Disp: 180 tablet, Rfl: 1 .  Multiple Vitamin (MULTIVITAMIN WITH MINERALS) TABS tablet, Take 1 tablet by mouth daily., Disp: , Rfl:  .  NITROSTAT 0.4 MG SL tablet, PLACE 1 TABLET BY MOUTH UNDER TONGUE EVERY 5 MINUTES AS NEEDED FOR CHEST PAIN, Disp: 25 tablet, Rfl: 12 .  omeprazole (PRILOSEC) 40 MG capsule, TAKE 1 CAPSULE(40 MG) BY MOUTH DAILY, Disp: 90 capsule, Rfl: 1 .  simvastatin (ZOCOR) 40 MG tablet, TAKE 1 TABLET( 40 MG TOTAL) BY MOUTH DAILY AT 6 PM, Disp: 90 tablet, Rfl: 3 .  tamsulosin (FLOMAX) 0.4 MG CAPS capsule, Take 0.4 mg by mouth daily., Disp: ,  Rfl:   EXAM:  VITALS per patient if applicable: RR between 68-12 bpm  GENERAL: alert, oriented, appears well and in no acute distress  HEENT: atraumatic, conjunctiva clear, no obvious abnormalities on inspection of external nose and ears  NECK: normal movements of the head and neck  LUNGS: on inspection no signs of respiratory distress, breathing rate appears normal, no obvious gross SOB, gasping or wheezing  CV: no obvious cyanosis  MS: moves all visible extremities without noticeable abnormality  PSYCH/NEURO: pleasant and cooperative, no obvious depression or anxiety, speech and thought processing grossly intact  ASSESSMENT AND PLAN:  Discussed the following assessment and plan:  Essential hypertension -controlled -continue current meds: metoprolol 25 mg  BID , losartan 50 mg, HCTZ 12.5 mg -discussed lifestyle modifications  Prostate cancer ([place])  -s/p radiation -in remission -continue f/u with Urology  Controlled type 2 diabetes mellitus with complication, without long-term current use of insulin ([place])  -diet controlled -discussed the importance of diet and exercise -hgb A1c 6.8% on 07/18/18 -will recheck hgb A1C at next OFV. -eye exam encouraged.  Coronary artery disease involving native coronary artery of native heart without angina pectoris  -continue ASA 81 mg, zetia 10 mg, and zocor 40 mg -continue lifestyle modifications -continue f/u with Cardiology, Dr. Radford Pax  Arthritis  -supportive care prn -consider Tylenol, heat, massage  UA results from 11/28/18 OFV reviewed. Pt not currently having R groin pain.  F/u prn.  F/u prn in 3-4 months   I discussed the assessment and treatment plan with the patient. The patient was provided an opportunity to ask questions and all were answered. The patient agreed with the plan and demonstrated an understanding of the instructions.   The patient was advised to call back or seek an in-person evaluation if the symptoms  worsen or if the condition fails to improve as anticipated.   [name], MD

## 2019-01-27 ENCOUNTER — Other Ambulatory Visit: Payer: Self-pay | Admitting: Family Medicine

## 2019-01-30 ENCOUNTER — Other Ambulatory Visit: Payer: Self-pay | Admitting: Family Medicine

## 2019-01-30 ENCOUNTER — Telehealth (INDEPENDENT_AMBULATORY_CARE_PROVIDER_SITE_OTHER): Payer: Medicare Other | Admitting: Family Medicine

## 2019-01-30 ENCOUNTER — Other Ambulatory Visit: Payer: Self-pay

## 2019-01-30 DIAGNOSIS — R0982 Postnasal drip: Secondary | ICD-10-CM

## 2019-01-30 DIAGNOSIS — R05 Cough: Secondary | ICD-10-CM | POA: Diagnosis not present

## 2019-01-30 DIAGNOSIS — J302 Other seasonal allergic rhinitis: Secondary | ICD-10-CM | POA: Diagnosis not present

## 2019-01-30 DIAGNOSIS — R059 Cough, unspecified: Secondary | ICD-10-CM

## 2019-01-30 MED ORDER — FLUTICASONE PROPIONATE 50 MCG/ACT NA SUSP: 1.0000 | Freq: Every day | NASAL | 0 refills | Status: DC

## 2019-01-30 MED ORDER — BENZONATATE 100 MG PO CAPS: 100.0000 mg | ORAL_CAPSULE | Freq: Two times a day (BID) | ORAL | 0 refills | Status: DC | PRN

## 2019-01-30 MED ORDER — CETIRIZINE HCL 10 MG PO TABS: 10.0000 mg | ORAL_TABLET | Freq: Every day | ORAL | 11 refills | Status: DC

## 2019-01-30 NOTE — Progress Notes (Signed)
Virtual Visit via Video Note  I connected with Serita Sheller on 01/30/19 at  3:30 PM EDT by a video enabled telemedicine application 2/2 [id number] pandemic and verified that I am speaking with the correct person using two identifiers.  Location patient: home Location provider:work or home office Persons participating in the virtual visit: patient, provider  I discussed the limitations of evaluation and management by telemedicine and the availability of in person appointments. The patient expressed understanding and agreed to proceed.   HPI: Pt with cough related to allergies x 3 months. Taking diphenhydramine BID and vicks nyquil syrup.  States post nasal drainage causing cough.  Pt notes ribs hurt if he moves or coughs.  L side of rib cage hurst more.  Pt denies fever, chills, n/v, diarrhea, HA, sick contacts.   ROS: See pertinent positives and negatives per HPI.  Past Medical History:  Diagnosis Date  . ALLERGIC RHINITIS 01/30/2007  . Arthritis    "hands" (05/29/2014)  . Coronary artery disease    LHC (05/29/14): pLAD 50-70, pRCA 99 (L>R collats), EF 55% >> PCI: 3.5 x 28 mm Xience Alpine DES to [employer]  . Cough secondary to angiotensin converting enzyme inhibitor (ACE-I) 05/28/2013  . ED (erectile dysfunction)   . GERD 09/26/2007  . HYPERLIPIDEMIA 09/26/2007  . Hypertension   . PLANTAR FASCIITIS, BILATERAL 07/08/2009  . Unspecified hearing loss 06/16/2009   wears aides both ears    Past Surgical History:  Procedure Laterality Date  . CARDIAC CATHETERIZATION  05/29/2014   Procedure: CORONARY STENT INTERVENTION;  Surgeon: Sinclair Grooms, MD;  Location: [facility] CATH LAB;  Service: Cardiovascular;;  Prox RCA  . CORONARY ANGIOPLASTY WITH STENT PLACEMENT  05/29/2014   "1"  . KNEE ARTHROSCOPY Right ~ 2005  . LEFT HEART CATHETERIZATION WITH CORONARY ANGIOGRAM N/A 05/29/2014   Procedure: LEFT HEART CATHETERIZATION WITH CORONARY ANGIOGRAM;  Surgeon: Sinclair Grooms, MD;  Location: [facility] CATH LAB;  Service:  Cardiovascular;  Laterality: N/A;    Family History  Problem Relation Age of Onset  . Heart attack Mother   . Heart disease Mother   . Heart attack Father   . Heart disease Father   . COPD Neg Hx        family hx  . Hyperlipidemia Neg Hx        family hx     Current Outpatient Medications:  .  aspirin EC 81 MG tablet, Take 81 mg by mouth daily., Disp: , Rfl:  .  Cyanocobalamin (VITAMIN B-12 CR PO), Take by mouth daily., Disp: , Rfl:  .  ezetimibe (ZETIA) 10 MG tablet, TAKE 1 TABLET BY MOUTH EVERY DAY, Disp: 90 tablet, Rfl: 3 .  hydrochlorothiazide (MICROZIDE) 12.5 MG capsule, Take 1 capsule (12.5 mg total) by mouth daily., Disp: 90 capsule, Rfl: 3 .  losartan (COZAAR) 50 MG tablet, Take 1 tablet (50 mg total) by mouth daily., Disp: 90 tablet, Rfl: 3 .  metoprolol tartrate (LOPRESSOR) 25 MG tablet, TAKE 1 TABLET BY MOUTH TWICE DAILY, Disp: 180 tablet, Rfl: 1 .  Multiple Vitamin (MULTIVITAMIN WITH MINERALS) TABS tablet, Take 1 tablet by mouth daily., Disp: , Rfl:  .  NITROSTAT 0.4 MG SL tablet, PLACE 1 TABLET BY MOUTH UNDER TONGUE EVERY 5 MINUTES AS NEEDED FOR CHEST PAIN, Disp: 25 tablet, Rfl: 12 .  omeprazole (PRILOSEC) 40 MG capsule, TAKE 1 CAPSULE(40 MG) BY MOUTH DAILY, Disp: 90 capsule, Rfl: 1 .  simvastatin (ZOCOR) 40 MG tablet, TAKE 1 TABLET(40 MG)  BY MOUTH DAILY AT 6 PM, Disp: 90 tablet, Rfl: 3 .  tamsulosin (FLOMAX) 0.4 MG CAPS capsule, Take 0.4 mg by mouth daily., Disp: , Rfl:   EXAM:  VITALS per patient if applicable:  GENERAL: alert, oriented, appears well and in no acute distress  HEENT: atraumatic, conjunctiva clear, no obvious abnormalities on inspection of external nose and ears  NECK: normal movements of the head and neck  LUNGS: on inspection no signs of respiratory distress, breathing rate appears normal, no obvious gross SOB, gasping or wheezing  CV: no obvious cyanosis  MS: moves all visible extremities without noticeable abnormality  PSYCH/NEURO:  pleasant and cooperative, no obvious depression or anxiety, speech and thought processing grossly intact  ASSESSMENT AND PLAN:  Discussed the following assessment and plan:  Seasonal allergies -d/c diphenhydramine -will try zyrtec and nasal spray.  - Plan: fluticasone (FLONASE) 50 MCG/ACT nasal spray, cetirizine (ZYRTEC) 10 MG tablet  Post-nasal drainage  - Plan: fluticasone (FLONASE) 50 MCG/ACT nasal spray  Cough  -likely 2/2 post nasal drainage due to allergies.  Though concern for pneumonia.  Less likely related to viral process given duration of 3 months. - Plan: DG Chest 2 View, benzonatate (TESSALON) 100 MG capsule -given precautions  F/u prn in the next few days  I discussed the assessment and treatment plan with the patient. The patient was provided an opportunity to ask questions and all were answered. The patient agreed with the plan and demonstrated an understanding of the instructions.   The patient was advised to call back or seek an in-person evaluation if the symptoms worsen or if the condition fails to improve as anticipated.   [name], MD

## 2019-01-31 ENCOUNTER — Other Ambulatory Visit: Payer: Self-pay

## 2019-01-31 ENCOUNTER — Ambulatory Visit (INDEPENDENT_AMBULATORY_CARE_PROVIDER_SITE_OTHER)
Admission: RE | Admit: 2019-01-31 | Discharge: 2019-01-31 | Disposition: A | Discharge status: Home / Self Care | Payer: Medicare Other | Source: Ambulatory Visit | Attending: Family Medicine | Admitting: Family Medicine

## 2019-01-31 DIAGNOSIS — R059 Cough, unspecified: Secondary | ICD-10-CM

## 2019-01-31 DIAGNOSIS — R05 Cough: Secondary | ICD-10-CM | POA: Diagnosis not present

## 2019-02-01 ENCOUNTER — Telehealth: Payer: Self-pay | Admitting: *Deleted

## 2019-02-01 ENCOUNTER — Telehealth: Payer: Self-pay | Admitting: Family Medicine

## 2019-02-01 DIAGNOSIS — R911 Solitary pulmonary nodule: Secondary | ICD-10-CM

## 2019-02-01 NOTE — Telephone Encounter (Signed)
Copied from [place] [id number]. Topic: General - Other >> Feb 01, 2019  8:14 AM Keene Breath wrote: Reason for CRM: Radiology called to speak with a nurse to give a call report.  Tried the office but nurse was with patient.  Radiology would like a call back as soon as possible.  CB# [phone]  Clinic RN called [facility]. Per Opal Sidles  CXR done yesterday shows  Right upper lobe mass lesions. Supicious for primary pulmonary neoplasm. Contrast enhanced CT of the chest is reocommended to further evaluate.  CT of chest with contrast ordered and note forwarded  to Dr. Volanda Napoleon.

## 2019-02-01 NOTE — Telephone Encounter (Signed)
Pt calling back.  Tried office 3x, no answer.

## 2019-02-01 NOTE — Telephone Encounter (Signed)
See phone note.  Pt called, however unable to give results.  Pt states will call office back.

## 2019-02-01 NOTE — Telephone Encounter (Signed)
Clinic RN called patient. Notified patient of results. Patient verbalized understanding.

## 2019-02-01 NOTE — Telephone Encounter (Signed)
Pt called in regards to CXR results, however he was driving and in the car with a group of people.  Provider inquired about a time pt could be called back.  Pt stated he would call the office back when he got a chance.  Given the above, pt was not made aware of his results.  [name], MD

## 2019-02-05 ENCOUNTER — Other Ambulatory Visit: Payer: Self-pay | Admitting: Family Medicine

## 2019-02-07 ENCOUNTER — Ambulatory Visit
Admission: RE | Admit: 2019-02-07 | Discharge: 2019-02-07 | Disposition: A | Discharge status: Home / Self Care | Payer: Medicare Other | Source: Ambulatory Visit | Attending: Family Medicine | Admitting: Family Medicine

## 2019-02-07 ENCOUNTER — Other Ambulatory Visit: Payer: Self-pay | Admitting: Family Medicine

## 2019-02-07 DIAGNOSIS — R918 Other nonspecific abnormal finding of lung field: Secondary | ICD-10-CM

## 2019-02-07 DIAGNOSIS — R911 Solitary pulmonary nodule: Secondary | ICD-10-CM

## 2019-02-07 DIAGNOSIS — C3411 Malignant neoplasm of upper lobe, right bronchus or lung: Secondary | ICD-10-CM | POA: Diagnosis not present

## 2019-02-07 MED ORDER — IOPAMIDOL (ISOVUE-300) INJECTION 61%
75.0000 mL | Freq: Once | INTRAVENOUS | Status: AC | PRN
  Administered 2019-02-07: 15:00:00 75 mL via INTRAVENOUS

## 2019-02-08 ENCOUNTER — Telehealth: Payer: Self-pay | Admitting: Internal Medicine

## 2019-02-08 ENCOUNTER — Encounter: Payer: Self-pay | Admitting: *Deleted

## 2019-02-08 NOTE — Telephone Encounter (Signed)
Received a new patient referral from Dr. [name] for mass of upper lobe of right lung. Mr. Surgeon has been cld and scheduled to see Dr. Julien Nordmann on 9/22 at 215pm w/labs at 145pm. Pt has been made aware to arrive 15 minutes early.

## 2019-02-08 NOTE — Telephone Encounter (Signed)
Pt has been coughing for 3 months. Pt had  a Chest x-ray done with area of concern. Chest CT was ordered and Dr Volanda Napoleon called pt about his CT results, pt was advised that a referral to Oncology has been placed. Pt questions were answered to satisfaction and was advised to call for further questions.

## 2019-02-08 NOTE — Progress Notes (Signed)
Oncology Nurse Navigator Documentation  Oncology Nurse Navigator Flowsheets 02/08/2019  Navigator Location [facility]  Referral Date to RadOnc/MedOnc 02/08/2019  Navigator Encounter Type Other: I received referral on Carl Harrell.  I updated new patient coordinator to call and schedule him to be seen next week with Dr. Julien Nordmann 02/13/19.    Abnormal Finding Date -  Confirmed Diagnosis Date -  Treatment Initiated Date -  Treatment Phase -  Barriers/Navigation Needs Coordination of Care  Education -  Interventions Coordination of Care  Coordination of Care Other  Education Method -  Support Groups/Services -  Acuity Level 2-Minimal Needs (1-2 Barriers Identified)  Acuity Level 2 -  Time Spent with Patient 30

## 2019-02-12 ENCOUNTER — Other Ambulatory Visit: Payer: Self-pay | Admitting: Physician Assistant

## 2019-02-12 DIAGNOSIS — R918 Other nonspecific abnormal finding of lung field: Secondary | ICD-10-CM

## 2019-02-13 ENCOUNTER — Emergency Department (HOSPITAL_COMMUNITY)
Admission: EM | Admit: 2019-02-13 | Discharge: 2019-02-13 | Disposition: A | Discharge status: Home / Self Care | Payer: Medicare Other | Attending: Emergency Medicine | Admitting: Emergency Medicine

## 2019-02-13 ENCOUNTER — Encounter: Payer: Self-pay | Admitting: Internal Medicine

## 2019-02-13 ENCOUNTER — Other Ambulatory Visit: Payer: Self-pay

## 2019-02-13 ENCOUNTER — Inpatient Hospital Stay: Payer: Medicare Other | Admitting: Internal Medicine

## 2019-02-13 ENCOUNTER — Inpatient Hospital Stay: Payer: Medicare Other | Attending: Internal Medicine

## 2019-02-13 DIAGNOSIS — Z803 Family history of malignant neoplasm of breast: Secondary | ICD-10-CM | POA: Insufficient documentation

## 2019-02-13 DIAGNOSIS — E785 Hyperlipidemia, unspecified: Secondary | ICD-10-CM

## 2019-02-13 DIAGNOSIS — E119 Type 2 diabetes mellitus without complications: Secondary | ICD-10-CM | POA: Diagnosis not present

## 2019-02-13 DIAGNOSIS — Z87891 Personal history of nicotine dependence: Secondary | ICD-10-CM | POA: Diagnosis not present

## 2019-02-13 DIAGNOSIS — I2510 Atherosclerotic heart disease of native coronary artery without angina pectoris: Secondary | ICD-10-CM | POA: Diagnosis not present

## 2019-02-13 DIAGNOSIS — I10 Essential (primary) hypertension: Secondary | ICD-10-CM | POA: Insufficient documentation

## 2019-02-13 DIAGNOSIS — E041 Nontoxic single thyroid nodule: Secondary | ICD-10-CM | POA: Diagnosis not present

## 2019-02-13 DIAGNOSIS — Z7982 Long term (current) use of aspirin: Secondary | ICD-10-CM | POA: Insufficient documentation

## 2019-02-13 DIAGNOSIS — Z79899 Other long term (current) drug therapy: Secondary | ICD-10-CM | POA: Diagnosis not present

## 2019-02-13 DIAGNOSIS — R911 Solitary pulmonary nodule: Secondary | ICD-10-CM

## 2019-02-13 DIAGNOSIS — R590 Localized enlarged lymph nodes: Secondary | ICD-10-CM | POA: Insufficient documentation

## 2019-02-13 DIAGNOSIS — D0220 Carcinoma in situ of unspecified bronchus and lung: Secondary | ICD-10-CM | POA: Insufficient documentation

## 2019-02-13 DIAGNOSIS — R55 Syncope and collapse: Secondary | ICD-10-CM | POA: Diagnosis not present

## 2019-02-13 DIAGNOSIS — C3490 Malignant neoplasm of unspecified part of unspecified bronchus or lung: Secondary | ICD-10-CM

## 2019-02-13 DIAGNOSIS — R918 Other nonspecific abnormal finding of lung field: Secondary | ICD-10-CM | POA: Insufficient documentation

## 2019-02-13 LAB — CBC WITH DIFFERENTIAL (CANCER CENTER ONLY)
Abs Immature Granulocytes: 0.03 10*3/uL (ref 0.00–0.07)
Basophils Absolute: 0 10*3/uL (ref 0.0–0.1)
Basophils Relative: 0 %
Eosinophils Absolute: 0.3 10*3/uL (ref 0.0–0.5)
Eosinophils Relative: 4 %
HCT: 44.9 % (ref 39.0–52.0)
Hemoglobin: 14.2 g/dL (ref 13.0–17.0)
Immature Granulocytes: 0 %
Lymphocytes Relative: 25 %
Lymphs Abs: 2.3 10*3/uL (ref 0.7–4.0)
MCH: 27.5 pg (ref 26.0–34.0)
MCHC: 31.6 g/dL (ref 30.0–36.0)
MCV: 86.8 fL (ref 80.0–100.0)
Monocytes Absolute: 0.9 10*3/uL (ref 0.1–1.0)
Monocytes Relative: 10 %
Neutro Abs: 5.6 10*3/uL (ref 1.7–7.7)
Neutrophils Relative %: 61 %
Platelet Count: 361 10*3/uL (ref 150–400)
RBC: 5.17 MIL/uL (ref 4.22–5.81)
RDW: 13.5 % (ref 11.5–15.5)
WBC Count: 9.1 10*3/uL (ref 4.0–10.5)
nRBC: 0 % (ref 0.0–0.2)

## 2019-02-13 LAB — CMP (CANCER CENTER ONLY)
ALT: 18 U/L (ref 0–44)
AST: 16 U/L (ref 15–41)
Albumin: 3.8 g/dL (ref 3.5–5.0)
Alkaline Phosphatase: 80 U/L (ref 38–126)
Anion gap: 8 (ref 5–15)
BUN: 10 mg/dL (ref 8–23)
CO2: 28 mmol/L (ref 22–32)
Calcium: 9.2 mg/dL (ref 8.9–10.3)
Chloride: 102 mmol/L (ref 98–111)
Creatinine: 1.15 mg/dL (ref 0.61–1.24)
GFR, Est AFR Am: >60 mL/min (ref >60)
GFR, Estimated: >60 mL/min (ref >60)
Glucose, Bld: 87 mg/dL (ref 70–99)
Potassium: 4.6 mmol/L (ref 3.5–5.1)
Sodium: 138 mmol/L (ref 135–145)
Total Bilirubin: 0.4 mg/dL (ref 0.3–1.2)
Total Protein: 7.5 g/dL (ref 6.5–8.1)

## 2019-02-13 NOTE — ED Triage Notes (Signed)
Patient was at cancer center, had a near syncopal episode, blood pressure increased, sweating.

## 2019-02-13 NOTE — Progress Notes (Signed)
Per MD Julien Nordmann pt went temporarily unconscious during MD visit today but continued breathing and was roused after a few seconds.  Pt placed in [place] in hallway and VS assessed.  128/106 BP, oxygen 99% on room air, and HR 54.  Pt diaphoretic, pale, dizzy & weak with staggering gait.  Pt started clutching his chest stating that it felt tight but denied radiating pain/numbness/tingling in jaw/chest/arms.  A&Ox4.  No unilat weakness or facial droop or slurred speech noted.  Pt repeating statements throughout assessment and appears lethargic and slow to answer questions, eyelids both drooping.  No fall or LOC observed.  Placed on 2L [place] oxygen.  Pt transported via w/c with belongings to [facility] room 8.  MD [facility] aware.  Report given bedside by RN Learta Codding & RN Stanton Kidney to Charge RN Erline Levine.  Pt states that he doesn't wish to have wife contacted at this time by staff and that he will contact her himself at a later time.

## 2019-02-13 NOTE — Progress Notes (Signed)
[place] [phone]   [phone]  CONSULT NOTE  REFERRING PHYSICIAN: Dr. [name]  REASON FOR CONSULTATION:  73 years old white male with suspicious lung cancer.  HPI Carl Harrell is a 73 y.o. male with past medical history significant for coronary artery disease status post stent placement, hypertension, dyslipidemia, GERD, allergic rhinitis, borderline diabetes mellitus as well as history of prostate cancer diagnosed in August 2018 status post radiotherapy under the care of Dr. Tammi Klippel.  The patient has remote history for smoking but quit 35 years ago.  He has been complaining of productive cough for around 5 months that was getting worse.  He was seen by his primary care physician Dr. Volanda Napoleon and chest x-ray was performed on 01/31/2019 and that showed a 4.3 x 4.0 cm soft tissue mass lesion located in the right upper lobe consistent with primary pulmonary neoplasm until proven otherwise.  There was also fullness in the right hilum.  This was followed by CT scan of the chest with contrast on 02/07/2019 and it showed spiculated mass of the anterior right upper lobe measuring 3.0 x 2.8 cm.  There is an additional lobulated right hilar mass measuring 3.3 x 3.2 cm which obstructs the right apical segment bronchus.  The scan also showed enlarged right hilar and pretracheal lymph nodes measuring up to 1.7 x 1.4 cm.  There is a rim calcified 2.5 cm hypodense nodule of the left lobe of the thyroid. The patient was referred to me today for further evaluation and recommendation regarding his condition.  When seen today he was feeling fine initially and complaining of persistent cough but no significant chest pain, shortness of breath or hemoptysis.  He lost around 8 pounds in the last to 3 weeks.  He denied having any nausea, vomiting, diarrhea or constipation.  He has no headache or visual changes. Family history significant for mother and father with heart  disease.  Mother had breast cancer at age 62. The patient is married and has 2 children a son who is 47 and daughter 37.  Used to work as Personal assistant.  He has a history for smoking 1 pack/day for around 20 years but quit 35 years ago.  He drinks alcohol occasionally and no history of drug abuse.  HPI  Past Medical History:  Diagnosis Date   ALLERGIC RHINITIS 01/30/2007   Arthritis    "hands" (05/29/2014)   Coronary artery disease    LHC (05/29/14): pLAD 50-70, pRCA 99 (L>R collats), EF 55% >> PCI: 3.5 x 28 mm Xience Alpine DES to pRCA   Cough secondary to angiotensin converting enzyme inhibitor (ACE-I) 05/28/2013   ED (erectile dysfunction)    GERD 09/26/2007   HYPERLIPIDEMIA 09/26/2007   Hypertension    PLANTAR FASCIITIS, BILATERAL 07/08/2009   Unspecified hearing loss 06/16/2009   wears aides both ears    Past Surgical History:  Procedure Laterality Date   CARDIAC CATHETERIZATION  05/29/2014   Procedure: CORONARY STENT INTERVENTION;  Surgeon: Sinclair Grooms, MD;  Location: [facility] CATH LAB;  Service: Cardiovascular;;  Prox RCA   CORONARY ANGIOPLASTY WITH STENT PLACEMENT  05/29/2014   "1"   KNEE ARTHROSCOPY Right ~ 2005   LEFT HEART CATHETERIZATION WITH CORONARY ANGIOGRAM N/A 05/29/2014   Procedure: LEFT HEART CATHETERIZATION WITH CORONARY ANGIOGRAM;  Surgeon: Sinclair Grooms, MD;  Location: [facility] CATH LAB;  Service: Cardiovascular;  Laterality: N/A;    Family History  Problem Relation Age of Onset  Heart attack Mother    Heart disease Mother    Heart attack Father    Heart disease Father    COPD Neg Hx        family hx   Hyperlipidemia Neg Hx        family hx    Social History Social History   Tobacco Use   Smoking status: Former Smoker    Types: Cigarettes    Quit date: 05/24/1978    Years since quitting: 40.7   Smokeless tobacco: Never Used   Tobacco comment: 05/29/2014 "smoked socially; nothing since 1980"  Substance Use Topics   Alcohol use: Yes     Alcohol/week: 2.0 standard drinks    Types: 2 Shots of liquor per week    Comment: beer or two a week   Drug use: No    Allergies  Allergen Reactions   Lisinopril Cough    Current Outpatient Medications  Medication Sig Dispense Refill   aspirin EC 81 MG tablet Take 81 mg by mouth daily.     benzonatate (TESSALON) 100 MG capsule Take 1 capsule (100 mg total) by mouth 2 (two) times daily as needed for cough. 20 capsule 0   cetirizine (ZYRTEC) 10 MG tablet Take 1 tablet (10 mg total) by mouth daily. 30 tablet 11   Cyanocobalamin (VITAMIN B-12 CR PO) Take by mouth daily.     ezetimibe (ZETIA) 10 MG tablet TAKE 1 TABLET BY MOUTH EVERY DAY 90 tablet 3   fluticasone (FLONASE) 50 MCG/ACT nasal spray SHAKE LIQUID AND USE 1 SPRAY IN EACH NOSTRIL DAILY 48 g 0   hydrochlorothiazide (MICROZIDE) 12.5 MG capsule Take 1 capsule (12.5 mg total) by mouth daily. 90 capsule 3   losartan (COZAAR) 50 MG tablet Take 1 tablet (50 mg total) by mouth daily. 90 tablet 3   losartan-hydrochlorothiazide (HYZAAR) 50-12.5 MG tablet      metoprolol tartrate (LOPRESSOR) 25 MG tablet TAKE 1 TABLET BY MOUTH  TWICE DAILY 180 tablet 1   Multiple Vitamin (MULTIVITAMIN WITH MINERALS) TABS tablet Take 1 tablet by mouth daily.     NITROSTAT 0.4 MG SL tablet PLACE 1 TABLET BY MOUTH UNDER TONGUE EVERY 5 MINUTES AS NEEDED FOR CHEST PAIN 25 tablet 12   omeprazole (PRILOSEC) 40 MG capsule TAKE 1 CAPSULE(40 MG) BY MOUTH DAILY 90 capsule 1   simvastatin (ZOCOR) 40 MG tablet TAKE 1 TABLET(40 MG) BY MOUTH DAILY AT 6 PM 90 tablet 3   tamsulosin (FLOMAX) 0.4 MG CAPS capsule Take 0.4 mg by mouth daily.     No current facility-administered medications for this visit.     Review of Systems  Constitutional: positive for fatigue and weight loss Eyes: negative Ears, nose, mouth, throat, and face: negative Respiratory: positive for cough Cardiovascular: negative Gastrointestinal:  negative Genitourinary:negative Integument/breast: negative Hematologic/lymphatic: negative Musculoskeletal:negative Neurological: negative Behavioral/Psych: negative Endocrine: negative Allergic/Immunologic: negative  Physical Exam  DXI:PJASN, healthy, no distress, well nourished, well developed and anxious SKIN: skin color, texture, turgor are normal, no rashes or significant lesions HEAD: Normocephalic, No masses, lesions, tenderness or abnormalities EYES: normal, PERRLA, Conjunctiva are pink and non-injected EARS: External ears normal, Canals clear OROPHARYNX:no exudate, no erythema and lips, buccal mucosa, and tongue normal  NECK: supple, no adenopathy, no JVD LYMPH:  no palpable lymphadenopathy, no hepatosplenomegaly LUNGS: clear to auscultation , and palpation HEART: regular rate & rhythm, no murmurs and no gallops ABDOMEN:abdomen soft, non-tender, normal bowel sounds and no masses or organomegaly BACK: Back symmetric, no curvature., No  CVA tenderness EXTREMITIES:no joint deformities, effusion, or inflammation, no edema  NEURO: alert & oriented x 3 with fluent speech, no focal motor/sensory deficits  PERFORMANCE STATUS: ECOG 1  LABORATORY DATA: Lab Results  Component Value Date   WBC 9.1 02/13/2019   HGB 14.2 02/13/2019   HCT 44.9 02/13/2019   MCV 86.8 02/13/2019   PLT 361 02/13/2019      Chemistry      Component Value Date/Time   NA 140 07/18/2018 0948   NA 141 08/05/2016 0826   K 4.2 07/18/2018 0948   CL 103 07/18/2018 0948   CO2 30 07/18/2018 0948   BUN 16 07/18/2018 0948   BUN 14 08/05/2016 0826   CREATININE 1.12 07/18/2018 0948      Component Value Date/Time   CALCIUM 9.0 07/18/2018 0948   ALKPHOS 59 08/25/2017 0930   AST 21 08/25/2017 0930   ALT 29 08/25/2017 0930   BILITOT 0.6 08/25/2017 0930       RADIOGRAPHIC STUDIES: Dg Chest 2 View  Result Date: 02/01/2019 CLINICAL DATA:  Productive cough for several months EXAM: CHEST - 2 VIEW  COMPARISON:  06/19/2013 FINDINGS: Cardiac shadows within normal limits. Mild aortic calcifications are seen. The lungs are well aerated bilaterally. In the right upper lobe there is a 4.3 x 4.0 cm soft tissue mass lesion consistent with primary pulmonary neoplasm till proven otherwise. Fullness in the right hilum is seen as well as some increased density posteriorly along the mid spine which is new from the prior exam portion of this density may be related to the descending aorta. IMPRESSION: Right upper lobe mass lesion suspicious for primary pulmonary neoplasm. Some fullness in the right hilum is noted as well. Contrast enhanced CT of the chest is recommended for further evaluation. These results will be called to the ordering clinician or representative by the Radiologist Assistant, and communication documented in the PACS or zVision Dashboard. Electronically Signed   By: Inez Catalina M.D.   On: 02/01/2019 08:02   Ct Chest W Contrast  Result Date: 02/07/2019 CLINICAL DATA:  Lung cancer, biopsy proven synchronous lesions EXAM: CT CHEST WITH CONTRAST TECHNIQUE: Multidetector CT imaging of the chest was performed during intravenous contrast administration. CONTRAST:  50mL ISOVUE-300 IOPAMIDOL (ISOVUE-300) INJECTION 61% COMPARISON:  Chest radiograph, 01/31/2019 FINDINGS: Cardiovascular: Aortic atherosclerosis. Normal heart size. Three-vessel coronary artery calcifications and/or stents. No pericardial effusion. Mediastinum/Nodes: Enlarged right hilar and pretracheal lymph nodes measuring up to 1.7 x 1.4 cm (series 2, image 59). There is a rim calcified 2.5 cm hypodense nodule of the left lobe of the thyroid (series 2, image 29). Exophytic nodule of the thyroid isthmus (series 2, image 36). Trachea, and esophagus demonstrate no significant findings. Lungs/Pleura: There is a spiculated mass of the anterior right upper lobe measuring 3.0 x 2.8 cm (series 8, image 43). There is an additional, lobulated right hilar  mass measuring 3.3 x 3.2 cm, which obstructs the right apical segment bronchus (series 2, image 60). No pleural effusion or pneumothorax. Upper Abdomen: No acute abnormality. Thickening of the bilateral adrenal glands without discrete mass or nodule (series 2, image 145). There is an ill-defined, somewhat wedge-shaped subcapsular lesion of the lateral right lobe superficial calcifications (series 2, image 134). Musculoskeletal: No chest wall mass or suspicious bone lesions identified. IMPRESSION: 1. There is a spiculated mass of the anterior right upper lobe measuring 3.0 x 2.8 cm (series 8, image 43). There is an additional, lobulated right hilar mass measuring 3.3 x 3.2 cm  which obstructs the right apical segment bronchus (series 2, image 60). Findings are consistent with primary lung malignancy. By report, there are known biopsy-proven synchronous lesions. 2. Enlarged right hilar and pretracheal lymph nodes measuring up to 1.7 x 1.4 cm (series 2, image 59), consistent with nodal metastatic disease. No definite evidence of distant metastatic disease. 3. Thickening of the bilateral adrenal glands without discrete mass or nodule (series 2, image 145). Attention on follow-up. 4. There is an ill-defined, somewhat wedge-shaped subcapsular lesion of the lateral right lobe superficial calcifications (series 2, image 134), this appearance favoring a non malignant etiology such as hemangioma or sequelae of prior trauma. 5.  Coronary artery disease.  Aortic Atherosclerosis (ICD10-I70.0). Electronically Signed   By: Eddie Candle M.D.   On: 02/07/2019 15:52    ASSESSMENT: This is a very pleasant 73 years old white male with history of prostate adenocarcinoma diagnosed in August 2018 status post curative radiotherapy under the care of Dr. Tammi Klippel. The patient presented today with highly suspicious stage III lung cancer likely non-small cell carcinoma presented with right upper lobe lung mass in addition to right hilar mass  and mediastinal lymphadenopathy, pending further staging work-up and tissue diagnosis.  PLAN: I had a lengthy discussion with the patient today about his current disease stage and further investigation to confirm his diagnosis.  I personally and independently reviewed the scan images and discussed the result and showed the images to the patient today.  I recommended for the patient to complete the staging work-up by ordering a PET scan as well as MRI of the brain to rule out any other metastatic disease. I will also refer the patient to Dr. Valeta Harms for consideration of bronchoscopy with endobronchial ultrasound and biopsy. During the discussion and showing the patient his scan results he start fainting and did not feel well.  He was shaken at time and we decided to take the patient to the emergency department for further evaluation. I will arrange for the patient to come back to the clinic in 2 weeks for evaluation and discussion of the scan and biopsy results as well as further recommendation regarding treatment of his condition. The patient was advised to call immediately if he has any other concerning symptoms in the interval. The patient voices understanding of current disease status and treatment options and is in agreement with the current care plan.  All questions were answered. The patient knows to call the clinic with any problems, questions or concerns. We can certainly see the patient much sooner if necessary.  Thank you so much for allowing me to participate in the care of Carl Harrell. I will continue to follow up the patient with you and assist in his care.  I spent 40 minutes counseling the patient face to face. The total time spent in the appointment was 60 minutes.  Disclaimer: This note was dictated with voice recognition software. Similar sounding words can inadvertently be transcribed and may not be corrected upon review.   Eilleen Kempf February 13, 2019, 2:07 PM

## 2019-02-13 NOTE — ED Notes (Signed)
Pt states that he is feeling better. Pt reports that he had just fnd out he had stage 3  Lung Cancer, and was not expecting to hear this information, and caused him to feel shocked and overwhelmed. Pt is alert and oriented x 4 and is verbally responsive.

## 2019-02-14 ENCOUNTER — Telehealth: Payer: Self-pay

## 2019-02-14 ENCOUNTER — Other Ambulatory Visit: Payer: Self-pay | Admitting: Internal Medicine

## 2019-02-14 ENCOUNTER — Telehealth: Payer: Self-pay | Admitting: Internal Medicine

## 2019-02-14 ENCOUNTER — Telehealth: Payer: Self-pay | Admitting: *Deleted

## 2019-02-14 ENCOUNTER — Encounter: Payer: Self-pay | Admitting: *Deleted

## 2019-02-14 DIAGNOSIS — R059 Cough, unspecified: Secondary | ICD-10-CM

## 2019-02-14 DIAGNOSIS — R05 Cough: Secondary | ICD-10-CM

## 2019-02-14 MED ORDER — BENZONATATE 100 MG PO CAPS: 100.0000 mg | ORAL_CAPSULE | Freq: Two times a day (BID) | ORAL | 0 refills | Status: DC | PRN

## 2019-02-14 NOTE — Telephone Encounter (Signed)
Call made to patient, while scheduling consult appt patient inquired as to whether Dr. Valeta Harms could prescribe him something for his cough. He also inquired as to whether I could de-brief him on his appointment with Dr. Julien Nordmann. I made him aware being that Dr. Valeta Harms has not seen him he would not be able to prescribe anything until he is at least evaluated in office. I made him aware I would get the message to Dr. Julien Nordmann and his nurse. Voiced understanding.   Will route message to [place].

## 2019-02-14 NOTE — ED Provider Notes (Signed)
[facility] DEPT Provider Note   CSN: [id number] Arrival date & time: 02/13/19  1503     History   Chief Complaint Chief Complaint  Patient presents with  . Near Syncope    HPI Carl Harrell is a 73 y.o. male.     73yo M w/ PMH including lung cancer, CAD, HTN, HLD who p/w near-syncope.  Patient went to the oncology clinic today and was told that he had stage III lung cancer.  While he was having the discussion, he began feeling overwhelmed and anxious about the news.  He states that he started feeling dizzy, sweaty, and then almost or briefly passed out.  He thinks that he recalls all events but almost passed out.  He denies any associated chest pain, shortness of breath, vomiting.  Currently he feels at his baseline.  He has a chronic mild cough that is unchanged recently.  He notes that he has had syncope in the distant past from blood draws and often when he gets blood drawn he feels dizzy, symptoms similar to what happened today.  He suspects that his episode was caused by the delivery of this bad news.  He denies any complaints currently.  The history is provided by the patient.  Near Syncope    Past Medical History:  Diagnosis Date  . ALLERGIC RHINITIS 01/30/2007  . Arthritis    "hands" (05/29/2014)  . Coronary artery disease    LHC (05/29/14): pLAD 50-70, pRCA 99 (L>R collats), EF 55% >> PCI: 3.5 x 28 mm Xience Alpine DES to [employer]  . Cough secondary to angiotensin converting enzyme inhibitor (ACE-I) 05/28/2013  . ED (erectile dysfunction)   . GERD 09/26/2007  . HYPERLIPIDEMIA 09/26/2007  . Hypertension   . PLANTAR FASCIITIS, BILATERAL 07/08/2009  . Unspecified hearing loss 06/16/2009   wears aides both ears    Patient Active Problem List   Diagnosis Date Noted  . Mass of upper lobe of right lung 02/13/2019  . Mediastinal lymphadenopathy 02/13/2019  . Hyperlipidemia associated with type 2 diabetes mellitus ([place]) 07/16/2018  . PAD (peripheral  artery disease) ([place]) 03/14/2018  . Hematochezia 02/20/2017  . Prostate cancer ([place]) 02/07/2017  . Controlled type 2 diabetes mellitus with complication, without long-term current use of insulin ([place]) 11/11/2016  . Hypertension associated with diabetes ([place]) 11/11/2016  . CAD (coronary artery disease) 06/20/2014  . UNSPECIFIED HEARING LOSS 06/16/2009  . GERD 09/26/2007  . Allergic rhinitis 01/30/2007    Past Surgical History:  Procedure Laterality Date  . CARDIAC CATHETERIZATION  05/29/2014   Procedure: CORONARY STENT INTERVENTION;  Surgeon: Sinclair Grooms, MD;  Location: [facility] CATH LAB;  Service: Cardiovascular;;  Prox RCA  . CORONARY ANGIOPLASTY WITH STENT PLACEMENT  05/29/2014   "1"  . KNEE ARTHROSCOPY Right ~ 2005  . LEFT HEART CATHETERIZATION WITH CORONARY ANGIOGRAM N/A 05/29/2014   Procedure: LEFT HEART CATHETERIZATION WITH CORONARY ANGIOGRAM;  Surgeon: Sinclair Grooms, MD;  Location: [facility] CATH LAB;  Service: Cardiovascular;  Laterality: N/A;        Home Medications    Prior to Admission medications   Medication Sig Start Date End Date Taking? Authorizing Provider  aspirin EC 81 MG tablet Take 81 mg by mouth daily.   Yes [provider]  benzonatate (TESSALON) 100 MG capsule Take 1 capsule (100 mg total) by mouth 2 (two) times daily as needed for cough. 01/30/19  Yes [name], MD  cetirizine (ZYRTEC) 10 MG tablet Take 1  tablet (10 mg total) by mouth daily. 01/30/19  Yes [name], MD  Cyanocobalamin (VITAMIN B-12 CR PO) Take by mouth daily.   Yes [provider]  ezetimibe (ZETIA) 10 MG tablet TAKE 1 TABLET BY MOUTH EVERY DAY Patient taking differently: Take 10 mg by mouth daily.  10/17/18  Yes Turner, Eber Hong, MD  fluticasone (FLONASE) 50 MCG/ACT nasal spray SHAKE LIQUID AND USE 1 SPRAY IN EACH NOSTRIL DAILY Patient taking differently: Place 1 spray into both nostrils daily.  01/31/19  Yes [name], MD  hydrochlorothiazide (MICROZIDE) 12.5 MG  capsule Take 1 capsule (12.5 mg total) by mouth daily. 09/05/18 09/05/19 Yes Turner, Eber Hong, MD  losartan (COZAAR) 50 MG tablet Take 1 tablet (50 mg total) by mouth daily. 09/05/18 09/05/19 Yes Turner, Eber Hong, MD  metoprolol tartrate (LOPRESSOR) 25 MG tablet TAKE 1 TABLET BY MOUTH  TWICE DAILY Patient taking differently: Take 25 mg by mouth 2 (two) times daily.  02/05/19  Yes [name], MD  Multiple Vitamin (MULTIVITAMIN WITH MINERALS) TABS tablet Take 1 tablet by mouth daily.   Yes [provider]  NITROSTAT 0.4 MG SL tablet PLACE 1 TABLET BY MOUTH UNDER TONGUE EVERY 5 MINUTES AS NEEDED FOR CHEST PAIN Patient taking differently: Place 0.4 mg under the tongue every 5 (five) minutes as needed for chest pain.  07/15/16  Yes Turner, Eber Hong, MD  omeprazole (PRILOSEC) 40 MG capsule TAKE 1 CAPSULE(40 MG) BY MOUTH DAILY Patient taking differently: Take 40 mg by mouth daily.  01/30/19  Yes [name], MD  simvastatin (ZOCOR) 40 MG tablet TAKE 1 TABLET(40 MG) BY MOUTH DAILY AT 6 PM Patient taking differently: Take 40 mg by mouth daily at 6 PM.  01/30/19  Yes [name], MD  tamsulosin (FLOMAX) 0.4 MG CAPS capsule Take 0.4 mg by mouth daily.   Yes [provider]    Family History Family History  Problem Relation Age of Onset  . Heart attack Mother   . Heart disease Mother   . Heart attack Father   . Heart disease Father   . COPD Neg Hx        family hx  . Hyperlipidemia Neg Hx        family hx    Social History Social History   Tobacco Use  . Smoking status: Former Smoker    Types: Cigarettes    Quit date: 05/24/1978    Years since quitting: 40.7  . Smokeless tobacco: Never Used  . Tobacco comment: 05/29/2014 "smoked socially; nothing since 1980"  Substance Use Topics  . Alcohol use: Yes    Alcohol/week: 2.0 standard drinks    Types: 2 Shots of liquor per week    Comment: beer or two a week  . Drug use: No     Allergies   Lisinopril   Review of  Systems Review of Systems  Cardiovascular: Positive for near-syncope.   All other systems reviewed and are negative except that which was mentioned in HPI   Physical Exam Updated Vital Signs BP 119/81 (BP Location: Right Arm)   Pulse 94   Temp 98.2 F (36.8 C) (Oral)   Resp 16   Ht 5' 7.52" (1.715 m)   Wt 92.7 kg   SpO2 100%   BMI 31.52 kg/m   Physical Exam Vitals signs and nursing note reviewed.  Constitutional:      General: He is not in acute distress.    Appearance: He  is well-developed.  HENT:     Head: Normocephalic and atraumatic.  Eyes:     Conjunctiva/sclera: Conjunctivae normal.     Pupils: Pupils are equal, round, and reactive to light.  Neck:     Musculoskeletal: Neck supple.  Cardiovascular:     Rate and Rhythm: Normal rate and regular rhythm.     Heart sounds: Normal heart sounds. No murmur.  Pulmonary:     Effort: Pulmonary effort is normal.     Breath sounds: Normal breath sounds.  Abdominal:     General: Bowel sounds are normal. There is no distension.     Palpations: Abdomen is soft.     Tenderness: There is no abdominal tenderness.  Skin:    General: Skin is warm and dry.  Neurological:     Mental Status: He is alert and oriented to person, place, and time.     Comments: Fluent speech  Psychiatric:        Judgment: Judgment normal.      ED Treatments / Results  Labs (all labs ordered are listed, but only abnormal results are displayed) Labs Reviewed - No data to display  EKG None  Radiology No results found.  Procedures Procedures (including critical care time)  Medications Ordered in ED Medications - No data to display  Orthostatic VS for the past 24 hrs:  BP- Lying Pulse- Lying BP- Sitting Pulse- Sitting BP- Standing at 0 minutes Pulse- Standing at 0 minutes  02/13/19 1806 133/85 85 114/85 91 127/70 90      Initial Impression / Assessment and Plan / ED Course  I have reviewed the triage vital signs and the nursing  notes.  Pertinent labs that were available during my care of the patient were reviewed by me and considered in my medical decision making (see chart for details).       Patient was well-appearing and comfortable on exam, reassuring vital signs.  I reviewed his lab work that was drawn earlier today from the clinic which was unremarkable.  Orthostatic vital signs reassuring.  He has been eating and drinking normally, no evidence of dehydration.  He denies complaints and given normal VS I doubt PE. His description of several near syncopal episodes during blood draws and similar episode today suggest vasovagal syncope/near syncope.  He denies any complaints now and feels comfortable going home with close follow-up with his outpatient provider.  He understands reasons to return to the ED. Final Clinical Impressions(s) / ED Diagnoses   Final diagnoses:  Vasovagal syncope    ED Discharge Orders    None       Tan Clopper, Wenda Overland, MD 02/14/19 [phone]

## 2019-02-14 NOTE — Progress Notes (Signed)
Oncology Nurse Navigator Documentation  Oncology Nurse Navigator Flowsheets 02/14/2019  Navigator Location [facility]  Referral Date to RadOnc/MedOnc -  Navigator Encounter Type Other/Carl Harrell is a new patient of Dr. Julien Nordmann.  Patient needs tisue dx with Dr. Valeta Harms.  Referral is completed and I updated Dr. Valeta Harms and his nurse about the referral.    Abnormal Finding Date -  Confirmed Diagnosis Date -  Treatment Initiated Date -  Treatment Phase -  Barriers/Navigation Needs Coordination of Care  Education -  Interventions Coordination of Care  Coordination of Care Other  Education Method -  Support Groups/Services -  Acuity Level 2-Minimal Needs (1-2 Barriers Identified)  Acuity Level 2 -  Time Spent with Patient 30

## 2019-02-14 NOTE — Telephone Encounter (Signed)
Scheduled appt per 9/22 los - pt aware of appt date and time

## 2019-02-14 NOTE — Telephone Encounter (Signed)
[place], please get into to next available slot with APP for Bronchoscopy pre-op with Icard.   I can do the case on Monday, Tues or Wednesday afternoon of next week.   DX: lung mass, mediastinal adenopathy Procedure: EBUS   Appt made for 02/15/2019 with NP Mack. Will route message as FYI.   Aaron Edelman just have nurse place order for amb ref to pulm with info provided above. Will cc Icard as well. Thanks.

## 2019-02-14 NOTE — Telephone Encounter (Signed)
Call made to patient, confirmed DOB, Consult appt made. Nothing further needed at this time.

## 2019-02-14 NOTE — Telephone Encounter (Signed)
lmovm for pt MD sent refill on Tesssalon to pts pharmacy

## 2019-02-15 ENCOUNTER — Encounter: Payer: Self-pay | Admitting: Pulmonary Disease

## 2019-02-15 ENCOUNTER — Telehealth: Payer: Self-pay

## 2019-02-15 ENCOUNTER — Other Ambulatory Visit: Payer: Self-pay

## 2019-02-15 ENCOUNTER — Ambulatory Visit: Payer: Medicare Other | Admitting: Pulmonary Disease

## 2019-02-15 VITALS — BP 122/78 | HR 72 | Ht 67.0 in | Wt 202.6 lb

## 2019-02-15 DIAGNOSIS — R590 Localized enlarged lymph nodes: Secondary | ICD-10-CM | POA: Diagnosis not present

## 2019-02-15 DIAGNOSIS — R918 Other nonspecific abnormal finding of lung field: Secondary | ICD-10-CM

## 2019-02-15 NOTE — Telephone Encounter (Signed)
Patient was seen in office.  Okay to proceed forward with the procedure as planned.  I will have patient hold daily baby aspirin.  Patient knows that we will contact him once we have more information regarding procedure getting scheduled.Order has been placed.There is anything else I can do to help coordinate care please let me know.Wyn Quaker, FNP

## 2019-02-15 NOTE — Progress Notes (Signed)
@Patient  ID: Carl Harrell, male    DOB: 15-Nov-1945, 73 y.o.   MRN: [id number]  Chief Complaint  Patient presents with   Follow-up    Pre-op for Bronch.-doing good    Referring provider: [name], MD  HPI:  73 year old male former smoker presenting to our office on 02/15/2019 for evaluation for potential EBUS for lung mass and mediastinal adenopathy  PMH: GERD, CAD, hypertension, type 2 diabetes Smoker/ Smoking History: Former smoker. Quit 1980. 3 pack years.  Maintenance: None Pt of: Dr. Valeta Harms  02/15/2019  - Visit   73 year old male former smoker presenting to our office today for evaluation of a lung mass seen on CT.  See those results listed below:  02/01/2019- chest x-ray-right upper lobe mass lesion suspicious for primary pulmonary neoplasm, some fullness in the right hilum is noted, a contrast-enhanced CT of chest is recommended for further evaluation  02/07/2019-CT chest with contrast- spiculated mass of the anterior right upper lobe measuring 3 x 2.8 cm, there is an additional lobulated right hilar mass measuring 3.3 x 3.2 cm which obstructs the right apical segment bronchus, findings are consistent with primary lung malignancy, enlarged right hilar and pretracheal lymph nodes measuring up to 1.7 x 1.4 cm consistent with nodular metastatic disease, thickening of bilateral adrenal glands without discrete mass or nodule, there is an ill-defined somewhat wedge-shaped subcapsular lesion of the lateral right lobe superficial calcifications this appearance favoring nonmalignant etiologies such as hemangioma or sequela of prior trauma  Patient is a former smoker but remote smoking history as he was mainly a social smoker.  A pack of cigarettes would last him a week.  Total pack-year history of 3 pack years.  He is already seeing Dr. Earlie Server with oncology and has a PET scan ordered.  He is presenting today to be evaluated for an EBUS procedure next week to obtain a biopsy of the  masses seen on his CT.  This procedure would be done by Dr. Valeta Harms.  Patient is on a baby aspirin.   Tests:   02/01/2019- chest x-ray-right upper lobe mass lesion suspicious for primary pulmonary neoplasm, some fullness in the right hilum is noted, a contrast-enhanced CT of chest is recommended for further evaluation  02/07/2019-CT chest with contrast- spiculated mass of the anterior right upper lobe measuring 3 x 2.8 cm, there is an additional lobulated right hilar mass measuring 3.3 x 3.2 cm which obstructs the right apical segment bronchus, findings are consistent with primary lung malignancy, enlarged right hilar and pretracheal lymph nodes measuring up to 1.7 x 1.4 cm consistent with nodular metastatic disease, thickening of bilateral adrenal glands without discrete mass or nodule, there is an ill-defined somewhat wedge-shaped subcapsular lesion of the lateral right lobe superficial calcifications this appearance favoring nonmalignant etiologies such as hemangioma or sequela of prior trauma  05/23/2014-2D echocardiogram-LV ejection fraction 60 to 35%, grade 1 diastolic dysfunction, right ventricle cavity size normal, systolic function was normal  FENO:  No results found for: NITRICOXIDE  PFT: No flowsheet data found.  WALK:  SIX MIN WALK 02/15/2019  Supplimental Oxygen during Test? (L/min) No  Tech Comments: Walked 2 laps at an average pace. No complaints.    Imaging: Dg Chest 2 View  Result Date: 02/01/2019 CLINICAL DATA:  Productive cough for several months EXAM: CHEST - 2 VIEW COMPARISON:  06/19/2013 FINDINGS: Cardiac shadows within normal limits. Mild aortic calcifications are seen. The lungs are well aerated bilaterally. In the right upper lobe there is a  4.3 x 4.0 cm soft tissue mass lesion consistent with primary pulmonary neoplasm till proven otherwise. Fullness in the right hilum is seen as well as some increased density posteriorly along the mid spine which is new from the  prior exam portion of this density may be related to the descending aorta. IMPRESSION: Right upper lobe mass lesion suspicious for primary pulmonary neoplasm. Some fullness in the right hilum is noted as well. Contrast enhanced CT of the chest is recommended for further evaluation. These results will be called to the ordering clinician or representative by the Radiologist Assistant, and communication documented in the PACS or zVision Dashboard. Electronically Signed   By: Inez Catalina M.D.   On: 02/01/2019 08:02   Ct Chest W Contrast  Result Date: 02/07/2019 CLINICAL DATA:  Lung cancer, biopsy proven synchronous lesions EXAM: CT CHEST WITH CONTRAST TECHNIQUE: Multidetector CT imaging of the chest was performed during intravenous contrast administration. CONTRAST:  29mL ISOVUE-300 IOPAMIDOL (ISOVUE-300) INJECTION 61% COMPARISON:  Chest radiograph, 01/31/2019 FINDINGS: Cardiovascular: Aortic atherosclerosis. Normal heart size. Three-vessel coronary artery calcifications and/or stents. No pericardial effusion. Mediastinum/Nodes: Enlarged right hilar and pretracheal lymph nodes measuring up to 1.7 x 1.4 cm (series 2, image 59). There is a rim calcified 2.5 cm hypodense nodule of the left lobe of the thyroid (series 2, image 29). Exophytic nodule of the thyroid isthmus (series 2, image 36). Trachea, and esophagus demonstrate no significant findings. Lungs/Pleura: There is a spiculated mass of the anterior right upper lobe measuring 3.0 x 2.8 cm (series 8, image 43). There is an additional, lobulated right hilar mass measuring 3.3 x 3.2 cm, which obstructs the right apical segment bronchus (series 2, image 60). No pleural effusion or pneumothorax. Upper Abdomen: No acute abnormality. Thickening of the bilateral adrenal glands without discrete mass or nodule (series 2, image 145). There is an ill-defined, somewhat wedge-shaped subcapsular lesion of the lateral right lobe superficial calcifications (series 2, image  134). Musculoskeletal: No chest wall mass or suspicious bone lesions identified. IMPRESSION: 1. There is a spiculated mass of the anterior right upper lobe measuring 3.0 x 2.8 cm (series 8, image 43). There is an additional, lobulated right hilar mass measuring 3.3 x 3.2 cm which obstructs the right apical segment bronchus (series 2, image 60). Findings are consistent with primary lung malignancy. By report, there are known biopsy-proven synchronous lesions. 2. Enlarged right hilar and pretracheal lymph nodes measuring up to 1.7 x 1.4 cm (series 2, image 59), consistent with nodal metastatic disease. No definite evidence of distant metastatic disease. 3. Thickening of the bilateral adrenal glands without discrete mass or nodule (series 2, image 145). Attention on follow-up. 4. There is an ill-defined, somewhat wedge-shaped subcapsular lesion of the lateral right lobe superficial calcifications (series 2, image 134), this appearance favoring a non malignant etiology such as hemangioma or sequelae of prior trauma. 5.  Coronary artery disease.  Aortic Atherosclerosis (ICD10-I70.0). Electronically Signed   By: Eddie Candle M.D.   On: 02/07/2019 15:52    Lab Results:  CBC    Component Value Date/Time   WBC 9.1 02/13/2019 1333   WBC 8.0 07/18/2018 0948   RBC 5.17 02/13/2019 1333   HGB 14.2 02/13/2019 1333   HCT 44.9 02/13/2019 1333   PLT 361 02/13/2019 1333   MCV 86.8 02/13/2019 1333   MCH 27.5 02/13/2019 1333   MCHC 31.6 02/13/2019 1333   RDW 13.5 02/13/2019 1333   LYMPHSABS 2.3 02/13/2019 1333   MONOABS 0.9 02/13/2019 1333  EOSABS 0.3 02/13/2019 1333   BASOSABS 0.0 02/13/2019 1333    BMET    Component Value Date/Time   NA 138 02/13/2019 1333   NA 141 08/05/2016 0826   K 4.6 02/13/2019 1333   CL 102 02/13/2019 1333   CO2 28 02/13/2019 1333   GLUCOSE 87 02/13/2019 1333   BUN 10 02/13/2019 1333   BUN 14 08/05/2016 0826   CREATININE 1.15 02/13/2019 1333   CALCIUM 9.2 02/13/2019 1333    GFRNONAA >60 02/13/2019 1333   GFRAA >60 02/13/2019 1333    BNP No results found for: BNP  ProBNP No results found for: PROBNP  Specialty Problems      Pulmonary Problems   Allergic rhinitis    Qualifier: Diagnosis of  By: Rogue Bussing CMA, Jacqualynn        Mass of upper lobe of right lung   Lung mass    02/07/2019-CT chest with contrast- spiculated mass of the anterior right upper lobe measuring 3 x 2.8 cm, there is an additional lobulated right hilar mass measuring 3.3 x 3.2 cm which obstructs the right apical segment bronchus, findings are consistent with primary lung malignancy, enlarged right hilar and pretracheal lymph nodes measuring up to 1.7 x 1.4 cm consistent with nodular metastatic disease, thickening of bilateral adrenal glands without discrete mass or nodule, there is an ill-defined somewhat wedge-shaped subcapsular lesion of the lateral right lobe superficial calcifications this appearance favoring nonmalignant etiologies such as hemangioma or sequela of prior trauma         Allergies  Allergen Reactions   Lisinopril Cough    Immunization History  Administered Date(s) Administered   Influenza Split 03/03/2011, 02/01/2012, 04/27/2013   Influenza Whole 05/24/2004   Influenza, High Dose Seasonal PF 01/16/2016   Influenza,inj,Quad PF,6+ Mos 02/22/2013, 03/13/2014   Influenza-Unspecified 03/07/2015, 02/19/2017, 03/08/2018   Pneumococcal Conjugate-13 03/21/2014   Pneumococcal Polysaccharide-23 10/03/2008   Td 05/24/2002   Tetanus 03/21/2014    Past Medical History:  Diagnosis Date   ALLERGIC RHINITIS 01/30/2007   Arthritis    "hands" (05/29/2014)   Coronary artery disease    LHC (05/29/14): pLAD 50-70, pRCA 99 (L>R collats), EF 55% >> PCI: 3.5 x 28 mm Xience Alpine DES to pRCA   Cough secondary to angiotensin converting enzyme inhibitor (ACE-I) 05/28/2013   ED (erectile dysfunction)    GERD 09/26/2007   HYPERLIPIDEMIA 09/26/2007   Hypertension     PLANTAR FASCIITIS, BILATERAL 07/08/2009   Unspecified hearing loss 06/16/2009   wears aides both ears    Tobacco History: Social History   Tobacco Use  Smoking Status Former Smoker   Packs/day: 0.20   Years: 15.00   Pack years: 3.00   Types: Cigarettes   Quit date: 05/24/1978   Years since quitting: 40.7  Smokeless Tobacco Never Used  Tobacco Comment   05/29/2014 "smoked socially; nothing since 1980"   Counseling given: Yes Comment: 05/29/2014 "smoked socially; nothing since 1980"  Continue to not smoke  Outpatient Encounter Medications as of 02/15/2019  Medication Sig   aspirin EC 81 MG tablet Take 81 mg by mouth daily.   benzonatate (TESSALON) 100 MG capsule Take 1 capsule (100 mg total) by mouth 2 (two) times daily as needed for cough.   cetirizine (ZYRTEC) 10 MG tablet Take 1 tablet (10 mg total) by mouth daily.   Cyanocobalamin (VITAMIN B-12 CR PO) Take by mouth daily.   ezetimibe (ZETIA) 10 MG tablet TAKE 1 TABLET BY MOUTH EVERY DAY (Patient taking differently: Take 10  mg by mouth daily. )   fluticasone (FLONASE) 50 MCG/ACT nasal spray SHAKE LIQUID AND USE 1 SPRAY IN EACH NOSTRIL DAILY (Patient taking differently: Place 1 spray into both nostrils daily. )   hydrochlorothiazide (MICROZIDE) 12.5 MG capsule Take 1 capsule (12.5 mg total) by mouth daily.   losartan (COZAAR) 50 MG tablet Take 1 tablet (50 mg total) by mouth daily.   metoprolol tartrate (LOPRESSOR) 25 MG tablet TAKE 1 TABLET BY MOUTH  TWICE DAILY (Patient taking differently: Take 25 mg by mouth 2 (two) times daily. )   Multiple Vitamin (MULTIVITAMIN WITH MINERALS) TABS tablet Take 1 tablet by mouth daily.   NITROSTAT 0.4 MG SL tablet PLACE 1 TABLET BY MOUTH UNDER TONGUE EVERY 5 MINUTES AS NEEDED FOR CHEST PAIN (Patient taking differently: Place 0.4 mg under the tongue every 5 (five) minutes as needed for chest pain. )   omeprazole (PRILOSEC) 40 MG capsule TAKE 1 CAPSULE(40 MG) BY MOUTH DAILY (Patient  taking differently: Take 40 mg by mouth daily. )   simvastatin (ZOCOR) 40 MG tablet TAKE 1 TABLET(40 MG) BY MOUTH DAILY AT 6 PM (Patient taking differently: Take 40 mg by mouth daily at 6 PM. )   tamsulosin (FLOMAX) 0.4 MG CAPS capsule Take 0.4 mg by mouth daily.   No facility-administered encounter medications on file as of 02/15/2019.      Review of Systems  Review of Systems  Constitutional: Negative for activity change, chills, fatigue, fever and unexpected weight change.  HENT: Negative for postnasal drip, rhinorrhea, sinus pressure, sinus pain and sore throat.   Eyes: Negative.   Respiratory: Negative for cough, shortness of breath and wheezing.   Cardiovascular: Negative for chest pain and palpitations.  Gastrointestinal: Negative for constipation, diarrhea, nausea and vomiting.  Endocrine: Negative.   Genitourinary: Negative.   Musculoskeletal: Negative.   Skin: Negative.   Neurological: Negative for dizziness and headaches.  Psychiatric/Behavioral: Negative for dysphoric mood. The patient is nervous/anxious.   All other systems reviewed and are negative.    Physical Exam  BP 122/78 (BP Location: Left Arm, Cuff Size: Normal)    Pulse 72    Ht 5\' 7"  (1.702 m)    Wt 202 lb 9.6 oz (91.9 kg)    SpO2 96%    BMI 31.73 kg/m   Wt Readings from Last 5 Encounters:  02/15/19 202 lb 9.6 oz (91.9 kg)  02/13/19 204 lb 6.4 oz (92.7 kg)  02/13/19 204 lb 6.4 oz (92.7 kg)  09/18/18 210 lb (95.3 kg)  07/18/18 213 lb 1.6 oz (96.7 kg)    BMI Readings from Last 5 Encounters:  02/15/19 31.73 kg/m  02/13/19 31.52 kg/m  02/13/19 31.54 kg/m  09/18/18 32.41 kg/m  07/18/18 32.88 kg/m     Physical Exam Vitals signs and nursing note reviewed.  Constitutional:      General: He is not in acute distress.    Appearance: Normal appearance. He is obese.  HENT:     Head: Normocephalic and atraumatic.     Right Ear: Hearing, tympanic membrane, ear canal and external ear normal.      Left Ear: Hearing, tympanic membrane, ear canal and external ear normal.     Nose: Nose normal. No mucosal edema, congestion or rhinorrhea.     Right Turbinates: Not enlarged.     Left Turbinates: Not enlarged.     Mouth/Throat:     Mouth: Mucous membranes are dry.     Pharynx: Oropharynx is clear. No oropharyngeal exudate.  Eyes:     Pupils: Pupils are equal, round, and reactive to light.  Neck:     Musculoskeletal: Normal range of motion.  Cardiovascular:     Rate and Rhythm: Normal rate and regular rhythm.     Pulses: Normal pulses.     Heart sounds: Normal heart sounds. No murmur.  Pulmonary:     Effort: Pulmonary effort is normal.     Breath sounds: Normal breath sounds. No decreased breath sounds, wheezing or rales.  Musculoskeletal:     Right lower leg: No edema.     Left lower leg: No edema.  Lymphadenopathy:     Cervical: No cervical adenopathy.  Skin:    General: Skin is warm and dry.     Capillary Refill: Capillary refill takes less than 2 seconds.     Findings: No erythema or rash.  Neurological:     General: No focal deficit present.     Mental Status: He is alert and oriented to person, place, and time.     Motor: No weakness.     Coordination: Coordination normal.     Gait: Gait is intact. Gait normal.  Psychiatric:        Attention and Perception: He is inattentive.        Mood and Affect: Mood normal.        Behavior: Behavior normal. Behavior is cooperative.        Thought Content: Thought content normal.        Judgment: Judgment normal.       Assessment & Plan:   Abnormal findings on diagnostic imaging of lung Plan: We will coordinate a EBUS procedure to be completed next week with Dr. Valeta Harms Continue forward with completing PET scan as planned by Dr. Julien Nordmann Hold daily baby aspirin until after completion of procedure next week  Lung mass Plan:  We will coordinate EBUS to be done next week   Mediastinal adenopathy Plan:  EBUS to be  completed next week     Return in about 2 weeks (around 03/01/2019), or if symptoms worsen or fail to improve, for Follow up with Wyn Quaker FNP-C, Follow up with Dr. Valeta Harms.   Lauraine Rinne, NP 02/15/2019   This appointment was 28 minutes long with over 50% of the time in direct face-to-face patient care, assessment, plan of care, and follow-up.

## 2019-02-15 NOTE — Assessment & Plan Note (Signed)
Plan:  We will coordinate EBUS to be done next week

## 2019-02-15 NOTE — Telephone Encounter (Signed)
I scheduled an EBUS for this patient for 9/29 @ 1300.  Pt will need a COVID Test scheduled for this procedure.  Will you please schedule this?  Thanks a bunch, [employer]

## 2019-02-15 NOTE — Patient Instructions (Addendum)
You were seen today by Lauraine Rinne, NP  for:   1. Lung mass  Walk today  - Ambulatory referral to Pulmonology for outpatient EBUS  We will coordinate getting you scheduled for an EBUS procedure with Dr. Valeta Harms next week.  This will either be on Monday Tuesday or Wednesday afternoon and either at [facility] or [facility] outpatient.  Please go ahead and start holding your daily baby aspirin  The day before the procedure, please have nothing by mouth after 12 AM  2. Mediastinal adenopathy  - Ambulatory referral to Pulmonology for outpatient EBUS  3. Abnormal findings on diagnostic imaging of lung  We will coordinate getting scheduled for an EBUS procedure with Dr. Valeta Harms next week   We recommend today:  Orders Placed This Encounter  Procedures  . Ambulatory referral to Pulmonology    Referral Priority:   Routine    Referral Type:   Consultation    Referral Reason:   Specialty Services Required    Requested Specialty:   Pulmonary Disease    Number of Visits Requested:   1   Orders Placed This Encounter  Procedures  . Ambulatory referral to Pulmonology   No orders of the defined types were placed in this encounter.   Follow Up:    Return in about 2 weeks (around 03/01/2019), or if symptoms worsen or fail to improve, for Follow up with Wyn Quaker FNP-C, Follow up with Dr. Valeta Harms.   Please do your part to reduce the spread of COVID-19:      Reduce your risk of any infection  and COVID19 by using the similar precautions used for avoiding the common cold or flu:  Marland Kitchen Wash your hands often with soap and warm water for at least 20 seconds.  If soap and water are not readily available, use an alcohol-based hand sanitizer with at least 60% alcohol.  . If coughing or sneezing, cover your mouth and nose by coughing or sneezing into the elbow areas of your shirt or coat, into a tissue or into your sleeve (not your hands). Langley Gauss A MASK when in public  . Avoid shaking hands with  others and consider head nods or verbal greetings only. . Avoid touching your eyes, nose, or mouth with unwashed hands.  . Avoid close contact with people who are sick. . Avoid places or events with large numbers of people in one location, like concerts or sporting events. . If you have some symptoms but not all symptoms, continue to monitor at home and seek medical attention if your symptoms worsen. . If you are having a medical emergency, call 911.   [place] / e-Visit: [email or web address]         [facility]: [facility]: [phone]                   [facility]: [phone]     It is flu season:   >>> Best ways to protect herself from the flu: Receive the yearly flu vaccine, practice good hand hygiene washing with soap and also using hand sanitizer when available, eat a nutritious meals, get adequate rest, hydrate appropriately   Please contact the office if your symptoms worsen or you have concerns that you are not improving.   Thank you for choosing [facility] for your healthcare, and for allowing [place] to partner with you on your healthcare journey. I  am thankful to be able to provide care to you today.   Wyn Quaker FNP-C

## 2019-02-15 NOTE — Assessment & Plan Note (Signed)
Plan: We will coordinate a EBUS procedure to be completed next week with Dr. Valeta Harms Continue forward with completing PET scan as planned by Dr. Julien Nordmann Hold daily baby aspirin until after completion of procedure next week

## 2019-02-15 NOTE — H&P (View-Only) (Signed)
@Patient  ID: Carl Harrell, male    DOB: [birth year], 73 y.o.   MRN: [id number]  Chief Complaint  Patient presents with   Follow-up    Pre-op for Bronch.-doing good    Referring provider: [name], MD  HPI:  73 year old male former smoker presenting to our office on 02/15/2019 for evaluation for potential EBUS for lung mass and mediastinal adenopathy  PMH: GERD, CAD, hypertension, type 2 diabetes Smoker/ Smoking History: Former smoker. Quit 1980. 3 pack years.  Maintenance: None Pt of: Dr. Valeta Harms  02/15/2019  - Visit   73 year old male former smoker presenting to our office today for evaluation of a lung mass seen on CT.  See those results listed below:  02/01/2019- chest x-ray-right upper lobe mass lesion suspicious for primary pulmonary neoplasm, some fullness in the right hilum is noted, a contrast-enhanced CT of chest is recommended for further evaluation  02/07/2019-CT chest with contrast- spiculated mass of the anterior right upper lobe measuring 3 x 2.8 cm, there is an additional lobulated right hilar mass measuring 3.3 x 3.2 cm which obstructs the right apical segment bronchus, findings are consistent with primary lung malignancy, enlarged right hilar and pretracheal lymph nodes measuring up to 1.7 x 1.4 cm consistent with nodular metastatic disease, thickening of bilateral adrenal glands without discrete mass or nodule, there is an ill-defined somewhat wedge-shaped subcapsular lesion of the lateral right lobe superficial calcifications this appearance favoring nonmalignant etiologies such as hemangioma or sequela of prior trauma  Patient is a former smoker but remote smoking history as he was mainly a social smoker.  A pack of cigarettes would last him a week.  Total pack-year history of 3 pack years.  He is already seeing Dr. Earlie Server with oncology and has a PET scan ordered.  He is presenting today to be evaluated for an EBUS procedure next week to obtain a biopsy of the  masses seen on his CT.  This procedure would be done by Dr. Valeta Harms.  Patient is on a baby aspirin.   Tests:   02/01/2019- chest x-ray-right upper lobe mass lesion suspicious for primary pulmonary neoplasm, some fullness in the right hilum is noted, a contrast-enhanced CT of chest is recommended for further evaluation  02/07/2019-CT chest with contrast- spiculated mass of the anterior right upper lobe measuring 3 x 2.8 cm, there is an additional lobulated right hilar mass measuring 3.3 x 3.2 cm which obstructs the right apical segment bronchus, findings are consistent with primary lung malignancy, enlarged right hilar and pretracheal lymph nodes measuring up to 1.7 x 1.4 cm consistent with nodular metastatic disease, thickening of bilateral adrenal glands without discrete mass or nodule, there is an ill-defined somewhat wedge-shaped subcapsular lesion of the lateral right lobe superficial calcifications this appearance favoring nonmalignant etiologies such as hemangioma or sequela of prior trauma  05/23/2014-2D echocardiogram-LV ejection fraction 60 to 32%, grade 1 diastolic dysfunction, right ventricle cavity size normal, systolic function was normal  FENO:  No results found for: NITRICOXIDE  PFT: No flowsheet data found.  WALK:  SIX MIN WALK 02/15/2019  Supplimental Oxygen during Test? (L/min) No  Tech Comments: Walked 2 laps at an average pace. No complaints.    Imaging: Dg Chest 2 View  Result Date: 02/01/2019 CLINICAL DATA:  Productive cough for several months EXAM: CHEST - 2 VIEW COMPARISON:  06/19/2013 FINDINGS: Cardiac shadows within normal limits. Mild aortic calcifications are seen. The lungs are well aerated bilaterally. In the right upper lobe there is a  4.3 x 4.0 cm soft tissue mass lesion consistent with primary pulmonary neoplasm till proven otherwise. Fullness in the right hilum is seen as well as some increased density posteriorly along the mid spine which is new from the  prior exam portion of this density may be related to the descending aorta. IMPRESSION: Right upper lobe mass lesion suspicious for primary pulmonary neoplasm. Some fullness in the right hilum is noted as well. Contrast enhanced CT of the chest is recommended for further evaluation. These results will be called to the ordering clinician or representative by the Radiologist Assistant, and communication documented in the PACS or zVision Dashboard. Electronically Signed   By: Inez Catalina M.D.   On: 02/01/2019 08:02   Ct Chest W Contrast  Result Date: 02/07/2019 CLINICAL DATA:  Lung cancer, biopsy proven synchronous lesions EXAM: CT CHEST WITH CONTRAST TECHNIQUE: Multidetector CT imaging of the chest was performed during intravenous contrast administration. CONTRAST:  51mL ISOVUE-300 IOPAMIDOL (ISOVUE-300) INJECTION 61% COMPARISON:  Chest radiograph, 01/31/2019 FINDINGS: Cardiovascular: Aortic atherosclerosis. Normal heart size. Three-vessel coronary artery calcifications and/or stents. No pericardial effusion. Mediastinum/Nodes: Enlarged right hilar and pretracheal lymph nodes measuring up to 1.7 x 1.4 cm (series 2, image 59). There is a rim calcified 2.5 cm hypodense nodule of the left lobe of the thyroid (series 2, image 29). Exophytic nodule of the thyroid isthmus (series 2, image 36). Trachea, and esophagus demonstrate no significant findings. Lungs/Pleura: There is a spiculated mass of the anterior right upper lobe measuring 3.0 x 2.8 cm (series 8, image 43). There is an additional, lobulated right hilar mass measuring 3.3 x 3.2 cm, which obstructs the right apical segment bronchus (series 2, image 60). No pleural effusion or pneumothorax. Upper Abdomen: No acute abnormality. Thickening of the bilateral adrenal glands without discrete mass or nodule (series 2, image 145). There is an ill-defined, somewhat wedge-shaped subcapsular lesion of the lateral right lobe superficial calcifications (series 2, image  134). Musculoskeletal: No chest wall mass or suspicious bone lesions identified. IMPRESSION: 1. There is a spiculated mass of the anterior right upper lobe measuring 3.0 x 2.8 cm (series 8, image 43). There is an additional, lobulated right hilar mass measuring 3.3 x 3.2 cm which obstructs the right apical segment bronchus (series 2, image 60). Findings are consistent with primary lung malignancy. By report, there are known biopsy-proven synchronous lesions. 2. Enlarged right hilar and pretracheal lymph nodes measuring up to 1.7 x 1.4 cm (series 2, image 59), consistent with nodal metastatic disease. No definite evidence of distant metastatic disease. 3. Thickening of the bilateral adrenal glands without discrete mass or nodule (series 2, image 145). Attention on follow-up. 4. There is an ill-defined, somewhat wedge-shaped subcapsular lesion of the lateral right lobe superficial calcifications (series 2, image 134), this appearance favoring a non malignant etiology such as hemangioma or sequelae of prior trauma. 5.  Coronary artery disease.  Aortic Atherosclerosis (ICD10-I70.0). Electronically Signed   By: Eddie Candle M.D.   On: 02/07/2019 15:52    Lab Results:  CBC    Component Value Date/Time   WBC 9.1 02/13/2019 1333   WBC 8.0 07/18/2018 0948   RBC 5.17 02/13/2019 1333   HGB 14.2 02/13/2019 1333   HCT 44.9 02/13/2019 1333   PLT 361 02/13/2019 1333   MCV 86.8 02/13/2019 1333   MCH 27.5 02/13/2019 1333   MCHC 31.6 02/13/2019 1333   RDW 13.5 02/13/2019 1333   LYMPHSABS 2.3 02/13/2019 1333   MONOABS 0.9 02/13/2019 1333  EOSABS 0.3 02/13/2019 1333   BASOSABS 0.0 02/13/2019 1333    BMET    Component Value Date/Time   NA 138 02/13/2019 1333   NA 141 08/05/2016 0826   K 4.6 02/13/2019 1333   CL 102 02/13/2019 1333   CO2 28 02/13/2019 1333   GLUCOSE 87 02/13/2019 1333   BUN 10 02/13/2019 1333   BUN 14 08/05/2016 0826   CREATININE 1.15 02/13/2019 1333   CALCIUM 9.2 02/13/2019 1333    GFRNONAA >60 02/13/2019 1333   GFRAA >60 02/13/2019 1333    BNP No results found for: BNP  ProBNP No results found for: PROBNP  Specialty Problems      Pulmonary Problems   Allergic rhinitis    Qualifier: Diagnosis of  By: Rogue Bussing CMA, Jacqualynn        Mass of upper lobe of right lung   Lung mass    02/07/2019-CT chest with contrast- spiculated mass of the anterior right upper lobe measuring 3 x 2.8 cm, there is an additional lobulated right hilar mass measuring 3.3 x 3.2 cm which obstructs the right apical segment bronchus, findings are consistent with primary lung malignancy, enlarged right hilar and pretracheal lymph nodes measuring up to 1.7 x 1.4 cm consistent with nodular metastatic disease, thickening of bilateral adrenal glands without discrete mass or nodule, there is an ill-defined somewhat wedge-shaped subcapsular lesion of the lateral right lobe superficial calcifications this appearance favoring nonmalignant etiologies such as hemangioma or sequela of prior trauma         Allergies  Allergen Reactions   Lisinopril Cough    Immunization History  Administered Date(s) Administered   Influenza Split 03/03/2011, 02/01/2012, 04/27/2013   Influenza Whole 05/24/2004   Influenza, High Dose Seasonal PF 01/16/2016   Influenza,inj,Quad PF,6+ Mos 02/22/2013, 03/13/2014   Influenza-Unspecified 03/07/2015, 02/19/2017, 03/08/2018   Pneumococcal Conjugate-13 03/21/2014   Pneumococcal Polysaccharide-23 10/03/2008   Td 05/24/2002   Tetanus 03/21/2014    Past Medical History:  Diagnosis Date   ALLERGIC RHINITIS 01/30/2007   Arthritis    "hands" (05/29/2014)   Coronary artery disease    LHC (05/29/14): pLAD 50-70, pRCA 99 (L>R collats), EF 55% >> PCI: 3.5 x 28 mm Xience Alpine DES to pRCA   Cough secondary to angiotensin converting enzyme inhibitor (ACE-I) 05/28/2013   ED (erectile dysfunction)    GERD 09/26/2007   HYPERLIPIDEMIA 09/26/2007   Hypertension     PLANTAR FASCIITIS, BILATERAL 07/08/2009   Unspecified hearing loss 06/16/2009   wears aides both ears    Tobacco History: Social History   Tobacco Use  Smoking Status Former Smoker   Packs/day: 0.20   Years: 15.00   Pack years: 3.00   Types: Cigarettes   Quit date: 05/24/1978   Years since quitting: 40.7  Smokeless Tobacco Never Used  Tobacco Comment   05/29/2014 "smoked socially; nothing since 1980"   Counseling given: Yes Comment: 05/29/2014 "smoked socially; nothing since 1980"  Continue to not smoke  Outpatient Encounter Medications as of 02/15/2019  Medication Sig   aspirin EC 81 MG tablet Take 81 mg by mouth daily.   benzonatate (TESSALON) 100 MG capsule Take 1 capsule (100 mg total) by mouth 2 (two) times daily as needed for cough.   cetirizine (ZYRTEC) 10 MG tablet Take 1 tablet (10 mg total) by mouth daily.   Cyanocobalamin (VITAMIN B-12 CR PO) Take by mouth daily.   ezetimibe (ZETIA) 10 MG tablet TAKE 1 TABLET BY MOUTH EVERY DAY (Patient taking differently: Take 10  mg by mouth daily. )   fluticasone (FLONASE) 50 MCG/ACT nasal spray SHAKE LIQUID AND USE 1 SPRAY IN EACH NOSTRIL DAILY (Patient taking differently: Place 1 spray into both nostrils daily. )   hydrochlorothiazide (MICROZIDE) 12.5 MG capsule Take 1 capsule (12.5 mg total) by mouth daily.   losartan (COZAAR) 50 MG tablet Take 1 tablet (50 mg total) by mouth daily.   metoprolol tartrate (LOPRESSOR) 25 MG tablet TAKE 1 TABLET BY MOUTH  TWICE DAILY (Patient taking differently: Take 25 mg by mouth 2 (two) times daily. )   Multiple Vitamin (MULTIVITAMIN WITH MINERALS) TABS tablet Take 1 tablet by mouth daily.   NITROSTAT 0.4 MG SL tablet PLACE 1 TABLET BY MOUTH UNDER TONGUE EVERY 5 MINUTES AS NEEDED FOR CHEST PAIN (Patient taking differently: Place 0.4 mg under the tongue every 5 (five) minutes as needed for chest pain. )   omeprazole (PRILOSEC) 40 MG capsule TAKE 1 CAPSULE(40 MG) BY MOUTH DAILY (Patient  taking differently: Take 40 mg by mouth daily. )   simvastatin (ZOCOR) 40 MG tablet TAKE 1 TABLET(40 MG) BY MOUTH DAILY AT 6 PM (Patient taking differently: Take 40 mg by mouth daily at 6 PM. )   tamsulosin (FLOMAX) 0.4 MG CAPS capsule Take 0.4 mg by mouth daily.   No facility-administered encounter medications on file as of 02/15/2019.      Review of Systems  Review of Systems  Constitutional: Negative for activity change, chills, fatigue, fever and unexpected weight change.  HENT: Negative for postnasal drip, rhinorrhea, sinus pressure, sinus pain and sore throat.   Eyes: Negative.   Respiratory: Negative for cough, shortness of breath and wheezing.   Cardiovascular: Negative for chest pain and palpitations.  Gastrointestinal: Negative for constipation, diarrhea, nausea and vomiting.  Endocrine: Negative.   Genitourinary: Negative.   Musculoskeletal: Negative.   Skin: Negative.   Neurological: Negative for dizziness and headaches.  Psychiatric/Behavioral: Negative for dysphoric mood. The patient is nervous/anxious.   All other systems reviewed and are negative.    Physical Exam  BP 122/78 (BP Location: Left Arm, Cuff Size: Normal)    Pulse 72    Ht 5\' 7"  (1.702 m)    Wt 202 lb 9.6 oz (91.9 kg)    SpO2 96%    BMI 31.73 kg/m   Wt Readings from Last 5 Encounters:  02/15/19 202 lb 9.6 oz (91.9 kg)  02/13/19 204 lb 6.4 oz (92.7 kg)  02/13/19 204 lb 6.4 oz (92.7 kg)  09/18/18 210 lb (95.3 kg)  07/18/18 213 lb 1.6 oz (96.7 kg)    BMI Readings from Last 5 Encounters:  02/15/19 31.73 kg/m  02/13/19 31.52 kg/m  02/13/19 31.54 kg/m  09/18/18 32.41 kg/m  07/18/18 32.88 kg/m     Physical Exam Vitals signs and nursing note reviewed.  Constitutional:      General: He is not in acute distress.    Appearance: Normal appearance. He is obese.  HENT:     Head: Normocephalic and atraumatic.     Right Ear: Hearing, tympanic membrane, ear canal and external ear normal.      Left Ear: Hearing, tympanic membrane, ear canal and external ear normal.     Nose: Nose normal. No mucosal edema, congestion or rhinorrhea.     Right Turbinates: Not enlarged.     Left Turbinates: Not enlarged.     Mouth/Throat:     Mouth: Mucous membranes are dry.     Pharynx: Oropharynx is clear. No oropharyngeal exudate.  Eyes:     Pupils: Pupils are equal, round, and reactive to light.  Neck:     Musculoskeletal: Normal range of motion.  Cardiovascular:     Rate and Rhythm: Normal rate and regular rhythm.     Pulses: Normal pulses.     Heart sounds: Normal heart sounds. No murmur.  Pulmonary:     Effort: Pulmonary effort is normal.     Breath sounds: Normal breath sounds. No decreased breath sounds, wheezing or rales.  Musculoskeletal:     Right lower leg: No edema.     Left lower leg: No edema.  Lymphadenopathy:     Cervical: No cervical adenopathy.  Skin:    General: Skin is warm and dry.     Capillary Refill: Capillary refill takes less than 2 seconds.     Findings: No erythema or rash.  Neurological:     General: No focal deficit present.     Mental Status: He is alert and oriented to person, place, and time.     Motor: No weakness.     Coordination: Coordination normal.     Gait: Gait is intact. Gait normal.  Psychiatric:        Attention and Perception: He is inattentive.        Mood and Affect: Mood normal.        Behavior: Behavior normal. Behavior is cooperative.        Thought Content: Thought content normal.        Judgment: Judgment normal.       Assessment & Plan:   Abnormal findings on diagnostic imaging of lung Plan: We will coordinate a EBUS procedure to be completed next week with Dr. Valeta Harms Continue forward with completing PET scan as planned by Dr. Julien Nordmann Hold daily baby aspirin until after completion of procedure next week  Lung mass Plan:  We will coordinate EBUS to be done next week   Mediastinal adenopathy Plan:  EBUS to be  completed next week     Return in about 2 weeks (around 03/01/2019), or if symptoms worsen or fail to improve, for Follow up with Wyn Quaker FNP-C, Follow up with Dr. Valeta Harms.   Lauraine Rinne, NP 02/15/2019   This appointment was 28 minutes long with over 50% of the time in direct face-to-face patient care, assessment, plan of care, and follow-up.

## 2019-02-15 NOTE — Assessment & Plan Note (Signed)
Plan:  EBUS to be completed next week

## 2019-02-16 ENCOUNTER — Telehealth: Payer: Self-pay

## 2019-02-16 ENCOUNTER — Other Ambulatory Visit (HOSPITAL_COMMUNITY)
Admission: RE | Admit: 2019-02-16 | Discharge: 2019-02-16 | Disposition: A | Discharge status: Home / Self Care | Payer: Medicare Other | Source: Ambulatory Visit | Attending: Pulmonary Disease | Admitting: Pulmonary Disease

## 2019-02-16 ENCOUNTER — Telehealth: Payer: Medicare Other | Admitting: Family Medicine

## 2019-02-16 DIAGNOSIS — Z20828 Contact with and (suspected) exposure to other viral communicable diseases: Secondary | ICD-10-CM | POA: Insufficient documentation

## 2019-02-16 DIAGNOSIS — Z01812 Encounter for preprocedural laboratory examination: Secondary | ICD-10-CM | POA: Diagnosis not present

## 2019-02-16 DIAGNOSIS — H2513 Age-related nuclear cataract, bilateral: Secondary | ICD-10-CM | POA: Diagnosis not present

## 2019-02-16 NOTE — Telephone Encounter (Signed)
Entered in error. Nothing further needed at this time.

## 2019-02-16 NOTE — Telephone Encounter (Signed)
Patient has had covid testing today. Closing encounter.

## 2019-02-17 ENCOUNTER — Telehealth: Payer: Self-pay | Admitting: Internal Medicine

## 2019-02-17 LAB — NOVEL CORONAVIRUS, NAA (HOSP ORDER, SEND-OUT TO REF LAB; TAT 18-24 HRS): SARS-CoV-2, NAA: NOT DETECTED

## 2019-02-17 NOTE — Telephone Encounter (Signed)
Call from [facility] patient but not see [facility] formally. Has upcoming diagnostic lung biopsy 02/20/2019/. COVID test yesterday - negative. Feeling ok - no fever., no sore throat. No loss of smell. No loss of taste.  Has new onset hemoptysis - 2 teaspoons lat 4-5  Hours and only if he works on coughing and tries to clear throat. No fever or other symptoms  A Hemoptysis likely due to lung mass  P - d/w Dr Valeta Harms - any worsening, recurrence repeatedly, >/= 1/4 cup cumulative volume or any concers -> go to [facility] ER for consideration of IR embolization  - try to suppress cough -  He has cough medicines at  Home  Will route message to Dr Beryl Meager    Dr. Brand Males, M.D., F.C.C.P,  Pulmonary and Critical Care Medicine Staff Physician, [facility] Director - Interstitial Lung Disease  Program  Pulmonary [place] [facility] [place], [place], [address]  Pager: [phone], If no answer or between  15:00h - 7:00h: call [phone] Telephone: [phone]  10:14 AM 02/17/2019

## 2019-02-19 ENCOUNTER — Other Ambulatory Visit: Payer: Self-pay

## 2019-02-19 ENCOUNTER — Encounter (HOSPITAL_COMMUNITY): Payer: Self-pay | Admitting: *Deleted

## 2019-02-19 NOTE — Progress Notes (Signed)
Anesthesia Chart Review: SAME DAY WORK-UP   Case: [id number] Date/Time: 02/20/19 1245   Procedure: VIDEO BRONCHOSCOPY WITH ENDOBRONCHIAL ULTRASOUND (N/A )   Anesthesia type: General   Pre-op diagnosis: LUNG MASS, MEDIASTINAL ADENOPATHY   Location: [facility] OR ROOM 30 / [facility]   Surgeon: Garner Nash, DO      DISCUSSION: Patient is a 73 year old male scheduled for the above procedure. Recently seen by PCP for cough and imaging showed a RUL mass consistent with primary pulmonary neoplasm until proven otherwise. Also had hemoptysis a few days ago (see note by Brand Males, MD). He was seen by Haze Rushing, MD recently, and he referred patient to Dr. Valeta Harms for biopsy in hopes to get definitive tissue diagnosis.  Other history includes former smoker (quit 1980), GERD, HLD, hearing loss, HTN, CAD (s/p DES RCA 05/29/14), ACEi cough (2015), suspected lung cancer, prostate cancer (s/p radiotherapy, 2018).   1 year follow-up recommended at his last cardiology visit with Dr. Radford Pax in April 2020.  Negative COVID-19 test 02/16/19.  He is a same-day work-up so further evaluation by his anesthesia team on the day of surgery.   PROVIDERS: [name], MD is PCP Fransico Him, MD is cardiologist. Last evaluation 09/18/18. Continued medical therapy recommended.     LABS: As of 02/13/19, labs include: Lab Results  Component Value Date   WBC 9.1 02/13/2019   HGB 14.2 02/13/2019   HCT 44.9 02/13/2019   PLT 361 02/13/2019   GLUCOSE 87 02/13/2019   CHOL 125 07/18/2018   TRIG 115.0 07/18/2018   HDL 39.90 07/18/2018   LDLCALC 62 07/18/2018   ALT 18 02/13/2019   AST 16 02/13/2019   NA 138 02/13/2019   K 4.6 02/13/2019   CL 102 02/13/2019   CREATININE 1.15 02/13/2019   BUN 10 02/13/2019   CO2 28 02/13/2019   HGBA1C 6.8 (H) 07/18/2018      IMAGES: CT Chest 02/07/19: IMPRESSION: 1. There is a spiculated mass of the anterior right upper lobe measuring 3.0 x 2.8 cm (series 8, image  43). There is an additional, lobulated right hilar mass measuring 3.3 x 3.2 cm which obstructs the right apical segment bronchus (series 2, image 60). Findings are consistent with primary lung malignancy. By report, there are known biopsy-proven synchronous lesions. 2. Enlarged right hilar and pretracheal lymph nodes measuring up to 1.7 x 1.4 cm (series 2, image 59), consistent with nodal metastatic disease. No definite evidence of distant metastatic disease. 3. Thickening of the bilateral adrenal glands without discrete mass or nodule (series 2, image 145). Attention on follow-up. 4. There is an ill-defined, somewhat wedge-shaped subcapsular lesion of the lateral right lobe superficial calcifications (series 2, image 134), this appearance favoring a non malignant etiology such as hemangioma or sequelae of prior trauma. 5.  Coronary artery disease.  Aortic Atherosclerosis (ICD10-I70.0).   EKG: 02/13/19: Sinus rhythm Incomplete RBBB and LAFB Borderline ST elevation, lateral leads No significant change since last tracing Confirmed by Merrily Pew [phone]) on 02/15/2019 1:26:36 AM   CV: Abdominal Aorta US 09/27/16: Impressions Normal caliber abdominal aorta, common and external iliac arteries, without focal stenosis, or dilatation.   Cardiac cath 05/29/14: ANGIOGRAPHIC DATA:    - The left main coronary artery is widely patent/normal. - The left anterior descending artery islarge and wraps around the left ventricular apex. He is large into the proximal first diagonal. There is eccentric segmental 50-70% proximal LAD stenosis. The tightest region of narrowing is near the origin of  the first septal perforator. The LAD is otherwise widely patent. Collaterals to the distal right coronary and noted filling from the septal perforators. - The left circumflex artery islarge, gives origin to 4 obtuse marginal branches with the second obtuse marginal being the largest of the 4. The second marginal  bifurcates on the lateral wall. Collaterals to the distal right coronary and noted filling from the circumflex.. - The right coronary artery issubtotally occluded proximally with a 99% stenosis and TIMI grade 2 flow.Marland Kitchen  PCI RESULTS:  The 99% proximal RCA subtotal occlusion was predilated and stented with a 3.5 mm x 28 mm long Zience Alpine drug-eluting stent with 0% residual stenosis and TIMI grade 3 flow.  LEFT VENTRICULOGRAM:  Left ventricular angiogram was done in the 30 RAO projection and rebasal hypokinesis involving the inferior wall. Estimated ejection fraction 55%.  IMPRESSIONS:   1. Subtotally occluded native right coronary with 99% proximal stenosis and TIMI grade 2 flow. The distal right coronary territory is supplied by left to right collaterals from both the LAD and circumflex. 2. Successful PTCA and stenting of the proximal RCA from 99% to 0% with TIMI grade 3 flow (final diameter 3.5 mm) 3. Intermediate stenosis involving the LAD with a proximal to mid segmental 50-70% stenosis. We did not treat this region or perform FFR given the normal anterior wall perfusion on nuclear scintigraphy. 4. Circumflex coronary artery is widely patent 5. Normal left ventricular function   Echo 05/23/14: Study Conclusions  - Left ventricle: The cavity size was normal. Wall thickness was  normal. Systolic function was normal. The estimated ejection  fraction was in the range of 60% to 65%. Wall motion was normal;  there were no regional wall motion abnormalities. Doppler  parameters are consistent with abnormal left ventricular  relaxation (grade 1 diastolic dysfunction).    Past Medical History:  Diagnosis Date  . ALLERGIC RHINITIS 01/30/2007  . Arthritis    "hands" (05/29/2014)  . Cancer ([place])    lung, prostate  . Constipation   . Coronary artery disease    LHC (05/29/14): pLAD 50-70, pRCA 99 (L>R collats), EF 55% >> PCI: 3.5 x 28 mm Xience Alpine DES to [employer]  . Cough secondary to  angiotensin converting enzyme inhibitor (ACE-I) 05/28/2013  . ED (erectile dysfunction)   . GERD 09/26/2007  . HYPERLIPIDEMIA 09/26/2007  . Hypertension   . PLANTAR FASCIITIS, BILATERAL 07/08/2009  . Unspecified hearing loss 06/16/2009   wears aides both ears    Past Surgical History:  Procedure Laterality Date  . CARDIAC CATHETERIZATION  05/29/2014   Procedure: CORONARY STENT INTERVENTION;  Surgeon: Sinclair Grooms, MD;  Location: [facility] CATH LAB;  Service: Cardiovascular;;  Prox RCA  . COLONOSCOPY    . CORONARY ANGIOPLASTY WITH STENT PLACEMENT  05/29/2014   "1"  . KNEE ARTHROSCOPY Right ~ 2005  . LEFT HEART CATHETERIZATION WITH CORONARY ANGIOGRAM N/A 05/29/2014   Procedure: LEFT HEART CATHETERIZATION WITH CORONARY ANGIOGRAM;  Surgeon: Sinclair Grooms, MD;  Location: [facility] CATH LAB;  Service: Cardiovascular;  Laterality: N/A;    MEDICATIONS: No current facility-administered medications for this encounter.    Marland Kitchen aspirin EC 81 MG tablet  . benzonatate (TESSALON) 100 MG capsule  . cetirizine (ZYRTEC) 10 MG tablet  . Cyanocobalamin (VITAMIN B-12 CR PO)  . ezetimibe (ZETIA) 10 MG tablet  . fluticasone (FLONASE) 50 MCG/ACT nasal spray  . hydrochlorothiazide (MICROZIDE) 12.5 MG capsule  . losartan (COZAAR) 50 MG tablet  . metoprolol tartrate (LOPRESSOR)  25 MG tablet  . Multiple Vitamin (MULTIVITAMIN WITH MINERALS) TABS tablet  . NITROSTAT 0.4 MG SL tablet  . omeprazole (PRILOSEC) 40 MG capsule  . simvastatin (ZOCOR) 40 MG tablet  . tamsulosin (FLOMAX) 0.4 MG CAPS capsule    Myra Gianotti, PA-C Surgical Short Stay/Anesthesiology [facility] Phone [phone] [facility] Phone [phone] 02/19/2019 4:50 PM

## 2019-02-19 NOTE — Progress Notes (Signed)
Spoke with pt for pre-op call. Pt has hx of CAD with stents, Dr. Fransico Him is his cardiologist. Pt denies any recent chest pain. Pt does have a nagging cough. States that the blood he was coughing up last week has stopped. Pt states he is not diabetic.   Pt had Covid test done on 02/16/19 and it is negative. States he' been in quarantine since the test was done with the exception that he went to an eye appt immediately after having the test done.   Pt voiced understanding of visitation policy.

## 2019-02-19 NOTE — Anesthesia Preprocedure Evaluation (Addendum)
Anesthesia Evaluation  Patient identified by MRN, date of birth, ID band Patient awake    Reviewed: Allergy & Precautions, NPO status , Patient's Chart, lab work & pertinent test results, reviewed documented beta blocker date and time   Airway Mallampati: II  TM Distance: >3 FB Neck ROM: Full    Dental  (+) Dental Advisory Given, Poor Dentition,    Pulmonary neg pulmonary ROS, former smoker,  RUL mass   Pulmonary exam normal        Cardiovascular hypertension, Pt. on home beta blockers and Pt. on medications + CAD, + Cardiac Stents and + Peripheral Vascular Disease  Normal cardiovascular exam  TTE 2015 EF 60-65%, valves ok  LHC 2016 IMPRESSIONS:1. Subtotally occluded native right coronary with 99% proximal stenosis and TIMI grade 2 flow. The distal right coronary territory is supplied by left to right collaterals from both the LAD and circumflex. 2. Successful PTCA and stenting of the proximal RCA from 99% to 0% with TIMI grade 3 flow (final diameter 3.5 mm) 3. Intermediate stenosis involving the LAD with a proximal to mid segmental 50-70% stenosis. We did not treat this region or perform FFR given the normal anterior wall perfusion on nuclear scintigraphy. 4. Circumflex coronary artery is widely patent 5. Normal left ventricular function   Neuro/Psych negative neurological ROS  negative psych ROS   GI/Hepatic Neg liver ROS, GERD  Medicated,  Endo/Other  diabetes, Type 2  Renal/GU negative Renal ROS  negative genitourinary   Musculoskeletal  (+) Arthritis ,   Abdominal Normal abdominal exam  (+)   Peds  Hematology negative hematology ROS (+)   Anesthesia Other Findings   Reproductive/Obstetrics                           Anesthesia Physical Anesthesia Plan  ASA: III  Anesthesia Plan: General   Post-op Pain Management:    Induction: Intravenous  PONV Risk Score and Plan: 2 and  Midazolam, Dexamethasone and Ondansetron  Airway Management Planned: Oral ETT  Additional Equipment: None  Intra-op Plan:   Post-operative Plan: Extubation in OR  Informed Consent: I have reviewed the patients History and Physical, chart, labs and discussed the procedure including the risks, benefits and alternatives for the proposed anesthesia with the patient or authorized representative who has indicated his/her understanding and acceptance.     Dental advisory given  Plan Discussed with: CRNA  Anesthesia Plan Comments: ( )      Anesthesia Quick Evaluation

## 2019-02-20 ENCOUNTER — Other Ambulatory Visit: Payer: Self-pay

## 2019-02-20 ENCOUNTER — Encounter (HOSPITAL_COMMUNITY)
Admission: RE | Disposition: A | Discharge status: Home / Self Care | Payer: Self-pay | Source: Home / Self Care | Attending: Pulmonary Disease

## 2019-02-20 ENCOUNTER — Ambulatory Visit (HOSPITAL_COMMUNITY): Payer: Medicare Other | Admitting: Vascular Surgery

## 2019-02-20 ENCOUNTER — Encounter (HOSPITAL_COMMUNITY): Payer: Self-pay

## 2019-02-20 ENCOUNTER — Ambulatory Visit (HOSPITAL_COMMUNITY)
Admission: RE | Admit: 2019-02-20 | Discharge: 2019-02-20 | Disposition: A | Discharge status: Home / Self Care | Payer: Medicare Other | Attending: Pulmonary Disease | Admitting: Pulmonary Disease

## 2019-02-20 DIAGNOSIS — E119 Type 2 diabetes mellitus without complications: Secondary | ICD-10-CM | POA: Diagnosis not present

## 2019-02-20 DIAGNOSIS — I2510 Atherosclerotic heart disease of native coronary artery without angina pectoris: Secondary | ICD-10-CM | POA: Diagnosis not present

## 2019-02-20 DIAGNOSIS — E785 Hyperlipidemia, unspecified: Secondary | ICD-10-CM | POA: Diagnosis not present

## 2019-02-20 DIAGNOSIS — K219 Gastro-esophageal reflux disease without esophagitis: Secondary | ICD-10-CM | POA: Diagnosis not present

## 2019-02-20 DIAGNOSIS — Z7982 Long term (current) use of aspirin: Secondary | ICD-10-CM | POA: Diagnosis not present

## 2019-02-20 DIAGNOSIS — Z87891 Personal history of nicotine dependence: Secondary | ICD-10-CM | POA: Diagnosis not present

## 2019-02-20 DIAGNOSIS — R590 Localized enlarged lymph nodes: Secondary | ICD-10-CM | POA: Diagnosis present

## 2019-02-20 DIAGNOSIS — Z79899 Other long term (current) drug therapy: Secondary | ICD-10-CM | POA: Diagnosis not present

## 2019-02-20 DIAGNOSIS — C969 Malignant neoplasm of lymphoid, hematopoietic and related tissue, unspecified: Secondary | ICD-10-CM | POA: Diagnosis not present

## 2019-02-20 DIAGNOSIS — C3411 Malignant neoplasm of upper lobe, right bronchus or lung: Secondary | ICD-10-CM | POA: Insufficient documentation

## 2019-02-20 DIAGNOSIS — I10 Essential (primary) hypertension: Secondary | ICD-10-CM | POA: Diagnosis not present

## 2019-02-20 DIAGNOSIS — R918 Other nonspecific abnormal finding of lung field: Secondary | ICD-10-CM | POA: Diagnosis not present

## 2019-02-20 DIAGNOSIS — R042 Hemoptysis: Secondary | ICD-10-CM | POA: Diagnosis not present

## 2019-02-20 DIAGNOSIS — M19041 Primary osteoarthritis, right hand: Secondary | ICD-10-CM | POA: Insufficient documentation

## 2019-02-20 DIAGNOSIS — C781 Secondary malignant neoplasm of mediastinum: Secondary | ICD-10-CM | POA: Diagnosis not present

## 2019-02-20 DIAGNOSIS — M19042 Primary osteoarthritis, left hand: Secondary | ICD-10-CM | POA: Insufficient documentation

## 2019-02-20 DIAGNOSIS — C771 Secondary and unspecified malignant neoplasm of intrathoracic lymph nodes: Secondary | ICD-10-CM | POA: Diagnosis not present

## 2019-02-20 DIAGNOSIS — I739 Peripheral vascular disease, unspecified: Secondary | ICD-10-CM | POA: Diagnosis not present

## 2019-02-20 HISTORY — DX: Constipation, unspecified: K59.00

## 2019-02-20 HISTORY — PX: VIDEO BRONCHOSCOPY WITH ENDOBRONCHIAL ULTRASOUND: SHX6177

## 2019-02-20 HISTORY — DX: Malignant (primary) neoplasm, unspecified: C80.1

## 2019-02-20 SURGERY — BRONCHOSCOPY, WITH EBUS
Anesthesia: General

## 2019-02-20 MED ORDER — EPINEPHRINE PF 1 MG/ML IJ SOLN
INTRAMUSCULAR | Status: DC | PRN
  Administered 2019-02-20: 1 mg

## 2019-02-20 MED ORDER — ONDANSETRON HCL 4 MG/2ML IJ SOLN
INTRAMUSCULAR | Status: DC | PRN
  Administered 2019-02-20: 4 mg via INTRAVENOUS

## 2019-02-20 MED ORDER — FENTANYL CITRATE (PF) 250 MCG/5ML IJ SOLN
INTRAMUSCULAR | Status: AC
  Filled 2019-02-20: qty 5

## 2019-02-20 MED ORDER — FENTANYL CITRATE (PF) 250 MCG/5ML IJ SOLN
INTRAMUSCULAR | Status: DC | PRN
  Administered 2019-02-20: 100 ug via INTRAVENOUS

## 2019-02-20 MED ORDER — FENTANYL CITRATE (PF) 100 MCG/2ML IJ SOLN: 25.0000 ug | INTRAMUSCULAR | Status: DC | PRN

## 2019-02-20 MED ORDER — SUGAMMADEX SODIUM 200 MG/2ML IV SOLN
INTRAVENOUS | Status: DC | PRN
  Administered 2019-02-20 (×2): 200 mg via INTRAVENOUS

## 2019-02-20 MED ORDER — DEXAMETHASONE SODIUM PHOSPHATE 10 MG/ML IJ SOLN
INTRAMUSCULAR | Status: AC
  Filled 2019-02-20: qty 1

## 2019-02-20 MED ORDER — SODIUM CHLORIDE 0.9 % IV SOLN
INTRAVENOUS | Status: DC | PRN
  Administered 2019-02-20: 16:00:00 10 ug/min via INTRAVENOUS

## 2019-02-20 MED ORDER — LACTATED RINGERS IV SOLN
INTRAVENOUS | Status: DC
  Administered 2019-02-20: 15:00:00 via INTRAVENOUS

## 2019-02-20 MED ORDER — EPINEPHRINE PF 1 MG/ML IJ SOLN
INTRAMUSCULAR | Status: AC
  Filled 2019-02-20: qty 1

## 2019-02-20 MED ORDER — DEXAMETHASONE SODIUM PHOSPHATE 10 MG/ML IJ SOLN
INTRAMUSCULAR | Status: DC | PRN
  Administered 2019-02-20: 10 mg via INTRAVENOUS

## 2019-02-20 MED ORDER — LIDOCAINE 2% (20 MG/ML) 5 ML SYRINGE
INTRAMUSCULAR | Status: DC | PRN
  Administered 2019-02-20: 60 mg via INTRAVENOUS

## 2019-02-20 MED ORDER — ONDANSETRON HCL 4 MG/2ML IJ SOLN
INTRAMUSCULAR | Status: AC
  Filled 2019-02-20: qty 2

## 2019-02-20 MED ORDER — 0.9 % SODIUM CHLORIDE (POUR BTL) OPTIME
TOPICAL | Status: DC | PRN
  Administered 2019-02-20: 16:00:00 1000 mL

## 2019-02-20 MED ORDER — ROCURONIUM BROMIDE 10 MG/ML (PF) SYRINGE
PREFILLED_SYRINGE | INTRAVENOUS | Status: AC
  Filled 2019-02-20: qty 10

## 2019-02-20 MED ORDER — PROPOFOL 10 MG/ML IV BOLUS
INTRAVENOUS | Status: DC | PRN
  Administered 2019-02-20: 130 mg via INTRAVENOUS

## 2019-02-20 MED ORDER — PROPOFOL 10 MG/ML IV BOLUS
INTRAVENOUS | Status: AC
  Filled 2019-02-20: qty 20

## 2019-02-20 MED ORDER — PHENYLEPHRINE 40 MCG/ML (10ML) SYRINGE FOR IV PUSH (FOR BLOOD PRESSURE SUPPORT)
PREFILLED_SYRINGE | INTRAVENOUS | Status: AC
  Filled 2019-02-20: qty 10

## 2019-02-20 MED ORDER — ROCURONIUM BROMIDE 10 MG/ML (PF) SYRINGE
PREFILLED_SYRINGE | INTRAVENOUS | Status: DC | PRN
  Administered 2019-02-20: 50 mg via INTRAVENOUS

## 2019-02-20 SURGICAL SUPPLY — 48 items
ADAPTER BRONCHOSCOPE OLYMP 190 (ADAPTER) ×3 IMPLANT
ADAPTER BRONCHOSCOPE OLYMPUS (ADAPTER) ×3 IMPLANT
ADAPTER VALVE BIOPSY EBUS (MISCELLANEOUS) IMPLANT
ADPTR VALVE BIOPSY EBUS (MISCELLANEOUS)
BRUSH CYTOL CELLEBRITY 1.5X140 (MISCELLANEOUS) ×6 IMPLANT
BRUSH SUPERTRAX BIOPSY (INSTRUMENTS) IMPLANT
BRUSH SUPERTRAX NDL-TIP CYTO (INSTRUMENTS) ×3 IMPLANT
CANISTER SUCT 3000ML PPV (MISCELLANEOUS) ×3 IMPLANT
CHANNEL WORK EXTEND EDGE 180 (KITS) IMPLANT
CHANNEL WORK EXTEND EDGE 90 (KITS) IMPLANT
CONT SPEC 4OZ CLIKSEAL STRL BL (MISCELLANEOUS) ×3 IMPLANT
COVER BACK TABLE 60X90IN (DRAPES) ×3 IMPLANT
COVER WAND RF STERILE (DRAPES) ×3 IMPLANT
FILTER STRAW FLUID ASPIR (MISCELLANEOUS) IMPLANT
FORCEPS BIOP RJ4 1.8 (CUTTING FORCEPS) IMPLANT
FORCEPS BIOP SUPERTRX PREMAR (INSTRUMENTS) ×3 IMPLANT
GAUZE SPONGE 4X4 12PLY STRL (GAUZE/BANDAGES/DRESSINGS) ×3 IMPLANT
GLOVE SURG SS PI 7.5 STRL IVOR (GLOVE) ×6 IMPLANT
GOWN STRL REUS W/ TWL LRG LVL3 (GOWN DISPOSABLE) ×2 IMPLANT
GOWN STRL REUS W/TWL LRG LVL3 (GOWN DISPOSABLE) ×4
KIT CLEAN ENDO COMPLIANCE (KITS) ×6 IMPLANT
KIT LOCATABLE GUIDE (CANNULA) IMPLANT
KIT MARKER FIDUCIAL DELIVERY (KITS) IMPLANT
KIT PROCEDURE EDGE 180 (KITS) IMPLANT
KIT PROCEDURE EDGE 90 (KITS) IMPLANT
KIT TURNOVER KIT B (KITS) ×3 IMPLANT
MARKER SKIN DUAL TIP RULER LAB (MISCELLANEOUS) ×3 IMPLANT
NEEDLE ASPIRATION VIZISHOT 19G (NEEDLE) IMPLANT
NEEDLE ASPIRATION VIZISHOT 21G (NEEDLE) ×3 IMPLANT
NEEDLE SUPERTRX PREMARK BIOPSY (NEEDLE) ×3 IMPLANT
NS IRRIG 1000ML POUR BTL (IV SOLUTION) ×3 IMPLANT
OIL SILICONE PENTAX (PARTS (SERVICE/REPAIRS)) ×3 IMPLANT
STOPCOCK 4 WAY LG BORE MALE ST (IV SETS) ×3 IMPLANT
SYR 20ML ECCENTRIC (SYRINGE) ×9 IMPLANT
SYR 20ML LL LF (SYRINGE) ×3 IMPLANT
SYR 3ML LL SCALE MARK (SYRINGE) IMPLANT
SYR 50ML SLIP (SYRINGE) ×3 IMPLANT
SYR 5ML LL (SYRINGE) ×6 IMPLANT
TOWEL GREEN STERILE FF (TOWEL DISPOSABLE) ×3 IMPLANT
TRAP SPECIMEN MUCOUS 40CC (MISCELLANEOUS) IMPLANT
TUBE CONNECTING 20'X1/4 (TUBING) ×1
TUBE CONNECTING 20X1/4 (TUBING) ×2 IMPLANT
TUBING EXTENTION W/L.L. (IV SETS) ×3 IMPLANT
UNDERPAD 30X30 (UNDERPADS AND DIAPERS) ×3 IMPLANT
VALVE BIOPSY  SINGLE USE (MISCELLANEOUS) ×2
VALVE BIOPSY SINGLE USE (MISCELLANEOUS) ×1 IMPLANT
VALVE SUCTION BRONCHIO DISP (MISCELLANEOUS) ×3 IMPLANT
WATER STERILE IRR 1000ML POUR (IV SOLUTION) ×3 IMPLANT

## 2019-02-20 NOTE — Op Note (Signed)
Video Bronchoscopy with Endobronchial Ultrasound Procedure Note  Date of Operation: 02/20/2019  Pre-op Diagnosis: Lung mass, adenopathy  Post-op Diagnosis: Lung mass, adenopathy  Surgeon: Dr. Valeta Harms, Dr. Tamala Julian  Assistants: NA  Anesthesia: General endotracheal anesthesia  Operation: Flexible video fiberoptic bronchoscopy with endobronchial ultrasound and biopsies.  Estimated Blood Loss: less than 50   Complications: None  Indications and History: Carl Harrell is a 73 y.o. male with history of smoking presenting with hemoptysis found to have lung mass.  The risks, benefits, complications, treatment options and expected outcomes were discussed with the patient.  The possibilities of pneumothorax, pneumonia, reaction to medication, pulmonary aspiration, perforation of a viscus, bleeding, failure to diagnose a condition and creating a complication requiring transfusion or operation were discussed with the patient who freely signed the consent.    Description of Procedure: The patient was examined in the preoperative area and history and data from the preprocedure consultation were reviewed. It was deemed appropriate to proceed.  The patient was taken to OR 15, identified as Carl Harrell and the procedure verified as Flexible Video Fiberoptic Bronchoscopy.  A Time Out was held and the above information confirmed. After being taken to the operating room general anesthesia was initiated and the patient  was orally intubated. The video fiberoptic bronchoscope was introduced via the endotracheal tube and a general inspection was performed which showed mass occluding RUL superior subsegment. The standard scope was then withdrawn and the endobronchial ultrasound was used to identify and characterize the peritracheal, hilar and bronchial lymph nodes. Inspection showed no significant subcarinal node but did show enlargement of right pretracheal node. Using real-time ultrasound guidance Wang needle  biopsies were take from Station 4R node and right upper lobe endobronchial mass and were sent for cytology. The patient tolerated the procedure well without apparent complications. There was no significant blood loss. The bronchoscope was withdrawn. Anesthesia was reversed and the patient was taken to the PACU for recovery.   Samples: 1. Wang needle biopsies from station 4R node 2. Wang needle biopsies from RUL endobronchial mass  Plans:  The patient will be discharged from the PACU to home when recovered from anesthesia. We will review the cytology, pathology and microbiology results with the patient when they become available. Outpatient followup will be with Dr. Valeta Harms and Dr. Earlie Server.    Dr. Tamala Julian PCCM

## 2019-02-20 NOTE — Discharge Instructions (Addendum)
Flexible Bronchoscopy, Care After This sheet gives you information about how to care for yourself after your test. Your doctor may also give you more specific instructions. If you have problems or questions, contact your doctor. Follow these instructions at home: Eating and drinking  The day after the test, go back to your normal diet. Driving  Do not drive for 24 hours if you were given a medicine to help you relax (sedative).  Do not drive or use heavy machinery while taking prescription pain medicine. General instructions   Take over-the-counter and prescription medicines only as told by your doctor.  Return to your normal activities as told. Ask what activities are safe for you.  Do not use any products that have nicotine or tobacco in them. This includes cigarettes and e-cigarettes. If you need help quitting, ask your doctor.  Keep all follow-up visits as told by your doctor. This is important. It is very important if you had a tissue sample (biopsy) taken. Get help right away if:  You have shortness of breath that gets worse.  You get light-headed.  You feel like you are going to pass out (faint).  You have chest pain.  You cough up: ? More than a little blood. ? More blood than before. Summary  Do not eat or drink anything (not even water) for 2 hours after your test, or until your numbing medicine wears off.  Do not use cigarettes. Do not use e-cigarettes.  Get help right away if you have chest pain. This information is not intended to replace advice given to you by your health care provider. Make sure you discuss any questions you have with your health care provider. Document Released: 03/07/2009 Document Revised: 04/22/2017 Document Reviewed: 05/28/2016 Elsevier Patient Education  2020 [employer].

## 2019-02-20 NOTE — Progress Notes (Signed)
Comfort measures offered. Updated on delay.

## 2019-02-20 NOTE — Interval H&P Note (Signed)
History and Physical Interval Note:  02/20/2019 11:42 AM  Carl Harrell  has presented today for surgery, with the diagnosis of LUNG MASS, MEDIASTINAL ADENOPATHY.  The various methods of treatment have been discussed with the patient and family. After consideration of risks, benefits and other options for treatment, the patient has consented to  Procedure(s): VIDEO BRONCHOSCOPY WITH ENDOBRONCHIAL ULTRASOUND (N/A) VIDEO BRONCHOSCOPY WITH ENDOBRONCHIAL NAVIGATION (N/A) as a surgical intervention.  The patient's history has been reviewed, patient examined, no change in status, stable for surgery.  I have reviewed the patient's chart and labs.  Questions were answered to the patient's satisfaction.    Patient seen in Pre-op. All risks, benefits and alternatives discussed. No barriers to proceed.   [place]

## 2019-02-20 NOTE — Transfer of Care (Signed)
Immediate Anesthesia Transfer of Care Note  Patient: KAMARII CARTON  Procedure(s) Performed: VIDEO BRONCHOSCOPY WITH ENDOBRONCHIAL ULTRASOUND (N/A )  Patient Location: PACU  Anesthesia Type:General  Level of Consciousness: drowsy and patient cooperative  Airway & Oxygen Therapy: Patient Spontanous Breathing and Patient connected to nasal cannula oxygen  Post-op Assessment: Report given to RN, Post -op Vital signs reviewed and stable and Patient moving all extremities  Post vital signs: Reviewed and stable  Last Vitals:  Vitals Value Taken Time  BP 105/87 02/20/19 1648  Temp    Pulse 93 02/20/19 1648  Resp 23 02/20/19 1648  SpO2 96 % 02/20/19 1648  Vitals shown include unvalidated device data.  Last Pain:  Vitals:   02/20/19 1113  TempSrc:   PainSc: 0-No pain         Complications: No apparent anesthesia complications

## 2019-02-21 ENCOUNTER — Other Ambulatory Visit: Payer: Self-pay

## 2019-02-21 ENCOUNTER — Encounter (HOSPITAL_COMMUNITY): Payer: Self-pay | Admitting: Pulmonary Disease

## 2019-02-21 ENCOUNTER — Telehealth: Payer: Self-pay

## 2019-02-21 NOTE — Anesthesia Postprocedure Evaluation (Signed)
Anesthesia Post Note  Patient: Carl Harrell  Procedure(s) Performed: VIDEO BRONCHOSCOPY WITH ENDOBRONCHIAL ULTRASOUND (N/A )     Patient location during evaluation: PACU Anesthesia Type: General Level of consciousness: awake and alert Pain management: pain level controlled Vital Signs Assessment: post-procedure vital signs reviewed and stable Respiratory status: spontaneous breathing, nonlabored ventilation, respiratory function stable and patient connected to nasal cannula oxygen Cardiovascular status: stable and blood pressure returned to baseline Postop Assessment: no apparent nausea or vomiting Anesthetic complications: no    Last Vitals:  Vitals:   02/20/19 1717 02/20/19 1732  BP: 124/72 115/75  Pulse: 84 84  Resp: 20 20  Temp:    SpO2: 97% 97%    Last Pain:  Vitals:   02/20/19 1730  TempSrc:   PainSc: 0-No pain                 Ross Hefferan

## 2019-02-21 NOTE — Telephone Encounter (Signed)
-----   Message from Curt Bears, MD sent at 02/20/2019  8:26 PM EDT ----- Heriberto [place] and thank you for taking care of this patient. Mohamed ----- Message ----- From: Candee Furbish, MD Sent: 02/20/2019   4:50 PM EDT To: Valrie Hart, RN, Curt Bears, MD, #  [place] can you get appt for patient with [facility] next week thank you! Dr. Julien Nordmann, case went well, prelim is NSCLC, will forward final path.  Thank you for referral, I am new partner of [facility].

## 2019-02-21 NOTE — Progress Notes (Signed)
PCCM: Thanks for seeing him. Case when well. Large RUL endobronchial lesion.  I have sent back to oncology  [facility] 02/21/2019 10:12 AM

## 2019-02-21 NOTE — Telephone Encounter (Signed)
LMTCB

## 2019-02-22 ENCOUNTER — Ambulatory Visit (HOSPITAL_COMMUNITY)
Admission: RE | Admit: 2019-02-22 | Discharge: 2019-02-22 | Disposition: A | Discharge status: Home / Self Care | Payer: Medicare Other | Source: Ambulatory Visit | Attending: Internal Medicine | Admitting: Internal Medicine

## 2019-02-22 ENCOUNTER — Other Ambulatory Visit: Payer: Self-pay

## 2019-02-22 DIAGNOSIS — C3490 Malignant neoplasm of unspecified part of unspecified bronchus or lung: Secondary | ICD-10-CM

## 2019-02-22 MED ORDER — GADOBUTROL 1 MMOL/ML IV SOLN
9.0000 mL | Freq: Once | INTRAVENOUS | Status: AC | PRN
  Administered 2019-02-22: 9 mL via INTRAVENOUS

## 2019-02-23 ENCOUNTER — Other Ambulatory Visit: Payer: Self-pay

## 2019-02-23 ENCOUNTER — Encounter (HOSPITAL_COMMUNITY)
Admission: RE | Admit: 2019-02-23 | Discharge: 2019-02-23 | Disposition: A | Discharge status: Home / Self Care | Payer: Medicare Other | Source: Ambulatory Visit | Attending: Internal Medicine | Admitting: Internal Medicine

## 2019-02-23 DIAGNOSIS — I2510 Atherosclerotic heart disease of native coronary artery without angina pectoris: Secondary | ICD-10-CM | POA: Diagnosis not present

## 2019-02-23 DIAGNOSIS — R590 Localized enlarged lymph nodes: Secondary | ICD-10-CM | POA: Diagnosis not present

## 2019-02-23 DIAGNOSIS — Z7982 Long term (current) use of aspirin: Secondary | ICD-10-CM | POA: Insufficient documentation

## 2019-02-23 DIAGNOSIS — I10 Essential (primary) hypertension: Secondary | ICD-10-CM | POA: Insufficient documentation

## 2019-02-23 DIAGNOSIS — Z79899 Other long term (current) drug therapy: Secondary | ICD-10-CM | POA: Diagnosis not present

## 2019-02-23 DIAGNOSIS — E785 Hyperlipidemia, unspecified: Secondary | ICD-10-CM | POA: Insufficient documentation

## 2019-02-23 DIAGNOSIS — R911 Solitary pulmonary nodule: Secondary | ICD-10-CM | POA: Diagnosis not present

## 2019-02-23 DIAGNOSIS — R928 Other abnormal and inconclusive findings on diagnostic imaging of breast: Secondary | ICD-10-CM | POA: Diagnosis not present

## 2019-02-23 LAB — GLUCOSE, CAPILLARY: Glucose-Capillary: 108 mg/dL — ABNORMAL HIGH (ref 70–99)

## 2019-02-23 MED ORDER — FLUDEOXYGLUCOSE F - 18 (FDG) INJECTION
10.9000 | Freq: Once | INTRAVENOUS | Status: AC
  Administered 2019-02-23: 13:00:00 10.9 via INTRAVENOUS

## 2019-02-26 ENCOUNTER — Ambulatory Visit: Payer: Medicare Other | Admitting: Pulmonary Disease

## 2019-02-27 ENCOUNTER — Encounter: Payer: Self-pay | Admitting: Pulmonary Disease

## 2019-02-27 ENCOUNTER — Other Ambulatory Visit: Payer: Self-pay

## 2019-02-27 ENCOUNTER — Telehealth: Payer: Self-pay

## 2019-02-27 ENCOUNTER — Other Ambulatory Visit: Payer: Self-pay | Admitting: *Deleted

## 2019-02-27 ENCOUNTER — Ambulatory Visit: Payer: Medicare Other | Admitting: Pulmonary Disease

## 2019-02-27 VITALS — BP 100/70 | HR 70 | Temp 98.6°F | Ht 67.0 in | Wt 199.2 lb

## 2019-02-27 DIAGNOSIS — C3490 Malignant neoplasm of unspecified part of unspecified bronchus or lung: Secondary | ICD-10-CM

## 2019-02-27 DIAGNOSIS — Z23 Encounter for immunization: Secondary | ICD-10-CM | POA: Diagnosis not present

## 2019-02-27 DIAGNOSIS — C3491 Malignant neoplasm of unspecified part of right bronchus or lung: Secondary | ICD-10-CM

## 2019-02-27 DIAGNOSIS — R918 Other nonspecific abnormal finding of lung field: Secondary | ICD-10-CM | POA: Diagnosis not present

## 2019-02-27 DIAGNOSIS — R942 Abnormal results of pulmonary function studies: Secondary | ICD-10-CM

## 2019-02-27 DIAGNOSIS — R590 Localized enlarged lymph nodes: Secondary | ICD-10-CM

## 2019-02-27 NOTE — Telephone Encounter (Signed)
Patrice this patient needs a PFT ASAP. I made BI aware the first available is 10/20 he said it cant wait that long.

## 2019-02-27 NOTE — Patient Instructions (Addendum)
Thank you for visiting Dr. Valeta Harms at [facility]. Today we recommend the following:  Orders Placed This Encounter  Procedures  . Ambulatory referral to Radiation Oncology  . Pulmonary Function Test   Return in about 8 weeks (around 04/24/2019).    Please do your part to reduce the spread of COVID-19.

## 2019-02-27 NOTE — Progress Notes (Signed)
Synopsis: Referred in October 2020 for lung mass, Dr. Earlie Server, PCP: [name], MD  Subjective:   PATIENT ID: Carl Harrell GENDER: male DOB: July 23, 1945, MRN: [id number]  Chief Complaint  Patient presents with  . Follow-up    Denies any complications/concerns following the biopsy. Reports some blood but but minimal. Needs to discuss PET scan.     Patient was initially seen by Dr. Earlie Server on 02/13/2019 after CT imaging revealed a large lung mass.  CT imaging revealed a right upper lobe lesion consistent with a primary lung neoplasm.  The patient was taken for bronchoscopy on 02/20/2019.  Video bronchoscopy with endobronchial ultrasound biopsies of station 4R was completed as well as within the right upper lobe endobronchial mass extending from the opening of the right upper lobe.   OV 02/27/2019: Today in the office patient doing well after bronchoscopy.  Occasionally having a few flecks of hemoptysis.  He is anxious about all of his diagnosis.  PET scan reviewed with patient today in the office as well as brain MRI results.  He has a pending pathology from the case done last week.  I have sent a message to our pathology department to figure out when the final path results will be posted.  Per imaging of PET scan would be consistent with a T2 a N2 M0 disease.  Of note he does have endobronchial disease on bronchoscopy invading the airway of the right upper lobe.      Past Medical History:  Diagnosis Date  . ALLERGIC RHINITIS 01/30/2007  . Arthritis    "hands" (05/29/2014)  . Cancer ([place])    lung, prostate  . Constipation   . Coronary artery disease    LHC (05/29/14): pLAD 50-70, pRCA 99 (L>R collats), EF 55% >> PCI: 3.5 x 28 mm Xience [employer]pine DES to [employer]  . Cough secondary to angiotensin converting enzyme inhibitor (ACE-I) 05/28/2013  . ED (erectile dysfunction)   . GERD 09/26/2007  . HYPERLIPIDEMIA 09/26/2007  . Hypertension   . PLANTAR FASCIITIS, BILATERAL 07/08/2009  . Unspecified  hearing loss 06/16/2009   wears aides both ears     Family History  Problem Relation Age of Onset  . Heart attack Mother   . Heart disease Mother   . Heart attack Father   . Heart disease Father   . COPD Neg Hx        family hx  . Hyperlipidemia Neg Hx        family hx     Past Surgical History:  Procedure Laterality Date  . CARDIAC CATHETERIZATION  05/29/2014   Procedure: CORONARY STENT INTERVENTION;  Surgeon: Sinclair Grooms, MD;  Location: [facility] CATH LAB;  Service: Cardiovascular;;  Prox RCA  . COLONOSCOPY    . CORONARY ANGIOPLASTY WITH STENT PLACEMENT  05/29/2014   "1"  . KNEE ARTHROSCOPY Right ~ 2005  . LEFT HEART CATHETERIZATION WITH CORONARY ANGIOGRAM N/A 05/29/2014   Procedure: LEFT HEART CATHETERIZATION WITH CORONARY ANGIOGRAM;  Surgeon: Sinclair Grooms, MD;  Location: [facility] CATH LAB;  Service: Cardiovascular;  Laterality: N/A;  . VIDEO BRONCHOSCOPY WITH ENDOBRONCHIAL ULTRASOUND N/A 02/20/2019   Procedure: VIDEO BRONCHOSCOPY WITH ENDOBRONCHIAL ULTRASOUND;  Surgeon: Garner Nash, DO;  Location: [facility] OR;  Service: Thoracic;  Laterality: N/A;    Social History   Socioeconomic History  . Marital status: Married    Spouse name: Not on file  . Number of children: Not on file  . Years of education: Not on  file  . Highest education level: Not on file  Occupational History  . Not on file  Social Needs  . Financial resource strain: Not on file  . Food insecurity    Worry: Not on file    Inability: Not on file  . Transportation needs    Medical: Not on file    Non-medical: Not on file  Tobacco Use  . Smoking status: Former Smoker    Packs/day: 0.20    Years: 15.00    Pack years: 3.00    Types: Cigarettes    Quit date: 05/24/1978    Years since quitting: 40.7  . Smokeless tobacco: Never Used  . Tobacco comment: 05/29/2014 "smoked socially; nothing since 1980"  Substance and Sexual Activity  . Alcohol use: Yes    Alcohol/week: 2.0 standard drinks    Types: 2 Shots of  liquor per week    Comment: beer or two a week  . Drug use: No  . Sexual activity: Not Currently  Lifestyle  . Physical activity    Days per week: Not on file    Minutes per session: Not on file  . Stress: Not on file  Relationships  . Social Herbalist on phone: Not on file    Gets together: Not on file    Attends religious service: Not on file    Active member of club or organization: Not on file    Attends meetings of clubs or organizations: Not on file    Relationship status: Not on file  . Intimate partner violence    Fear of current or ex partner: Not on file    Emotionally abused: Not on file    Physically abused: Not on file    Forced sexual activity: Not on file  Other Topics Concern  . Not on file  Social History Narrative   Lives with wife in a one story home.  Has 2 children.  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  Education: 3 years of college.    Does wood working.      Allergies  Allergen Reactions  . Lisinopril Cough     Outpatient Medications Prior to Visit  Medication Sig Dispense Refill  . aspirin EC 81 MG tablet Take 81 mg by mouth daily.    . benzonatate (TESSALON) 100 MG capsule Take 1 capsule (100 mg total) by mouth 2 (two) times daily as needed for cough. 20 capsule 0  . cetirizine (ZYRTEC) 10 MG tablet Take 1 tablet (10 mg total) by mouth daily. 30 tablet 11  . Cyanocobalamin (VITAMIN B-12 CR PO) Take by mouth daily.    Marland Kitchen ezetimibe (ZETIA) 10 MG tablet TAKE 1 TABLET BY MOUTH EVERY DAY (Patient taking differently: Take 10 mg by mouth daily. ) 90 tablet 3  . fluticasone (FLONASE) 50 MCG/ACT nasal spray SHAKE LIQUID AND USE 1 SPRAY IN EACH NOSTRIL DAILY (Patient taking differently: Place 1 spray into both nostrils daily. ) 48 g 0  . hydrochlorothiazide (MICROZIDE) 12.5 MG capsule Take 1 capsule (12.5 mg total) by mouth daily. 90 capsule 3  . losartan (COZAAR) 50 MG tablet Take 1 tablet (50 mg total) by mouth daily. 90 tablet 3  . metoprolol tartrate  (LOPRESSOR) 25 MG tablet TAKE 1 TABLET BY MOUTH  TWICE DAILY (Patient taking differently: Take 25 mg by mouth 2 (two) times daily. ) 180 tablet 1  . Multiple Vitamin (MULTIVITAMIN WITH MINERALS) TABS tablet Take 1 tablet by mouth daily.    Marland Kitchen NITROSTAT  0.4 MG SL tablet PLACE 1 TABLET BY MOUTH UNDER TONGUE EVERY 5 MINUTES AS NEEDED FOR CHEST PAIN (Patient taking differently: Place 0.4 mg under the tongue every 5 (five) minutes as needed for chest pain. ) 25 tablet 12  . omeprazole (PRILOSEC) 40 MG capsule TAKE 1 CAPSULE(40 MG) BY MOUTH DAILY (Patient taking differently: Take 40 mg by mouth daily. ) 90 capsule 1  . simvastatin (ZOCOR) 40 MG tablet TAKE 1 TABLET(40 MG) BY MOUTH DAILY AT 6 PM (Patient taking differently: Take 40 mg by mouth daily at 6 PM. ) 90 tablet 3  . tamsulosin (FLOMAX) 0.4 MG CAPS capsule Take 0.4 mg by mouth daily.     No facility-administered medications prior to visit.     Review of Systems  Constitutional: Negative for chills, fever, malaise/fatigue and weight loss.  HENT: Negative for hearing loss, sore throat and tinnitus.   Eyes: Negative for blurred vision and double vision.  Respiratory: Positive for cough and hemoptysis. Negative for sputum production, shortness of breath, wheezing and stridor.   Cardiovascular: Negative for chest pain, palpitations, orthopnea, leg swelling and PND.  Gastrointestinal: Negative for abdominal pain, constipation, diarrhea, heartburn, nausea and vomiting.  Genitourinary: Negative for dysuria, hematuria and urgency.  Musculoskeletal: Negative for joint pain and myalgias.  Skin: Negative for itching and rash.  Neurological: Negative for dizziness, tingling, weakness and headaches.  Endo/Heme/Allergies: Negative for environmental allergies. Does not bruise/bleed easily.  Psychiatric/Behavioral: Negative for depression. The patient is not nervous/anxious and does not have insomnia.   All other systems reviewed and are negative.     Objective:  Physical Exam Vitals signs reviewed.  Constitutional:      General: He is not in acute distress.    Appearance: He is well-developed.  HENT:     Head: Normocephalic and atraumatic.  Eyes:     General: No scleral icterus.    Conjunctiva/sclera: Conjunctivae normal.     Pupils: Pupils are equal, round, and reactive to light.  Neck:     Musculoskeletal: Neck supple.     Vascular: No JVD.     Trachea: No tracheal deviation.  Cardiovascular:     Rate and Rhythm: Normal rate and regular rhythm.     Heart sounds: Normal heart sounds. No murmur.  Pulmonary:     Effort: Pulmonary effort is normal. No tachypnea, accessory muscle usage or respiratory distress.     Breath sounds: Normal breath sounds. No stridor. No wheezing, rhonchi or rales.  Abdominal:     General: Bowel sounds are normal. There is no distension.     Palpations: Abdomen is soft.     Tenderness: There is no abdominal tenderness.  Musculoskeletal:        General: No tenderness.  Lymphadenopathy:     Cervical: No cervical adenopathy.  Skin:    General: Skin is warm and dry.     Capillary Refill: Capillary refill takes less than 2 seconds.     Findings: No rash.  Neurological:     Mental Status: He is alert and oriented to person, place, and time.  Psychiatric:        Behavior: Behavior normal.      Vitals:   02/27/19 0900  BP: 100/70  Pulse: 70  Temp: 98.6 F (37 C)  TempSrc: Temporal  SpO2: 96%  Weight: 199 lb 3.2 oz (90.4 kg)  Height: 5\' 7"  (1.702 m)   96% on RA BMI Readings from Last 3 Encounters:  02/27/19 31.20 kg/m  02/20/19  31.32 kg/m  02/15/19 31.73 kg/m   Wt Readings from Last 3 Encounters:  02/27/19 199 lb 3.2 oz (90.4 kg)  02/20/19 200 lb (90.7 kg)  02/15/19 202 lb 9.6 oz (91.9 kg)     CBC    Component Value Date/Time   WBC 9.1 02/13/2019 1333   WBC 8.0 07/18/2018 0948   RBC 5.17 02/13/2019 1333   HGB 14.2 02/13/2019 1333   HCT 44.9 02/13/2019 1333   PLT 361  02/13/2019 1333   MCV 86.8 02/13/2019 1333   MCH 27.5 02/13/2019 1333   MCHC 31.6 02/13/2019 1333   RDW 13.5 02/13/2019 1333   LYMPHSABS 2.3 02/13/2019 1333   MONOABS 0.9 02/13/2019 1333   EOSABS 0.3 02/13/2019 1333   BASOSABS 0.0 02/13/2019 1333     Chest Imaging: 02/23/2019: Nuclear medicine pet imaging right hilar metabolic lesion right upper lobe metabolic lesion consistent with T2 a N2 M0 The patient's images have been independently reviewed by me.    Brain MRI: No evidence of metastatic disease.  Pulmonary Functions Testing Results: No flowsheet data found.  FeNO: None   Pathology: None   Echocardiogram: None   Heart Catheterization: None   Bronchoscopy:      Assessment & Plan:     ICD-10-CM   1. Lung mass  R91.8 Pulmonary Function Test    Ambulatory referral to Radiation Oncology  2. Mediastinal adenopathy  R59.0 Pulmonary Function Test    Ambulatory referral to Radiation Oncology  3. Abnormal findings on diagnostic imaging of lung  R91.8   4. Abnormal PET of right lung  R94.2   5. Endobronchial cancer, right [facility])  C34.91 Ambulatory referral to Radiation Oncology    Discussion:  73 year old gentleman with a T2 a N2 M0, stage IIIa lung cancer.  Final pathology pending.  Recent bronchoscopy with evidence of endobronchial disease coming from the right upper lobe.  Please see imaging above.  Plan: Patient has follow-up with Dr. Earlie Server in oncology tomorrow. I have placed a referral for radiation oncology evaluation. Patient would benefit from radiation to the right upper lobe endobronchial lesion to help decrease amount of hemoptysis. He has only currently experiencing a few flecks of blood on occasion. We will also complete pulmonary function tests these have been ordered.  Patient return to clinic in 8 weeks.  Greater than 50% of this patient's 25-minute of visit was spent face-to-face discussing the above recommendations and treatment plan as well as  review of recent PET scan imaging, bronchoscopy images as well as MRI of the brain.    Current Outpatient Medications:  .  aspirin EC 81 MG tablet, Take 81 mg by mouth daily., Disp: , Rfl:  .  benzonatate (TESSALON) 100 MG capsule, Take 1 capsule (100 mg total) by mouth 2 (two) times daily as needed for cough., Disp: 20 capsule, Rfl: 0 .  cetirizine (ZYRTEC) 10 MG tablet, Take 1 tablet (10 mg total) by mouth daily., Disp: 30 tablet, Rfl: 11 .  Cyanocobalamin (VITAMIN B-12 CR PO), Take by mouth daily., Disp: , Rfl:  .  ezetimibe (ZETIA) 10 MG tablet, TAKE 1 TABLET BY MOUTH EVERY DAY (Patient taking differently: Take 10 mg by mouth daily. ), Disp: 90 tablet, Rfl: 3 .  fluticasone (FLONASE) 50 MCG/ACT nasal spray, SHAKE LIQUID AND USE 1 SPRAY IN EACH NOSTRIL DAILY (Patient taking differently: Place 1 spray into both nostrils daily. ), Disp: 48 g, Rfl: 0 .  hydrochlorothiazide (MICROZIDE) 12.5 MG capsule, Take 1 capsule (12.5 mg total)  by mouth daily., Disp: 90 capsule, Rfl: 3 .  losartan (COZAAR) 50 MG tablet, Take 1 tablet (50 mg total) by mouth daily., Disp: 90 tablet, Rfl: 3 .  metoprolol tartrate (LOPRESSOR) 25 MG tablet, TAKE 1 TABLET BY MOUTH  TWICE DAILY (Patient taking differently: Take 25 mg by mouth 2 (two) times daily. ), Disp: 180 tablet, Rfl: 1 .  Multiple Vitamin (MULTIVITAMIN WITH MINERALS) TABS tablet, Take 1 tablet by mouth daily., Disp: , Rfl:  .  NITROSTAT 0.4 MG SL tablet, PLACE 1 TABLET BY MOUTH UNDER TONGUE EVERY 5 MINUTES AS NEEDED FOR CHEST PAIN (Patient taking differently: Place 0.4 mg under the tongue every 5 (five) minutes as needed for chest pain. ), Disp: 25 tablet, Rfl: 12 .  omeprazole (PRILOSEC) 40 MG capsule, TAKE 1 CAPSULE(40 MG) BY MOUTH DAILY (Patient taking differently: Take 40 mg by mouth daily. ), Disp: 90 capsule, Rfl: 1 .  simvastatin (ZOCOR) 40 MG tablet, TAKE 1 TABLET(40 MG) BY MOUTH DAILY AT 6 PM (Patient taking differently: Take 40 mg by mouth daily at 6 PM.  ), Disp: 90 tablet, Rfl: 3 .  tamsulosin (FLOMAX) 0.4 MG CAPS capsule, Take 0.4 mg by mouth daily., Disp: , Rfl:    Garner Nash, DO [facility] 02/27/2019 9:36 AM

## 2019-02-28 ENCOUNTER — Inpatient Hospital Stay: Payer: Medicare Other | Attending: Internal Medicine | Admitting: Internal Medicine

## 2019-02-28 ENCOUNTER — Inpatient Hospital Stay: Payer: Medicare Other

## 2019-02-28 ENCOUNTER — Other Ambulatory Visit: Payer: Self-pay

## 2019-02-28 ENCOUNTER — Encounter: Payer: Self-pay | Admitting: Internal Medicine

## 2019-02-28 DIAGNOSIS — C3490 Malignant neoplasm of unspecified part of unspecified bronchus or lung: Secondary | ICD-10-CM

## 2019-02-28 DIAGNOSIS — C3491 Malignant neoplasm of unspecified part of right bronchus or lung: Secondary | ICD-10-CM

## 2019-02-28 DIAGNOSIS — E041 Nontoxic single thyroid nodule: Secondary | ICD-10-CM | POA: Diagnosis not present

## 2019-02-28 DIAGNOSIS — Z79899 Other long term (current) drug therapy: Secondary | ICD-10-CM | POA: Diagnosis not present

## 2019-02-28 DIAGNOSIS — I10 Essential (primary) hypertension: Secondary | ICD-10-CM | POA: Diagnosis not present

## 2019-02-28 DIAGNOSIS — C3411 Malignant neoplasm of upper lobe, right bronchus or lung: Secondary | ICD-10-CM | POA: Insufficient documentation

## 2019-02-28 DIAGNOSIS — Z7982 Long term (current) use of aspirin: Secondary | ICD-10-CM | POA: Insufficient documentation

## 2019-02-28 DIAGNOSIS — N400 Enlarged prostate without lower urinary tract symptoms: Secondary | ICD-10-CM | POA: Diagnosis not present

## 2019-02-28 DIAGNOSIS — Z7189 Other specified counseling: Secondary | ICD-10-CM

## 2019-02-28 DIAGNOSIS — R042 Hemoptysis: Secondary | ICD-10-CM | POA: Diagnosis not present

## 2019-02-28 DIAGNOSIS — R05 Cough: Secondary | ICD-10-CM | POA: Diagnosis not present

## 2019-02-28 DIAGNOSIS — Z5111 Encounter for antineoplastic chemotherapy: Secondary | ICD-10-CM | POA: Insufficient documentation

## 2019-02-28 DIAGNOSIS — R059 Cough, unspecified: Secondary | ICD-10-CM

## 2019-02-28 LAB — CBC WITH DIFFERENTIAL (CANCER CENTER ONLY)
Abs Immature Granulocytes: 0.03 10*3/uL (ref 0.00–0.07)
Basophils Absolute: 0 10*3/uL (ref 0.0–0.1)
Basophils Relative: 0 %
Eosinophils Absolute: 0.2 10*3/uL (ref 0.0–0.5)
Eosinophils Relative: 3 %
HCT: 43.8 % (ref 39.0–52.0)
Hemoglobin: 14 g/dL (ref 13.0–17.0)
Immature Granulocytes: 0 %
Lymphocytes Relative: 19 %
Lymphs Abs: 1.5 10*3/uL (ref 0.7–4.0)
MCH: 26.8 pg (ref 26.0–34.0)
MCHC: 32 g/dL (ref 30.0–36.0)
MCV: 83.9 fL (ref 80.0–100.0)
Monocytes Absolute: 0.8 10*3/uL (ref 0.1–1.0)
Monocytes Relative: 10 %
Neutro Abs: 5.3 10*3/uL (ref 1.7–7.7)
Neutrophils Relative %: 68 %
Platelet Count: 339 10*3/uL (ref 150–400)
RBC: 5.22 MIL/uL (ref 4.22–5.81)
RDW: 13.4 % (ref 11.5–15.5)
WBC Count: 7.9 10*3/uL (ref 4.0–10.5)
nRBC: 0 % (ref 0.0–0.2)

## 2019-02-28 LAB — CMP (CANCER CENTER ONLY)
ALT: 21 U/L (ref 0–44)
AST: 16 U/L (ref 15–41)
Albumin: 3.4 g/dL — ABNORMAL LOW (ref 3.5–5.0)
Alkaline Phosphatase: 87 U/L (ref 38–126)
Anion gap: 9 (ref 5–15)
BUN: 10 mg/dL (ref 8–23)
CO2: 27 mmol/L (ref 22–32)
Calcium: 8.9 mg/dL (ref 8.9–10.3)
Chloride: 103 mmol/L (ref 98–111)
Creatinine: 1.05 mg/dL (ref 0.61–1.24)
GFR, Est AFR Am: >60 mL/min (ref >60)
GFR, Estimated: >60 mL/min (ref >60)
Glucose, Bld: 114 mg/dL — ABNORMAL HIGH (ref 70–99)
Potassium: 4 mmol/L (ref 3.5–5.1)
Sodium: 139 mmol/L (ref 135–145)
Total Bilirubin: 0.7 mg/dL (ref 0.3–1.2)
Total Protein: 7.3 g/dL (ref 6.5–8.1)

## 2019-02-28 LAB — CYTOLOGY - NON PAP

## 2019-02-28 MED ORDER — PROCHLORPERAZINE MALEATE 10 MG PO TABS: 10.0000 mg | ORAL_TABLET | Freq: Four times a day (QID) | ORAL | 0 refills | Status: DC | PRN

## 2019-02-28 MED ORDER — BENZONATATE 100 MG PO CAPS: 100.0000 mg | ORAL_CAPSULE | Freq: Two times a day (BID) | ORAL | 0 refills | Status: DC | PRN

## 2019-02-28 NOTE — Progress Notes (Signed)
START ON PATHWAY REGIMEN - Non-Small Cell Lung     Administer weekly:     Paclitaxel      Carboplatin   **Always confirm dose/schedule in your pharmacy ordering system**  Patient Characteristics: Stage III - Unresectable, PS = 0, 1 AJCC T Category: T2b Current Disease Status: No Distant Mets or Local Recurrence AJCC N Category: N2 AJCC M Category: M0 AJCC 8 Stage Grouping: IIIA ECOG Performance Status: 1 Intent of Therapy: Curative Intent, Discussed with Patient

## 2019-02-28 NOTE — Progress Notes (Signed)
[facility] [phone]   [phone]  OFFICE PROGRESS NOTE  [name], MD [place] [address]  DIAGNOSIS: Stage IIIA (T2b, N2, M0) non-small cell lung cancer likely adenocarcinoma pending final tissue diagnosis presented with right upper lobe lung mass in addition to right hilar and mediastinal lymphadenopathy diagnosed in September 2020  PRIOR THERAPY: None  CURRENT THERAPY: Concurrent chemoradiation with weekly carboplatin for AUC of 2 and paclitaxel 45 mg/M2.  First dose March 05, 2019  INTERVAL HISTORY: Carl Harrell 73 y.o. male returns to the clinic today for follow-up visit.  The patient is feeling fine today with no concerning complaints except for fatigue as well as cough with few episodes of hemoptysis recently.  He has no current chest pain but has shortness of breath with exertion.  The patient denied having any recent weight loss or night sweats.  He has no nausea, vomiting, diarrhea or constipation.  He has no headache or visual changes.  He had a PET scan as well as MRI of the brain performed recently.  He also was seen by Dr. Valeta Harms and underwent bronchoscopy with endobronchial biopsy and the final pathology showed malignant cells likely non-small cell carcinoma favoring adenocarcinoma but the final pathology report is still pending further immunohistochemical staining.  The patient is here today for evaluation and discussion of his treatment options.  MEDICAL HISTORY: Past Medical History:  Diagnosis Date   ALLERGIC RHINITIS 01/30/2007   Arthritis    "hands" (05/29/2014)   Cancer ([facility] [place])    lung, prostate   Constipation    Coronary artery disease    LHC (05/29/14): pLAD 50-70, pRCA 99 (L>R collats), EF 55% >> PCI: 3.5 x 28 mm Xience Alpine DES to pRCA   Cough secondary to angiotensin converting enzyme inhibitor (ACE-I) 05/28/2013   ED (erectile dysfunction)    GERD 09/26/2007   HYPERLIPIDEMIA  09/26/2007   Hypertension    PLANTAR FASCIITIS, BILATERAL 07/08/2009   Unspecified hearing loss 06/16/2009   wears aides both ears    ALLERGIES:  is allergic to lisinopril.  MEDICATIONS:  Current Outpatient Medications  Medication Sig Dispense Refill   aspirin EC 81 MG tablet Take 81 mg by mouth daily.     benzonatate (TESSALON) 100 MG capsule Take 1 capsule (100 mg total) by mouth 2 (two) times daily as needed for cough. 20 capsule 0   cetirizine (ZYRTEC) 10 MG tablet Take 1 tablet (10 mg total) by mouth daily. 30 tablet 11   Cyanocobalamin (VITAMIN B-12 CR PO) Take by mouth daily.     ezetimibe (ZETIA) 10 MG tablet TAKE 1 TABLET BY MOUTH EVERY DAY (Patient taking differently: Take 10 mg by mouth daily. ) 90 tablet 3   fluticasone (FLONASE) 50 MCG/ACT nasal spray SHAKE LIQUID AND USE 1 SPRAY IN EACH NOSTRIL DAILY (Patient taking differently: Place 1 spray into both nostrils daily. ) 48 g 0   hydrochlorothiazide (MICROZIDE) 12.5 MG capsule Take 1 capsule (12.5 mg total) by mouth daily. 90 capsule 3   losartan (COZAAR) 50 MG tablet Take 1 tablet (50 mg total) by mouth daily. 90 tablet 3   metoprolol tartrate (LOPRESSOR) 25 MG tablet TAKE 1 TABLET BY MOUTH  TWICE DAILY (Patient taking differently: Take 25 mg by mouth 2 (two) times daily. ) 180 tablet 1   Multiple Vitamin (MULTIVITAMIN WITH MINERALS) TABS tablet Take 1 tablet by mouth daily.     NITROSTAT 0.4 MG SL  tablet PLACE 1 TABLET BY MOUTH UNDER TONGUE EVERY 5 MINUTES AS NEEDED FOR CHEST PAIN (Patient taking differently: Place 0.4 mg under the tongue every 5 (five) minutes as needed for chest pain. ) 25 tablet 12   omeprazole (PRILOSEC) 40 MG capsule TAKE 1 CAPSULE(40 MG) BY MOUTH DAILY (Patient taking differently: Take 40 mg by mouth daily. ) 90 capsule 1   simvastatin (ZOCOR) 40 MG tablet TAKE 1 TABLET(40 MG) BY MOUTH DAILY AT 6 PM (Patient taking differently: Take 40 mg by mouth daily at 6 PM. ) 90 tablet 3   tamsulosin  (FLOMAX) 0.4 MG CAPS capsule Take 0.4 mg by mouth daily.     No current facility-administered medications for this visit.     SURGICAL HISTORY:  Past Surgical History:  Procedure Laterality Date   CARDIAC CATHETERIZATION  05/29/2014   Procedure: CORONARY STENT INTERVENTION;  Surgeon: Sinclair Grooms, MD;  Location: [facility] CATH LAB;  Service: Cardiovascular;;  Prox RCA   COLONOSCOPY     CORONARY ANGIOPLASTY WITH STENT PLACEMENT  05/29/2014   "1"   KNEE ARTHROSCOPY Right ~ 2005   LEFT HEART CATHETERIZATION WITH CORONARY ANGIOGRAM N/A 05/29/2014   Procedure: LEFT HEART CATHETERIZATION WITH CORONARY ANGIOGRAM;  Surgeon: Sinclair Grooms, MD;  Location: [facility] CATH LAB;  Service: Cardiovascular;  Laterality: N/A;   VIDEO BRONCHOSCOPY WITH ENDOBRONCHIAL ULTRASOUND N/A 02/20/2019   Procedure: VIDEO BRONCHOSCOPY WITH ENDOBRONCHIAL ULTRASOUND;  Surgeon: Garner Nash, DO;  Location: [facility] OR;  Service: Thoracic;  Laterality: N/A;    REVIEW OF SYSTEMS:  Constitutional: positive for fatigue Eyes: negative Ears, nose, mouth, throat, and face: negative Respiratory: positive for cough, dyspnea on exertion and hemoptysis Cardiovascular: negative Gastrointestinal: negative Genitourinary:negative Integument/breast: negative Hematologic/lymphatic: negative Musculoskeletal:negative Neurological: negative Behavioral/Psych: negative Endocrine: negative Allergic/Immunologic: negative   PHYSICAL EXAMINATION: General appearance: alert, cooperative, fatigued and no distress Head: Normocephalic, without obvious abnormality, atraumatic Neck: no adenopathy, no JVD, supple, symmetrical, trachea midline and thyroid not enlarged, symmetric, no tenderness/mass/nodules Lymph nodes: Cervical, supraclavicular, and axillary nodes normal. Resp: clear to auscultation bilaterally Back: symmetric, no curvature. ROM normal. No CVA tenderness. Cardio: regular rate and rhythm, S1, S2 normal, no murmur, click, rub or  gallop GI: soft, non-tender; bowel sounds normal; no masses,  no organomegaly Extremities: extremities normal, atraumatic, no cyanosis or edema Neurologic: Alert and oriented X 3, normal strength and tone. Normal symmetric reflexes. Normal coordination and gait  ECOG PERFORMANCE STATUS: 1 - Symptomatic but completely ambulatory  Blood pressure 135/80, pulse 79, temperature 98.5 F (36.9 C), temperature source Temporal, resp. rate 18, height 5\' 7"  (1.702 m), weight 198 lb 1.6 oz (89.9 kg), SpO2 98 %.  LABORATORY DATA: Lab Results  Component Value Date   WBC 7.9 02/28/2019   HGB 14.0 02/28/2019   HCT 43.8 02/28/2019   MCV 83.9 02/28/2019   PLT 339 02/28/2019      Chemistry      Component Value Date/Time   NA 139 02/28/2019 1004   NA 141 08/05/2016 0826   K 4.0 02/28/2019 1004   CL 103 02/28/2019 1004   CO2 27 02/28/2019 1004   BUN 10 02/28/2019 1004   BUN 14 08/05/2016 0826   CREATININE 1.05 02/28/2019 1004      Component Value Date/Time   CALCIUM 8.9 02/28/2019 1004   ALKPHOS 87 02/28/2019 1004   AST 16 02/28/2019 1004   ALT 21 02/28/2019 1004   BILITOT 0.7 02/28/2019 1004       RADIOGRAPHIC STUDIES:  Dg Chest 2 View  Result Date: 02/01/2019 CLINICAL DATA:  Productive cough for several months EXAM: CHEST - 2 VIEW COMPARISON:  06/19/2013 FINDINGS: Cardiac shadows within normal limits. Mild aortic calcifications are seen. The lungs are well aerated bilaterally. In the right upper lobe there is a 4.3 x 4.0 cm soft tissue mass lesion consistent with primary pulmonary neoplasm till proven otherwise. Fullness in the right hilum is seen as well as some increased density posteriorly along the mid spine which is new from the prior exam portion of this density may be related to the descending aorta. IMPRESSION: Right upper lobe mass lesion suspicious for primary pulmonary neoplasm. Some fullness in the right hilum is noted as well. Contrast enhanced CT of the chest is recommended for  further evaluation. These results will be called to the ordering clinician or representative by the Radiologist Assistant, and communication documented in the PACS or zVision Dashboard. Electronically Signed   By: Inez Catalina M.D.   On: 02/01/2019 08:02   Ct Chest W Contrast  Result Date: 02/07/2019 CLINICAL DATA:  Lung cancer, biopsy proven synchronous lesions EXAM: CT CHEST WITH CONTRAST TECHNIQUE: Multidetector CT imaging of the chest was performed during intravenous contrast administration. CONTRAST:  33mL ISOVUE-300 IOPAMIDOL (ISOVUE-300) INJECTION 61% COMPARISON:  Chest radiograph, 01/31/2019 FINDINGS: Cardiovascular: Aortic atherosclerosis. Normal heart size. Three-vessel coronary artery calcifications and/or stents. No pericardial effusion. Mediastinum/Nodes: Enlarged right hilar and pretracheal lymph nodes measuring up to 1.7 x 1.4 cm (series 2, image 59). There is a rim calcified 2.5 cm hypodense nodule of the left lobe of the thyroid (series 2, image 29). Exophytic nodule of the thyroid isthmus (series 2, image 36). Trachea, and esophagus demonstrate no significant findings. Lungs/Pleura: There is a spiculated mass of the anterior right upper lobe measuring 3.0 x 2.8 cm (series 8, image 43). There is an additional, lobulated right hilar mass measuring 3.3 x 3.2 cm, which obstructs the right apical segment bronchus (series 2, image 60). No pleural effusion or pneumothorax. Upper Abdomen: No acute abnormality. Thickening of the bilateral adrenal glands without discrete mass or nodule (series 2, image 145). There is an ill-defined, somewhat wedge-shaped subcapsular lesion of the lateral right lobe superficial calcifications (series 2, image 134). Musculoskeletal: No chest wall mass or suspicious bone lesions identified. IMPRESSION: 1. There is a spiculated mass of the anterior right upper lobe measuring 3.0 x 2.8 cm (series 8, image 43). There is an additional, lobulated right hilar mass measuring 3.3 x  3.2 cm which obstructs the right apical segment bronchus (series 2, image 60). Findings are consistent with primary lung malignancy. By report, there are known biopsy-proven synchronous lesions. 2. Enlarged right hilar and pretracheal lymph nodes measuring up to 1.7 x 1.4 cm (series 2, image 59), consistent with nodal metastatic disease. No definite evidence of distant metastatic disease. 3. Thickening of the bilateral adrenal glands without discrete mass or nodule (series 2, image 145). Attention on follow-up. 4. There is an ill-defined, somewhat wedge-shaped subcapsular lesion of the lateral right lobe superficial calcifications (series 2, image 134), this appearance favoring a non malignant etiology such as hemangioma or sequelae of prior trauma. 5.  Coronary artery disease.  Aortic Atherosclerosis (ICD10-I70.0). Electronically Signed   By: Eddie Candle M.D.   On: 02/07/2019 15:52   Mr Jeri Cos PY Contrast  Result Date: 02/22/2019 CLINICAL DATA:  Malignant neoplasm of unspecified part of unspecified bronchus or lung. Lung cancer, non-small cell, staging EXAM: MRI HEAD WITHOUT AND WITH CONTRAST TECHNIQUE:  Multiplanar, multiecho pulse sequences of the brain and surrounding structures were obtained without and with intravenous contrast. CONTRAST:  40mL GADAVIST GADOBUTROL 1 MMOL/ML IV SOLN COMPARISON:  Prior MRI/MRA brain/head 01/21/2001 and 03/24/2001 FINDINGS: Brain: Multiple sequences are mildly motion degraded, including post-contrast imaging. There is no evidence of acute infarct. No evidence of intracranial mass. No midline shift or extra-axial fluid collection. No chronic intracranial blood products. Cerebral volume is normal for age. No abnormal intracranial enhancement is identified. Vascular: Flow voids maintained within the proximal large arterial vessels. Non dominant distal right vertebral artery. Skull and upper cervical spine: No focal marrow lesion. Sinuses/Orbits: Visualized orbits demonstrate no  acute abnormality. Mild scattered paranasal sinus mucosal thickening. No significant mastoid effusion. IMPRESSION: 1. Motion degraded examination. 2. No evidence of intracranial metastatic disease. 3. Normal MRI appearance of the brain for age. Electronically Signed   By: Kellie Simmering   On: 02/22/2019 13:42   Nm Pet Image Initial (pi) Skull Base To Thigh  Result Date: 02/23/2019 CLINICAL DATA:  Initial treatment strategy for pulmonary mass. EXAM: NUCLEAR MEDICINE PET SKULL BASE TO THIGH TECHNIQUE: 10.9 mCi F-18 FDG was injected intravenously. Full-ring PET imaging was performed from the skull base to thigh after the radiotracer. CT data was obtained and used for attenuation correction and anatomic localization. Fasting blood glucose: 108 mg/dl COMPARISON:  None. FINDINGS: Mediastinal blood pool activity: SUV max 2.2 Liver activity: SUV max NA NECK: Extension of the LEFT lobe of thyroid gland to substernal location. There is peripheral calcification of this nodular extension. This nodular extension is hypermetabolic SUV max equal 8.0 (image 53) and measures 2.9 x 2.3 cm. Incidental CT findings: none CHEST: Hypermetabolic RIGHT upper lobe mass measures 3.5 x 2.5 cm with intense metabolic activity (SUV max equal 18.2) Bulky RIGHT hilar lymph node measures 3.3 cm with SUV max equal 14.8. Hypermetabolic RIGHT lower paratracheal node measures 14 mm with SUV max equal 19.2. No hypermetabolic supraclavicular nodes. No contralateral hypermetabolic lymph nodes. No LEFT lung pulmonary nodules. Incidental CT findings: Coronary artery calcification and aortic atherosclerotic calcification. ABDOMEN/PELVIS: No abnormal hypermetabolic activity within the liver, pancreas, adrenal glands, or spleen. No hypermetabolic lymph nodes in the abdomen or pelvis. Incidental CT findings: Low-density lesion in the subcapsular RIGHT hepatic lobe without associated metabolic activity. Thin capsular calcification this level (image 100/4).Favor  prior trauma or infection. Prostate enlarged to 61 mm in diameter. Atherosclerotic calcification of the aorta. Small gallstone. SKELETON: No focal hypermetabolic activity to suggest skeletal metastasis. Incidental CT findings: none IMPRESSION: 1. Intensely hypermetabolic RIGHT upper lobe mass. 2. RIGHT hilar hypermetabolic metastatic adenopathy. RIGHT paratracheal hypermetabolic nodal metastasis. Staging by FDG PET imaging T2a N2 m0 3. Hypermetabolic nodule extending from the inferior aspect of the LEFT lobe of thyroid gland with differential including thyroid carcinoma versus thyroid adenoma. Metastatic disease is not favored. Consider thyroid ultrasound and potential biopsy. This lesion may be difficult to ultrasound as extends into the thoracic inlet. Electronically Signed   By: Suzy Bouchard M.D.   On: 02/23/2019 14:58    ASSESSMENT AND PLAN: This is a very pleasant 73 years old white male with recently diagnosed a stage IIIa (T2b, N2, M0) non-small cell lung cancer favoring adenocarcinoma presented with right upper lobe lung mass in addition to right hilar and mediastinal lymphadenopathy diagnosed in September 2020. The patient had a recent PET scan as well as MRI of the brain.  His MRI of the brain was negative for metastatic disease to the brain.  The PET  scan showed no evidence for extrathoracic metastasis. The final pathology is highly suspicious for non-squamous cell carcinoma, adenocarcinoma but further immunohistochemical stains are still pending. I had a lengthy discussion with the patient today about his current condition and treatment options.  The patient is currently asymptomatic with few episodes of hemoptysis as well as cough. I recommended for him a course of concurrent chemoradiation with weekly carboplatin for AUC of 2 and paclitaxel 45 mg/M2.  He is expected to start the first cycle of this treatment on March 05, 2019.  I referred the patient to Dr. Tammi Klippel for evaluation and  discussion of the radiotherapy option. I discussed with the patient the adverse effect of the chemotherapy including but not limited to alopecia, myelosuppression, nausea and vomiting, peripheral neuropathy, liver or renal dysfunction. If the patient has good response to this treatment and no evidence of progression after the course of concurrent chemoradiation, he may be considered for consolidation treatment with immunotherapy unless he is a surgical candidate at that time. We will arrange for the patient have a chemotherapy education class before the first dose of his treatment. I will call his pharmacy with prescription for Compazine 10 mg p.o. every 6 hours as needed for nausea in addition to a refill of benzoate for the cough. He will come back for follow-up visit in 1 week after the first dose of his treatment for evaluation and management of any adverse effect of his treatment. The patient agreed to the current plan. He was advised to call immediately if he has any concerning symptoms in the interval. The patient voices understanding of current disease status and treatment options and is in agreement with the current care plan.  All questions were answered. The patient knows to call the clinic with any problems, questions or concerns. We can certainly see the patient much sooner if necessary.  I spent 15 minutes counseling the patient face to face. The total time spent in the appointment was 25 minutes.  Disclaimer: This note was dictated with voice recognition software. Similar sounding words can inadvertently be transcribed and may not be corrected upon review.

## 2019-03-01 ENCOUNTER — Telehealth: Payer: Self-pay | Admitting: Internal Medicine

## 2019-03-01 ENCOUNTER — Ambulatory Visit: Payer: Medicare Other | Admitting: Radiation Oncology

## 2019-03-01 ENCOUNTER — Ambulatory Visit: Payer: Medicare Other | Admitting: Pulmonary Disease

## 2019-03-01 NOTE — Telephone Encounter (Signed)
Scheduled appt per 10/7 los - unable to reach pt . Left message with appt date and time for first chemo - no available time to schedule chemo edu - message sent to chemo edu RN to see what we can do.

## 2019-03-01 NOTE — Telephone Encounter (Signed)
PCCM:  I think we can hold for next available as they have opted for chemo+xrt per note from Dr. Julien Nordmann as of yesterday  [facility]   Garner Nash, DO [facility] 03/01/2019 5:36 PM

## 2019-03-01 NOTE — Telephone Encounter (Signed)
Discussing with June Leap to see if we can do a late afternoon add on for pft -pr

## 2019-03-01 NOTE — Telephone Encounter (Signed)
Carl Harrell is willing to do a 5pm PFT on 03/06/2019 - attempted to call pt to schedule appt - no answer and no vm -pr

## 2019-03-02 ENCOUNTER — Other Ambulatory Visit: Payer: Self-pay | Admitting: *Deleted

## 2019-03-02 ENCOUNTER — Ambulatory Visit
Admission: RE | Admit: 2019-03-02 | Discharge: 2019-03-02 | Disposition: A | Discharge status: Home / Self Care | Payer: Medicare Other | Source: Ambulatory Visit | Attending: Radiation Oncology | Admitting: Radiation Oncology

## 2019-03-02 ENCOUNTER — Ambulatory Visit
Admission: RE | Admit: 2019-03-02 | Discharge: 2019-03-02 | Disposition: A | Discharge status: Home / Self Care | Payer: Medicare Other | Source: Ambulatory Visit | Attending: Urology | Admitting: Urology

## 2019-03-02 ENCOUNTER — Inpatient Hospital Stay: Payer: Medicare Other

## 2019-03-02 ENCOUNTER — Other Ambulatory Visit: Payer: Self-pay

## 2019-03-02 ENCOUNTER — Encounter: Payer: Self-pay | Admitting: Urology

## 2019-03-02 VITALS — BP 107/80 | HR 78 | Temp 97.8°F | Resp 18 | Wt 197.4 lb

## 2019-03-02 DIAGNOSIS — C3411 Malignant neoplasm of upper lobe, right bronchus or lung: Secondary | ICD-10-CM | POA: Diagnosis not present

## 2019-03-02 DIAGNOSIS — C3491 Malignant neoplasm of unspecified part of right bronchus or lung: Secondary | ICD-10-CM

## 2019-03-02 DIAGNOSIS — Z510 Encounter for antineoplastic radiation therapy: Secondary | ICD-10-CM | POA: Diagnosis not present

## 2019-03-02 DIAGNOSIS — Z923 Personal history of irradiation: Secondary | ICD-10-CM | POA: Diagnosis not present

## 2019-03-02 DIAGNOSIS — C3490 Malignant neoplasm of unspecified part of unspecified bronchus or lung: Secondary | ICD-10-CM | POA: Diagnosis not present

## 2019-03-02 DIAGNOSIS — C61 Malignant neoplasm of prostate: Secondary | ICD-10-CM

## 2019-03-02 DIAGNOSIS — R590 Localized enlarged lymph nodes: Secondary | ICD-10-CM | POA: Diagnosis not present

## 2019-03-02 DIAGNOSIS — Z87891 Personal history of nicotine dependence: Secondary | ICD-10-CM | POA: Diagnosis not present

## 2019-03-02 NOTE — Progress Notes (Signed)
Radiation Oncology         ([phone]) [phone] ________________________________  Outpatient Re-Consultation  Name: Carl Harrell MRN: [id number]  Date: 03/02/2019  DOB: April 12, 1946  BW:LSLHT, Langley Adie, MD  Garner Nash, DO   REFERRING PHYSICIAN: Garner Nash, DO  DIAGNOSIS: 73 y.o. gentleman with newly diagnosed Stage IIIA (T2a, N2, M0) non-small cell lung cancer favoring adenocarcinoma presented with right upper lobe lung mass in addition to right hilar and mediastinal lymphadenopathy.    ICD-10-CM   1. Adenocarcinoma of right lung, stage 3 (HCC)  C34.91     HISTORY OF PRESENT ILLNESS: PELLEGRINO Harrell is a 73 y.o. male with a new diagnosis of Stage IIIA NSCLC of the RUL. He was last seen in our office on 06/01/2017 following treatment for prostate cancer. He reports that his prostate cancer has remained under good control- monitored with Dr. Karsten Ro. He presented to his PCP on 01/30/2019 with productive cough for several months. A chest x-ray performed on 01/31/2019 revealed a 4.3 cm suspicious right upper lobe mass as well as some fullness in the right hilum. This was further evaluated with a CT chest performed on 02/07/2019 which confirmed a worrisome 3 cm spiculated mass of the anterior right upper lobe with a  3.3 cm lobular right hilar mass which obstructs the right apical segment bronchus and enlarged right hilar and pretracheal lymph nodes measuring up to 1.7 cm.  There was no definite evidence of distant metastatic disease.  The patient was referred to Dr. Julien Nordmann on 02/13/2019, who recommended PET scan and brain MRI to complete his disease staging.  He was also referral to Dr. Valeta Harms for consideration of bronchoscopy/biopsy for tissue confirmation. The brain MRI was performed on 02/22/2019 and showed no evidence of intracranial metastatic disease.  PET scan performed on 02/23/2019 showed intensely hypermetabolic right upper lobe mass with hypermetabolic right hilar metastatic adenopathy and  right paratracheal nodal metastasis.  Additionally, there was a hypermetabolic nodule extending from the inferior aspect of the left lobe of thyroid gland, of questionable significant but metastatic disease was not favored. The recommendation was for further evaluation with thyroid ultrasound, +/- biopsy, though it was felt that ultrasound may be difficult due to the fact that this lesion appears to extend into the thoracic outlet.  He proceeded to bronchoscopy with EBUS on 02/20/2019 for tissue biopsy under the care and direction of Dr. Valeta Harms. Final surgical pathology revealed malignant cells within both biopsied lymph nodes, with morphology favoring NSCLC, adenocarcinoma but further immunohistochemical stains are pending.  The patient reviewed the biopsy results with Dr. Julien Nordmann, who recommended concurrent chemoradiation of curative intent. He plans to start the patient on weekly carboplatin and paclitaxel beginning 03/05/2019. He has kindly been referred today for discussion of potential radiation treatment options.  PREVIOUS RADIATION THERAPY: Yes  03/08/17 - 04/29/17: The prostate was treated to 78 Gy in 40 fractions of 1.95 Gy.  PAST MEDICAL HISTORY:  Past Medical History:  Diagnosis Date   ALLERGIC RHINITIS 01/30/2007   Arthritis    "hands" (05/29/2014)   Cancer ([place])    lung, prostate   Constipation    Coronary artery disease    LHC (05/29/14): pLAD 50-70, pRCA 99 (L>R collats), EF 55% >> PCI: 3.5 x 28 mm Xience Alpine DES to pRCA   Cough secondary to angiotensin converting enzyme inhibitor (ACE-I) 05/28/2013   ED (erectile dysfunction)    GERD 09/26/2007   HYPERLIPIDEMIA 09/26/2007   Hypertension    PLANTAR FASCIITIS, BILATERAL  07/08/2009   Unspecified hearing loss 06/16/2009   wears aides both ears      PAST SURGICAL HISTORY: Past Surgical History:  Procedure Laterality Date   CARDIAC CATHETERIZATION  05/29/2014   Procedure: CORONARY STENT INTERVENTION;  Surgeon: Sinclair Grooms, MD;  Location: [facility] CATH LAB;  Service: Cardiovascular;;  Prox RCA   COLONOSCOPY     CORONARY ANGIOPLASTY WITH STENT PLACEMENT  05/29/2014   "1"   KNEE ARTHROSCOPY Right ~ 2005   LEFT HEART CATHETERIZATION WITH CORONARY ANGIOGRAM N/A 05/29/2014   Procedure: LEFT HEART CATHETERIZATION WITH CORONARY ANGIOGRAM;  Surgeon: Sinclair Grooms, MD;  Location: [facility] CATH LAB;  Service: Cardiovascular;  Laterality: N/A;   VIDEO BRONCHOSCOPY WITH ENDOBRONCHIAL ULTRASOUND N/A 02/20/2019   Procedure: VIDEO BRONCHOSCOPY WITH ENDOBRONCHIAL ULTRASOUND;  Surgeon: Garner Nash, DO;  Location: [facility] OR;  Service: Thoracic;  Laterality: N/A;    FAMILY HISTORY:  Family History  Problem Relation Age of Onset   Heart attack Mother    Heart disease Mother    Heart attack Father    Heart disease Father    COPD Neg Hx        family hx   Hyperlipidemia Neg Hx        family hx    SOCIAL HISTORY:  Social History   Socioeconomic History   Marital status: Married    Spouse name: Not on file   Number of children: Not on file   Years of education: Not on file   Highest education level: Not on file  Occupational History   Not on file  Social Needs   Financial resource strain: Not on file   Food insecurity    Worry: Not on file    Inability: Not on file   Transportation needs    Medical: Not on file    Non-medical: Not on file  Tobacco Use   Smoking status: Former Smoker    Packs/day: 0.20    Years: 15.00    Pack years: 3.00    Types: Cigarettes    Quit date: 05/24/1978    Years since quitting: 40.8   Smokeless tobacco: Never Used   Tobacco comment: 05/29/2014 "smoked socially; nothing since 1980"  Substance and Sexual Activity   Alcohol use: Yes    Alcohol/week: 2.0 standard drinks    Types: 2 Shots of liquor per week    Comment: beer or two a week   Drug use: No   Sexual activity: Not Currently  Lifestyle   Physical activity    Days per week: Not on file     Minutes per session: Not on file   Stress: Not on file  Relationships   Social connections    Talks on phone: Not on file    Gets together: Not on file    Attends religious service: Not on file    Active member of club or organization: Not on file    Attends meetings of clubs or organizations: Not on file    Relationship status: Not on file   Intimate partner violence    Fear of current or ex partner: Not on file    Emotionally abused: Not on file    Physically abused: Not on file    Forced sexual activity: Not on file  Other Topics Concern   Not on file  Social History Narrative   Lives with wife in a one story home.  Has 2 children.  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  Education: 3  years of college.    Does wood working.     ALLERGIES: Lisinopril  MEDICATIONS:  Current Outpatient Medications  Medication Sig Dispense Refill   benzonatate (TESSALON) 100 MG capsule Take 1 capsule (100 mg total) by mouth 2 (two) times daily as needed for cough. 20 capsule 0   cetirizine (ZYRTEC) 10 MG tablet Take 1 tablet (10 mg total) by mouth daily. 30 tablet 11   Cyanocobalamin (VITAMIN B-12 CR PO) Take by mouth daily.     ezetimibe (ZETIA) 10 MG tablet TAKE 1 TABLET BY MOUTH EVERY DAY (Patient taking differently: Take 10 mg by mouth daily. ) 90 tablet 3   fluticasone (FLONASE) 50 MCG/ACT nasal spray SHAKE LIQUID AND USE 1 SPRAY IN EACH NOSTRIL DAILY (Patient taking differently: Place 1 spray into both nostrils daily. ) 48 g 0   hydrochlorothiazide (MICROZIDE) 12.5 MG capsule Take 1 capsule (12.5 mg total) by mouth daily. 90 capsule 3   losartan (COZAAR) 50 MG tablet Take 1 tablet (50 mg total) by mouth daily. 90 tablet 3   metoprolol tartrate (LOPRESSOR) 25 MG tablet TAKE 1 TABLET BY MOUTH  TWICE DAILY (Patient taking differently: Take 25 mg by mouth 2 (two) times daily. ) 180 tablet 1   Multiple Vitamin (MULTIVITAMIN WITH MINERALS) TABS tablet Take 1 tablet by mouth daily.     NITROSTAT 0.4 MG  SL tablet PLACE 1 TABLET BY MOUTH UNDER TONGUE EVERY 5 MINUTES AS NEEDED FOR CHEST PAIN (Patient taking differently: Place 0.4 mg under the tongue every 5 (five) minutes as needed for chest pain. ) 25 tablet 12   omeprazole (PRILOSEC) 40 MG capsule TAKE 1 CAPSULE(40 MG) BY MOUTH DAILY (Patient taking differently: Take 40 mg by mouth daily. ) 90 capsule 1   prochlorperazine (COMPAZINE) 10 MG tablet Take 1 tablet (10 mg total) by mouth every 6 (six) hours as needed for nausea or vomiting. 30 tablet 0   simvastatin (ZOCOR) 40 MG tablet TAKE 1 TABLET(40 MG) BY MOUTH DAILY AT 6 PM (Patient taking differently: Take 40 mg by mouth daily at 6 PM. ) 90 tablet 3   tamsulosin (FLOMAX) 0.4 MG CAPS capsule Take 0.4 mg by mouth daily.     No current facility-administered medications for this encounter.     REVIEW OF SYSTEMS:  On review of systems, the patient reports that he is doing well overall. He denies any chest pain, increased shortness of breath, fevers, chills, or night sweats.  He continues with a dry, hacking cough and has had some intermittent hemoptysis since the time of biopsy.  He has not seen any blood in the past 2 days but reports having coughed up a fair amount of blood on Wednesday 02/28/19.  He has had an approximate 10 lb unintended weight loss over the past month. He denies any bowel disturbances, and denies abdominal pain, nausea or vomiting. He denies any new musculoskeletal or joint aches or pains.  A complete review of systems is obtained and is otherwise negative.  PHYSICAL EXAM:  Wt Readings from Last 3 Encounters:  03/02/19 197 lb 6.4 oz (89.5 kg)  02/28/19 198 lb 1.6 oz (89.9 kg)  02/27/19 199 lb 3.2 oz (90.4 kg)   Temp Readings from Last 3 Encounters:  03/02/19 97.8 F (36.6 C)  02/28/19 98.5 F (36.9 C) (Temporal)  02/27/19 98.6 F (37 C) (Temporal)   BP Readings from Last 3 Encounters:  03/02/19 107/80  02/28/19 135/80  02/27/19 100/70   Pulse Readings from  Last 3  Encounters:  03/02/19 78  02/28/19 79  02/27/19 70   Pain Assessment Pain Score: 0-No pain/10  In general this is a well appearing Caucasian male in no acute distress. He's alert and oriented x4 and appropriate throughout the examination. Cardiopulmonary assessment is negative for acute distress and he exhibits normal effort.   KPS = 80  100 - Normal; no complaints; no evidence of disease. 90   - Able to carry on normal activity; minor signs or symptoms of disease. 80   - Normal activity with effort; some signs or symptoms of disease. 39   - Cares for self; unable to carry on normal activity or to do active work. 60   - Requires occasional assistance, but is able to care for most of his personal needs. 50   - Requires considerable assistance and frequent medical care. 62   - Disabled; requires special care and assistance. 42   - Severely disabled; hospital admission is indicated although death not imminent. 20   - Very sick; hospital admission necessary; active supportive treatment necessary. 10   - Moribund; fatal processes progressing rapidly. 0     - Dead  Karnofsky DA, Abelmann [place], Craver LS and [place] JH [phone]) The use of the nitrogen mustards in the palliative treatment of carcinoma: with particular reference to bronchogenic carcinoma Cancer 1 [id number]  LABORATORY DATA:  Lab Results  Component Value Date   WBC 7.9 02/28/2019   HGB 14.0 02/28/2019   HCT 43.8 02/28/2019   MCV 83.9 02/28/2019   PLT 339 02/28/2019   Lab Results  Component Value Date   NA 139 02/28/2019   K 4.0 02/28/2019   CL 103 02/28/2019   CO2 27 02/28/2019   Lab Results  Component Value Date   ALT 21 02/28/2019   AST 16 02/28/2019   ALKPHOS 87 02/28/2019   BILITOT 0.7 02/28/2019     RADIOGRAPHY: Ct Chest W Contrast  Result Date: 02/07/2019 CLINICAL DATA:  Lung cancer, biopsy proven synchronous lesions EXAM: CT CHEST WITH CONTRAST TECHNIQUE: Multidetector CT imaging of the chest was performed  during intravenous contrast administration. CONTRAST:  24mL ISOVUE-300 IOPAMIDOL (ISOVUE-300) INJECTION 61% COMPARISON:  Chest radiograph, 01/31/2019 FINDINGS: Cardiovascular: Aortic atherosclerosis. Normal heart size. Three-vessel coronary artery calcifications and/or stents. No pericardial effusion. Mediastinum/Nodes: Enlarged right hilar and pretracheal lymph nodes measuring up to 1.7 x 1.4 cm (series 2, image 59). There is a rim calcified 2.5 cm hypodense nodule of the left lobe of the thyroid (series 2, image 29). Exophytic nodule of the thyroid isthmus (series 2, image 36). Trachea, and esophagus demonstrate no significant findings. Lungs/Pleura: There is a spiculated mass of the anterior right upper lobe measuring 3.0 x 2.8 cm (series 8, image 43). There is an additional, lobulated right hilar mass measuring 3.3 x 3.2 cm, which obstructs the right apical segment bronchus (series 2, image 60). No pleural effusion or pneumothorax. Upper Abdomen: No acute abnormality. Thickening of the bilateral adrenal glands without discrete mass or nodule (series 2, image 145). There is an ill-defined, somewhat wedge-shaped subcapsular lesion of the lateral right lobe superficial calcifications (series 2, image 134). Musculoskeletal: No chest wall mass or suspicious bone lesions identified. IMPRESSION: 1. There is a spiculated mass of the anterior right upper lobe measuring 3.0 x 2.8 cm (series 8, image 43). There is an additional, lobulated right hilar mass measuring 3.3 x 3.2 cm which obstructs the right apical segment bronchus (series 2, image 60). Findings are consistent with  primary lung malignancy. By report, there are known biopsy-proven synchronous lesions. 2. Enlarged right hilar and pretracheal lymph nodes measuring up to 1.7 x 1.4 cm (series 2, image 59), consistent with nodal metastatic disease. No definite evidence of distant metastatic disease. 3. Thickening of the bilateral adrenal glands without discrete mass  or nodule (series 2, image 145). Attention on follow-up. 4. There is an ill-defined, somewhat wedge-shaped subcapsular lesion of the lateral right lobe superficial calcifications (series 2, image 134), this appearance favoring a non malignant etiology such as hemangioma or sequelae of prior trauma. 5.  Coronary artery disease.  Aortic Atherosclerosis (ICD10-I70.0). Electronically Signed   By: Eddie Candle M.D.   On: 02/07/2019 15:52   Mr Jeri Cos OV Contrast  Result Date: 02/22/2019 CLINICAL DATA:  Malignant neoplasm of unspecified part of unspecified bronchus or lung. Lung cancer, non-small cell, staging EXAM: MRI HEAD WITHOUT AND WITH CONTRAST TECHNIQUE: Multiplanar, multiecho pulse sequences of the brain and surrounding structures were obtained without and with intravenous contrast. CONTRAST:  5mL GADAVIST GADOBUTROL 1 MMOL/ML IV SOLN COMPARISON:  Prior MRI/MRA brain/head 01/21/2001 and 03/24/2001 FINDINGS: Brain: Multiple sequences are mildly motion degraded, including post-contrast imaging. There is no evidence of acute infarct. No evidence of intracranial mass. No midline shift or extra-axial fluid collection. No chronic intracranial blood products. Cerebral volume is normal for age. No abnormal intracranial enhancement is identified. Vascular: Flow voids maintained within the proximal large arterial vessels. Non dominant distal right vertebral artery. Skull and upper cervical spine: No focal marrow lesion. Sinuses/Orbits: Visualized orbits demonstrate no acute abnormality. Mild scattered paranasal sinus mucosal thickening. No significant mastoid effusion. IMPRESSION: 1. Motion degraded examination. 2. No evidence of intracranial metastatic disease. 3. Normal MRI appearance of the brain for age. Electronically Signed   By: Kellie Simmering   On: 02/22/2019 13:42   Nm Pet Image Initial (pi) Skull Base To Thigh  Result Date: 02/23/2019 CLINICAL DATA:  Initial treatment strategy for pulmonary mass. EXAM:  NUCLEAR MEDICINE PET SKULL BASE TO THIGH TECHNIQUE: 10.9 mCi F-18 FDG was injected intravenously. Full-ring PET imaging was performed from the skull base to thigh after the radiotracer. CT data was obtained and used for attenuation correction and anatomic localization. Fasting blood glucose: 108 mg/dl COMPARISON:  None. FINDINGS: Mediastinal blood pool activity: SUV max 2.2 Liver activity: SUV max NA NECK: Extension of the LEFT lobe of thyroid gland to substernal location. There is peripheral calcification of this nodular extension. This nodular extension is hypermetabolic SUV max equal 8.0 (image 53) and measures 2.9 x 2.3 cm. Incidental CT findings: none CHEST: Hypermetabolic RIGHT upper lobe mass measures 3.5 x 2.5 cm with intense metabolic activity (SUV max equal 18.2) Bulky RIGHT hilar lymph node measures 3.3 cm with SUV max equal 14.8. Hypermetabolic RIGHT lower paratracheal node measures 14 mm with SUV max equal 19.2. No hypermetabolic supraclavicular nodes. No contralateral hypermetabolic lymph nodes. No LEFT lung pulmonary nodules. Incidental CT findings: Coronary artery calcification and aortic atherosclerotic calcification. ABDOMEN/PELVIS: No abnormal hypermetabolic activity within the liver, pancreas, adrenal glands, or spleen. No hypermetabolic lymph nodes in the abdomen or pelvis. Incidental CT findings: Low-density lesion in the subcapsular RIGHT hepatic lobe without associated metabolic activity. Thin capsular calcification this level (image 100/4).Favor prior trauma or infection. Prostate enlarged to 61 mm in diameter. Atherosclerotic calcification of the aorta. Small gallstone. SKELETON: No focal hypermetabolic activity to suggest skeletal metastasis. Incidental CT findings: none IMPRESSION: 1. Intensely hypermetabolic RIGHT upper lobe mass. 2. RIGHT hilar hypermetabolic metastatic  adenopathy. RIGHT paratracheal hypermetabolic nodal metastasis. Staging by FDG PET imaging T2a N2 m0 3. Hypermetabolic  nodule extending from the inferior aspect of the LEFT lobe of thyroid gland with differential including thyroid carcinoma versus thyroid adenoma. Metastatic disease is not favored. Consider thyroid ultrasound and potential biopsy. This lesion may be difficult to ultrasound as extends into the thoracic inlet. Electronically Signed   By: Suzy Bouchard M.D.   On: 02/23/2019 14:58      IMPRESSION/PLAN:   1. 73 y.o. gentleman with newly diagnosed stage IIIA (T2b, N2, M0) non-small cell lung cancer favoring adenocarcinoma presented with right upper lobe lung mass in addition to right hilar and mediastinal lymphadenopathy. Today, we talked to the patient about the findings and workup thus far. We discussed the natural history of NSCLC, adenocarcinoma and general treatment, highlighting the role of radiotherapy in the management. We discussed the available radiation techniques, and focused on the details of logistics and delivery. The recommendation is to proceed with a 6.5 week course of daily radiation concurrent with systemic chemotherapy with curative intent. We reviewed the anticipated acute and late sequelae associated with radiation in this setting. The patient was encouraged to ask questions that were answered to his satisfaction.  At the end of the conversation the patient is interested in moving forward with 6.5 week course of concurrent chemoradiation as recommended.  He appears to have a good understanding of his disease and our recommendations and has freely signed written consent to proceed today in the office.  A copy of this document will be placed in his medical record and he will undergo CT SIM/treatment planning following our visit today in anticipation of beginning his radiation treatments on Monday 03/05/19. We will share this discussion with Dr. Julien Nordmann and move forward with treatment planning accordingly.  The patient knows to call at any time with any questions or concerns.      Nicholos Johns, PA-C    Tyler Pita, MD  [facility] Direct Dial: [phone]   Fax: [phone] [email or web address]   Skype   [email or web address]  This document serves as a record of services personally performed by Tyler Pita, MD and Freeman Caldron, PA-C. It was created on their behalf by Wilburn Mylar, a trained medical scribe. The creation of this record is based on the scribe's personal observations and the provider's statements to them. This document has been checked and approved by the attending provider.

## 2019-03-02 NOTE — Telephone Encounter (Signed)
Pt scheduled for pft on 04/25/2019-pr

## 2019-03-02 NOTE — Patient Instructions (Signed)
Coronavirus (COVID-19) Are you at risk?  Are you at risk for the Coronavirus (COVID-19)?  To be considered HIGH RISK for Coronavirus (COVID-19), you have to meet the following criteria:  . Traveled to [place], [place], [place], [place] or [place]; or in the [place] to [place], [place], [place], or [place]; and have fever, cough, and shortness of breath within the last 2 weeks of travel OR . Been in close contact with a person diagnosed with COVID-19 within the last 2 weeks and have fever, cough, and shortness of breath . IF YOU DO NOT MEET THESE CRITERIA, YOU ARE CONSIDERED LOW RISK FOR COVID-19.  What to do if you are HIGH RISK for COVID-19?  . If you are having a medical emergency, call 911. . Seek medical care right away. Before you go to a doctor's office, urgent care or emergency department, call ahead and tell them about your recent travel, contact with someone diagnosed with COVID-19, and your symptoms. You should receive instructions from your physician's office regarding next steps of care.  . When you arrive at healthcare provider, tell the healthcare staff immediately you have returned from visiting [place], [place], [place], [place] or [place]; or traveled in the [place] to [place], [place], [place], or [place]; in the last two weeks or you have been in close contact with a person diagnosed with COVID-19 in the last 2 weeks.   . Tell the health care staff about your symptoms: fever, cough and shortness of breath. . After you have been seen by a medical provider, you will be either: o Tested for (COVID-19) and discharged home on quarantine except to seek medical care if symptoms worsen, and asked to  - Stay home and avoid contact with others until you get your results (4-5 days)  - Avoid travel on public transportation if possible (such as bus, train, or airplane) or o Sent to the Emergency Department by EMS for evaluation, COVID-19 testing, and possible  admission depending on your condition and test results.  What to do if you are LOW RISK for COVID-19?  Reduce your risk of any infection by using the same precautions used for avoiding the common cold or flu:  . Wash your hands often with soap and warm water for at least 20 seconds.  If soap and water are not readily available, use an alcohol-based hand sanitizer with at least 60% alcohol.  . If coughing or sneezing, cover your mouth and nose by coughing or sneezing into the elbow areas of your shirt or coat, into a tissue or into your sleeve (not your hands). . Avoid shaking hands with others and consider head nods or verbal greetings only. . Avoid touching your eyes, nose, or mouth with unwashed hands.  . Avoid close contact with people who are sick. . Avoid places or events with large numbers of people in one location, like concerts or sporting events. . Carefully consider travel plans you have or are making. . If you are planning any travel outside or inside the [place], visit the CDC's Travelers' Health webpage for the latest health notices. . If you have some symptoms but not all symptoms, continue to monitor at home and seek medical attention if your symptoms worsen. . If you are having a medical emergency, call 911.   ADDITIONAL HEALTHCARE OPTIONS FOR PATIENTS  [facility] / e-Visit: [email or web address]         [facility]: [phone]  [facility]: [phone]                   [facility]: [phone]   

## 2019-03-03 NOTE — Progress Notes (Signed)
  Radiation Oncology         [phone]) [phone] ________________________________  Name: Carl Harrell MRN: [id number]  Date: 03/02/2019  DOB: 1945-06-02  SIMULATION AND TREATMENT PLANNING NOTE    ICD-10-CM   1. Prostate cancer ([place])  C61     DIAGNOSIS:  73 y.o. gentleman with newly diagnosed Stage IIIA (T2a, N2, M0) non-small cell lung cancer favoring adenocarcinoma  NARRATIVE:  The patient was brought to the [facility].  Identity was confirmed.  All relevant records and images related to the planned course of therapy were reviewed.  The patient freely provided informed written consent to proceed with treatment after reviewing the details related to the planned course of therapy. The consent form was witnessed and verified by the simulation staff.  Then, the patient was set-up in a stable reproducible  supine position for radiation therapy.  CT images were obtained.  Surface markings were placed.  The CT images were loaded into the planning software.  Then the target and avoidance structures were contoured.  Treatment planning then occurred.  The radiation prescription was entered and confirmed.  Then, I designed and supervised the construction of a total of 6 medically necessary complex treatment devices, including a BodyFix immobilization mold custom fitted to the patient along with 5 multileaf collimators conformally shaped radiation around the treatment target while shielding critical structures such as the heart and spinal cord maximally.  I have requested : 3D Simulation  I have requested a DVH of the following structures: Left lung, right lung, spinal cord, heart, esophagus, and target.  I have ordered:Nutrition Consult  SPECIAL TREATMENT PROCEDURE:  The planned course of therapy using radiation constitutes a special treatment procedure. Special care is required in the management of this patient for the following reasons.  The patient will be receiving concurrent chemotherapy  requiring careful monitoring for increased toxicities of treatment including periodic laboratory values.  The special nature of the planned course of radiotherapy will require increased physician supervision and oversight to ensure patient's safety with optimal treatment outcomes.  PLAN:  The patient will receive 66 Gy in 33 fractions.  ________________________________  Sheral Apley Tammi Klippel, M.D.

## 2019-03-05 ENCOUNTER — Inpatient Hospital Stay: Payer: Medicare Other

## 2019-03-05 ENCOUNTER — Other Ambulatory Visit: Payer: Self-pay

## 2019-03-05 ENCOUNTER — Ambulatory Visit
Admission: RE | Admit: 2019-03-05 | Discharge: 2019-03-05 | Disposition: A | Discharge status: Home / Self Care | Payer: Medicare Other | Source: Ambulatory Visit | Attending: Radiation Oncology | Admitting: Radiation Oncology

## 2019-03-05 VITALS — BP 116/70 | HR 79 | Temp 98.3°F | Resp 16 | Wt 199.0 lb

## 2019-03-05 DIAGNOSIS — C3491 Malignant neoplasm of unspecified part of right bronchus or lung: Secondary | ICD-10-CM

## 2019-03-05 DIAGNOSIS — C3411 Malignant neoplasm of upper lobe, right bronchus or lung: Secondary | ICD-10-CM | POA: Diagnosis not present

## 2019-03-05 DIAGNOSIS — C3490 Malignant neoplasm of unspecified part of unspecified bronchus or lung: Secondary | ICD-10-CM | POA: Diagnosis not present

## 2019-03-05 DIAGNOSIS — R042 Hemoptysis: Secondary | ICD-10-CM | POA: Diagnosis not present

## 2019-03-05 DIAGNOSIS — I10 Essential (primary) hypertension: Secondary | ICD-10-CM | POA: Diagnosis not present

## 2019-03-05 DIAGNOSIS — Z5111 Encounter for antineoplastic chemotherapy: Secondary | ICD-10-CM | POA: Diagnosis not present

## 2019-03-05 DIAGNOSIS — Z7982 Long term (current) use of aspirin: Secondary | ICD-10-CM | POA: Diagnosis not present

## 2019-03-05 DIAGNOSIS — E041 Nontoxic single thyroid nodule: Secondary | ICD-10-CM | POA: Diagnosis not present

## 2019-03-05 DIAGNOSIS — Z510 Encounter for antineoplastic radiation therapy: Secondary | ICD-10-CM | POA: Diagnosis not present

## 2019-03-05 DIAGNOSIS — Z79899 Other long term (current) drug therapy: Secondary | ICD-10-CM | POA: Diagnosis not present

## 2019-03-05 LAB — CBC WITH DIFFERENTIAL (CANCER CENTER ONLY)
Abs Immature Granulocytes: 0.03 10*3/uL (ref 0.00–0.07)
Basophils Absolute: 0 10*3/uL (ref 0.0–0.1)
Basophils Relative: 0 %
Eosinophils Absolute: 0.3 10*3/uL (ref 0.0–0.5)
Eosinophils Relative: 3 %
HCT: 42.3 % (ref 39.0–52.0)
Hemoglobin: 13.6 g/dL (ref 13.0–17.0)
Immature Granulocytes: 0 %
Lymphocytes Relative: 18 %
Lymphs Abs: 1.7 10*3/uL (ref 0.7–4.0)
MCH: 26.8 pg (ref 26.0–34.0)
MCHC: 32.2 g/dL (ref 30.0–36.0)
MCV: 83.3 fL (ref 80.0–100.0)
Monocytes Absolute: 0.9 10*3/uL (ref 0.1–1.0)
Monocytes Relative: 9 %
Neutro Abs: 6.4 10*3/uL (ref 1.7–7.7)
Neutrophils Relative %: 70 %
Platelet Count: 370 10*3/uL (ref 150–400)
RBC: 5.08 MIL/uL (ref 4.22–5.81)
RDW: 13.5 % (ref 11.5–15.5)
WBC Count: 9.2 10*3/uL (ref 4.0–10.5)
nRBC: 0 % (ref 0.0–0.2)

## 2019-03-05 LAB — CMP (CANCER CENTER ONLY)
ALT: 16 U/L (ref 0–44)
AST: 13 U/L — ABNORMAL LOW (ref 15–41)
Albumin: 3.4 g/dL — ABNORMAL LOW (ref 3.5–5.0)
Alkaline Phosphatase: 85 U/L (ref 38–126)
Anion gap: 10 (ref 5–15)
BUN: 10 mg/dL (ref 8–23)
CO2: 26 mmol/L (ref 22–32)
Calcium: 9 mg/dL (ref 8.9–10.3)
Chloride: 103 mmol/L (ref 98–111)
Creatinine: 1 mg/dL (ref 0.61–1.24)
GFR, Est AFR Am: >60 mL/min (ref >60)
GFR, Estimated: >60 mL/min (ref >60)
Glucose, Bld: 104 mg/dL — ABNORMAL HIGH (ref 70–99)
Potassium: 4 mmol/L (ref 3.5–5.1)
Sodium: 139 mmol/L (ref 135–145)
Total Bilirubin: 0.5 mg/dL (ref 0.3–1.2)
Total Protein: 7.3 g/dL (ref 6.5–8.1)

## 2019-03-05 MED ORDER — SODIUM CHLORIDE 0.9 % IV SOLN
204.6000 mg | Freq: Once | INTRAVENOUS | Status: AC
  Administered 2019-03-05: 200 mg via INTRAVENOUS
  Filled 2019-03-05: qty 20

## 2019-03-05 MED ORDER — FAMOTIDINE IN NACL 20-0.9 MG/50ML-% IV SOLN
20.0000 mg | Freq: Once | INTRAVENOUS | Status: AC
  Administered 2019-03-05: 20 mg via INTRAVENOUS

## 2019-03-05 MED ORDER — DIPHENHYDRAMINE HCL 50 MG/ML IJ SOLN
INTRAMUSCULAR | Status: AC
  Filled 2019-03-05: qty 1

## 2019-03-05 MED ORDER — DIPHENHYDRAMINE HCL 50 MG/ML IJ SOLN
50.0000 mg | Freq: Once | INTRAMUSCULAR | Status: AC
  Administered 2019-03-05: 50 mg via INTRAVENOUS

## 2019-03-05 MED ORDER — SODIUM CHLORIDE 0.9 % IV SOLN
45.0000 mg/m2 | Freq: Once | INTRAVENOUS | Status: AC
  Administered 2019-03-05: 90 mg via INTRAVENOUS
  Filled 2019-03-05: qty 15

## 2019-03-05 MED ORDER — SODIUM CHLORIDE 0.9 % IV SOLN
Freq: Once | INTRAVENOUS | Status: AC
  Administered 2019-03-05: 13:00:00 via INTRAVENOUS
  Filled 2019-03-05: qty 250

## 2019-03-05 MED ORDER — PALONOSETRON HCL INJECTION 0.25 MG/5ML
0.2500 mg | Freq: Once | INTRAVENOUS | Status: AC
  Administered 2019-03-05: 0.25 mg via INTRAVENOUS

## 2019-03-05 MED ORDER — FAMOTIDINE IN NACL 20-0.9 MG/50ML-% IV SOLN
INTRAVENOUS | Status: AC
  Filled 2019-03-05: qty 50

## 2019-03-05 MED ORDER — SODIUM CHLORIDE 0.9 % IV SOLN
20.0000 mg | Freq: Once | INTRAVENOUS | Status: AC
  Administered 2019-03-05: 20 mg via INTRAVENOUS
  Filled 2019-03-05: qty 2

## 2019-03-05 MED ORDER — PALONOSETRON HCL INJECTION 0.25 MG/5ML
INTRAVENOUS | Status: AC
  Filled 2019-03-05: qty 5

## 2019-03-05 NOTE — Patient Instructions (Signed)
[facility] Discharge Instructions for Patients Receiving Chemotherapy  Today you received the following chemotherapy agents: Paclitaxel, Carboplatin  To help prevent nausea and vomiting after your treatment, we encourage you to take your nausea medication as directed.   If you develop nausea and vomiting that is not controlled by your nausea medication, call the clinic.   BELOW ARE SYMPTOMS THAT SHOULD BE REPORTED IMMEDIATELY:  *FEVER GREATER THAN 100.5 F  *CHILLS WITH OR WITHOUT FEVER  NAUSEA AND VOMITING THAT IS NOT CONTROLLED WITH YOUR NAUSEA MEDICATION  *UNUSUAL SHORTNESS OF BREATH  *UNUSUAL BRUISING OR BLEEDING  TENDERNESS IN MOUTH AND THROAT WITH OR WITHOUT PRESENCE OF ULCERS  *URINARY PROBLEMS  *BOWEL PROBLEMS  UNUSUAL RASH Items with * indicate a potential emergency and should be followed up as soon as possible.  Feel free to call the clinic should you have any questions or concerns. The clinic phone number is [phone].  Please show the [facility] at check-in to the Emergency Department and triage nurse.  Paclitaxel injection What is this medicine? PACLITAXEL (PAK li TAX el) is a chemotherapy drug. It targets fast dividing cells, like cancer cells, and causes these cells to die. This medicine is used to treat ovarian cancer, breast cancer, lung cancer, Kaposi's sarcoma, and other cancers. This medicine may be used for other purposes; ask your health care provider or pharmacist if you have questions. COMMON BRAND NAME(S): Onxol, Taxol What should I tell my health care provider before I take this medicine? They need to know if you have any of these conditions:  history of irregular heartbeat  liver disease  low blood counts, like low white cell, platelet, or red cell counts  lung or breathing disease, like asthma  tingling of the fingers or toes, or other nerve disorder  an unusual or allergic reaction to paclitaxel, alcohol,  polyoxyethylated castor oil, other chemotherapy, other medicines, foods, dyes, or preservatives  pregnant or trying to get pregnant  breast-feeding How should I use this medicine? This drug is given as an infusion into a vein. It is administered in a hospital or clinic by a specially trained health care professional. Talk to your pediatrician regarding the use of this medicine in children. Special care may be needed. Overdosage: If you think you have taken too much of this medicine contact a poison control center or emergency room at once. NOTE: This medicine is only for you. Do not share this medicine with others. What if I miss a dose? It is important not to miss your dose. Call your doctor or health care professional if you are unable to keep an appointment. What may interact with this medicine? Do not take this medicine with any of the following medications:  disulfiram  metronidazole This medicine may also interact with the following medications:  antiviral medicines for hepatitis, HIV or AIDS  certain antibiotics like erythromycin and clarithromycin  certain medicines for fungal infections like ketoconazole and itraconazole  certain medicines for seizures like carbamazepine, phenobarbital, phenytoin  gemfibrozil  nefazodone  rifampin  St. John's wort This list may not describe all possible interactions. Give your health care provider a list of all the medicines, herbs, non-prescription drugs, or dietary supplements you use. Also tell them if you smoke, drink alcohol, or use illegal drugs. Some items may interact with your medicine. What should I watch for while using this medicine? Your condition will be monitored carefully while you are receiving this medicine. You will need important blood  work done while you are taking this medicine. This medicine can cause serious allergic reactions. To reduce your risk you will need to take other medicine(s) before treatment with this  medicine. If you experience allergic reactions like skin rash, itching or hives, swelling of the face, lips, or tongue, tell your doctor or health care professional right away. In some cases, you may be given additional medicines to help with side effects. Follow all directions for their use. This drug may make you feel generally unwell. This is not uncommon, as chemotherapy can affect healthy cells as well as cancer cells. Report any side effects. Continue your course of treatment even though you feel ill unless your doctor tells you to stop. Call your doctor or health care professional for advice if you get a fever, chills or sore throat, or other symptoms of a cold or flu. Do not treat yourself. This drug decreases your body's ability to fight infections. Try to avoid being around people who are sick. This medicine may increase your risk to bruise or bleed. Call your doctor or health care professional if you notice any unusual bleeding. Be careful brushing and flossing your teeth or using a toothpick because you may get an infection or bleed more easily. If you have any dental work done, tell your dentist you are receiving this medicine. Avoid taking products that contain aspirin, acetaminophen, ibuprofen, naproxen, or ketoprofen unless instructed by your doctor. These medicines may hide a fever. Do not become pregnant while taking this medicine. Women should inform their doctor if they wish to become pregnant or think they might be pregnant. There is a potential for serious side effects to an unborn child. Talk to your health care professional or pharmacist for more information. Do not breast-feed an infant while taking this medicine. Men are advised not to father a child while receiving this medicine. This product may contain alcohol. Ask your pharmacist or healthcare provider if this medicine contains alcohol. Be sure to tell all healthcare providers you are taking this medicine. Certain medicines,  like metronidazole and disulfiram, can cause an unpleasant reaction when taken with alcohol. The reaction includes flushing, headache, nausea, vomiting, sweating, and increased thirst. The reaction can last from 30 minutes to several hours. What side effects may I notice from receiving this medicine? Side effects that you should report to your doctor or health care professional as soon as possible:  allergic reactions like skin rash, itching or hives, swelling of the face, lips, or tongue  breathing problems  changes in vision  fast, irregular heartbeat  high or low blood pressure  mouth sores  pain, tingling, numbness in the hands or feet  signs of decreased platelets or bleeding - bruising, pinpoint red spots on the skin, black, tarry stools, blood in the urine  signs of decreased red blood cells - unusually weak or tired, feeling faint or lightheaded, falls  signs of infection - fever or chills, cough, sore throat, pain or difficulty passing urine  signs and symptoms of liver injury like dark yellow or brown urine; general ill feeling or flu-like symptoms; light-colored stools; loss of appetite; nausea; right upper belly pain; unusually weak or tired; yellowing of the eyes or skin  swelling of the ankles, feet, hands  unusually slow heartbeat Side effects that usually do not require medical attention (report to your doctor or health care professional if they continue or are bothersome):  diarrhea  hair loss  loss of appetite  muscle or joint pain  nausea, vomiting  pain, redness, or irritation at site where injected  tiredness This list may not describe all possible side effects. Call your doctor for medical advice about side effects. You may report side effects to FDA at 1-800-FDA-1088. Where should I keep my medicine? This drug is given in a hospital or clinic and will not be stored at home. NOTE: This sheet is a summary. It may not cover all possible information.  If you have questions about this medicine, talk to your doctor, pharmacist, or health care provider.  2020 Elsevier/Gold Standard (2017-01-11 13:14:55)  Carboplatin injection What is this medicine? CARBOPLATIN (KAR boe pla tin) is a chemotherapy drug. It targets fast dividing cells, like cancer cells, and causes these cells to die. This medicine is used to treat ovarian cancer and many other cancers. This medicine may be used for other purposes; ask your health care provider or pharmacist if you have questions. COMMON BRAND NAME(S): Paraplatin What should I tell my health care provider before I take this medicine? They need to know if you have any of these conditions:  blood disorders  hearing problems  kidney disease  recent or ongoing radiation therapy  an unusual or allergic reaction to carboplatin, cisplatin, other chemotherapy, other medicines, foods, dyes, or preservatives  pregnant or trying to get pregnant  breast-feeding How should I use this medicine? This drug is usually given as an infusion into a vein. It is administered in a hospital or clinic by a specially trained health care professional. Talk to your pediatrician regarding the use of this medicine in children. Special care may be needed. Overdosage: If you think you have taken too much of this medicine contact a poison control center or emergency room at once. NOTE: This medicine is only for you. Do not share this medicine with others. What if I miss a dose? It is important not to miss a dose. Call your doctor or health care professional if you are unable to keep an appointment. What may interact with this medicine?  medicines for seizures  medicines to increase blood counts like filgrastim, pegfilgrastim, sargramostim  some antibiotics like amikacin, gentamicin, neomycin, streptomycin, tobramycin  vaccines Talk to your doctor or health care professional before taking any of these  medicines:  acetaminophen  aspirin  ibuprofen  ketoprofen  naproxen This list may not describe all possible interactions. Give your health care provider a list of all the medicines, herbs, non-prescription drugs, or dietary supplements you use. Also tell them if you smoke, drink alcohol, or use illegal drugs. Some items may interact with your medicine. What should I watch for while using this medicine? Your condition will be monitored carefully while you are receiving this medicine. You will need important blood work done while you are taking this medicine. This drug may make you feel generally unwell. This is not uncommon, as chemotherapy can affect healthy cells as well as cancer cells. Report any side effects. Continue your course of treatment even though you feel ill unless your doctor tells you to stop. In some cases, you may be given additional medicines to help with side effects. Follow all directions for their use. Call your doctor or health care professional for advice if you get a fever, chills or sore throat, or other symptoms of a cold or flu. Do not treat yourself. This drug decreases your body's ability to fight infections. Try to avoid being around people who are sick. This medicine may increase your risk to bruise or bleed.  Call your doctor or health care professional if you notice any unusual bleeding. Be careful brushing and flossing your teeth or using a toothpick because you may get an infection or bleed more easily. If you have any dental work done, tell your dentist you are receiving this medicine. Avoid taking products that contain aspirin, acetaminophen, ibuprofen, naproxen, or ketoprofen unless instructed by your doctor. These medicines may hide a fever. Do not become pregnant while taking this medicine. Women should inform their doctor if they wish to become pregnant or think they might be pregnant. There is a potential for serious side effects to an unborn child. Talk  to your health care professional or pharmacist for more information. Do not breast-feed an infant while taking this medicine. What side effects may I notice from receiving this medicine? Side effects that you should report to your doctor or health care professional as soon as possible:  allergic reactions like skin rash, itching or hives, swelling of the face, lips, or tongue  signs of infection - fever or chills, cough, sore throat, pain or difficulty passing urine  signs of decreased platelets or bleeding - bruising, pinpoint red spots on the skin, black, tarry stools, nosebleeds  signs of decreased red blood cells - unusually weak or tired, fainting spells, lightheadedness  breathing problems  changes in hearing  changes in vision  chest pain  high blood pressure  low blood counts - This drug may decrease the number of white blood cells, red blood cells and platelets. You may be at increased risk for infections and bleeding.  nausea and vomiting  pain, swelling, redness or irritation at the injection site  pain, tingling, numbness in the hands or feet  problems with balance, talking, walking  trouble passing urine or change in the amount of urine Side effects that usually do not require medical attention (report to your doctor or health care professional if they continue or are bothersome):  hair loss  loss of appetite  metallic taste in the mouth or changes in taste This list may not describe all possible side effects. Call your doctor for medical advice about side effects. You may report side effects to FDA at 1-800-FDA-1088. Where should I keep my medicine? This drug is given in a hospital or clinic and will not be stored at home. NOTE: This sheet is a summary. It may not cover all possible information. If you have questions about this medicine, talk to your doctor, pharmacist, or health care provider.  2020 Elsevier/Gold Standard (2007-08-15 14:38:05)

## 2019-03-05 NOTE — Telephone Encounter (Signed)
Patient has been seen in clinic. Nothing further needed at this time.

## 2019-03-06 ENCOUNTER — Ambulatory Visit: Payer: Medicare Other

## 2019-03-06 ENCOUNTER — Ambulatory Visit
Admission: RE | Admit: 2019-03-06 | Discharge: 2019-03-06 | Disposition: A | Discharge status: Home / Self Care | Payer: Medicare Other | Source: Ambulatory Visit | Attending: Radiation Oncology | Admitting: Radiation Oncology

## 2019-03-06 ENCOUNTER — Telehealth: Payer: Self-pay | Admitting: *Deleted

## 2019-03-06 ENCOUNTER — Other Ambulatory Visit: Payer: Self-pay

## 2019-03-06 DIAGNOSIS — C3411 Malignant neoplasm of upper lobe, right bronchus or lung: Secondary | ICD-10-CM | POA: Diagnosis not present

## 2019-03-06 DIAGNOSIS — C3490 Malignant neoplasm of unspecified part of unspecified bronchus or lung: Secondary | ICD-10-CM | POA: Diagnosis not present

## 2019-03-06 DIAGNOSIS — Z510 Encounter for antineoplastic radiation therapy: Secondary | ICD-10-CM | POA: Diagnosis not present

## 2019-03-06 NOTE — Telephone Encounter (Signed)
Called pt to see how he did with his treatment yesterday & he states he feels good & most of his symptoms have decreased or subsided.  He reports cough is gone, phlegm/blood has lessened, tightness in chest gone, slept better, more energy, appetite is back, more mentally alert, mouth dryness gone.  Suggested that the steroids may have helped with some of these.  He stated he knows when to call & how to reach [place] with any problems/concerns. He asked if he could change his chemo time on 03/18/19.  Informed message will be sent to schedulers to call him.

## 2019-03-06 NOTE — Telephone Encounter (Signed)
-----   Message from Scot Dock, RN sent at 03/05/2019  3:59 PM EDT ----- Regarding: Dr. Julien Nordmann 1st time chemo follow up - tolerated well Thank you

## 2019-03-07 ENCOUNTER — Ambulatory Visit
Admission: RE | Admit: 2019-03-07 | Discharge: 2019-03-07 | Disposition: A | Discharge status: Home / Self Care | Payer: Medicare Other | Source: Ambulatory Visit | Attending: Radiation Oncology | Admitting: Radiation Oncology

## 2019-03-07 ENCOUNTER — Other Ambulatory Visit: Payer: Self-pay

## 2019-03-07 DIAGNOSIS — Z510 Encounter for antineoplastic radiation therapy: Secondary | ICD-10-CM | POA: Diagnosis not present

## 2019-03-07 DIAGNOSIS — C3411 Malignant neoplasm of upper lobe, right bronchus or lung: Secondary | ICD-10-CM | POA: Diagnosis not present

## 2019-03-07 DIAGNOSIS — C3490 Malignant neoplasm of unspecified part of unspecified bronchus or lung: Secondary | ICD-10-CM | POA: Diagnosis not present

## 2019-03-07 DIAGNOSIS — C3491 Malignant neoplasm of unspecified part of right bronchus or lung: Secondary | ICD-10-CM | POA: Diagnosis not present

## 2019-03-08 ENCOUNTER — Other Ambulatory Visit: Payer: Self-pay

## 2019-03-08 ENCOUNTER — Other Ambulatory Visit: Payer: Self-pay | Admitting: Internal Medicine

## 2019-03-08 ENCOUNTER — Encounter: Payer: Self-pay | Admitting: *Deleted

## 2019-03-08 ENCOUNTER — Encounter: Payer: Self-pay | Admitting: Internal Medicine

## 2019-03-08 ENCOUNTER — Ambulatory Visit
Admission: RE | Admit: 2019-03-08 | Discharge: 2019-03-08 | Disposition: A | Discharge status: Home / Self Care | Payer: Medicare Other | Source: Ambulatory Visit | Attending: Radiation Oncology | Admitting: Radiation Oncology

## 2019-03-08 ENCOUNTER — Other Ambulatory Visit: Payer: Self-pay | Admitting: *Deleted

## 2019-03-08 DIAGNOSIS — C3490 Malignant neoplasm of unspecified part of unspecified bronchus or lung: Secondary | ICD-10-CM | POA: Diagnosis not present

## 2019-03-08 DIAGNOSIS — Z510 Encounter for antineoplastic radiation therapy: Secondary | ICD-10-CM | POA: Diagnosis not present

## 2019-03-08 DIAGNOSIS — C3411 Malignant neoplasm of upper lobe, right bronchus or lung: Secondary | ICD-10-CM | POA: Diagnosis not present

## 2019-03-08 NOTE — Progress Notes (Signed)
Oncology Nurse Navigator Documentation  Oncology Nurse Navigator Flowsheets 03/08/2019  Diagnosis Status Confirmed Diagnosis Complete  Phase of Treatment Chemo/Radiation Concurrent  Navigator Follow Up Date: 03/13/2019  Navigator Follow Up Reason: Appointment Review  Navigator Location [facility]  Referral Date to RadOnc/MedOnc -  Navigator Encounter Type Other/per Dr. Julien Nordmann I requested PDL 1 testing on recent tissue DX ([id number]) from pathology dept  Abnormal Finding Date 02/07/2019  Confirmed Diagnosis Date 02/20/2019  Treatment Initiated Date 03/05/2019  Treatment Phase -  Barriers/Navigation Needs Coordination of Care  Education -  Interventions Coordination of Care  Coordination of Care Other  Education Method -  Support Groups/Services -  Acuity Level 2-Minimal Needs (1-2 Barriers Identified)  Acuity Level 2 -  Time Spent with Patient 30

## 2019-03-08 NOTE — Progress Notes (Signed)
The proposed treatment discussed in cancer conference 03/08/19 is for discussion purpose only and is not a binding recommendation.  The patient was not physically examined nor present for their treatment options.  Therefore, final treatment plans cannot be decided.

## 2019-03-08 NOTE — Progress Notes (Signed)
Spoke w/ pt to introduce myself as his Arboriculturist.  Unfortunately there aren't any foundations offering copay assistance for his Dx and the type of ins he has.  I offered the [place] and went over what it covers.  He declined at this time.

## 2019-03-09 ENCOUNTER — Other Ambulatory Visit: Payer: Self-pay

## 2019-03-09 ENCOUNTER — Ambulatory Visit
Admission: RE | Admit: 2019-03-09 | Discharge: 2019-03-09 | Disposition: A | Discharge status: Home / Self Care | Payer: Medicare Other | Source: Ambulatory Visit | Attending: Radiation Oncology | Admitting: Radiation Oncology

## 2019-03-09 ENCOUNTER — Other Ambulatory Visit: Payer: Self-pay | Admitting: Physician Assistant

## 2019-03-09 DIAGNOSIS — C3411 Malignant neoplasm of upper lobe, right bronchus or lung: Secondary | ICD-10-CM | POA: Diagnosis not present

## 2019-03-09 DIAGNOSIS — C3490 Malignant neoplasm of unspecified part of unspecified bronchus or lung: Secondary | ICD-10-CM | POA: Diagnosis not present

## 2019-03-09 DIAGNOSIS — C3491 Malignant neoplasm of unspecified part of right bronchus or lung: Secondary | ICD-10-CM

## 2019-03-09 DIAGNOSIS — Z510 Encounter for antineoplastic radiation therapy: Secondary | ICD-10-CM | POA: Diagnosis not present

## 2019-03-12 ENCOUNTER — Inpatient Hospital Stay: Payer: Medicare Other

## 2019-03-12 ENCOUNTER — Other Ambulatory Visit: Payer: Self-pay

## 2019-03-12 ENCOUNTER — Encounter: Payer: Self-pay | Admitting: Physician Assistant

## 2019-03-12 ENCOUNTER — Inpatient Hospital Stay: Payer: Medicare Other | Admitting: Physician Assistant

## 2019-03-12 ENCOUNTER — Ambulatory Visit: Payer: Medicare Other

## 2019-03-12 ENCOUNTER — Ambulatory Visit
Admission: RE | Admit: 2019-03-12 | Discharge: 2019-03-12 | Disposition: A | Discharge status: Home / Self Care | Payer: Medicare Other | Source: Ambulatory Visit | Attending: Radiation Oncology | Admitting: Radiation Oncology

## 2019-03-12 VITALS — BP 124/73 | HR 93 | Temp 98.2°F | Resp 18 | Ht 67.0 in | Wt 198.7 lb

## 2019-03-12 DIAGNOSIS — C3491 Malignant neoplasm of unspecified part of right bronchus or lung: Secondary | ICD-10-CM

## 2019-03-12 DIAGNOSIS — E041 Nontoxic single thyroid nodule: Secondary | ICD-10-CM | POA: Diagnosis not present

## 2019-03-12 DIAGNOSIS — Z5111 Encounter for antineoplastic chemotherapy: Secondary | ICD-10-CM | POA: Diagnosis not present

## 2019-03-12 DIAGNOSIS — C3490 Malignant neoplasm of unspecified part of unspecified bronchus or lung: Secondary | ICD-10-CM | POA: Diagnosis not present

## 2019-03-12 DIAGNOSIS — R042 Hemoptysis: Secondary | ICD-10-CM | POA: Diagnosis not present

## 2019-03-12 DIAGNOSIS — Z79899 Other long term (current) drug therapy: Secondary | ICD-10-CM | POA: Diagnosis not present

## 2019-03-12 DIAGNOSIS — Z510 Encounter for antineoplastic radiation therapy: Secondary | ICD-10-CM | POA: Diagnosis not present

## 2019-03-12 DIAGNOSIS — C3411 Malignant neoplasm of upper lobe, right bronchus or lung: Secondary | ICD-10-CM | POA: Diagnosis not present

## 2019-03-12 DIAGNOSIS — I10 Essential (primary) hypertension: Secondary | ICD-10-CM | POA: Diagnosis not present

## 2019-03-12 DIAGNOSIS — Z7982 Long term (current) use of aspirin: Secondary | ICD-10-CM | POA: Diagnosis not present

## 2019-03-12 LAB — CBC WITH DIFFERENTIAL (CANCER CENTER ONLY)
Abs Immature Granulocytes: 0.05 10*3/uL (ref 0.00–0.07)
Basophils Absolute: 0 10*3/uL (ref 0.0–0.1)
Basophils Relative: 0 %
Eosinophils Absolute: 0.3 10*3/uL (ref 0.0–0.5)
Eosinophils Relative: 4 %
HCT: 42.3 % (ref 39.0–52.0)
Hemoglobin: 13.5 g/dL (ref 13.0–17.0)
Immature Granulocytes: 1 %
Lymphocytes Relative: 16 %
Lymphs Abs: 1.1 10*3/uL (ref 0.7–4.0)
MCH: 27.1 pg (ref 26.0–34.0)
MCHC: 31.9 g/dL (ref 30.0–36.0)
MCV: 84.8 fL (ref 80.0–100.0)
Monocytes Absolute: 0.6 10*3/uL (ref 0.1–1.0)
Monocytes Relative: 8 %
Neutro Abs: 5 10*3/uL (ref 1.7–7.7)
Neutrophils Relative %: 71 %
Platelet Count: 424 10*3/uL — ABNORMAL HIGH (ref 150–400)
RBC: 4.99 MIL/uL (ref 4.22–5.81)
RDW: 13.6 % (ref 11.5–15.5)
WBC Count: 7.1 10*3/uL (ref 4.0–10.5)
nRBC: 0 % (ref 0.0–0.2)

## 2019-03-12 LAB — CMP (CANCER CENTER ONLY)
ALT: 22 U/L (ref 0–44)
AST: 14 U/L — ABNORMAL LOW (ref 15–41)
Albumin: 3.4 g/dL — ABNORMAL LOW (ref 3.5–5.0)
Alkaline Phosphatase: 78 U/L (ref 38–126)
Anion gap: 13 (ref 5–15)
BUN: 11 mg/dL (ref 8–23)
CO2: 23 mmol/L (ref 22–32)
Calcium: 8.9 mg/dL (ref 8.9–10.3)
Chloride: 103 mmol/L (ref 98–111)
Creatinine: 1.03 mg/dL (ref 0.61–1.24)
GFR, Est AFR Am: >60 mL/min (ref >60)
GFR, Estimated: >60 mL/min (ref >60)
Glucose, Bld: 151 mg/dL — ABNORMAL HIGH (ref 70–99)
Potassium: 4 mmol/L (ref 3.5–5.1)
Sodium: 139 mmol/L (ref 135–145)
Total Bilirubin: 0.5 mg/dL (ref 0.3–1.2)
Total Protein: 7.2 g/dL (ref 6.5–8.1)

## 2019-03-12 MED ORDER — DIPHENHYDRAMINE HCL 50 MG/ML IJ SOLN
50.0000 mg | Freq: Once | INTRAMUSCULAR | Status: AC
  Administered 2019-03-12: 09:00:00 50 mg via INTRAVENOUS

## 2019-03-12 MED ORDER — SODIUM CHLORIDE 0.9 % IV SOLN
Freq: Once | INTRAVENOUS | Status: AC
  Administered 2019-03-12: 09:00:00 via INTRAVENOUS
  Filled 2019-03-12: qty 250

## 2019-03-12 MED ORDER — FAMOTIDINE IN NACL 20-0.9 MG/50ML-% IV SOLN
INTRAVENOUS | Status: AC
  Filled 2019-03-12: qty 50

## 2019-03-12 MED ORDER — FAMOTIDINE IN NACL 20-0.9 MG/50ML-% IV SOLN
20.0000 mg | Freq: Once | INTRAVENOUS | Status: AC
  Administered 2019-03-12: 20 mg via INTRAVENOUS

## 2019-03-12 MED ORDER — SODIUM CHLORIDE 0.9 % IV SOLN
45.0000 mg/m2 | Freq: Once | INTRAVENOUS | Status: AC
  Administered 2019-03-12: 10:00:00 90 mg via INTRAVENOUS
  Filled 2019-03-12: qty 15

## 2019-03-12 MED ORDER — SODIUM CHLORIDE 0.9 % IV SOLN
204.6000 mg | Freq: Once | INTRAVENOUS | Status: AC
  Administered 2019-03-12: 12:00:00 200 mg via INTRAVENOUS
  Filled 2019-03-12: qty 20

## 2019-03-12 MED ORDER — SODIUM CHLORIDE 0.9 % IV SOLN
20.0000 mg | Freq: Once | INTRAVENOUS | Status: AC
  Administered 2019-03-12: 09:00:00 20 mg via INTRAVENOUS
  Filled 2019-03-12: qty 2

## 2019-03-12 MED ORDER — PALONOSETRON HCL INJECTION 0.25 MG/5ML
0.2500 mg | Freq: Once | INTRAVENOUS | Status: AC
  Administered 2019-03-12: 09:00:00 0.25 mg via INTRAVENOUS

## 2019-03-12 MED ORDER — PALONOSETRON HCL INJECTION 0.25 MG/5ML
INTRAVENOUS | Status: AC
  Filled 2019-03-12: qty 5

## 2019-03-12 MED ORDER — DIPHENHYDRAMINE HCL 50 MG/ML IJ SOLN
INTRAMUSCULAR | Status: AC
  Filled 2019-03-12: qty 1

## 2019-03-12 NOTE — Patient Instructions (Signed)
[facility] Discharge Instructions for Patients Receiving Chemotherapy  Today you received the following chemotherapy agents: Paclitaxel, Carboplatin  To help prevent nausea and vomiting after your treatment, we encourage you to take your nausea medication as directed.   If you develop nausea and vomiting that is not controlled by your nausea medication, call the clinic.   BELOW ARE SYMPTOMS THAT SHOULD BE REPORTED IMMEDIATELY:  *FEVER GREATER THAN 100.5 F  *CHILLS WITH OR WITHOUT FEVER  NAUSEA AND VOMITING THAT IS NOT CONTROLLED WITH YOUR NAUSEA MEDICATION  *UNUSUAL SHORTNESS OF BREATH  *UNUSUAL BRUISING OR BLEEDING  TENDERNESS IN MOUTH AND THROAT WITH OR WITHOUT PRESENCE OF ULCERS  *URINARY PROBLEMS  *BOWEL PROBLEMS  UNUSUAL RASH Items with * indicate a potential emergency and should be followed up as soon as possible.  Feel free to call the clinic should you have any questions or concerns. The clinic phone number is [phone].  Please show the [facility] at check-in to the Emergency Department and triage nurse.  Paclitaxel injection What is this medicine? PACLITAXEL (PAK li TAX el) is a chemotherapy drug. It targets fast dividing cells, like cancer cells, and causes these cells to die. This medicine is used to treat ovarian cancer, breast cancer, lung cancer, Kaposi's sarcoma, and other cancers. This medicine may be used for other purposes; ask your health care provider or pharmacist if you have questions. COMMON BRAND NAME(S): Onxol, Taxol What should I tell my health care provider before I take this medicine? They need to know if you have any of these conditions:  history of irregular heartbeat  liver disease  low blood counts, like low white cell, platelet, or red cell counts  lung or breathing disease, like asthma  tingling of the fingers or toes, or other nerve disorder  an unusual or allergic reaction to paclitaxel, alcohol,  polyoxyethylated castor oil, other chemotherapy, other medicines, foods, dyes, or preservatives  pregnant or trying to get pregnant  breast-feeding How should I use this medicine? This drug is given as an infusion into a vein. It is administered in a hospital or clinic by a specially trained health care professional. Talk to your pediatrician regarding the use of this medicine in children. Special care may be needed. Overdosage: If you think you have taken too much of this medicine contact a poison control center or emergency room at once. NOTE: This medicine is only for you. Do not share this medicine with others. What if I miss a dose? It is important not to miss your dose. Call your doctor or health care professional if you are unable to keep an appointment. What may interact with this medicine? Do not take this medicine with any of the following medications:  disulfiram  metronidazole This medicine may also interact with the following medications:  antiviral medicines for hepatitis, HIV or AIDS  certain antibiotics like erythromycin and clarithromycin  certain medicines for fungal infections like ketoconazole and itraconazole  certain medicines for seizures like carbamazepine, phenobarbital, phenytoin  gemfibrozil  nefazodone  rifampin  St. John's wort This list may not describe all possible interactions. Give your health care provider a list of all the medicines, herbs, non-prescription drugs, or dietary supplements you use. Also tell them if you smoke, drink alcohol, or use illegal drugs. Some items may interact with your medicine. What should I watch for while using this medicine? Your condition will be monitored carefully while you are receiving this medicine. You will need important blood  work done while you are taking this medicine. This medicine can cause serious allergic reactions. To reduce your risk you will need to take other medicine(s) before treatment with this  medicine. If you experience allergic reactions like skin rash, itching or hives, swelling of the face, lips, or tongue, tell your doctor or health care professional right away. In some cases, you may be given additional medicines to help with side effects. Follow all directions for their use. This drug may make you feel generally unwell. This is not uncommon, as chemotherapy can affect healthy cells as well as cancer cells. Report any side effects. Continue your course of treatment even though you feel ill unless your doctor tells you to stop. Call your doctor or health care professional for advice if you get a fever, chills or sore throat, or other symptoms of a cold or flu. Do not treat yourself. This drug decreases your body's ability to fight infections. Try to avoid being around people who are sick. This medicine may increase your risk to bruise or bleed. Call your doctor or health care professional if you notice any unusual bleeding. Be careful brushing and flossing your teeth or using a toothpick because you may get an infection or bleed more easily. If you have any dental work done, tell your dentist you are receiving this medicine. Avoid taking products that contain aspirin, acetaminophen, ibuprofen, naproxen, or ketoprofen unless instructed by your doctor. These medicines may hide a fever. Do not become pregnant while taking this medicine. Women should inform their doctor if they wish to become pregnant or think they might be pregnant. There is a potential for serious side effects to an unborn child. Talk to your health care professional or pharmacist for more information. Do not breast-feed an infant while taking this medicine. Men are advised not to father a child while receiving this medicine. This product may contain alcohol. Ask your pharmacist or healthcare provider if this medicine contains alcohol. Be sure to tell all healthcare providers you are taking this medicine. Certain medicines,  like metronidazole and disulfiram, can cause an unpleasant reaction when taken with alcohol. The reaction includes flushing, headache, nausea, vomiting, sweating, and increased thirst. The reaction can last from 30 minutes to several hours. What side effects may I notice from receiving this medicine? Side effects that you should report to your doctor or health care professional as soon as possible:  allergic reactions like skin rash, itching or hives, swelling of the face, lips, or tongue  breathing problems  changes in vision  fast, irregular heartbeat  high or low blood pressure  mouth sores  pain, tingling, numbness in the hands or feet  signs of decreased platelets or bleeding - bruising, pinpoint red spots on the skin, black, tarry stools, blood in the urine  signs of decreased red blood cells - unusually weak or tired, feeling faint or lightheaded, falls  signs of infection - fever or chills, cough, sore throat, pain or difficulty passing urine  signs and symptoms of liver injury like dark yellow or brown urine; general ill feeling or flu-like symptoms; light-colored stools; loss of appetite; nausea; right upper belly pain; unusually weak or tired; yellowing of the eyes or skin  swelling of the ankles, feet, hands  unusually slow heartbeat Side effects that usually do not require medical attention (report to your doctor or health care professional if they continue or are bothersome):  diarrhea  hair loss  loss of appetite  muscle or joint pain  nausea, vomiting  pain, redness, or irritation at site where injected  tiredness This list may not describe all possible side effects. Call your doctor for medical advice about side effects. You may report side effects to FDA at 1-800-FDA-1088. Where should I keep my medicine? This drug is given in a hospital or clinic and will not be stored at home. NOTE: This sheet is a summary. It may not cover all possible information.  If you have questions about this medicine, talk to your doctor, pharmacist, or health care provider.  2020 [employer]/Gold Standard (2017-01-11 13:14:55)  Carboplatin injection What is this medicine? CARBOPLATIN (KAR boe pla tin) is a chemotherapy drug. It targets fast dividing cells, like cancer cells, and causes these cells to die. This medicine is used to treat ovarian cancer and many other cancers. This medicine may be used for other purposes; ask your health care provider or pharmacist if you have questions. COMMON BRAND NAME(S): Paraplatin What should I tell my health care provider before I take this medicine? They need to know if you have any of these conditions:  blood disorders  hearing problems  kidney disease  recent or ongoing radiation therapy  an unusual or allergic reaction to carboplatin, cisplatin, other chemotherapy, other medicines, foods, dyes, or preservatives  pregnant or trying to get pregnant  breast-feeding How should I use this medicine? This drug is usually given as an infusion into a vein. It is administered in a hospital or clinic by a specially trained health care professional. Talk to your pediatrician regarding the use of this medicine in children. Special care may be needed. Overdosage: If you think you have taken too much of this medicine contact a poison control center or emergency room at once. NOTE: This medicine is only for you. Do not share this medicine with others. What if I miss a dose? It is important not to miss a dose. Call your doctor or health care professional if you are unable to keep an appointment. What may interact with this medicine?  medicines for seizures  medicines to increase blood counts like filgrastim, pegfilgrastim, sargramostim  some antibiotics like amikacin, gentamicin, neomycin, streptomycin, tobramycin  vaccines Talk to your doctor or health care professional before taking any of these  medicines:  acetaminophen  aspirin  ibuprofen  ketoprofen  naproxen This list may not describe all possible interactions. Give your health care provider a list of all the medicines, herbs, non-prescription drugs, or dietary supplements you use. Also tell them if you smoke, drink alcohol, or use illegal drugs. Some items may interact with your medicine. What should I watch for while using this medicine? Your condition will be monitored carefully while you are receiving this medicine. You will need important blood work done while you are taking this medicine. This drug may make you feel generally unwell. This is not uncommon, as chemotherapy can affect healthy cells as well as cancer cells. Report any side effects. Continue your course of treatment even though you feel ill unless your doctor tells you to stop. In some cases, you may be given additional medicines to help with side effects. Follow all directions for their use. Call your doctor or health care professional for advice if you get a fever, chills or sore throat, or other symptoms of a cold or flu. Do not treat yourself. This drug decreases your body's ability to fight infections. Try to avoid being around people who are sick. This medicine may increase your risk to bruise or bleed.  Call your doctor or health care professional if you notice any unusual bleeding. Be careful brushing and flossing your teeth or using a toothpick because you may get an infection or bleed more easily. If you have any dental work done, tell your dentist you are receiving this medicine. Avoid taking products that contain aspirin, acetaminophen, ibuprofen, naproxen, or ketoprofen unless instructed by your doctor. These medicines may hide a fever. Do not become pregnant while taking this medicine. Women should inform their doctor if they wish to become pregnant or think they might be pregnant. There is a potential for serious side effects to an unborn child. Talk  to your health care professional or pharmacist for more information. Do not breast-feed an infant while taking this medicine. What side effects may I notice from receiving this medicine? Side effects that you should report to your doctor or health care professional as soon as possible:  allergic reactions like skin rash, itching or hives, swelling of the face, lips, or tongue  signs of infection - fever or chills, cough, sore throat, pain or difficulty passing urine  signs of decreased platelets or bleeding - bruising, pinpoint red spots on the skin, black, tarry stools, nosebleeds  signs of decreased red blood cells - unusually weak or tired, fainting spells, lightheadedness  breathing problems  changes in hearing  changes in vision  chest pain  high blood pressure  low blood counts - This drug may decrease the number of white blood cells, red blood cells and platelets. You may be at increased risk for infections and bleeding.  nausea and vomiting  pain, swelling, redness or irritation at the injection site  pain, tingling, numbness in the hands or feet  problems with balance, talking, walking  trouble passing urine or change in the amount of urine Side effects that usually do not require medical attention (report to your doctor or health care professional if they continue or are bothersome):  hair loss  loss of appetite  metallic taste in the mouth or changes in taste This list may not describe all possible side effects. Call your doctor for medical advice about side effects. You may report side effects to FDA at 1-800-FDA-1088. Where should I keep my medicine? This drug is given in a hospital or clinic and will not be stored at home. NOTE: This sheet is a summary. It may not cover all possible information. If you have questions about this medicine, talk to your doctor, pharmacist, or health care provider.  2020 [employer]/Gold Standard (2007-08-15 14:38:05)

## 2019-03-12 NOTE — Progress Notes (Signed)
[facility] OFFICE PROGRESS NOTE  [name], MD [address] [place] [address]  DIAGNOSIS: Stage IIIA (T2b, N2, M0) non-small cell lung cancer likely adenocarcinoma pending final tissue diagnosis presented with right upper lobe lung mass in addition to right hilar and mediastinal lymphadenopathy diagnosed in September 2020  Molecular Biomarkers:  [id number] Everolimus, Temsirolimus Yes 4.9% [id number] None  Yes 4.7% [id number]  Yes 0.2% [id number] None  No 5.2% [id number] None  No 5.0% [id number] None (VUS)  No (VUS) 4.4% [id number] (VUS)  No (VUS) 3.5% MSI-High NOT DETECTED  PRIOR THERAPY: None  CURRENT THERAPY: Concurrent chemoradiation with weekly carboplatin for AUC of 2 and paclitaxel 45 mg/M2.  First dose March 05, 2019. Status post 1 cycle.   INTERVAL HISTORY: Carl GRIFFITH89y.[id number]. male returns to the clinic for a follow up visit. The patient is feeling fair today without any concerning complaints except for fatigue and shortness of breath with exertion. He also endorses phlegm. He has had hemoptysis in the past. He states that it has "pretty much stopped" but he occasionally has a little bit of blood mixed in his phlegm. He tolerated his first cycle of chemotherapy well without any adverse side effects.  He denied any fever, chills, night sweats, or weight loss.  He denied any nausea, vomiting, or constipation.  The patient states that he has "never had solid stools but that has been going on for a while" and no changes in his bowel habits from baseline.  He denies any headache or visual changes.  He denies any chest pain.  His last radiation treatment is scheduled for 04/18/2019.  He is here today for evaluation before starting cycle #2.   MEDICAL HISTORY: Past Medical History:  Diagnosis Date  . ALLERGIC RHINITIS 01/30/2007  . Arthritis    "hands" (05/29/2014)  . [place], [place]  .  Constipation   . Coronary artery disease    LHC (05/29/14): pLAD 50-70, pRCA 99 (L>R collats), EF 55% >> PCI: 3.5 x 28 mm Xience Alpine DES to [employer] . Cough secondary to angiotensin converting enzyme inhibitor (ACE-I) 05/28/2013  . ED (erectile dysfunction)   . GERD 09/26/2007  . HYPERLIPIDEMIA 09/26/2007  . Hypertension   . PLANTAR FASCIITIS, BILATERAL 07/08/2009  . Unspecified hearing loss 06/16/2009   wears aides both ears    ALLERGIES:  is allergic to lisinopril.  MEDICATIONS:  Current Outpatient Medications  Medication Sig Dispense Refill  . benzonatate (TESSALON) 100 MG capsule Take 1 capsule (100 mg total) by mouth 2 (two) times daily as needed for cough. 20 capsule 0  . cetirizine (ZYRTEC) 10 MG tablet Take 1 tablet (10 mg total) by mouth daily. 30 tablet 11  . Cyanocobalamin (VITAMIN B-12 CR PO) Take by mouth daily.    .Marland Kitchenezetimibe (ZETIA) 10 MG tablet TAKE 1 TABLET BY MOUTH EVERY DAY (Patient taking differently: Take 10 mg by mouth daily. ) 90 tablet 3  . fluticasone (FLONASE) 50 MCG/ACT nasal spray SHAKE LIQUID AND USE 1 SPRAY IN EACH NOSTRIL DAILY (Patient taking differently: Place 1 spray into both nostrils daily. ) 48 g 0  . hydrochlorothiazide (MICROZIDE) 12.5 MG capsule Take 1 capsule (12.5 mg total) by mouth daily. 90 capsule 3  . losartan (COZAAR) 50 MG tablet Take 1 tablet (50 mg total) by mouth daily. 90 tablet 3  . metoprolol tartrate (LOPRESSOR) 25 MG tablet TAKE 1 TABLET BY MOUTH  TWICE DAILY (  Patient taking differently: Take 25 mg by mouth 2 (two) times daily. ) 180 tablet 1  . Multiple Vitamin (MULTIVITAMIN WITH MINERALS) TABS tablet Take 1 tablet by mouth daily.    Marland Kitchen omeprazole (PRILOSEC) 40 MG capsule TAKE 1 CAPSULE(40 MG) BY MOUTH DAILY (Patient taking differently: Take 40 mg by mouth daily. ) 90 capsule 1  . simvastatin (ZOCOR) 40 MG tablet TAKE 1 TABLET(40 MG) BY MOUTH DAILY AT 6 PM (Patient taking differently: Take 40 mg by mouth daily at 6 PM. ) 90 tablet 3  .  tamsulosin (FLOMAX) 0.4 MG CAPS capsule Take 0.4 mg by mouth daily.    Marland Kitchen NITROSTAT 0.4 MG SL tablet PLACE 1 TABLET BY MOUTH UNDER TONGUE EVERY 5 MINUTES AS NEEDED FOR CHEST PAIN (Patient not taking: No sig reported) 25 tablet 12  . prochlorperazine (COMPAZINE) 10 MG tablet Take 1 tablet (10 mg total) by mouth every 6 (six) hours as needed for nausea or vomiting. (Patient not taking: Reported on 03/12/2019) 30 tablet 0   No current facility-administered medications for this visit.    Facility-Administered Medications Ordered in Other Visits  Medication Dose Route Frequency Provider Last Rate Last Dose  . CARBOplatin (PARAPLATIN) 200 mg in sodium chloride 0.9 % 100 mL chemo infusion  200 mg Intravenous Once Curt Bears, MD      . dexamethasone (DECADRON) 20 mg in sodium chloride 0.9 % 50 mL IVPB  20 mg Intravenous Once Curt Bears, MD      . diphenhydrAMINE (BENADRYL) injection 50 mg  50 mg Intravenous Once Curt Bears, MD      . famotidine (PEPCID) IVPB 20 mg premix  20 mg Intravenous Once Curt Bears, MD      . PACLitaxel (TAXOL) 90 mg in sodium chloride 0.9 % 250 mL chemo infusion (</= '80mg'$ /m2)  45 mg/m2 (Treatment Plan Recorded) Intravenous Once Curt Bears, MD      . palonosetron (ALOXI) injection 0.25 mg  0.25 mg Intravenous Once Curt Bears, MD        SURGICAL HISTORY:  Past Surgical History:  Procedure Laterality Date  . CARDIAC CATHETERIZATION  05/29/2014   Procedure: CORONARY STENT INTERVENTION;  Surgeon: Sinclair Grooms, MD;  Location: [facility] CATH LAB;  Service: Cardiovascular;;  Prox RCA  . COLONOSCOPY    . CORONARY ANGIOPLASTY WITH STENT PLACEMENT  05/29/2014   "1"  . KNEE ARTHROSCOPY Right ~ 2005  . LEFT HEART CATHETERIZATION WITH CORONARY ANGIOGRAM N/A 05/29/2014   Procedure: LEFT HEART CATHETERIZATION WITH CORONARY ANGIOGRAM;  Surgeon: Sinclair Grooms, MD;  Location: [facility] CATH LAB;  Service: Cardiovascular;  Laterality: N/A;  . VIDEO BRONCHOSCOPY WITH  ENDOBRONCHIAL ULTRASOUND N/A 02/20/2019   Procedure: VIDEO BRONCHOSCOPY WITH ENDOBRONCHIAL ULTRASOUND;  Surgeon: Garner Nash, DO;  Location: [facility] OR;  Service: Thoracic;  Laterality: N/A;    REVIEW OF SYSTEMS:   Review of Systems  Constitutional: Positive for fatigue. Negative for appetite change, chills, fever and unexpected weight change.  HENT: Negative for mouth sores, nosebleeds, sore throat and trouble swallowing.   Eyes: Negative for eye problems and icterus.  Respiratory: Positive for productive cough, shortness of breath with exertion, and improving hemoptysis. Negative for wheezing.   Cardiovascular: Negative for chest pain and leg swelling.  Gastrointestinal: Negative for abdominal pain, constipation, diarrhea, nausea and vomiting.  Genitourinary: Negative for bladder incontinence, difficulty urinating, dysuria, frequency and hematuria.   Musculoskeletal: Negative for back pain, gait problem, neck pain and neck stiffness.  Skin: Negative for itching  and rash.  Neurological: Negative for dizziness, extremity weakness, gait problem, headaches, light-headedness and seizures.  Hematological: Negative for adenopathy. Does not bruise/bleed easily.  Psychiatric/Behavioral: Negative for confusion, depression and sleep disturbance. The patient is not nervous/anxious.     PHYSICAL EXAMINATION:  Blood pressure 124/73, pulse 93, temperature 98.2 F (36.8 C), temperature source Temporal, resp. rate 18, height '5\' 7"'$  (1.702 m), weight 198 lb 11.2 oz (90.1 kg), SpO2 97 %.  ECOG PERFORMANCE STATUS: 1 - Symptomatic but completely ambulatory  Physical Exam  Constitutional: Oriented to person, place, and time and well-developed, well-nourished, and in no distress. HENT:  Head: Normocephalic and atraumatic.  Mouth/Throat: Oropharynx is clear and moist. No oropharyngeal exudate.  Eyes: Conjunctivae are normal. Right eye exhibits no discharge. Left eye exhibits no discharge. No scleral icterus.   Neck: Normal range of motion. Neck supple.  Cardiovascular: Normal rate, regular rhythm, normal heart sounds and intact distal pulses.   Pulmonary/Chest: Effort normal and breath sounds normal. No respiratory distress. No wheezes. No rales.  Abdominal: Soft. Bowel sounds are normal. Exhibits no distension and no mass. There is no tenderness.  Musculoskeletal: Normal range of motion. Exhibits no edema.  Lymphadenopathy:    No cervical adenopathy.  Neurological: Alert and oriented to person, place, and time. Exhibits normal muscle tone. Gait normal. Coordination normal.  Skin: Skin is warm and dry. No rash noted. Not diaphoretic. No erythema. No pallor.  Psychiatric: Mood, memory and judgment normal.  Vitals reviewed.  LABORATORY DATA: Lab Results  Component Value Date   WBC 7.1 03/12/2019   HGB 13.5 03/12/2019   HCT 42.3 03/12/2019   MCV 84.8 03/12/2019   PLT 424 (H) 03/12/2019      Chemistry      Component Value Date/Time   NA 139 03/12/2019 0805   NA 141 08/05/2016 0826   K 4.0 03/12/2019 0805   CL 103 03/12/2019 0805   CO2 23 03/12/2019 0805   BUN 11 03/12/2019 0805   BUN 14 08/05/2016 0826   CREATININE 1.03 03/12/2019 0805      Component Value Date/Time   CALCIUM 8.9 03/12/2019 0805   ALKPHOS 78 03/12/2019 0805   AST 14 (L) 03/12/2019 0805   ALT 22 03/12/2019 0805   BILITOT 0.5 03/12/2019 0805       RADIOGRAPHIC STUDIES:  Mr Jeri Cos OE Contrast  Result Date: 02/22/2019 CLINICAL DATA:  Malignant neoplasm of unspecified part of unspecified bronchus or lung. Lung cancer, non-small cell, staging EXAM: MRI HEAD WITHOUT AND WITH CONTRAST TECHNIQUE: Multiplanar, multiecho pulse sequences of the brain and surrounding structures were obtained without and with intravenous contrast. CONTRAST:  18m GADAVIST GADOBUTROL 1 MMOL/ML IV SOLN COMPARISON:  Prior MRI/MRA brain/head 01/21/2001 and 03/24/2001 FINDINGS: Brain: Multiple sequences are mildly motion degraded, including  post-contrast imaging. There is no evidence of acute infarct. No evidence of intracranial mass. No midline shift or extra-axial fluid collection. No chronic intracranial blood products. Cerebral volume is normal for age. No abnormal intracranial enhancement is identified. Vascular: Flow voids maintained within the proximal large arterial vessels. Non dominant distal right vertebral artery. Skull and upper cervical spine: No focal marrow lesion. Sinuses/Orbits: Visualized orbits demonstrate no acute abnormality. Mild scattered paranasal sinus mucosal thickening. No significant mastoid effusion. IMPRESSION: 1. Motion degraded examination. 2. No evidence of intracranial metastatic disease. 3. Normal MRI appearance of the brain for age. Electronically Signed   By: KKellie Simmering  On: 02/22/2019 13:42   Nm Pet Image Initial (pi) Skull  Base To Thigh  Result Date: 02/23/2019 CLINICAL DATA:  Initial treatment strategy for pulmonary mass. EXAM: NUCLEAR MEDICINE PET SKULL BASE TO THIGH TECHNIQUE: 10.9 mCi F-18 FDG was injected intravenously. Full-ring PET imaging was performed from the skull base to thigh after the radiotracer. CT data was obtained and used for attenuation correction and anatomic localization. Fasting blood glucose: 108 mg/dl COMPARISON:  None. FINDINGS: Mediastinal blood pool activity: SUV max 2.2 Liver activity: SUV max NA NECK: Extension of the LEFT lobe of thyroid gland to substernal location. There is peripheral calcification of this nodular extension. This nodular extension is hypermetabolic SUV max equal 8.0 (image 53) and measures 2.9 x 2.3 cm. Incidental CT findings: none CHEST: Hypermetabolic RIGHT upper lobe mass measures 3.5 x 2.5 cm with intense metabolic activity (SUV max equal 18.2) Bulky RIGHT hilar lymph node measures 3.3 cm with SUV max equal 14.8. Hypermetabolic RIGHT lower paratracheal node measures 14 mm with SUV max equal 19.2. No hypermetabolic supraclavicular nodes. No contralateral  hypermetabolic lymph nodes. No LEFT lung pulmonary nodules. Incidental CT findings: Coronary artery calcification and aortic atherosclerotic calcification. ABDOMEN/PELVIS: No abnormal hypermetabolic activity within the liver, pancreas, adrenal glands, or spleen. No hypermetabolic lymph nodes in the abdomen or pelvis. Incidental CT findings: Low-density lesion in the subcapsular RIGHT hepatic lobe without associated metabolic activity. Thin capsular calcification this level (image 100/4).Favor prior trauma or infection. Prostate enlarged to 61 mm in diameter. Atherosclerotic calcification of the aorta. Small gallstone. SKELETON: No focal hypermetabolic activity to suggest skeletal metastasis. Incidental CT findings: none IMPRESSION: 1. Intensely hypermetabolic RIGHT upper lobe mass. 2. RIGHT hilar hypermetabolic metastatic adenopathy. RIGHT paratracheal hypermetabolic nodal metastasis. Staging by FDG PET imaging T2a N2 m0 3. Hypermetabolic nodule extending from the inferior aspect of the LEFT lobe of thyroid gland with differential including thyroid carcinoma versus thyroid adenoma. Metastatic disease is not favored. Consider thyroid ultrasound and potential biopsy. This lesion may be difficult to ultrasound as extends into the thoracic inlet. Electronically Signed   By: Carl Harrell M.D.   On: 02/23/2019 14:58     ASSESSMENT/PLAN:  This is a very pleasant 73 year old Caucasian male diagnosed with stage IIIa (T2b,N2, M0) non-small cell lung cancer, pathology favoring adenocarcinoma.  He presented with a right upper lobe lung mass in addition to right hilar and mediastinal lymphadenopathy.  He was diagnosed in September 2020.  He is negative for any actionable mutations.   The patient is currently undergoing weekly concurrent chemoradiation with carboplatin for an AUC of 2 and paclitaxel 45 mg/m.  The patient is status post 1 cycle and he is tolerating it well without any adverse side effects.  The  patient was seen with Dr. Julien Harrell today.  Labs were reviewed.  We recommend that he proceed with cycle #2 today scheduled.  We will see the patient back for a follow-up visit 2 weeks for evaluation before starting cycle #4.  Dr. Julien Harrell stated in his last note that if the patient has a good response to this treatment and no evidence of progression after the course of concurrent chemoradiation, he may be considered for consolidation treatment with immunotherapy unless he is a surgical candidate at that time.  He will continue benzoate for the cough.  The patient's PET scan showed incidental hyper metabolic nodule extending from the inferior aspect of the left lobe of the thyroid gland.  We will arrange for the patient to have an ultrasound of the thyroid performed to further evaluate this.  The patient was advised  to call immediately if he has any concerning symptoms in the interval. The patient voices understanding of current disease status and treatment options and is in agreement with the current care plan. All questions were answered. The patient knows to call the clinic with any problems, questions or concerns. We can certainly see the patient much sooner if necessary.  Orders Placed This Encounter  Procedures  . US THYROID    Standing Status:   Future    Standing Expiration Date:   05/11/2020    Order Specific Question:   Reason for Exam (SYMPTOM  OR DIAGNOSIS REQUIRED)    Answer:   Hypermetabolic nodule in the thyroid from recent PET scan    Order Specific Question:   Preferred imaging location?    Answer:   [place], PA-C 03/12/19  ADDENDUM: Hematology/Oncology Attending: I had a face-to-face encounter with the patient today.  I recommended his care plan.  This is a very pleasant 73 years old white male recently diagnosed with a stage IIIa non-small cell lung cancer, adenocarcinoma with no actionable mutations. The patient is currently  undergoing a course of concurrent chemoradiation with weekly carboplatin and paclitaxel status post 1 cycle.  He tolerated the first cycle of his treatment well with no concerning adverse effects. I recommended for him to proceed with cycle #2 today as planned. We will see him back for follow-up visit in 2 weeks for evaluation before starting cycle #4. The patient was advised to call immediately if he has any concerning symptoms in the interval.  Disclaimer: This note was dictated with voice recognition software. Similar sounding words can inadvertently be transcribed and may be missed upon review. Carl Kempf, MD 03/12/19

## 2019-03-13 ENCOUNTER — Ambulatory Visit
Admission: RE | Admit: 2019-03-13 | Discharge: 2019-03-13 | Disposition: A | Discharge status: Home / Self Care | Payer: Medicare Other | Source: Ambulatory Visit | Attending: Radiation Oncology | Admitting: Radiation Oncology

## 2019-03-13 ENCOUNTER — Other Ambulatory Visit: Payer: Self-pay

## 2019-03-13 ENCOUNTER — Encounter: Payer: Self-pay | Admitting: *Deleted

## 2019-03-13 DIAGNOSIS — C3490 Malignant neoplasm of unspecified part of unspecified bronchus or lung: Secondary | ICD-10-CM | POA: Diagnosis not present

## 2019-03-13 DIAGNOSIS — Z510 Encounter for antineoplastic radiation therapy: Secondary | ICD-10-CM | POA: Diagnosis not present

## 2019-03-13 DIAGNOSIS — C3411 Malignant neoplasm of upper lobe, right bronchus or lung: Secondary | ICD-10-CM | POA: Diagnosis not present

## 2019-03-13 NOTE — Progress Notes (Signed)
Oncology Nurse Navigator Documentation  Oncology Nurse Navigator Flowsheets 03/13/2019  Diagnosis Status -  Phase of Treatment -  Navigator Follow Up Date: -  Navigator Follow Up Reason: -  Navigator Location [facility]  Referral Date to RadOnc/MedOnc -  Navigator Encounter Type Other/I followed up on Carl Harrell's schedule.  He is set up for chemo and [place] bx with follow ups.  He is also getting radiation therapy at this time. No barriers identified.   Abnormal Finding Date -  Confirmed Diagnosis Date -  Treatment Initiated Date -  Treatment Phase Treatment  Barriers/Navigation Needs -  Education -  Interventions Other  Coordination of Care Other  Education Method -  Support Groups/Services -  Acuity Level 2-Minimal Needs (1-2 Barriers Identified)  Acuity Level 2 -  Time Spent with Patient 15

## 2019-03-14 ENCOUNTER — Ambulatory Visit
Admission: RE | Admit: 2019-03-14 | Discharge: 2019-03-14 | Disposition: A | Discharge status: Home / Self Care | Payer: Medicare Other | Source: Ambulatory Visit | Attending: Radiation Oncology | Admitting: Radiation Oncology

## 2019-03-14 ENCOUNTER — Other Ambulatory Visit: Payer: Self-pay

## 2019-03-14 DIAGNOSIS — Z510 Encounter for antineoplastic radiation therapy: Secondary | ICD-10-CM | POA: Diagnosis not present

## 2019-03-14 DIAGNOSIS — C3490 Malignant neoplasm of unspecified part of unspecified bronchus or lung: Secondary | ICD-10-CM | POA: Diagnosis not present

## 2019-03-14 DIAGNOSIS — C3411 Malignant neoplasm of upper lobe, right bronchus or lung: Secondary | ICD-10-CM | POA: Diagnosis not present

## 2019-03-15 ENCOUNTER — Other Ambulatory Visit: Payer: Self-pay

## 2019-03-15 ENCOUNTER — Ambulatory Visit
Admission: RE | Admit: 2019-03-15 | Discharge: 2019-03-15 | Disposition: A | Discharge status: Home / Self Care | Payer: Medicare Other | Source: Ambulatory Visit | Attending: Radiation Oncology | Admitting: Radiation Oncology

## 2019-03-15 DIAGNOSIS — Z510 Encounter for antineoplastic radiation therapy: Secondary | ICD-10-CM | POA: Diagnosis not present

## 2019-03-15 DIAGNOSIS — C3490 Malignant neoplasm of unspecified part of unspecified bronchus or lung: Secondary | ICD-10-CM | POA: Diagnosis not present

## 2019-03-15 DIAGNOSIS — C3411 Malignant neoplasm of upper lobe, right bronchus or lung: Secondary | ICD-10-CM | POA: Diagnosis not present

## 2019-03-16 ENCOUNTER — Ambulatory Visit
Admission: RE | Admit: 2019-03-16 | Discharge: 2019-03-16 | Disposition: A | Discharge status: Home / Self Care | Payer: Medicare Other | Source: Ambulatory Visit | Attending: Radiation Oncology | Admitting: Radiation Oncology

## 2019-03-16 ENCOUNTER — Ambulatory Visit (HOSPITAL_COMMUNITY)
Admission: RE | Admit: 2019-03-16 | Discharge: 2019-03-16 | Disposition: A | Discharge status: Home / Self Care | Payer: Medicare Other | Source: Ambulatory Visit | Attending: Physician Assistant | Admitting: Physician Assistant

## 2019-03-16 ENCOUNTER — Other Ambulatory Visit: Payer: Self-pay

## 2019-03-16 DIAGNOSIS — E041 Nontoxic single thyroid nodule: Secondary | ICD-10-CM | POA: Diagnosis not present

## 2019-03-16 DIAGNOSIS — C3490 Malignant neoplasm of unspecified part of unspecified bronchus or lung: Secondary | ICD-10-CM | POA: Diagnosis not present

## 2019-03-16 DIAGNOSIS — Z510 Encounter for antineoplastic radiation therapy: Secondary | ICD-10-CM | POA: Diagnosis not present

## 2019-03-16 DIAGNOSIS — C3411 Malignant neoplasm of upper lobe, right bronchus or lung: Secondary | ICD-10-CM | POA: Diagnosis not present

## 2019-03-16 LAB — GUARDANT 360

## 2019-03-19 ENCOUNTER — Inpatient Hospital Stay: Payer: Medicare Other

## 2019-03-19 ENCOUNTER — Other Ambulatory Visit: Payer: Self-pay

## 2019-03-19 ENCOUNTER — Telehealth: Payer: Self-pay | Admitting: Physician Assistant

## 2019-03-19 ENCOUNTER — Other Ambulatory Visit: Payer: Self-pay | Admitting: Physician Assistant

## 2019-03-19 ENCOUNTER — Ambulatory Visit
Admission: RE | Admit: 2019-03-19 | Discharge: 2019-03-19 | Disposition: A | Discharge status: Home / Self Care | Payer: Medicare Other | Source: Ambulatory Visit | Attending: Radiation Oncology | Admitting: Radiation Oncology

## 2019-03-19 VITALS — BP 106/70 | HR 80 | Temp 98.6°F | Resp 17 | Ht 67.0 in | Wt 199.0 lb

## 2019-03-19 DIAGNOSIS — Z79899 Other long term (current) drug therapy: Secondary | ICD-10-CM | POA: Diagnosis not present

## 2019-03-19 DIAGNOSIS — E041 Nontoxic single thyroid nodule: Secondary | ICD-10-CM

## 2019-03-19 DIAGNOSIS — C3491 Malignant neoplasm of unspecified part of right bronchus or lung: Secondary | ICD-10-CM

## 2019-03-19 DIAGNOSIS — Z7982 Long term (current) use of aspirin: Secondary | ICD-10-CM | POA: Diagnosis not present

## 2019-03-19 DIAGNOSIS — R042 Hemoptysis: Secondary | ICD-10-CM | POA: Diagnosis not present

## 2019-03-19 DIAGNOSIS — I10 Essential (primary) hypertension: Secondary | ICD-10-CM | POA: Diagnosis not present

## 2019-03-19 DIAGNOSIS — Z5111 Encounter for antineoplastic chemotherapy: Secondary | ICD-10-CM | POA: Diagnosis not present

## 2019-03-19 DIAGNOSIS — C3411 Malignant neoplasm of upper lobe, right bronchus or lung: Secondary | ICD-10-CM | POA: Diagnosis not present

## 2019-03-19 DIAGNOSIS — Z510 Encounter for antineoplastic radiation therapy: Secondary | ICD-10-CM | POA: Diagnosis not present

## 2019-03-19 DIAGNOSIS — C3490 Malignant neoplasm of unspecified part of unspecified bronchus or lung: Secondary | ICD-10-CM | POA: Diagnosis not present

## 2019-03-19 LAB — CMP (CANCER CENTER ONLY)
ALT: 22 U/L (ref 0–44)
AST: 12 U/L — ABNORMAL LOW (ref 15–41)
Albumin: 3.4 g/dL — ABNORMAL LOW (ref 3.5–5.0)
Alkaline Phosphatase: 76 U/L (ref 38–126)
Anion gap: 12 (ref 5–15)
BUN: 12 mg/dL (ref 8–23)
CO2: 23 mmol/L (ref 22–32)
Calcium: 8.9 mg/dL (ref 8.9–10.3)
Chloride: 103 mmol/L (ref 98–111)
Creatinine: 0.98 mg/dL (ref 0.61–1.24)
GFR, Est AFR Am: >60 mL/min (ref >60)
GFR, Estimated: >60 mL/min (ref >60)
Glucose, Bld: 129 mg/dL — ABNORMAL HIGH (ref 70–99)
Potassium: 3.8 mmol/L (ref 3.5–5.1)
Sodium: 138 mmol/L (ref 135–145)
Total Bilirubin: 0.4 mg/dL (ref 0.3–1.2)
Total Protein: 6.8 g/dL (ref 6.5–8.1)

## 2019-03-19 LAB — CBC WITH DIFFERENTIAL (CANCER CENTER ONLY)
Abs Immature Granulocytes: 0.04 10*3/uL (ref 0.00–0.07)
Basophils Absolute: 0 10*3/uL (ref 0.0–0.1)
Basophils Relative: 1 %
Eosinophils Absolute: 0.1 10*3/uL (ref 0.0–0.5)
Eosinophils Relative: 2 %
HCT: 41.8 % (ref 39.0–52.0)
Hemoglobin: 13.4 g/dL (ref 13.0–17.0)
Immature Granulocytes: 1 %
Lymphocytes Relative: 17 %
Lymphs Abs: 0.9 10*3/uL (ref 0.7–4.0)
MCH: 26.9 pg (ref 26.0–34.0)
MCHC: 32.1 g/dL (ref 30.0–36.0)
MCV: 83.8 fL (ref 80.0–100.0)
Monocytes Absolute: 0.6 10*3/uL (ref 0.1–1.0)
Monocytes Relative: 11 %
Neutro Abs: 3.6 10*3/uL (ref 1.7–7.7)
Neutrophils Relative %: 68 %
Platelet Count: 290 10*3/uL (ref 150–400)
RBC: 4.99 MIL/uL (ref 4.22–5.81)
RDW: 14.2 % (ref 11.5–15.5)
WBC Count: 5.3 10*3/uL (ref 4.0–10.5)
nRBC: 0 % (ref 0.0–0.2)

## 2019-03-19 MED ORDER — SODIUM CHLORIDE 0.9 % IV SOLN
45.0000 mg/m2 | Freq: Once | INTRAVENOUS | Status: AC
  Administered 2019-03-19: 90 mg via INTRAVENOUS
  Filled 2019-03-19: qty 15

## 2019-03-19 MED ORDER — PALONOSETRON HCL INJECTION 0.25 MG/5ML
0.2500 mg | Freq: Once | INTRAVENOUS | Status: AC
  Administered 2019-03-19: 0.25 mg via INTRAVENOUS

## 2019-03-19 MED ORDER — PALONOSETRON HCL INJECTION 0.25 MG/5ML
INTRAVENOUS | Status: AC
  Filled 2019-03-19: qty 5

## 2019-03-19 MED ORDER — DIPHENHYDRAMINE HCL 50 MG/ML IJ SOLN
50.0000 mg | Freq: Once | INTRAMUSCULAR | Status: AC
  Administered 2019-03-19: 50 mg via INTRAVENOUS

## 2019-03-19 MED ORDER — SODIUM CHLORIDE 0.9 % IV SOLN
20.0000 mg | Freq: Once | INTRAVENOUS | Status: AC
  Administered 2019-03-19: 11:00:00 20 mg via INTRAVENOUS
  Filled 2019-03-19: qty 20

## 2019-03-19 MED ORDER — SODIUM CHLORIDE 0.9 % IV SOLN
204.6000 mg | Freq: Once | INTRAVENOUS | Status: AC
  Administered 2019-03-19: 200 mg via INTRAVENOUS
  Filled 2019-03-19: qty 20

## 2019-03-19 MED ORDER — FAMOTIDINE IN NACL 20-0.9 MG/50ML-% IV SOLN
20.0000 mg | Freq: Once | INTRAVENOUS | Status: AC
  Administered 2019-03-19: 20 mg via INTRAVENOUS

## 2019-03-19 MED ORDER — FAMOTIDINE IN NACL 20-0.9 MG/50ML-% IV SOLN
INTRAVENOUS | Status: AC
  Filled 2019-03-19: qty 50

## 2019-03-19 MED ORDER — DIPHENHYDRAMINE HCL 50 MG/ML IJ SOLN
INTRAMUSCULAR | Status: AC
  Filled 2019-03-19: qty 1

## 2019-03-19 MED ORDER — SODIUM CHLORIDE 0.9 % IV SOLN
Freq: Once | INTRAVENOUS | Status: AC
  Administered 2019-03-19: 11:00:00 via INTRAVENOUS
  Filled 2019-03-19: qty 250

## 2019-03-19 NOTE — Telephone Encounter (Signed)
Called but unable to reach patient. Left a voicemail instructing the patient to call us back. I just was calling him to inform him that I ordered a FNA of the thyroid nodule seen on his recent thyroid ultrasound/PET scan and to be on the look out for a phone call to schedule this procedure. Gave him our number to return out call to let him know of the info above.

## 2019-03-19 NOTE — Patient Instructions (Signed)
[facility] Discharge Instructions for Patients Receiving Chemotherapy  Today you received the following chemotherapy agents: Paclitaxel (Taxol) and Carboplatin (Paraplatin)  To help prevent nausea and vomiting after your treatment, we encourage you to take your nausea medication as directed.    If you develop nausea and vomiting that is not controlled by your nausea medication, call the clinic.   BELOW ARE SYMPTOMS THAT SHOULD BE REPORTED IMMEDIATELY:  *FEVER GREATER THAN 100.5 F  *CHILLS WITH OR WITHOUT FEVER  NAUSEA AND VOMITING THAT IS NOT CONTROLLED WITH YOUR NAUSEA MEDICATION  *UNUSUAL SHORTNESS OF BREATH  *UNUSUAL BRUISING OR BLEEDING  TENDERNESS IN MOUTH AND THROAT WITH OR WITHOUT PRESENCE OF ULCERS  *URINARY PROBLEMS  *BOWEL PROBLEMS  UNUSUAL RASH Items with * indicate a potential emergency and should be followed up as soon as possible.  Feel free to call the clinic should you have any questions or concerns. The clinic phone number is [phone].  Please show the [facility] at check-in to the Emergency Department and triage nurse.  Coronavirus (COVID-19) Are you at risk?  Are you at risk for the Coronavirus (COVID-19)?  To be considered HIGH RISK for Coronavirus (COVID-19), you have to meet the following criteria:  . Traveled to [place], [place], [place], [place] or [place]; or in the [place] to [place], [place], [place], or [place]; and have fever, cough, and shortness of breath within the last 2 weeks of travel OR . Been in close contact with a person diagnosed with COVID-19 within the last 2 weeks and have fever, cough, and shortness of breath . IF YOU DO NOT MEET THESE CRITERIA, YOU ARE CONSIDERED LOW RISK FOR COVID-19.  What to do if you are HIGH RISK for COVID-19?  Marland Kitchen If you are having a medical emergency, call 911. . Seek medical care right away. Before you go to a doctor's office, urgent care or emergency department,  call ahead and tell them about your recent travel, contact with someone diagnosed with COVID-19, and your symptoms. You should receive instructions from your physician's office regarding next steps of care.  . When you arrive at healthcare provider, tell the healthcare staff immediately you have returned from visiting [place], [place], [place], [place] or [place]; or traveled in the [place] to [place], [place], [place], or [place]; in the last two weeks or you have been in close contact with a person diagnosed with COVID-19 in the last 2 weeks.   . Tell the health care staff about your symptoms: fever, cough and shortness of breath. . After you have been seen by a medical provider, you will be either: o Tested for (COVID-19) and discharged home on quarantine except to seek medical care if symptoms worsen, and asked to  - Stay home and avoid contact with others until you get your results (4-5 days)  - Avoid travel on public transportation if possible (such as bus, train, or airplane) or o Sent to the Emergency Department by EMS for evaluation, COVID-19 testing, and possible admission depending on your condition and test results.  What to do if you are LOW RISK for COVID-19?  Reduce your risk of any infection by using the same precautions used for avoiding the common cold or flu:  Marland Kitchen Wash your hands often with soap and warm water for at least 20 seconds.  If soap and water are not readily available, use an alcohol-based hand sanitizer with at least 60% alcohol.  Marland Kitchen  If coughing or sneezing, cover your mouth and nose by coughing or sneezing into the elbow areas of your shirt or coat, into a tissue or into your sleeve (not your hands). . Avoid shaking hands with others and consider head nods or verbal greetings only. . Avoid touching your eyes, nose, or mouth with unwashed hands.  . Avoid close contact with people who are sick. . Avoid places or events with large numbers of people in one  location, like concerts or sporting events. . Carefully consider travel plans you have or are making. . If you are planning any travel outside or inside the [place], visit the CDC's Travelers' Health webpage for the latest health notices. . If you have some symptoms but not all symptoms, continue to monitor at home and seek medical attention if your symptoms worsen. . If you are having a medical emergency, call 911.   [place] / e-Visit: [email or web address]         [facility]: [facility]: [phone]                   [facility]: [phone]

## 2019-03-19 NOTE — Telephone Encounter (Signed)
Pt.notified

## 2019-03-20 ENCOUNTER — Ambulatory Visit
Admission: RE | Admit: 2019-03-20 | Discharge: 2019-03-20 | Disposition: A | Discharge status: Home / Self Care | Payer: Medicare Other | Source: Ambulatory Visit | Attending: Radiation Oncology | Admitting: Radiation Oncology

## 2019-03-20 ENCOUNTER — Other Ambulatory Visit: Payer: Self-pay

## 2019-03-20 ENCOUNTER — Telehealth: Payer: Self-pay | Admitting: Medical Oncology

## 2019-03-20 DIAGNOSIS — C3490 Malignant neoplasm of unspecified part of unspecified bronchus or lung: Secondary | ICD-10-CM | POA: Diagnosis not present

## 2019-03-20 DIAGNOSIS — Z510 Encounter for antineoplastic radiation therapy: Secondary | ICD-10-CM | POA: Diagnosis not present

## 2019-03-20 DIAGNOSIS — C3411 Malignant neoplasm of upper lobe, right bronchus or lung: Secondary | ICD-10-CM | POA: Diagnosis not present

## 2019-03-20 NOTE — Telephone Encounter (Signed)
FNA scheduled on 04/05/19 at 4 p at [facility].

## 2019-03-21 ENCOUNTER — Ambulatory Visit
Admission: RE | Admit: 2019-03-21 | Discharge: 2019-03-21 | Disposition: A | Discharge status: Home / Self Care | Payer: Medicare Other | Source: Ambulatory Visit | Attending: Radiation Oncology | Admitting: Radiation Oncology

## 2019-03-21 ENCOUNTER — Other Ambulatory Visit: Payer: Self-pay

## 2019-03-21 DIAGNOSIS — C3490 Malignant neoplasm of unspecified part of unspecified bronchus or lung: Secondary | ICD-10-CM | POA: Diagnosis not present

## 2019-03-21 DIAGNOSIS — C3411 Malignant neoplasm of upper lobe, right bronchus or lung: Secondary | ICD-10-CM | POA: Diagnosis not present

## 2019-03-21 DIAGNOSIS — Z510 Encounter for antineoplastic radiation therapy: Secondary | ICD-10-CM | POA: Diagnosis not present

## 2019-03-22 ENCOUNTER — Other Ambulatory Visit: Payer: Self-pay

## 2019-03-22 ENCOUNTER — Ambulatory Visit
Admission: RE | Admit: 2019-03-22 | Discharge: 2019-03-22 | Disposition: A | Discharge status: Home / Self Care | Payer: Medicare Other | Source: Ambulatory Visit | Attending: Radiation Oncology | Admitting: Radiation Oncology

## 2019-03-22 DIAGNOSIS — C3411 Malignant neoplasm of upper lobe, right bronchus or lung: Secondary | ICD-10-CM | POA: Diagnosis not present

## 2019-03-22 DIAGNOSIS — C3490 Malignant neoplasm of unspecified part of unspecified bronchus or lung: Secondary | ICD-10-CM | POA: Diagnosis not present

## 2019-03-22 DIAGNOSIS — Z510 Encounter for antineoplastic radiation therapy: Secondary | ICD-10-CM | POA: Diagnosis not present

## 2019-03-23 ENCOUNTER — Other Ambulatory Visit: Payer: Self-pay

## 2019-03-23 ENCOUNTER — Ambulatory Visit
Admission: RE | Admit: 2019-03-23 | Discharge: 2019-03-23 | Disposition: A | Discharge status: Home / Self Care | Payer: Medicare Other | Source: Ambulatory Visit | Attending: Radiation Oncology | Admitting: Radiation Oncology

## 2019-03-23 DIAGNOSIS — C3411 Malignant neoplasm of upper lobe, right bronchus or lung: Secondary | ICD-10-CM | POA: Diagnosis not present

## 2019-03-23 DIAGNOSIS — C3490 Malignant neoplasm of unspecified part of unspecified bronchus or lung: Secondary | ICD-10-CM | POA: Diagnosis not present

## 2019-03-23 DIAGNOSIS — Z510 Encounter for antineoplastic radiation therapy: Secondary | ICD-10-CM | POA: Diagnosis not present

## 2019-03-26 ENCOUNTER — Other Ambulatory Visit: Payer: Self-pay

## 2019-03-26 ENCOUNTER — Encounter: Payer: Self-pay | Admitting: Internal Medicine

## 2019-03-26 ENCOUNTER — Inpatient Hospital Stay: Payer: Medicare Other | Attending: Internal Medicine

## 2019-03-26 ENCOUNTER — Inpatient Hospital Stay: Payer: Medicare Other

## 2019-03-26 ENCOUNTER — Inpatient Hospital Stay (HOSPITAL_BASED_OUTPATIENT_CLINIC_OR_DEPARTMENT_OTHER): Payer: Medicare Other | Admitting: Internal Medicine

## 2019-03-26 ENCOUNTER — Ambulatory Visit
Admission: RE | Admit: 2019-03-26 | Discharge: 2019-03-26 | Disposition: A | Discharge status: Home / Self Care | Payer: Medicare Other | Source: Ambulatory Visit | Attending: Radiation Oncology | Admitting: Radiation Oncology

## 2019-03-26 VITALS — BP 122/88 | HR 82 | Temp 97.1°F | Resp 17 | Ht 67.0 in | Wt 199.1 lb

## 2019-03-26 DIAGNOSIS — E041 Nontoxic single thyroid nodule: Secondary | ICD-10-CM | POA: Insufficient documentation

## 2019-03-26 DIAGNOSIS — C3491 Malignant neoplasm of unspecified part of right bronchus or lung: Secondary | ICD-10-CM | POA: Diagnosis not present

## 2019-03-26 DIAGNOSIS — E785 Hyperlipidemia, unspecified: Secondary | ICD-10-CM | POA: Diagnosis not present

## 2019-03-26 DIAGNOSIS — Z510 Encounter for antineoplastic radiation therapy: Secondary | ICD-10-CM | POA: Insufficient documentation

## 2019-03-26 DIAGNOSIS — I10 Essential (primary) hypertension: Secondary | ICD-10-CM | POA: Insufficient documentation

## 2019-03-26 DIAGNOSIS — Z79899 Other long term (current) drug therapy: Secondary | ICD-10-CM | POA: Insufficient documentation

## 2019-03-26 DIAGNOSIS — Z5111 Encounter for antineoplastic chemotherapy: Secondary | ICD-10-CM | POA: Insufficient documentation

## 2019-03-26 DIAGNOSIS — C3411 Malignant neoplasm of upper lobe, right bronchus or lung: Secondary | ICD-10-CM | POA: Insufficient documentation

## 2019-03-26 DIAGNOSIS — C3490 Malignant neoplasm of unspecified part of unspecified bronchus or lung: Secondary | ICD-10-CM | POA: Insufficient documentation

## 2019-03-26 LAB — CBC WITH DIFFERENTIAL (CANCER CENTER ONLY)
Abs Immature Granulocytes: 0.03 10*3/uL (ref 0.00–0.07)
Basophils Absolute: 0 10*3/uL (ref 0.0–0.1)
Basophils Relative: 1 %
Eosinophils Absolute: 0.1 10*3/uL (ref 0.0–0.5)
Eosinophils Relative: 2 %
HCT: 41.4 % (ref 39.0–52.0)
Hemoglobin: 13.4 g/dL (ref 13.0–17.0)
Immature Granulocytes: 1 %
Lymphocytes Relative: 15 %
Lymphs Abs: 0.8 10*3/uL (ref 0.7–4.0)
MCH: 27.1 pg (ref 26.0–34.0)
MCHC: 32.4 g/dL (ref 30.0–36.0)
MCV: 83.8 fL (ref 80.0–100.0)
Monocytes Absolute: 0.5 10*3/uL (ref 0.1–1.0)
Monocytes Relative: 9 %
Neutro Abs: 3.9 10*3/uL (ref 1.7–7.7)
Neutrophils Relative %: 72 %
Platelet Count: 225 10*3/uL (ref 150–400)
RBC: 4.94 MIL/uL (ref 4.22–5.81)
RDW: 14.6 % (ref 11.5–15.5)
WBC Count: 5.3 10*3/uL (ref 4.0–10.5)
nRBC: 0 % (ref 0.0–0.2)

## 2019-03-26 LAB — CMP (CANCER CENTER ONLY)
ALT: 27 U/L (ref 0–44)
AST: 14 U/L — ABNORMAL LOW (ref 15–41)
Albumin: 3.6 g/dL (ref 3.5–5.0)
Alkaline Phosphatase: 76 U/L (ref 38–126)
Anion gap: 10 (ref 5–15)
BUN: 10 mg/dL (ref 8–23)
CO2: 24 mmol/L (ref 22–32)
Calcium: 9 mg/dL (ref 8.9–10.3)
Chloride: 106 mmol/L (ref 98–111)
Creatinine: 0.92 mg/dL (ref 0.61–1.24)
GFR, Est AFR Am: >60 mL/min (ref >60)
GFR, Estimated: >60 mL/min (ref >60)
Glucose, Bld: 110 mg/dL — ABNORMAL HIGH (ref 70–99)
Potassium: 3.9 mmol/L (ref 3.5–5.1)
Sodium: 140 mmol/L (ref 135–145)
Total Bilirubin: 0.5 mg/dL (ref 0.3–1.2)
Total Protein: 7.1 g/dL (ref 6.5–8.1)

## 2019-03-26 MED ORDER — PALONOSETRON HCL INJECTION 0.25 MG/5ML
0.2500 mg | Freq: Once | INTRAVENOUS | Status: AC
  Administered 2019-03-26: 0.25 mg via INTRAVENOUS

## 2019-03-26 MED ORDER — DIPHENHYDRAMINE HCL 50 MG/ML IJ SOLN
INTRAMUSCULAR | Status: AC
  Filled 2019-03-26: qty 1

## 2019-03-26 MED ORDER — SODIUM CHLORIDE 0.9 % IV SOLN
Freq: Once | INTRAVENOUS | Status: AC
  Administered 2019-03-26: 12:00:00 via INTRAVENOUS
  Filled 2019-03-26: qty 250

## 2019-03-26 MED ORDER — SODIUM CHLORIDE 0.9 % IV SOLN
20.0000 mg | Freq: Once | INTRAVENOUS | Status: AC
  Administered 2019-03-26: 20 mg via INTRAVENOUS
  Filled 2019-03-26: qty 20

## 2019-03-26 MED ORDER — DIPHENHYDRAMINE HCL 50 MG/ML IJ SOLN
50.0000 mg | Freq: Once | INTRAMUSCULAR | Status: AC
  Administered 2019-03-26: 50 mg via INTRAVENOUS

## 2019-03-26 MED ORDER — PALONOSETRON HCL INJECTION 0.25 MG/5ML
INTRAVENOUS | Status: AC
  Filled 2019-03-26: qty 5

## 2019-03-26 MED ORDER — SODIUM CHLORIDE 0.9 % IV SOLN
45.0000 mg/m2 | Freq: Once | INTRAVENOUS | Status: AC
  Administered 2019-03-26: 90 mg via INTRAVENOUS
  Filled 2019-03-26: qty 15

## 2019-03-26 MED ORDER — FAMOTIDINE IN NACL 20-0.9 MG/50ML-% IV SOLN
20.0000 mg | Freq: Once | INTRAVENOUS | Status: AC
  Administered 2019-03-26: 20 mg via INTRAVENOUS

## 2019-03-26 MED ORDER — SODIUM CHLORIDE 0.9 % IV SOLN
204.6000 mg | Freq: Once | INTRAVENOUS | Status: AC
  Administered 2019-03-26: 15:00:00 200 mg via INTRAVENOUS
  Filled 2019-03-26: qty 20

## 2019-03-26 MED ORDER — FAMOTIDINE IN NACL 20-0.9 MG/50ML-% IV SOLN
INTRAVENOUS | Status: AC
  Filled 2019-03-26: qty 50

## 2019-03-26 NOTE — Progress Notes (Signed)
[place] [phone]   [phone]  OFFICE PROGRESS NOTE  Carl Ruddy, MD [place] [address]  DIAGNOSIS: Stage IIIA (T2b, N2, M0) non-small cell lung cancer likely adenocarcinoma pending final tissue diagnosis presented with right upper lobe lung mass in addition to right hilar and mediastinal lymphadenopathy diagnosed in September 2020  PRIOR THERAPY: None  CURRENT THERAPY: Concurrent chemoradiation with weekly carboplatin for AUC of 2 and paclitaxel 45 mg/M2.  First dose March 05, 2019.  Status post 3 cycles.  INTERVAL HISTORY: Carl Harrell 73 y.o. male returns to the clinic today for follow-up visit.  The patient is feeling fine today with no concerning complaints except for fatigue.  He also has one episode of nausea after his last treatment.  He denied having any current fever or chills.  He has no nausea, vomiting, diarrhea or constipation.  He has no chest pain but has mild shortness of breath with mild cough and no frank hemoptysis.  He denied having any recent weight loss or night sweats.  He denied having any dysphagia or odynophagia.  He is here today for evaluation before starting cycle #4 of his treatment.  MEDICAL HISTORY: Past Medical History:  Diagnosis Date   ALLERGIC RHINITIS 01/30/2007   Arthritis    "hands" (05/29/2014)   Cancer ([place])    lung, prostate   Constipation    Coronary artery disease    LHC (05/29/14): pLAD 50-70, pRCA 99 (L>R collats), EF 55% >> PCI: 3.5 x 28 mm Xience [place]pine DES to pRCA   Cough secondary to angiotensin converting enzyme inhibitor (ACE-I) 05/28/2013   ED (erectile dysfunction)    GERD 09/26/2007   HYPERLIPIDEMIA 09/26/2007   Hypertension    PLANTAR FASCIITIS, BILATERAL 07/08/2009   Unspecified hearing loss 06/16/2009   wears aides both ears    ALLERGIES:  is allergic to lisinopril.  MEDICATIONS:  Current Outpatient Medications  Medication Sig  Dispense Refill   cetirizine (ZYRTEC) 10 MG tablet Take 1 tablet (10 mg total) by mouth daily. 30 tablet 11   Cyanocobalamin (VITAMIN B-12 CR PO) Take by mouth daily.     ezetimibe (ZETIA) 10 MG tablet TAKE 1 TABLET BY MOUTH EVERY DAY (Patient taking differently: Take 10 mg by mouth daily. ) 90 tablet 3   fluticasone (FLONASE) 50 MCG/ACT nasal spray SHAKE LIQUID AND USE 1 SPRAY IN EACH NOSTRIL DAILY (Patient taking differently: Place 1 spray into both nostrils daily. ) 48 g 0   hydrochlorothiazide (MICROZIDE) 12.5 MG capsule Take 1 capsule (12.5 mg total) by mouth daily. 90 capsule 3   losartan (COZAAR) 50 MG tablet Take 1 tablet (50 mg total) by mouth daily. 90 tablet 3   metoprolol tartrate (LOPRESSOR) 25 MG tablet TAKE 1 TABLET BY MOUTH  TWICE DAILY (Patient taking differently: Take 25 mg by mouth 2 (two) times daily. ) 180 tablet 1   Multiple Vitamin (MULTIVITAMIN WITH MINERALS) TABS tablet Take 1 tablet by mouth daily.     NITROSTAT 0.4 MG SL tablet PLACE 1 TABLET BY MOUTH UNDER TONGUE EVERY 5 MINUTES AS NEEDED FOR CHEST PAIN 25 tablet 12   omeprazole (PRILOSEC) 40 MG capsule TAKE 1 CAPSULE(40 MG) BY MOUTH DAILY (Patient taking differently: Take 40 mg by mouth daily. ) 90 capsule 1   prochlorperazine (COMPAZINE) 10 MG tablet Take 1 tablet (10 mg total) by mouth every 6 (six) hours as needed for nausea or vomiting. 30 tablet 0  simvastatin (ZOCOR) 40 MG tablet TAKE 1 TABLET(40 MG) BY MOUTH DAILY AT 6 PM (Patient taking differently: Take 40 mg by mouth daily at 6 PM. ) 90 tablet 3   benzonatate (TESSALON) 100 MG capsule Take 1 capsule (100 mg total) by mouth 2 (two) times daily as needed for cough. (Patient not taking: Reported on 03/26/2019) 20 capsule 0   tamsulosin (FLOMAX) 0.4 MG CAPS capsule Take 0.4 mg by mouth daily.     No current facility-administered medications for this visit.     SURGICAL HISTORY:  Past Surgical History:  Procedure Laterality Date   CARDIAC  CATHETERIZATION  05/29/2014   Procedure: CORONARY STENT INTERVENTION;  Surgeon: Sinclair Grooms, MD;  Location: [facility] CATH LAB;  Service: Cardiovascular;;  Prox RCA   COLONOSCOPY     CORONARY ANGIOPLASTY WITH STENT PLACEMENT  05/29/2014   "1"   KNEE ARTHROSCOPY Right ~ 2005   LEFT HEART CATHETERIZATION WITH CORONARY ANGIOGRAM N/A 05/29/2014   Procedure: LEFT HEART CATHETERIZATION WITH CORONARY ANGIOGRAM;  Surgeon: Sinclair Grooms, MD;  Location: [facility] CATH LAB;  Service: Cardiovascular;  Laterality: N/A;   VIDEO BRONCHOSCOPY WITH ENDOBRONCHIAL ULTRASOUND N/A 02/20/2019   Procedure: VIDEO BRONCHOSCOPY WITH ENDOBRONCHIAL ULTRASOUND;  Surgeon: Garner Nash, DO;  Location: [facility] OR;  Service: Thoracic;  Laterality: N/A;    REVIEW OF SYSTEMS:  A comprehensive review of systems was negative except for: Constitutional: positive for fatigue Respiratory: positive for cough and dyspnea on exertion   PHYSICAL EXAMINATION: General appearance: alert, cooperative, fatigued and no distress Head: Normocephalic, without obvious abnormality, atraumatic Neck: no adenopathy, no JVD, supple, symmetrical, trachea midline and thyroid not enlarged, symmetric, no tenderness/mass/nodules Lymph nodes: Cervical, supraclavicular, and axillary nodes normal. Resp: clear to auscultation bilaterally Back: symmetric, no curvature. ROM normal. No CVA tenderness. Cardio: regular rate and rhythm, S1, S2 normal, no murmur, click, rub or gallop GI: soft, non-tender; bowel sounds normal; no masses,  no organomegaly Extremities: extremities normal, atraumatic, no cyanosis or edema  ECOG PERFORMANCE STATUS: 1 - Symptomatic but completely ambulatory  Blood pressure 122/88, pulse 82, temperature (!) 97.1 F (36.2 C), temperature source Temporal, resp. rate 17, height 5\' 7"  (1.702 m), weight 199 lb 1.6 oz (90.3 kg), SpO2 95 %.  LABORATORY DATA: Lab Results  Component Value Date   WBC 5.3 03/26/2019   HGB 13.4 03/26/2019   HCT  41.4 03/26/2019   MCV 83.8 03/26/2019   PLT 225 03/26/2019      Chemistry      Component Value Date/Time   NA 140 03/26/2019 1101   NA 141 08/05/2016 0826   K 3.9 03/26/2019 1101   CL 106 03/26/2019 1101   CO2 24 03/26/2019 1101   BUN 10 03/26/2019 1101   BUN 14 08/05/2016 0826   CREATININE 0.92 03/26/2019 1101      Component Value Date/Time   CALCIUM 9.0 03/26/2019 1101   ALKPHOS 76 03/26/2019 1101   AST 14 (L) 03/26/2019 1101   ALT 27 03/26/2019 1101   BILITOT 0.5 03/26/2019 1101       RADIOGRAPHIC STUDIES: US Thyroid  Result Date: 03/17/2019 CLINICAL DATA:  Left inferior thyroid nodule by PET-CT with metabolic activity EXAM: THYROID ULTRASOUND TECHNIQUE: Ultrasound examination of the thyroid gland and adjacent soft tissues was performed. COMPARISON:  02/23/2019 PET-CT FINDINGS: Parenchymal Echotexture: Mildly heterogenous Isthmus: 3 mm Right lobe: 3.7 x 1.3 x 0.8 cm Left lobe: 4.2 x 2.7 x 2.7 cm _________________________________________________________ Estimated total number of nodules >/=  1 cm: 1 Number of spongiform nodules >/=  2 cm not described below (TR1): 0 Number of mixed cystic and solid nodules >/= 1.5 cm not described below (Between): 0 _________________________________________________________ Nodule # 1: Location: Left; Inferior Maximum size: 3.0 cm; Other 2 dimensions: 2.3 x 2.4 cm Composition: solid/almost completely solid (2) Echogenicity: isoechoic (1) Shape: not taller-than-wide (0) Margins: extra-thyroidal extension (3) Echogenic foci: none (0) ACR TI-RADS total points: 6. ACR TI-RADS risk category: TR4 (4-6 points). ACR TI-RADS recommendations: **Given size (>/= 1.5 cm) and appearance, fine needle aspiration of this moderately suspicious nodule should be considered based on TI-RADS criteria. _________________________________________________________ Minor thyroid heterogeneity. Subcentimeter benign cystic nodules in the right lobe noted. No hypervascularity. No  regional adenopathy. IMPRESSION: 3 cm left inferior TR 4 nodule meets criteria for biopsy as above. This correlates with the abnormal PET CT finding. The above is in keeping with the ACR TI-RADS recommendations - J Am Coll Radiol 2017;14:587-595. Electronically Signed   By: Jerilynn Mages.  Shick M.D.   On: 03/17/2019 12:05    ASSESSMENT AND PLAN: This is a very pleasant 73 years old white male with recently diagnosed a stage IIIa (T2b, N2, M0) non-small cell lung cancer favoring adenocarcinoma presented with right upper lobe lung mass in addition to right hilar and mediastinal lymphadenopathy diagnosed in September 2020. The patient is currently undergoing concurrent chemoradiation with weekly carboplatin and paclitaxel status post 3 cycles.  He has been tolerating this treatment well with no concerning adverse effects. I recommended for the patient to proceed with cycle #4 today as planned. The patient will come back for follow-up visit in 2 weeks for evaluation before the next cycle of his treatment. The patient voices understanding of current disease status and treatment options and is in agreement with the current care plan. All questions were answered. The patient knows to call the clinic with any problems, questions or concerns. We can certainly see the patient much sooner if necessary.  I spent 10 minutes counseling the patient face to face. The total time spent in the appointment was 15 minutes.  Disclaimer: This note was dictated with voice recognition software. Similar sounding words can inadvertently be transcribed and may not be corrected upon review.

## 2019-03-26 NOTE — Patient Instructions (Signed)
[facility] Discharge Instructions for Patients Receiving Chemotherapy  Today you received the following chemotherapy agents: Paclitaxel (Taxol) and Carboplatin (Paraplatin)  To help prevent nausea and vomiting after your treatment, we encourage you to take your nausea medication as directed.    If you develop nausea and vomiting that is not controlled by your nausea medication, call the clinic.   BELOW ARE SYMPTOMS THAT SHOULD BE REPORTED IMMEDIATELY:  *FEVER GREATER THAN 100.5 F  *CHILLS WITH OR WITHOUT FEVER  NAUSEA AND VOMITING THAT IS NOT CONTROLLED WITH YOUR NAUSEA MEDICATION  *UNUSUAL SHORTNESS OF BREATH  *UNUSUAL BRUISING OR BLEEDING  TENDERNESS IN MOUTH AND THROAT WITH OR WITHOUT PRESENCE OF ULCERS  *URINARY PROBLEMS  *BOWEL PROBLEMS  UNUSUAL RASH Items with * indicate a potential emergency and should be followed up as soon as possible.  Feel free to call the clinic should you have any questions or concerns. The clinic phone number is [phone].  Please show the [facility] at check-in to the Emergency Department and triage nurse.  Coronavirus (COVID-19) Are you at risk?  Are you at risk for the Coronavirus (COVID-19)?  To be considered HIGH RISK for Coronavirus (COVID-19), you have to meet the following criteria:  . Traveled to [place], [place], [place], [place] or [place]; or in the [place] to [place], [place], [place], or [place]; and have fever, cough, and shortness of breath within the last 2 weeks of travel OR . Been in close contact with a person diagnosed with COVID-19 within the last 2 weeks and have fever, cough, and shortness of breath . IF YOU DO NOT MEET THESE CRITERIA, YOU ARE CONSIDERED LOW RISK FOR COVID-19.  What to do if you are HIGH RISK for COVID-19?  Marland Kitchen If you are having a medical emergency, call 911. . Seek medical care right away. Before you go to a doctor's office, urgent care or emergency department,  call ahead and tell them about your recent travel, contact with someone diagnosed with COVID-19, and your symptoms. You should receive instructions from your physician's office regarding next steps of care.  . When you arrive at healthcare provider, tell the healthcare staff immediately you have returned from visiting [place], [place], [place], [place] or [place]; or traveled in the [place] to [place], [place], [place], or [place]; in the last two weeks or you have been in close contact with a person diagnosed with COVID-19 in the last 2 weeks.   . Tell the health care staff about your symptoms: fever, cough and shortness of breath. . After you have been seen by a medical provider, you will be either: o Tested for (COVID-19) and discharged home on quarantine except to seek medical care if symptoms worsen, and asked to  - Stay home and avoid contact with others until you get your results (4-5 days)  - Avoid travel on public transportation if possible (such as bus, train, or airplane) or o Sent to the Emergency Department by EMS for evaluation, COVID-19 testing, and possible admission depending on your condition and test results.  What to do if you are LOW RISK for COVID-19?  Reduce your risk of any infection by using the same precautions used for avoiding the common cold or flu:  Marland Kitchen Wash your hands often with soap and warm water for at least 20 seconds.  If soap and water are not readily available, use an alcohol-based hand sanitizer with at least 60% alcohol.  Marland Kitchen  If coughing or sneezing, cover your mouth and nose by coughing or sneezing into the elbow areas of your shirt or coat, into a tissue or into your sleeve (not your hands). . Avoid shaking hands with others and consider head nods or verbal greetings only. . Avoid touching your eyes, nose, or mouth with unwashed hands.  . Avoid close contact with people who are sick. . Avoid places or events with large numbers of people in one  location, like concerts or sporting events. . Carefully consider travel plans you have or are making. . If you are planning any travel outside or inside the [place], visit the CDC's Travelers' Health webpage for the latest health notices. . If you have some symptoms but not all symptoms, continue to monitor at home and seek medical attention if your symptoms worsen. . If you are having a medical emergency, call 911.   [place] / e-Visit: [email or web address]         [facility]: [facility]: [phone]                   [facility]: [phone]

## 2019-03-27 ENCOUNTER — Other Ambulatory Visit: Payer: Self-pay

## 2019-03-27 ENCOUNTER — Ambulatory Visit
Admission: RE | Admit: 2019-03-27 | Discharge: 2019-03-27 | Disposition: A | Discharge status: Home / Self Care | Payer: Medicare Other | Source: Ambulatory Visit | Attending: Radiation Oncology | Admitting: Radiation Oncology

## 2019-03-27 DIAGNOSIS — C3490 Malignant neoplasm of unspecified part of unspecified bronchus or lung: Secondary | ICD-10-CM | POA: Diagnosis not present

## 2019-03-27 DIAGNOSIS — C3411 Malignant neoplasm of upper lobe, right bronchus or lung: Secondary | ICD-10-CM | POA: Diagnosis not present

## 2019-03-27 DIAGNOSIS — Z510 Encounter for antineoplastic radiation therapy: Secondary | ICD-10-CM | POA: Diagnosis not present

## 2019-03-28 ENCOUNTER — Ambulatory Visit
Admission: RE | Admit: 2019-03-28 | Discharge: 2019-03-28 | Disposition: A | Discharge status: Home / Self Care | Payer: Medicare Other | Source: Ambulatory Visit | Attending: Radiation Oncology | Admitting: Radiation Oncology

## 2019-03-28 ENCOUNTER — Other Ambulatory Visit: Payer: Self-pay

## 2019-03-28 DIAGNOSIS — C3411 Malignant neoplasm of upper lobe, right bronchus or lung: Secondary | ICD-10-CM | POA: Diagnosis not present

## 2019-03-28 DIAGNOSIS — C3490 Malignant neoplasm of unspecified part of unspecified bronchus or lung: Secondary | ICD-10-CM | POA: Diagnosis not present

## 2019-03-28 DIAGNOSIS — Z510 Encounter for antineoplastic radiation therapy: Secondary | ICD-10-CM | POA: Diagnosis not present

## 2019-03-29 ENCOUNTER — Other Ambulatory Visit: Payer: Self-pay

## 2019-03-29 ENCOUNTER — Ambulatory Visit
Admission: RE | Admit: 2019-03-29 | Discharge: 2019-03-29 | Disposition: A | Discharge status: Home / Self Care | Payer: Medicare Other | Source: Ambulatory Visit | Attending: Radiation Oncology | Admitting: Radiation Oncology

## 2019-03-29 DIAGNOSIS — C3411 Malignant neoplasm of upper lobe, right bronchus or lung: Secondary | ICD-10-CM | POA: Diagnosis not present

## 2019-03-29 DIAGNOSIS — Z510 Encounter for antineoplastic radiation therapy: Secondary | ICD-10-CM | POA: Diagnosis not present

## 2019-03-29 DIAGNOSIS — C3490 Malignant neoplasm of unspecified part of unspecified bronchus or lung: Secondary | ICD-10-CM | POA: Diagnosis not present

## 2019-03-30 ENCOUNTER — Other Ambulatory Visit: Payer: Self-pay

## 2019-03-30 ENCOUNTER — Ambulatory Visit
Admission: RE | Admit: 2019-03-30 | Discharge: 2019-03-30 | Disposition: A | Discharge status: Home / Self Care | Payer: Medicare Other | Source: Ambulatory Visit | Attending: Radiation Oncology | Admitting: Radiation Oncology

## 2019-03-30 DIAGNOSIS — C3490 Malignant neoplasm of unspecified part of unspecified bronchus or lung: Secondary | ICD-10-CM | POA: Diagnosis not present

## 2019-03-30 DIAGNOSIS — C3411 Malignant neoplasm of upper lobe, right bronchus or lung: Secondary | ICD-10-CM | POA: Diagnosis not present

## 2019-03-30 DIAGNOSIS — Z510 Encounter for antineoplastic radiation therapy: Secondary | ICD-10-CM | POA: Diagnosis not present

## 2019-04-02 ENCOUNTER — Other Ambulatory Visit: Payer: Self-pay

## 2019-04-02 ENCOUNTER — Inpatient Hospital Stay: Payer: Medicare Other

## 2019-04-02 ENCOUNTER — Other Ambulatory Visit: Payer: Self-pay | Admitting: Internal Medicine

## 2019-04-02 ENCOUNTER — Ambulatory Visit
Admission: RE | Admit: 2019-04-02 | Discharge: 2019-04-02 | Disposition: A | Discharge status: Home / Self Care | Payer: Medicare Other | Source: Ambulatory Visit | Attending: Radiation Oncology | Admitting: Radiation Oncology

## 2019-04-02 VITALS — BP 116/85 | HR 98 | Temp 98.8°F | Resp 18

## 2019-04-02 DIAGNOSIS — C3491 Malignant neoplasm of unspecified part of right bronchus or lung: Secondary | ICD-10-CM

## 2019-04-02 DIAGNOSIS — I10 Essential (primary) hypertension: Secondary | ICD-10-CM | POA: Diagnosis not present

## 2019-04-02 DIAGNOSIS — Z79899 Other long term (current) drug therapy: Secondary | ICD-10-CM | POA: Diagnosis not present

## 2019-04-02 DIAGNOSIS — E785 Hyperlipidemia, unspecified: Secondary | ICD-10-CM | POA: Diagnosis not present

## 2019-04-02 DIAGNOSIS — Z5111 Encounter for antineoplastic chemotherapy: Secondary | ICD-10-CM | POA: Diagnosis not present

## 2019-04-02 DIAGNOSIS — C3411 Malignant neoplasm of upper lobe, right bronchus or lung: Secondary | ICD-10-CM | POA: Diagnosis not present

## 2019-04-02 DIAGNOSIS — Z510 Encounter for antineoplastic radiation therapy: Secondary | ICD-10-CM | POA: Diagnosis not present

## 2019-04-02 DIAGNOSIS — E041 Nontoxic single thyroid nodule: Secondary | ICD-10-CM | POA: Diagnosis not present

## 2019-04-02 DIAGNOSIS — C3490 Malignant neoplasm of unspecified part of unspecified bronchus or lung: Secondary | ICD-10-CM | POA: Diagnosis not present

## 2019-04-02 LAB — CMP (CANCER CENTER ONLY)
ALT: 26 U/L (ref 0–44)
AST: 16 U/L (ref 15–41)
Albumin: 3.7 g/dL (ref 3.5–5.0)
Alkaline Phosphatase: 72 U/L (ref 38–126)
Anion gap: 11 (ref 5–15)
BUN: 9 mg/dL (ref 8–23)
CO2: 22 mmol/L (ref 22–32)
Calcium: 8.9 mg/dL (ref 8.9–10.3)
Chloride: 106 mmol/L (ref 98–111)
Creatinine: 1.01 mg/dL (ref 0.61–1.24)
GFR, Est AFR Am: >60 mL/min (ref >60)
GFR, Estimated: >60 mL/min (ref >60)
Glucose, Bld: 118 mg/dL — ABNORMAL HIGH (ref 70–99)
Potassium: 3.8 mmol/L (ref 3.5–5.1)
Sodium: 139 mmol/L (ref 135–145)
Total Bilirubin: 0.7 mg/dL (ref 0.3–1.2)
Total Protein: 6.9 g/dL (ref 6.5–8.1)

## 2019-04-02 LAB — CBC WITH DIFFERENTIAL (CANCER CENTER ONLY)
Abs Immature Granulocytes: 0.02 10*3/uL (ref 0.00–0.07)
Basophils Absolute: 0 10*3/uL (ref 0.0–0.1)
Basophils Relative: 1 %
Eosinophils Absolute: 0.1 10*3/uL (ref 0.0–0.5)
Eosinophils Relative: 2 %
HCT: 39.9 % (ref 39.0–52.0)
Hemoglobin: 12.8 g/dL — ABNORMAL LOW (ref 13.0–17.0)
Immature Granulocytes: 1 %
Lymphocytes Relative: 16 %
Lymphs Abs: 0.6 10*3/uL — ABNORMAL LOW (ref 0.7–4.0)
MCH: 26.6 pg (ref 26.0–34.0)
MCHC: 32.1 g/dL (ref 30.0–36.0)
MCV: 83 fL (ref 80.0–100.0)
Monocytes Absolute: 0.4 10*3/uL (ref 0.1–1.0)
Monocytes Relative: 9 %
Neutro Abs: 3 10*3/uL (ref 1.7–7.7)
Neutrophils Relative %: 71 %
Platelet Count: 119 10*3/uL — ABNORMAL LOW (ref 150–400)
RBC: 4.81 MIL/uL (ref 4.22–5.81)
RDW: 15.1 % (ref 11.5–15.5)
WBC Count: 4.1 10*3/uL (ref 4.0–10.5)
nRBC: 0 % (ref 0.0–0.2)

## 2019-04-02 MED ORDER — SODIUM CHLORIDE 0.9 % IV SOLN
200.0000 mg | Freq: Once | INTRAVENOUS | Status: AC
  Administered 2019-04-02: 200 mg via INTRAVENOUS
  Filled 2019-04-02: qty 20

## 2019-04-02 MED ORDER — FAMOTIDINE IN NACL 20-0.9 MG/50ML-% IV SOLN
INTRAVENOUS | Status: AC
  Filled 2019-04-02: qty 50

## 2019-04-02 MED ORDER — SODIUM CHLORIDE 0.9 % IV SOLN
45.0000 mg/m2 | Freq: Once | INTRAVENOUS | Status: AC
  Administered 2019-04-02: 90 mg via INTRAVENOUS
  Filled 2019-04-02: qty 15

## 2019-04-02 MED ORDER — SODIUM CHLORIDE 0.9 % IV SOLN
Freq: Once | INTRAVENOUS | Status: AC
  Administered 2019-04-02: 14:00:00 via INTRAVENOUS
  Filled 2019-04-02: qty 250

## 2019-04-02 MED ORDER — SODIUM CHLORIDE 0.9 % IV SOLN
20.0000 mg | Freq: Once | INTRAVENOUS | Status: AC
  Administered 2019-04-02: 20 mg via INTRAVENOUS
  Filled 2019-04-02: qty 20

## 2019-04-02 MED ORDER — FAMOTIDINE IN NACL 20-0.9 MG/50ML-% IV SOLN
20.0000 mg | Freq: Once | INTRAVENOUS | Status: AC
  Administered 2019-04-02: 20 mg via INTRAVENOUS

## 2019-04-02 MED ORDER — DIPHENHYDRAMINE HCL 50 MG/ML IJ SOLN
INTRAMUSCULAR | Status: AC
  Filled 2019-04-02: qty 1

## 2019-04-02 MED ORDER — DIPHENHYDRAMINE HCL 50 MG/ML IJ SOLN
50.0000 mg | Freq: Once | INTRAMUSCULAR | Status: AC
  Administered 2019-04-02: 50 mg via INTRAVENOUS

## 2019-04-02 MED ORDER — PALONOSETRON HCL INJECTION 0.25 MG/5ML
0.2500 mg | Freq: Once | INTRAVENOUS | Status: AC
  Administered 2019-04-02: 0.25 mg via INTRAVENOUS

## 2019-04-02 MED ORDER — PALONOSETRON HCL INJECTION 0.25 MG/5ML
INTRAVENOUS | Status: AC
  Filled 2019-04-02: qty 5

## 2019-04-02 NOTE — Patient Instructions (Signed)
[facility] Discharge Instructions for Patients Receiving Chemotherapy  Today you received the following chemotherapy agents:  Taxol, Carboplatin  To help prevent nausea and vomiting after your treatment, we encourage you to take your nausea medication as prescribed.   If you develop nausea and vomiting that is not controlled by your nausea medication, call the clinic.   BELOW ARE SYMPTOMS THAT SHOULD BE REPORTED IMMEDIATELY:  *FEVER GREATER THAN 100.5 F  *CHILLS WITH OR WITHOUT FEVER  NAUSEA AND VOMITING THAT IS NOT CONTROLLED WITH YOUR NAUSEA MEDICATION  *UNUSUAL SHORTNESS OF BREATH  *UNUSUAL BRUISING OR BLEEDING  TENDERNESS IN MOUTH AND THROAT WITH OR WITHOUT PRESENCE OF ULCERS  *URINARY PROBLEMS  *BOWEL PROBLEMS  UNUSUAL RASH Items with * indicate a potential emergency and should be followed up as soon as possible.  Feel free to call the clinic should you have any questions or concerns. The clinic phone number is [phone].  Please show the CHEMO ALERT CARD at check-in to the Emergency Department and triage nurse.   

## 2019-04-03 ENCOUNTER — Other Ambulatory Visit: Payer: Self-pay

## 2019-04-03 ENCOUNTER — Ambulatory Visit
Admission: RE | Admit: 2019-04-03 | Discharge: 2019-04-03 | Disposition: A | Discharge status: Home / Self Care | Payer: Medicare Other | Source: Ambulatory Visit | Attending: Radiation Oncology | Admitting: Radiation Oncology

## 2019-04-03 DIAGNOSIS — C3490 Malignant neoplasm of unspecified part of unspecified bronchus or lung: Secondary | ICD-10-CM | POA: Diagnosis not present

## 2019-04-03 DIAGNOSIS — C3411 Malignant neoplasm of upper lobe, right bronchus or lung: Secondary | ICD-10-CM | POA: Diagnosis not present

## 2019-04-03 DIAGNOSIS — Z510 Encounter for antineoplastic radiation therapy: Secondary | ICD-10-CM | POA: Diagnosis not present

## 2019-04-04 ENCOUNTER — Other Ambulatory Visit: Payer: Self-pay

## 2019-04-04 ENCOUNTER — Ambulatory Visit
Admission: RE | Admit: 2019-04-04 | Discharge: 2019-04-04 | Disposition: A | Discharge status: Home / Self Care | Payer: Medicare Other | Source: Ambulatory Visit | Attending: Radiation Oncology | Admitting: Radiation Oncology

## 2019-04-04 DIAGNOSIS — C3411 Malignant neoplasm of upper lobe, right bronchus or lung: Secondary | ICD-10-CM | POA: Diagnosis not present

## 2019-04-04 DIAGNOSIS — Z510 Encounter for antineoplastic radiation therapy: Secondary | ICD-10-CM | POA: Diagnosis not present

## 2019-04-04 DIAGNOSIS — C3490 Malignant neoplasm of unspecified part of unspecified bronchus or lung: Secondary | ICD-10-CM | POA: Diagnosis not present

## 2019-04-05 ENCOUNTER — Ambulatory Visit
Admission: RE | Admit: 2019-04-05 | Discharge: 2019-04-05 | Disposition: A | Discharge status: Home / Self Care | Payer: Medicare Other | Source: Ambulatory Visit | Attending: Physician Assistant | Admitting: Physician Assistant

## 2019-04-05 ENCOUNTER — Other Ambulatory Visit (HOSPITAL_COMMUNITY)
Admission: RE | Admit: 2019-04-05 | Discharge: 2019-04-05 | Disposition: A | Discharge status: Home / Self Care | Payer: Medicare Other | Source: Ambulatory Visit | Attending: Physician Assistant | Admitting: Physician Assistant

## 2019-04-05 ENCOUNTER — Other Ambulatory Visit: Payer: Self-pay

## 2019-04-05 ENCOUNTER — Ambulatory Visit
Admission: RE | Admit: 2019-04-05 | Discharge: 2019-04-05 | Disposition: A | Discharge status: Home / Self Care | Payer: Medicare Other | Source: Ambulatory Visit | Attending: Radiation Oncology | Admitting: Radiation Oncology

## 2019-04-05 DIAGNOSIS — E041 Nontoxic single thyroid nodule: Secondary | ICD-10-CM | POA: Diagnosis not present

## 2019-04-05 DIAGNOSIS — C3490 Malignant neoplasm of unspecified part of unspecified bronchus or lung: Secondary | ICD-10-CM | POA: Diagnosis not present

## 2019-04-05 DIAGNOSIS — Z510 Encounter for antineoplastic radiation therapy: Secondary | ICD-10-CM | POA: Diagnosis not present

## 2019-04-05 DIAGNOSIS — D440 Neoplasm of uncertain behavior of thyroid gland: Secondary | ICD-10-CM | POA: Insufficient documentation

## 2019-04-05 DIAGNOSIS — D497 Neoplasm of unspecified behavior of endocrine glands and other parts of nervous system: Secondary | ICD-10-CM | POA: Diagnosis not present

## 2019-04-05 DIAGNOSIS — C3411 Malignant neoplasm of upper lobe, right bronchus or lung: Secondary | ICD-10-CM | POA: Diagnosis not present

## 2019-04-06 ENCOUNTER — Ambulatory Visit
Admission: RE | Admit: 2019-04-06 | Discharge: 2019-04-06 | Disposition: A | Discharge status: Home / Self Care | Payer: Medicare Other | Source: Ambulatory Visit | Attending: Radiation Oncology | Admitting: Radiation Oncology

## 2019-04-06 ENCOUNTER — Other Ambulatory Visit: Payer: Self-pay

## 2019-04-06 DIAGNOSIS — C3490 Malignant neoplasm of unspecified part of unspecified bronchus or lung: Secondary | ICD-10-CM | POA: Diagnosis not present

## 2019-04-06 DIAGNOSIS — Z510 Encounter for antineoplastic radiation therapy: Secondary | ICD-10-CM | POA: Diagnosis not present

## 2019-04-06 DIAGNOSIS — C3411 Malignant neoplasm of upper lobe, right bronchus or lung: Secondary | ICD-10-CM | POA: Diagnosis not present

## 2019-04-06 LAB — CYTOLOGY - NON PAP

## 2019-04-09 ENCOUNTER — Inpatient Hospital Stay: Payer: Medicare Other | Admitting: Internal Medicine

## 2019-04-09 ENCOUNTER — Ambulatory Visit
Admission: RE | Admit: 2019-04-09 | Discharge: 2019-04-09 | Disposition: A | Discharge status: Home / Self Care | Payer: Medicare Other | Source: Ambulatory Visit | Attending: Radiation Oncology | Admitting: Radiation Oncology

## 2019-04-09 ENCOUNTER — Inpatient Hospital Stay: Payer: Medicare Other

## 2019-04-09 ENCOUNTER — Encounter: Payer: Self-pay | Admitting: Internal Medicine

## 2019-04-09 ENCOUNTER — Telehealth: Payer: Self-pay | Admitting: Internal Medicine

## 2019-04-09 ENCOUNTER — Other Ambulatory Visit: Payer: Self-pay

## 2019-04-09 VITALS — BP 131/82 | HR 82 | Temp 98.5°F | Resp 18 | Ht 67.0 in | Wt 200.1 lb

## 2019-04-09 DIAGNOSIS — C3411 Malignant neoplasm of upper lobe, right bronchus or lung: Secondary | ICD-10-CM | POA: Diagnosis not present

## 2019-04-09 DIAGNOSIS — Z510 Encounter for antineoplastic radiation therapy: Secondary | ICD-10-CM | POA: Diagnosis not present

## 2019-04-09 DIAGNOSIS — I10 Essential (primary) hypertension: Secondary | ICD-10-CM | POA: Diagnosis not present

## 2019-04-09 DIAGNOSIS — C61 Malignant neoplasm of prostate: Secondary | ICD-10-CM | POA: Diagnosis not present

## 2019-04-09 DIAGNOSIS — C3490 Malignant neoplasm of unspecified part of unspecified bronchus or lung: Secondary | ICD-10-CM | POA: Diagnosis not present

## 2019-04-09 DIAGNOSIS — C3491 Malignant neoplasm of unspecified part of right bronchus or lung: Secondary | ICD-10-CM | POA: Diagnosis not present

## 2019-04-09 DIAGNOSIS — Z79899 Other long term (current) drug therapy: Secondary | ICD-10-CM | POA: Diagnosis not present

## 2019-04-09 DIAGNOSIS — E785 Hyperlipidemia, unspecified: Secondary | ICD-10-CM | POA: Diagnosis not present

## 2019-04-09 DIAGNOSIS — C73 Malignant neoplasm of thyroid gland: Secondary | ICD-10-CM | POA: Diagnosis not present

## 2019-04-09 DIAGNOSIS — Z5111 Encounter for antineoplastic chemotherapy: Secondary | ICD-10-CM | POA: Diagnosis not present

## 2019-04-09 DIAGNOSIS — E041 Nontoxic single thyroid nodule: Secondary | ICD-10-CM | POA: Diagnosis not present

## 2019-04-09 LAB — CBC WITH DIFFERENTIAL (CANCER CENTER ONLY)
Abs Immature Granulocytes: 0.01 10*3/uL (ref 0.00–0.07)
Basophils Absolute: 0 10*3/uL (ref 0.0–0.1)
Basophils Relative: 0 %
Eosinophils Absolute: 0.1 10*3/uL (ref 0.0–0.5)
Eosinophils Relative: 2 %
HCT: 40 % (ref 39.0–52.0)
Hemoglobin: 13 g/dL (ref 13.0–17.0)
Immature Granulocytes: 0 %
Lymphocytes Relative: 22 %
Lymphs Abs: 0.6 10*3/uL — ABNORMAL LOW (ref 0.7–4.0)
MCH: 27.6 pg (ref 26.0–34.0)
MCHC: 32.5 g/dL (ref 30.0–36.0)
MCV: 84.9 fL (ref 80.0–100.0)
Monocytes Absolute: 0.2 10*3/uL (ref 0.1–1.0)
Monocytes Relative: 8 %
Neutro Abs: 2 10*3/uL (ref 1.7–7.7)
Neutrophils Relative %: 68 %
Platelet Count: 199 10*3/uL (ref 150–400)
RBC: 4.71 MIL/uL (ref 4.22–5.81)
RDW: 15.5 % (ref 11.5–15.5)
WBC Count: 2.9 10*3/uL — ABNORMAL LOW (ref 4.0–10.5)
nRBC: 0 % (ref 0.0–0.2)

## 2019-04-09 LAB — CMP (CANCER CENTER ONLY)
ALT: 30 U/L (ref 0–44)
AST: 17 U/L (ref 15–41)
Albumin: 3.8 g/dL (ref 3.5–5.0)
Alkaline Phosphatase: 70 U/L (ref 38–126)
Anion gap: 10 (ref 5–15)
BUN: 14 mg/dL (ref 8–23)
CO2: 25 mmol/L (ref 22–32)
Calcium: 8.9 mg/dL (ref 8.9–10.3)
Chloride: 104 mmol/L (ref 98–111)
Creatinine: 0.97 mg/dL (ref 0.61–1.24)
GFR, Est AFR Am: >60 mL/min (ref >60)
GFR, Estimated: >60 mL/min (ref >60)
Glucose, Bld: 118 mg/dL — ABNORMAL HIGH (ref 70–99)
Potassium: 4.2 mmol/L (ref 3.5–5.1)
Sodium: 139 mmol/L (ref 135–145)
Total Bilirubin: 0.6 mg/dL (ref 0.3–1.2)
Total Protein: 6.9 g/dL (ref 6.5–8.1)

## 2019-04-09 MED ORDER — PALONOSETRON HCL INJECTION 0.25 MG/5ML
INTRAVENOUS | Status: AC
  Filled 2019-04-09: qty 5

## 2019-04-09 MED ORDER — DIPHENHYDRAMINE HCL 50 MG/ML IJ SOLN
50.0000 mg | Freq: Once | INTRAMUSCULAR | Status: AC
  Administered 2019-04-09: 14:00:00 50 mg via INTRAVENOUS

## 2019-04-09 MED ORDER — SODIUM CHLORIDE 0.9 % IV SOLN
204.6000 mg | Freq: Once | INTRAVENOUS | Status: AC
  Administered 2019-04-09: 200 mg via INTRAVENOUS
  Filled 2019-04-09: qty 20

## 2019-04-09 MED ORDER — FAMOTIDINE IN NACL 20-0.9 MG/50ML-% IV SOLN
INTRAVENOUS | Status: AC
  Filled 2019-04-09: qty 50

## 2019-04-09 MED ORDER — SODIUM CHLORIDE 0.9 % IV SOLN
Freq: Once | INTRAVENOUS | Status: AC
  Administered 2019-04-09: 14:00:00 via INTRAVENOUS
  Filled 2019-04-09: qty 250

## 2019-04-09 MED ORDER — DIPHENHYDRAMINE HCL 50 MG/ML IJ SOLN
INTRAMUSCULAR | Status: AC
  Filled 2019-04-09: qty 1

## 2019-04-09 MED ORDER — FAMOTIDINE IN NACL 20-0.9 MG/50ML-% IV SOLN
20.0000 mg | Freq: Once | INTRAVENOUS | Status: AC
  Administered 2019-04-09: 20 mg via INTRAVENOUS

## 2019-04-09 MED ORDER — PALONOSETRON HCL INJECTION 0.25 MG/5ML
0.2500 mg | Freq: Once | INTRAVENOUS | Status: AC
  Administered 2019-04-09: 0.25 mg via INTRAVENOUS

## 2019-04-09 MED ORDER — SODIUM CHLORIDE 0.9 % IV SOLN
20.0000 mg | Freq: Once | INTRAVENOUS | Status: AC
  Administered 2019-04-09: 20 mg via INTRAVENOUS
  Filled 2019-04-09: qty 20

## 2019-04-09 MED ORDER — SODIUM CHLORIDE 0.9 % IV SOLN
45.0000 mg/m2 | Freq: Once | INTRAVENOUS | Status: AC
  Administered 2019-04-09: 16:00:00 90 mg via INTRAVENOUS
  Filled 2019-04-09: qty 15

## 2019-04-09 NOTE — Telephone Encounter (Signed)
Scheduled appt per 11/16 los. ° °Printed calendar and avs. °

## 2019-04-09 NOTE — Progress Notes (Signed)
[place] [phone]   [phone]  OFFICE PROGRESS NOTE  [name], MD [address] [place] [address]  DIAGNOSIS:  1) Stage IIIA (T2b, N2, M0) non-small cell lung cancer likely adenocarcinoma pending final tissue diagnosis presented with right upper lobe lung mass in addition to right hilar and mediastinal lymphadenopathy diagnosed in September 2020 2) history of prostate adenocarcinoma status post radiotherapy under the care of Dr. Tammi Klippel. 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  PRIOR THERAPY: None  CURRENT THERAPY: Concurrent chemoradiation with weekly carboplatin for AUC of 2 and paclitaxel 45 mg/M2.  First dose March 05, 2019.  Status post 5 cycles.  INTERVAL HISTORY: Carl Harrell 73 y.o. male returns to the clinic today for follow-up visit.  The patient is feeling fine today with no concerning complaints except for fatigue.  He denied having any current chest pain but has shortness of breath with exertion with no cough or hemoptysis.  He denied having any fever or chills.  He has no nausea, vomiting, diarrhea or constipation.  He denied having any headache or visual changes.  He has no recent weight loss or night sweats.  He has been tolerating his concurrent chemoradiation fairly well.  The patient underwent fine-needle aspiration of the left thyroid nodule and the final pathology was consistent with follicular neoplasm Bethesda category IV.  He is here today for evaluation and discussion of his treatment option.  MEDICAL HISTORY: Past Medical History:  Diagnosis Date   ALLERGIC RHINITIS 01/30/2007   Arthritis    "hands" (05/29/2014)   Cancer ([place])    lung, prostate   Constipation    Coronary artery disease    LHC (05/29/14): pLAD 50-70, pRCA 99 (L>R collats), EF 55% >> PCI: 3.5 x 28 mm Xience Alpine DES to pRCA   Cough secondary to angiotensin  converting enzyme inhibitor (ACE-I) 05/28/2013   ED (erectile dysfunction)    GERD 09/26/2007   HYPERLIPIDEMIA 09/26/2007   Hypertension    PLANTAR FASCIITIS, BILATERAL 07/08/2009   Unspecified hearing loss 06/16/2009   wears aides both ears    ALLERGIES:  is allergic to lisinopril.  MEDICATIONS:  Current Outpatient Medications  Medication Sig Dispense Refill   benzonatate (TESSALON) 100 MG capsule Take 1 capsule (100 mg total) by mouth 2 (two) times daily as needed for cough. (Patient not taking: Reported on 03/26/2019) 20 capsule 0   cetirizine (ZYRTEC) 10 MG tablet Take 1 tablet (10 mg total) by mouth daily. 30 tablet 11   Cyanocobalamin (VITAMIN B-12 CR PO) Take by mouth daily.     ezetimibe (ZETIA) 10 MG tablet TAKE 1 TABLET BY MOUTH EVERY DAY (Patient taking differently: Take 10 mg by mouth daily. ) 90 tablet 3   fluticasone (FLONASE) 50 MCG/ACT nasal spray SHAKE LIQUID AND USE 1 SPRAY IN EACH NOSTRIL DAILY (Patient taking differently: Place 1 spray into both nostrils daily. ) 48 g 0   hydrochlorothiazide (MICROZIDE) 12.5 MG capsule Take 1 capsule (12.5 mg total) by mouth daily. 90 capsule 3   losartan (COZAAR) 50 MG tablet Take 1 tablet (50 mg total) by mouth daily. 90 tablet 3   metoprolol tartrate (LOPRESSOR) 25 MG tablet TAKE 1 TABLET BY MOUTH  TWICE DAILY (Patient taking differently: Take 25 mg by mouth 2 (two) times daily. ) 180 tablet 1   Multiple Vitamin (MULTIVITAMIN WITH MINERALS) TABS tablet Take 1 tablet by mouth  daily.     NITROSTAT 0.4 MG SL tablet PLACE 1 TABLET BY MOUTH UNDER TONGUE EVERY 5 MINUTES AS NEEDED FOR CHEST PAIN 25 tablet 12   omeprazole (PRILOSEC) 40 MG capsule TAKE 1 CAPSULE(40 MG) BY MOUTH DAILY (Patient taking differently: Take 40 mg by mouth daily. ) 90 capsule 1   prochlorperazine (COMPAZINE) 10 MG tablet Take 1 tablet (10 mg total) by mouth every 6 (six) hours as needed for nausea or vomiting. 30 tablet 0   simvastatin (ZOCOR) 40 MG tablet  TAKE 1 TABLET(40 MG) BY MOUTH DAILY AT 6 PM (Patient taking differently: Take 40 mg by mouth daily at 6 PM. ) 90 tablet 3   tamsulosin (FLOMAX) 0.4 MG CAPS capsule Take 0.4 mg by mouth daily.     No current facility-administered medications for this visit.     SURGICAL HISTORY:  Past Surgical History:  Procedure Laterality Date   CARDIAC CATHETERIZATION  05/29/2014   Procedure: CORONARY STENT INTERVENTION;  Surgeon: Sinclair Grooms, MD;  Location: [facility] CATH LAB;  Service: Cardiovascular;;  Prox RCA   COLONOSCOPY     CORONARY ANGIOPLASTY WITH STENT PLACEMENT  05/29/2014   "1"   KNEE ARTHROSCOPY Right ~ 2005   LEFT HEART CATHETERIZATION WITH CORONARY ANGIOGRAM N/A 05/29/2014   Procedure: LEFT HEART CATHETERIZATION WITH CORONARY ANGIOGRAM;  Surgeon: Sinclair Grooms, MD;  Location: [facility] CATH LAB;  Service: Cardiovascular;  Laterality: N/A;   VIDEO BRONCHOSCOPY WITH ENDOBRONCHIAL ULTRASOUND N/A 02/20/2019   Procedure: VIDEO BRONCHOSCOPY WITH ENDOBRONCHIAL ULTRASOUND;  Surgeon: Garner Nash, DO;  Location: [facility] OR;  Service: Thoracic;  Laterality: N/A;    REVIEW OF SYSTEMS:  Constitutional: positive for fatigue Eyes: negative Ears, nose, mouth, throat, and face: negative Respiratory: positive for dyspnea on exertion Cardiovascular: negative Gastrointestinal: negative Genitourinary:negative Integument/breast: negative Hematologic/lymphatic: negative Musculoskeletal:negative Neurological: negative Behavioral/Psych: negative Endocrine: negative Allergic/Immunologic: negative   PHYSICAL EXAMINATION: General appearance: alert, cooperative, fatigued and no distress Head: Normocephalic, without obvious abnormality, atraumatic Neck: no adenopathy, no JVD, supple, symmetrical, trachea midline and thyroid not enlarged, symmetric, no tenderness/mass/nodules Lymph nodes: Cervical, supraclavicular, and axillary nodes normal. Resp: clear to auscultation bilaterally Back: symmetric, no  curvature. ROM normal. No CVA tenderness. Cardio: regular rate and rhythm, S1, S2 normal, no murmur, click, rub or gallop GI: soft, non-tender; bowel sounds normal; no masses,  no organomegaly Extremities: extremities normal, atraumatic, no cyanosis or edema Neurologic: Alert and oriented X 3, normal strength and tone. Normal symmetric reflexes. Normal coordination and gait  ECOG PERFORMANCE STATUS: 1 - Symptomatic but completely ambulatory  Blood pressure 131/82, pulse 82, temperature 98.5 F (36.9 C), temperature source Temporal, resp. rate 18, height 5\' 7"  (1.702 m), weight 200 lb 1.6 oz (90.8 kg), SpO2 98 %.  LABORATORY DATA: Lab Results  Component Value Date   WBC 2.9 (L) 04/09/2019   HGB 13.0 04/09/2019   HCT 40.0 04/09/2019   MCV 84.9 04/09/2019   PLT 199 04/09/2019      Chemistry      Component Value Date/Time   NA 139 04/02/2019 1156   NA 141 08/05/2016 0826   K 3.8 04/02/2019 1156   CL 106 04/02/2019 1156   CO2 22 04/02/2019 1156   BUN 9 04/02/2019 1156   BUN 14 08/05/2016 0826   CREATININE 1.01 04/02/2019 1156      Component Value Date/Time   CALCIUM 8.9 04/02/2019 1156   ALKPHOS 72 04/02/2019 1156   AST 16 04/02/2019 1156   ALT 26  04/02/2019 1156   BILITOT 0.7 04/02/2019 1156       RADIOGRAPHIC STUDIES: [place] Fna Bx Thyroid 1st Lesion Afirma  Result Date: 04/05/2019 INDICATION: Patient with history of right lung mass, likely adenocarcinoma, and hypermetabolic left inferior thyroid nodule on PET scan ; subsequent thyroid ultrasound on 03/16/2019 revealed a 3 cm left inferior nodule which meets criteria for biopsy. He presents today for the procedure. EXAM: ULTRASOUND GUIDED FINE NEEDLE ASPIRATION BIOPSY OF LEFT INFERIOR THYROID NODULE COMPARISON:  Thyroid ultrasound dated 03/16/2019 MEDICATIONS: None COMPLICATIONS: None immediate. TECHNIQUE: Informed written consent was obtained from the patient after a discussion of the risks, benefits and alternatives to  treatment. Questions regarding the procedure were encouraged and answered. A timeout was performed prior to the initiation of the procedure. Pre-procedural ultrasound scanning demonstrated unchanged size and appearance of the indeterminate nodule within the left inferior thyroid lobe The procedure was planned. The neck was prepped in the usual sterile fashion, and a sterile drape was applied covering the operative field. A timeout was performed prior to the initiation of the procedure. Local anesthesia was provided with 1% lidocaine. Under direct ultrasound guidance, 5 FNA biopsies were performed of the left inferior thyroid nodule with 25 gauge needles. Multiple ultrasound images were saved for procedural documentation purposes. The samples were prepared and submitted to pathology as well as for Afirma testing. Limited post procedural scanning was negative for hematoma or additional complication. Dressings were placed. The patient tolerated the above procedures procedure well without immediate postprocedural complication. FINDINGS: Nodule reference number based on prior diagnostic ultrasound: 1 Maximum size: 3.0 cm Location: Left; Inferior ACR TI-RADS risk category: TR4 (4-6 points) Reason for biopsy: meets ACR TI-RADS criteria Ultrasound imaging confirms appropriate placement of the needles within the thyroid nodule. IMPRESSION: Technically successful ultrasound guided fine needle aspiration biopsy of left inferior thyroid nodule. Final pathology pending. Read by: Rowe Robert, PA-C Electronically Signed   By: Aletta Edouard M.D.   On: 04/05/2019 16:26   US Thyroid  Result Date: 03/17/2019 CLINICAL DATA:  Left inferior thyroid nodule by PET-CT with metabolic activity EXAM: THYROID ULTRASOUND TECHNIQUE: Ultrasound examination of the thyroid gland and adjacent soft tissues was performed. COMPARISON:  02/23/2019 PET-CT FINDINGS: Parenchymal Echotexture: Mildly heterogenous Isthmus: 3 mm Right lobe: 3.7 x 1.3 x  0.8 cm Left lobe: 4.2 x 2.7 x 2.7 cm _________________________________________________________ Estimated total number of nodules >/= 1 cm: 1 Number of spongiform nodules >/=  2 cm not described below (TR1): 0 Number of mixed cystic and solid nodules >/= 1.5 cm not described below ([place]): 0 _________________________________________________________ Nodule # 1: Location: Left; Inferior Maximum size: 3.0 cm; Other 2 dimensions: 2.3 x 2.4 cm Composition: solid/almost completely solid (2) Echogenicity: isoechoic (1) Shape: not taller-than-wide (0) Margins: extra-thyroidal extension (3) Echogenic foci: none (0) ACR TI-RADS total points: 6. ACR TI-RADS risk category: TR4 (4-6 points). ACR TI-RADS recommendations: **Given size (>/= 1.5 cm) and appearance, fine needle aspiration of this moderately suspicious nodule should be considered based on TI-RADS criteria. _________________________________________________________ Minor thyroid heterogeneity. Subcentimeter benign cystic nodules in the right lobe noted. No hypervascularity. No regional adenopathy. IMPRESSION: 3 cm left inferior TR 4 nodule meets criteria for biopsy as above. This correlates with the abnormal PET CT finding. The above is in keeping with the ACR TI-RADS recommendations - J Am Coll Radiol 2017;14:587-595. Electronically Signed   By: Jerilynn Mages.  Shick M.D.   On: 03/17/2019 12:05    ASSESSMENT AND PLAN: This is a very pleasant 73 years  old white male with recently diagnosed a stage IIIa (T2b, N2, M0) non-small cell lung cancer favoring adenocarcinoma presented with right upper lobe lung mass in addition to right hilar and mediastinal lymphadenopathy diagnosed in September 2020. The patient is currently undergoing concurrent chemoradiation with weekly carboplatin and paclitaxel status post 5 cycles.  The patient has been tolerating this treatment well with no concerning complaints except for fatigue. I recommended for him to proceed with cycle #6 today as planned.   He is expected to complete this course of treatment next week. I will arrange for the patient to come back for follow-up visit in 1 months for evaluation with repeat CT scan of the chest for restaging of his disease. Regarding the new diagnosis of follicular neoplasm of the thyroid, I will refer the patient to Dr. Armandina Gemma with central [facility]ina surgery for evaluation and consideration of surgical resection. The patient was advised to call immediately if he has any other concerning symptoms in the interval. All questions were answered. The patient knows to call the clinic with any problems, questions or concerns. We can certainly see the patient much sooner if necessary.  I spent 15 minutes counseling the patient face to face. The total time spent in the appointment was 25 minutes.  Disclaimer: This note was dictated with voice recognition software. Similar sounding words can inadvertently be transcribed and may not be corrected upon review.

## 2019-04-09 NOTE — Patient Instructions (Signed)
[facility] Discharge Instructions for Patients Receiving Chemotherapy  Today you received the following chemotherapy agents: Paclitaxel (Taxol), Carboplatin  To help prevent nausea and vomiting after your treatment, we encourage you to take your nausea medication as directed by your MD.   If you develop nausea and vomiting that is not controlled by your nausea medication, call the clinic.   BELOW ARE SYMPTOMS THAT SHOULD BE REPORTED IMMEDIATELY:  *FEVER GREATER THAN 100.5 F  *CHILLS WITH OR WITHOUT FEVER  NAUSEA AND VOMITING THAT IS NOT CONTROLLED WITH YOUR NAUSEA MEDICATION  *UNUSUAL SHORTNESS OF BREATH  *UNUSUAL BRUISING OR BLEEDING  TENDERNESS IN MOUTH AND THROAT WITH OR WITHOUT PRESENCE OF ULCERS  *URINARY PROBLEMS  *BOWEL PROBLEMS  UNUSUAL RASH Items with * indicate a potential emergency and should be followed up as soon as possible.  Feel free to call the clinic should you have any questions or concerns. The clinic phone number is [phone].  Please show the [facility] at check-in to the Emergency Department and triage nurse. Coronavirus (COVID-19) Are you at risk?  Are you at risk for the Coronavirus (COVID-19)?  To be considered HIGH RISK for Coronavirus (COVID-19), you have to meet the following criteria:  . Traveled to [place], [place], [place], [place] or [place]; or in the [place] to [place], [place], [place], or [place]; and have fever, cough, and shortness of breath within the last 2 weeks of travel OR . Been in close contact with a person diagnosed with COVID-19 within the last 2 weeks and have fever, cough, and shortness of breath . IF YOU DO NOT MEET THESE CRITERIA, YOU ARE CONSIDERED LOW RISK FOR COVID-19.  What to do if you are HIGH RISK for COVID-19?  Marland Kitchen If you are having a medical emergency, call 911. . Seek medical care right away. Before you go to a doctor's office, urgent care or emergency department, call  ahead and tell them about your recent travel, contact with someone diagnosed with COVID-19, and your symptoms. You should receive instructions from your physician's office regarding next steps of care.  . When you arrive at healthcare provider, tell the healthcare staff immediately you have returned from visiting [place], [place], [place], [place] or [place]; or traveled in the [place] to [place], [place], [place], or [place]; in the last two weeks or you have been in close contact with a person diagnosed with COVID-19 in the last 2 weeks.   . Tell the health care staff about your symptoms: fever, cough and shortness of breath. . After you have been seen by a medical provider, you will be either: o Tested for (COVID-19) and discharged home on quarantine except to seek medical care if symptoms worsen, and asked to  - Stay home and avoid contact with others until you get your results (4-5 days)  - Avoid travel on public transportation if possible (such as bus, train, or airplane) or o Sent to the Emergency Department by EMS for evaluation, COVID-19 testing, and possible admission depending on your condition and test results.  What to do if you are LOW RISK for COVID-19?  Reduce your risk of any infection by using the same precautions used for avoiding the common cold or flu:  Marland Kitchen Wash your hands often with soap and warm water for at least 20 seconds.  If soap and water are not readily available, use an alcohol-based hand sanitizer with at least 60% alcohol.  Marland Kitchen  If coughing or sneezing, cover your mouth and nose by coughing or sneezing into the elbow areas of your shirt or coat, into a tissue or into your sleeve (not your hands). . Avoid shaking hands with others and consider head nods or verbal greetings only. . Avoid touching your eyes, nose, or mouth with unwashed hands.  . Avoid close contact with people who are sick. . Avoid places or events with large numbers of people in one location,  like concerts or sporting events. . Carefully consider travel plans you have or are making. . If you are planning any travel outside or inside the [place], visit the CDC's Travelers' Health webpage for the latest health notices. . If you have some symptoms but not all symptoms, continue to monitor at home and seek medical attention if your symptoms worsen. . If you are having a medical emergency, call 911.   [facility] / e-Visit: [email or web address]         [facility]: [facility]: [phone]                   [facility]: [phone]

## 2019-04-10 ENCOUNTER — Ambulatory Visit
Admission: RE | Admit: 2019-04-10 | Discharge: 2019-04-10 | Disposition: A | Discharge status: Home / Self Care | Payer: Medicare Other | Source: Ambulatory Visit | Attending: Radiation Oncology | Admitting: Radiation Oncology

## 2019-04-10 ENCOUNTER — Other Ambulatory Visit: Payer: Self-pay

## 2019-04-10 DIAGNOSIS — C3411 Malignant neoplasm of upper lobe, right bronchus or lung: Secondary | ICD-10-CM | POA: Diagnosis not present

## 2019-04-10 DIAGNOSIS — C3490 Malignant neoplasm of unspecified part of unspecified bronchus or lung: Secondary | ICD-10-CM | POA: Diagnosis not present

## 2019-04-10 DIAGNOSIS — Z510 Encounter for antineoplastic radiation therapy: Secondary | ICD-10-CM | POA: Diagnosis not present

## 2019-04-11 ENCOUNTER — Ambulatory Visit
Admission: RE | Admit: 2019-04-11 | Discharge: 2019-04-11 | Disposition: A | Discharge status: Home / Self Care | Payer: Medicare Other | Source: Ambulatory Visit | Attending: Radiation Oncology | Admitting: Radiation Oncology

## 2019-04-11 ENCOUNTER — Other Ambulatory Visit: Payer: Self-pay

## 2019-04-11 DIAGNOSIS — Z510 Encounter for antineoplastic radiation therapy: Secondary | ICD-10-CM | POA: Diagnosis not present

## 2019-04-11 DIAGNOSIS — C3490 Malignant neoplasm of unspecified part of unspecified bronchus or lung: Secondary | ICD-10-CM | POA: Diagnosis not present

## 2019-04-11 DIAGNOSIS — C3411 Malignant neoplasm of upper lobe, right bronchus or lung: Secondary | ICD-10-CM | POA: Diagnosis not present

## 2019-04-12 ENCOUNTER — Other Ambulatory Visit: Payer: Self-pay

## 2019-04-12 ENCOUNTER — Ambulatory Visit
Admission: RE | Admit: 2019-04-12 | Discharge: 2019-04-12 | Disposition: A | Discharge status: Home / Self Care | Payer: Medicare Other | Source: Ambulatory Visit | Attending: Radiation Oncology | Admitting: Radiation Oncology

## 2019-04-12 DIAGNOSIS — C3490 Malignant neoplasm of unspecified part of unspecified bronchus or lung: Secondary | ICD-10-CM | POA: Diagnosis not present

## 2019-04-12 DIAGNOSIS — C3411 Malignant neoplasm of upper lobe, right bronchus or lung: Secondary | ICD-10-CM | POA: Diagnosis not present

## 2019-04-12 DIAGNOSIS — E041 Nontoxic single thyroid nodule: Secondary | ICD-10-CM | POA: Diagnosis not present

## 2019-04-12 DIAGNOSIS — Z510 Encounter for antineoplastic radiation therapy: Secondary | ICD-10-CM | POA: Diagnosis not present

## 2019-04-13 ENCOUNTER — Other Ambulatory Visit: Payer: Self-pay | Admitting: Radiation Oncology

## 2019-04-13 ENCOUNTER — Other Ambulatory Visit: Payer: Self-pay

## 2019-04-13 ENCOUNTER — Ambulatory Visit
Admission: RE | Admit: 2019-04-13 | Discharge: 2019-04-13 | Disposition: A | Discharge status: Home / Self Care | Payer: Medicare Other | Source: Ambulatory Visit | Attending: Radiation Oncology | Admitting: Radiation Oncology

## 2019-04-13 DIAGNOSIS — C3490 Malignant neoplasm of unspecified part of unspecified bronchus or lung: Secondary | ICD-10-CM | POA: Diagnosis not present

## 2019-04-13 DIAGNOSIS — C3411 Malignant neoplasm of upper lobe, right bronchus or lung: Secondary | ICD-10-CM | POA: Diagnosis not present

## 2019-04-13 DIAGNOSIS — Z510 Encounter for antineoplastic radiation therapy: Secondary | ICD-10-CM | POA: Diagnosis not present

## 2019-04-13 MED ORDER — TAMSULOSIN HCL 0.4 MG PO CAPS: 0.4000 mg | ORAL_CAPSULE | Freq: Every day | ORAL | 5 refills | Status: DC

## 2019-04-15 ENCOUNTER — Other Ambulatory Visit: Payer: Self-pay

## 2019-04-15 ENCOUNTER — Ambulatory Visit
Admission: RE | Admit: 2019-04-15 | Discharge: 2019-04-15 | Disposition: A | Discharge status: Home / Self Care | Payer: Medicare Other | Source: Ambulatory Visit | Attending: Radiation Oncology | Admitting: Radiation Oncology

## 2019-04-15 DIAGNOSIS — Z510 Encounter for antineoplastic radiation therapy: Secondary | ICD-10-CM | POA: Diagnosis not present

## 2019-04-15 DIAGNOSIS — C3490 Malignant neoplasm of unspecified part of unspecified bronchus or lung: Secondary | ICD-10-CM | POA: Diagnosis not present

## 2019-04-15 DIAGNOSIS — C3411 Malignant neoplasm of upper lobe, right bronchus or lung: Secondary | ICD-10-CM | POA: Diagnosis not present

## 2019-04-16 ENCOUNTER — Inpatient Hospital Stay: Payer: Medicare Other

## 2019-04-16 ENCOUNTER — Other Ambulatory Visit: Payer: Self-pay

## 2019-04-16 ENCOUNTER — Ambulatory Visit
Admission: RE | Admit: 2019-04-16 | Discharge: 2019-04-16 | Disposition: A | Discharge status: Home / Self Care | Payer: Medicare Other | Source: Ambulatory Visit | Attending: Radiation Oncology | Admitting: Radiation Oncology

## 2019-04-16 DIAGNOSIS — C3411 Malignant neoplasm of upper lobe, right bronchus or lung: Secondary | ICD-10-CM | POA: Diagnosis not present

## 2019-04-16 DIAGNOSIS — Z5111 Encounter for antineoplastic chemotherapy: Secondary | ICD-10-CM | POA: Diagnosis not present

## 2019-04-16 DIAGNOSIS — Z510 Encounter for antineoplastic radiation therapy: Secondary | ICD-10-CM | POA: Diagnosis not present

## 2019-04-16 DIAGNOSIS — E041 Nontoxic single thyroid nodule: Secondary | ICD-10-CM | POA: Diagnosis not present

## 2019-04-16 DIAGNOSIS — E785 Hyperlipidemia, unspecified: Secondary | ICD-10-CM | POA: Diagnosis not present

## 2019-04-16 DIAGNOSIS — C3491 Malignant neoplasm of unspecified part of right bronchus or lung: Secondary | ICD-10-CM

## 2019-04-16 DIAGNOSIS — C3490 Malignant neoplasm of unspecified part of unspecified bronchus or lung: Secondary | ICD-10-CM | POA: Diagnosis not present

## 2019-04-16 DIAGNOSIS — I10 Essential (primary) hypertension: Secondary | ICD-10-CM | POA: Diagnosis not present

## 2019-04-16 DIAGNOSIS — Z79899 Other long term (current) drug therapy: Secondary | ICD-10-CM | POA: Diagnosis not present

## 2019-04-16 LAB — CBC WITH DIFFERENTIAL (CANCER CENTER ONLY)
Abs Immature Granulocytes: 0.05 10*3/uL (ref 0.00–0.07)
Basophils Absolute: 0 10*3/uL (ref 0.0–0.1)
Basophils Relative: 1 %
Eosinophils Absolute: 0 10*3/uL (ref 0.0–0.5)
Eosinophils Relative: 2 %
HCT: 36.3 % — ABNORMAL LOW (ref 39.0–52.0)
Hemoglobin: 11.9 g/dL — ABNORMAL LOW (ref 13.0–17.0)
Immature Granulocytes: 3 %
Lymphocytes Relative: 28 %
Lymphs Abs: 0.5 10*3/uL — ABNORMAL LOW (ref 0.7–4.0)
MCH: 27.4 pg (ref 26.0–34.0)
MCHC: 32.8 g/dL (ref 30.0–36.0)
MCV: 83.4 fL (ref 80.0–100.0)
Monocytes Absolute: 0.4 10*3/uL (ref 0.1–1.0)
Monocytes Relative: 20 %
Neutro Abs: 0.9 10*3/uL — ABNORMAL LOW (ref 1.7–7.7)
Neutrophils Relative %: 46 %
Platelet Count: 320 10*3/uL (ref 150–400)
RBC: 4.35 MIL/uL (ref 4.22–5.81)
RDW: 16.2 % — ABNORMAL HIGH (ref 11.5–15.5)
WBC Count: 1.9 10*3/uL — ABNORMAL LOW (ref 4.0–10.5)
nRBC: 0 % (ref 0.0–0.2)

## 2019-04-16 LAB — CMP (CANCER CENTER ONLY)
ALT: 26 U/L (ref 0–44)
AST: 16 U/L (ref 15–41)
Albumin: 3.5 g/dL (ref 3.5–5.0)
Alkaline Phosphatase: 65 U/L (ref 38–126)
Anion gap: 10 (ref 5–15)
BUN: 12 mg/dL (ref 8–23)
CO2: 23 mmol/L (ref 22–32)
Calcium: 8.5 mg/dL — ABNORMAL LOW (ref 8.9–10.3)
Chloride: 106 mmol/L (ref 98–111)
Creatinine: 0.99 mg/dL (ref 0.61–1.24)
GFR, Est AFR Am: >60 mL/min (ref >60)
GFR, Estimated: >60 mL/min (ref >60)
Glucose, Bld: 152 mg/dL — ABNORMAL HIGH (ref 70–99)
Potassium: 3.7 mmol/L (ref 3.5–5.1)
Sodium: 139 mmol/L (ref 135–145)
Total Bilirubin: 0.4 mg/dL (ref 0.3–1.2)
Total Protein: 6.4 g/dL — ABNORMAL LOW (ref 6.5–8.1)

## 2019-04-16 NOTE — Progress Notes (Signed)
Verbal order from Dr. Julien Nordmann: Hold treatment today due to [place] of 0.9 and WBC of 1.9, since this was supposed to be patient's last treatment, he does not need to come back for for a make up treatment.  Patient informed and he verbalized understanding.

## 2019-04-16 NOTE — Patient Instructions (Signed)
[facility] Discharge Instructions for Patients Receiving Chemotherapy  Today you received the following chemotherapy agents:  Taxol, Carboplatin  To help prevent nausea and vomiting after your treatment, we encourage you to take your nausea medication as prescribed.   If you develop nausea and vomiting that is not controlled by your nausea medication, call the clinic.   BELOW ARE SYMPTOMS THAT SHOULD BE REPORTED IMMEDIATELY:  *FEVER GREATER THAN 100.5 F  *CHILLS WITH OR WITHOUT FEVER  NAUSEA AND VOMITING THAT IS NOT CONTROLLED WITH YOUR NAUSEA MEDICATION  *UNUSUAL SHORTNESS OF BREATH  *UNUSUAL BRUISING OR BLEEDING  TENDERNESS IN MOUTH AND THROAT WITH OR WITHOUT PRESENCE OF ULCERS  *URINARY PROBLEMS  *BOWEL PROBLEMS  UNUSUAL RASH Items with * indicate a potential emergency and should be followed up as soon as possible.  Feel free to call the clinic should you have any questions or concerns. The clinic phone number is [phone].  Please show the CHEMO ALERT CARD at check-in to the Emergency Department and triage nurse.   

## 2019-04-17 ENCOUNTER — Ambulatory Visit
Admission: RE | Admit: 2019-04-17 | Discharge: 2019-04-17 | Disposition: A | Discharge status: Home / Self Care | Payer: Medicare Other | Source: Ambulatory Visit | Attending: Radiation Oncology | Admitting: Radiation Oncology

## 2019-04-17 ENCOUNTER — Encounter: Payer: Self-pay | Admitting: Radiation Oncology

## 2019-04-17 ENCOUNTER — Ambulatory Visit: Payer: Medicare Other

## 2019-04-17 ENCOUNTER — Other Ambulatory Visit: Payer: Self-pay

## 2019-04-17 DIAGNOSIS — C3490 Malignant neoplasm of unspecified part of unspecified bronchus or lung: Secondary | ICD-10-CM | POA: Diagnosis not present

## 2019-04-17 DIAGNOSIS — C3411 Malignant neoplasm of upper lobe, right bronchus or lung: Secondary | ICD-10-CM | POA: Diagnosis not present

## 2019-04-17 DIAGNOSIS — Z510 Encounter for antineoplastic radiation therapy: Secondary | ICD-10-CM | POA: Diagnosis not present

## 2019-04-18 ENCOUNTER — Ambulatory Visit: Payer: Medicare Other

## 2019-04-18 ENCOUNTER — Telehealth: Payer: Self-pay | Admitting: Internal Medicine

## 2019-04-18 NOTE — Telephone Encounter (Signed)
R/s appt per 11/25 sch message - pt is aware of appt date and time

## 2019-04-21 ENCOUNTER — Other Ambulatory Visit (HOSPITAL_COMMUNITY)
Admission: RE | Admit: 2019-04-21 | Discharge: 2019-04-21 | Disposition: A | Discharge status: Home / Self Care | Payer: Medicare Other | Source: Ambulatory Visit | Attending: Pulmonary Disease | Admitting: Pulmonary Disease

## 2019-04-21 DIAGNOSIS — Z01812 Encounter for preprocedural laboratory examination: Secondary | ICD-10-CM | POA: Insufficient documentation

## 2019-04-21 DIAGNOSIS — Z20828 Contact with and (suspected) exposure to other viral communicable diseases: Secondary | ICD-10-CM | POA: Diagnosis not present

## 2019-04-22 LAB — NOVEL CORONAVIRUS, NAA (HOSP ORDER, SEND-OUT TO REF LAB; TAT 18-24 HRS): SARS-CoV-2, NAA: NOT DETECTED

## 2019-04-25 ENCOUNTER — Other Ambulatory Visit: Payer: Self-pay

## 2019-04-25 ENCOUNTER — Encounter: Payer: Self-pay | Admitting: Pulmonary Disease

## 2019-04-25 ENCOUNTER — Ambulatory Visit (INDEPENDENT_AMBULATORY_CARE_PROVIDER_SITE_OTHER): Payer: Medicare Other | Admitting: Pulmonary Disease

## 2019-04-25 ENCOUNTER — Ambulatory Visit: Payer: Medicare Other | Admitting: Pulmonary Disease

## 2019-04-25 VITALS — BP 128/86 | HR 112 | Temp 97.2°F | Ht 67.0 in | Wt 205.0 lb

## 2019-04-25 DIAGNOSIS — R942 Abnormal results of pulmonary function studies: Secondary | ICD-10-CM | POA: Diagnosis not present

## 2019-04-25 DIAGNOSIS — R918 Other nonspecific abnormal finding of lung field: Secondary | ICD-10-CM

## 2019-04-25 DIAGNOSIS — R590 Localized enlarged lymph nodes: Secondary | ICD-10-CM

## 2019-04-25 DIAGNOSIS — R059 Cough, unspecified: Secondary | ICD-10-CM

## 2019-04-25 DIAGNOSIS — C3491 Malignant neoplasm of unspecified part of right bronchus or lung: Secondary | ICD-10-CM

## 2019-04-25 DIAGNOSIS — R05 Cough: Secondary | ICD-10-CM | POA: Diagnosis not present

## 2019-04-25 LAB — PULMONARY FUNCTION TEST
DL/VA % pred: 117 %
DL/VA: 4.72 ml/min/mmHg/L
DLCO cor % pred: 123 %
DLCO cor: 28.34 ml/min/mmHg
DLCO unc % pred: 113 %
DLCO unc: 25.91 ml/min/mmHg
FEF 25-75 Post: 1.99 L/sec
FEF 25-75 Pre: 1.77 L/sec
FEF2575-%Change-Post: 12 %
FEF2575-%Pred-Post: 101 %
FEF2575-%Pred-Pre: 90 %
FEV1-%Change-Post: 2 %
FEV1-%Pred-Post: 91 %
FEV1-%Pred-Pre: 89 %
FEV1-Post: 2.47 L
FEV1-Pre: 2.4 L
FEV1FVC-%Change-Post: -2 %
FEV1FVC-%Pred-Pre: 100 %
FEV6-%Change-Post: 5 %
FEV6-%Pred-Post: 99 %
FEV6-%Pred-Pre: 93 %
FEV6-Post: 3.47 L
FEV6-Pre: 3.28 L
FEV6FVC-%Change-Post: 0 %
FEV6FVC-%Pred-Post: 107 %
FEV6FVC-%Pred-Pre: 106 %
FVC-%Change-Post: 5 %
FVC-%Pred-Post: 92 %
FVC-%Pred-Pre: 87 %
FVC-Post: 3.47 L
FVC-Pre: 3.29 L
Post FEV1/FVC ratio: 71 %
Post FEV6/FVC ratio: 100 %
Pre FEV1/FVC ratio: 73 %
Pre FEV6/FVC Ratio: 100 %
RV % pred: 111 %
RV: 2.66 L
TLC % pred: 101 %
TLC: 6.55 L

## 2019-04-25 NOTE — Progress Notes (Signed)
Full PFT performed today. °

## 2019-04-25 NOTE — Patient Instructions (Addendum)
Thank you for visiting Dr. Valeta Harms at [facility]. Today we recommend the following:  Keep follow up appts with oncology  Please call with any breathing problems.   Return in about 3 months (around 07/24/2019) for with APP or Dr. Valeta Harms.    Please do your part to reduce the spread of COVID-19.

## 2019-04-25 NOTE — Progress Notes (Signed)
Synopsis: Referred in October 2020 for lung mass, Dr. Earlie Server, PCP: [name], MD  Subjective:   PATIENT ID: Carl Harrell GENDER: male DOB: [birth year], MRN: [id number]  Chief Complaint  Patient presents with  . Follow-up    8wk f/u after radiation, chemo and PFT. States he has been feeling ok. Has been to take daily walks with his dog.     Patient was initially seen by Dr. Earlie Server on 02/13/2019 after CT imaging revealed a large lung mass.  CT imaging revealed a right upper lobe lesion consistent with a primary lung neoplasm.  The patient was taken for bronchoscopy on 02/20/2019.  Video bronchoscopy with endobronchial ultrasound biopsies of station 4R was completed as well as within the right upper lobe endobronchial mass extending from the opening of the right upper lobe.   OV 02/27/2019: Today in the office patient doing well after bronchoscopy.  Occasionally having a few flecks of hemoptysis.  He is anxious about all of his diagnosis.  PET scan reviewed with patient today in the office as well as brain MRI results.  He has a pending pathology from the case done last week.  I have sent a message to our pathology department to figure out when the final path results will be posted.  Per imaging of PET scan would be consistent with a T2 a N2 M0 disease.  Of note he does have endobronchial disease on bronchoscopy invading the airway of the right upper lobe.  OV 04/25/2019: Patient seen in the day the office following chemo plus XRT.  Doing well.  His breathing is stable.  Patient had pulmonary function test prior to the office visit today with a normal FEV1 2.47 L, 91% predicted, normal lung capacity and mildly elevated DLCO 113% predicted.  Overall doing well today.  Able to walk his dog.  He is interested in going back to attempt to play 9 holes at a par 3, iron play.  Patient denies fevers chills night sweats weight loss.  He has repeat CT imaging scheduled for December 14 and a follow-up  visit with Dr. Earlie Server on December 16.  He has not had any hemoptysis.      Past Medical History:  Diagnosis Date  . ALLERGIC RHINITIS 01/30/2007  . Arthritis    "hands" (05/29/2014)  . Cancer ([place])    lung, prostate  . Constipation   . Coronary artery disease    LHC (05/29/14): pLAD 50-70, pRCA 99 (L>R collats), EF 55% >> PCI: 3.5 x 28 mm Xience [employer]pine DES to [employer]  . Cough secondary to angiotensin converting enzyme inhibitor (ACE-I) 05/28/2013  . ED (erectile dysfunction)   . GERD 09/26/2007  . HYPERLIPIDEMIA 09/26/2007  . Hypertension   . PLANTAR FASCIITIS, BILATERAL 07/08/2009  . Unspecified hearing loss 06/16/2009   wears aides both ears     Family History  Problem Relation Age of Onset  . Heart attack Mother   . Heart disease Mother   . Heart attack Father   . Heart disease Father   . COPD Neg Hx        family hx  . Hyperlipidemia Neg Hx        family hx     Past Surgical History:  Procedure Laterality Date  . CARDIAC CATHETERIZATION  05/29/2014   Procedure: CORONARY STENT INTERVENTION;  Surgeon: Sinclair Grooms, MD;  Location: [facility] CATH LAB;  Service: Cardiovascular;;  Prox RCA  . COLONOSCOPY    . CORONARY ANGIOPLASTY  WITH STENT PLACEMENT  05/29/2014   "1"  . KNEE ARTHROSCOPY Right ~ 2005  . LEFT HEART CATHETERIZATION WITH CORONARY ANGIOGRAM N/A 05/29/2014   Procedure: LEFT HEART CATHETERIZATION WITH CORONARY ANGIOGRAM;  Surgeon: Sinclair Grooms, MD;  Location: [facility] CATH LAB;  Service: Cardiovascular;  Laterality: N/A;  . VIDEO BRONCHOSCOPY WITH ENDOBRONCHIAL ULTRASOUND N/A 02/20/2019   Procedure: VIDEO BRONCHOSCOPY WITH ENDOBRONCHIAL ULTRASOUND;  Surgeon: Garner Nash, DO;  Location: [facility] OR;  Service: Thoracic;  Laterality: N/A;    Social History   Socioeconomic History  . Marital status: Married    Spouse name: Not on file  . Number of children: Not on file  . Years of education: Not on file  . Highest education level: Not on file  Occupational History  . Not on  file  Social Needs  . Financial resource strain: Not on file  . Food insecurity    Worry: Not on file    Inability: Not on file  . Transportation needs    Medical: Not on file    Non-medical: Not on file  Tobacco Use  . Smoking status: Former Smoker    Packs/day: 0.20    Years: 15.00    Pack years: 3.00    Types: Cigarettes    Quit date: 05/24/1978    Years since quitting: 40.9  . Smokeless tobacco: Never Used  . Tobacco comment: 05/29/2014 "smoked socially; nothing since 1980"  Substance and Sexual Activity  . Alcohol use: Yes    Alcohol/week: 2.0 standard drinks    Types: 2 Shots of liquor per week    Comment: beer or two a week  . Drug use: No  . Sexual activity: Not Currently  Lifestyle  . Physical activity    Days per week: Not on file    Minutes per session: Not on file  . Stress: Not on file  Relationships  . Social Herbalist on phone: Not on file    Gets together: Not on file    Attends religious service: Not on file    Active member of club or organization: Not on file    Attends meetings of clubs or organizations: Not on file    Relationship status: Not on file  . Intimate partner violence    Fear of current or ex partner: Not on file    Emotionally abused: Not on file    Physically abused: Not on file    Forced sexual activity: Not on file  Other Topics Concern  . Not on file  Social History Narrative   Lives with wife in a one story home.  Has 2 children.  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  Education: 3 years of college.    Does wood working.      Allergies  Allergen Reactions  . Lisinopril Cough     Outpatient Medications Prior to Visit  Medication Sig Dispense Refill  . cetirizine (ZYRTEC) 10 MG tablet Take 1 tablet (10 mg total) by mouth daily. 30 tablet 11  . Cyanocobalamin (VITAMIN B-12 CR PO) Take by mouth daily.    Marland Kitchen ezetimibe (ZETIA) 10 MG tablet TAKE 1 TABLET BY MOUTH EVERY DAY (Patient taking differently: Take 10 mg by mouth daily. ) 90  tablet 3  . fluticasone (FLONASE) 50 MCG/ACT nasal spray SHAKE LIQUID AND USE 1 SPRAY IN EACH NOSTRIL DAILY (Patient taking differently: Place 1 spray into both nostrils daily. ) 48 g 0  . hydrochlorothiazide (MICROZIDE) 12.5 MG capsule Take 1  capsule (12.5 mg total) by mouth daily. 90 capsule 3  . losartan (COZAAR) 50 MG tablet Take 1 tablet (50 mg total) by mouth daily. 90 tablet 3  . metoprolol tartrate (LOPRESSOR) 25 MG tablet TAKE 1 TABLET BY MOUTH  TWICE DAILY (Patient taking differently: Take 25 mg by mouth 2 (two) times daily. ) 180 tablet 1  . Multiple Vitamin (MULTIVITAMIN WITH MINERALS) TABS tablet Take 1 tablet by mouth daily.    Marland Kitchen omeprazole (PRILOSEC) 40 MG capsule TAKE 1 CAPSULE(40 MG) BY MOUTH DAILY (Patient taking differently: Take 40 mg by mouth daily. ) 90 capsule 1  . simvastatin (ZOCOR) 40 MG tablet TAKE 1 TABLET(40 MG) BY MOUTH DAILY AT 6 PM (Patient taking differently: Take 40 mg by mouth daily at 6 PM. ) 90 tablet 3  . tamsulosin (FLOMAX) 0.4 MG CAPS capsule Take 1 capsule (0.4 mg total) by mouth daily. 30 capsule 5  . benzonatate (TESSALON) 100 MG capsule Take 1 capsule (100 mg total) by mouth 2 (two) times daily as needed for cough. (Patient not taking: Reported on 03/26/2019) 20 capsule 0  . NITROSTAT 0.4 MG SL tablet PLACE 1 TABLET BY MOUTH UNDER TONGUE EVERY 5 MINUTES AS NEEDED FOR CHEST PAIN (Patient not taking: Reported on 04/09/2019) 25 tablet 12  . prochlorperazine (COMPAZINE) 10 MG tablet Take 1 tablet (10 mg total) by mouth every 6 (six) hours as needed for nausea or vomiting. (Patient not taking: Reported on 04/09/2019) 30 tablet 0   No facility-administered medications prior to visit.     Review of Systems  Constitutional: Negative for chills, fever, malaise/fatigue and weight loss.  HENT: Negative for hearing loss, sore throat and tinnitus.   Eyes: Negative for blurred vision and double vision.  Respiratory: Positive for cough, sputum production and shortness  of breath. Negative for hemoptysis, wheezing and stridor.   Cardiovascular: Negative for chest pain, palpitations, orthopnea, leg swelling and PND.  Gastrointestinal: Negative for abdominal pain, constipation, diarrhea, heartburn, nausea and vomiting.  Genitourinary: Negative for dysuria, hematuria and urgency.  Musculoskeletal: Negative for joint pain and myalgias.  Skin: Negative for itching and rash.  Neurological: Negative for dizziness, tingling, weakness and headaches.  Endo/Heme/Allergies: Negative for environmental allergies. Does not bruise/bleed easily.  Psychiatric/Behavioral: Negative for depression. The patient is not nervous/anxious and does not have insomnia.   All other systems reviewed and are negative.    Objective:  Physical Exam Vitals signs reviewed.  Constitutional:      General: He is not in acute distress.    Appearance: He is well-developed. He is obese.  HENT:     Head: Normocephalic and atraumatic.  Eyes:     General: No scleral icterus.    Conjunctiva/sclera: Conjunctivae normal.     Pupils: Pupils are equal, round, and reactive to light.  Neck:     Musculoskeletal: Neck supple.     Vascular: No JVD.     Trachea: No tracheal deviation.  Cardiovascular:     Rate and Rhythm: Normal rate and regular rhythm.     Heart sounds: Normal heart sounds. No murmur.  Pulmonary:     Effort: Pulmonary effort is normal. No tachypnea, accessory muscle usage or respiratory distress.     Breath sounds: Normal breath sounds. No stridor. No wheezing, rhonchi or rales.  Abdominal:     General: There is no distension.     Palpations: Abdomen is soft.     Tenderness: There is no abdominal tenderness.  Musculoskeletal:  General: No tenderness.     Right lower leg: No edema.     Left lower leg: No edema.  Lymphadenopathy:     Cervical: No cervical adenopathy.  Skin:    General: Skin is warm and dry.     Capillary Refill: Capillary refill takes less than 2  seconds.     Findings: No rash.  Neurological:     Mental Status: He is alert and oriented to person, place, and time.  Psychiatric:        Behavior: Behavior normal.      Vitals:   04/25/19 1149  BP: 128/86  Pulse: (!) 112  Temp: (!) 97.2 F (36.2 C)  TempSrc: Temporal  SpO2: 97%  Weight: 205 lb (93 kg)  Height: 5\' 7"  (1.702 m)   97% on RA BMI Readings from Last 3 Encounters:  04/25/19 32.11 kg/m  04/09/19 31.34 kg/m  03/26/19 31.18 kg/m   Wt Readings from Last 3 Encounters:  04/25/19 205 lb (93 kg)  04/09/19 200 lb 1.6 oz (90.8 kg)  03/26/19 199 lb 1.6 oz (90.3 kg)     CBC    Component Value Date/Time   WBC 1.9 (L) 04/16/2019 1341   WBC 8.0 07/18/2018 0948   RBC 4.35 04/16/2019 1341   HGB 11.9 (L) 04/16/2019 1341   HCT 36.3 (L) 04/16/2019 1341   PLT 320 04/16/2019 1341   MCV 83.4 04/16/2019 1341   MCH 27.4 04/16/2019 1341   MCHC 32.8 04/16/2019 1341   RDW 16.2 (H) 04/16/2019 1341   LYMPHSABS 0.5 (L) 04/16/2019 1341   MONOABS 0.4 04/16/2019 1341   EOSABS 0.0 04/16/2019 1341   BASOSABS 0.0 04/16/2019 1341     Chest Imaging: 02/23/2019: Nuclear medicine pet imaging right hilar metabolic lesion right upper lobe metabolic lesion consistent with T2 a N2 M0 The patient's images have been independently reviewed by me.    Brain MRI: No evidence of metastatic disease.  Pulmonary Functions Testing Results: PFT Results Latest Ref Rng & Units 04/25/2019  FVC-Pre L 3.29  FVC-Predicted Pre % 87  FVC-Post L 3.47  FVC-Predicted Post % 92  Pre FEV1/FVC % % 73  Post FEV1/FCV % % 71  FEV1-Pre L 2.40  FEV1-Predicted Pre % 89  FEV1-Post L 2.47  DLCO UNC% % 113  DLCO COR %Predicted % 117  TLC L 6.55  TLC % Predicted % 101  RV % Predicted % 111    FeNO: None   Pathology: None   Echocardiogram: None   Heart Catheterization: None   Bronchoscopy:      Assessment & Plan:     ICD-10-CM   1. Adenocarcinoma of right lung, stage 3 (HCC)  C34.91   2.  Lung mass  R91.8   3. Endobronchial cancer, right ([place])  C34.91   4. Abnormal PET of right lung  R94.2   5. Cough  R05     Discussion:  73 year old gentleman with T2 a N2 M0 stage IIIa lung cancer status post chemotherapy plus radiation.  Overall patient doing well currently being treated for adenocarcinoma.  Follows with Dr. Earlie Server in clinic and oncology.  As for his breathing it is stable.  He has not had any hemoptysis.  We will continue to observe clinically.  Plan: Patient should follow-up with Dr. Earlie Server on the 16th Already has CT scan imaging ordered and scheduled for 14 December. He can continue use of bronchodilators he does not have any evidence of COPD on pulmonary function test therefore no  inhalers at this time however can use as needed albuterol as needed. He currently has a cancer related cough.  Likely from his endobronchial disease and history of radiation. If needed could consider low-dose opiate cough suppressant to help in the future.  We discussed this today in the office right now he is able to handle his cough during the day with oral lozenges and is doing fine.  Encouraged him to get back to some form of daily exercise.  He is continuing to walk his dog.  He is also thinking about restarting his golf hobby.  Greater than 50% of this patient's 15-minute office visit was been face-to-face discussing above recommendations and treatment plan.    Current Outpatient Medications:  .  cetirizine (ZYRTEC) 10 MG tablet, Take 1 tablet (10 mg total) by mouth daily., Disp: 30 tablet, Rfl: 11 .  Cyanocobalamin (VITAMIN B-12 CR PO), Take by mouth daily., Disp: , Rfl:  .  ezetimibe (ZETIA) 10 MG tablet, TAKE 1 TABLET BY MOUTH EVERY DAY (Patient taking differently: Take 10 mg by mouth daily. ), Disp: 90 tablet, Rfl: 3 .  fluticasone (FLONASE) 50 MCG/ACT nasal spray, SHAKE LIQUID AND USE 1 SPRAY IN EACH NOSTRIL DAILY (Patient taking differently: Place 1 spray into both nostrils  daily. ), Disp: 48 g, Rfl: 0 .  hydrochlorothiazide (MICROZIDE) 12.5 MG capsule, Take 1 capsule (12.5 mg total) by mouth daily., Disp: 90 capsule, Rfl: 3 .  losartan (COZAAR) 50 MG tablet, Take 1 tablet (50 mg total) by mouth daily., Disp: 90 tablet, Rfl: 3 .  metoprolol tartrate (LOPRESSOR) 25 MG tablet, TAKE 1 TABLET BY MOUTH  TWICE DAILY (Patient taking differently: Take 25 mg by mouth 2 (two) times daily. ), Disp: 180 tablet, Rfl: 1 .  Multiple Vitamin (MULTIVITAMIN WITH MINERALS) TABS tablet, Take 1 tablet by mouth daily., Disp: , Rfl:  .  omeprazole (PRILOSEC) 40 MG capsule, TAKE 1 CAPSULE(40 MG) BY MOUTH DAILY (Patient taking differently: Take 40 mg by mouth daily. ), Disp: 90 capsule, Rfl: 1 .  simvastatin (ZOCOR) 40 MG tablet, TAKE 1 TABLET(40 MG) BY MOUTH DAILY AT 6 PM (Patient taking differently: Take 40 mg by mouth daily at 6 PM. ), Disp: 90 tablet, Rfl: 3 .  tamsulosin (FLOMAX) 0.4 MG CAPS capsule, Take 1 capsule (0.4 mg total) by mouth daily., Disp: 30 capsule, Rfl: 5   Garner Nash, DO [facility] 04/25/2019 12:13 PM

## 2019-04-27 ENCOUNTER — Encounter (HOSPITAL_COMMUNITY): Payer: Self-pay

## 2019-05-02 ENCOUNTER — Encounter: Payer: Self-pay | Admitting: *Deleted

## 2019-05-07 ENCOUNTER — Inpatient Hospital Stay: Payer: Medicare Other | Attending: Internal Medicine

## 2019-05-07 ENCOUNTER — Other Ambulatory Visit: Payer: Self-pay

## 2019-05-07 ENCOUNTER — Encounter (HOSPITAL_COMMUNITY): Payer: Self-pay

## 2019-05-07 ENCOUNTER — Ambulatory Visit (HOSPITAL_COMMUNITY)
Admission: RE | Admit: 2019-05-07 | Discharge: 2019-05-07 | Disposition: A | Discharge status: Home / Self Care | Payer: Medicare Other | Source: Ambulatory Visit | Attending: Internal Medicine | Admitting: Internal Medicine

## 2019-05-07 DIAGNOSIS — Z923 Personal history of irradiation: Secondary | ICD-10-CM | POA: Insufficient documentation

## 2019-05-07 DIAGNOSIS — E041 Nontoxic single thyroid nodule: Secondary | ICD-10-CM | POA: Diagnosis not present

## 2019-05-07 DIAGNOSIS — E785 Hyperlipidemia, unspecified: Secondary | ICD-10-CM | POA: Insufficient documentation

## 2019-05-07 DIAGNOSIS — C3490 Malignant neoplasm of unspecified part of unspecified bronchus or lung: Secondary | ICD-10-CM

## 2019-05-07 DIAGNOSIS — Z79899 Other long term (current) drug therapy: Secondary | ICD-10-CM | POA: Diagnosis not present

## 2019-05-07 DIAGNOSIS — I10 Essential (primary) hypertension: Secondary | ICD-10-CM | POA: Diagnosis not present

## 2019-05-07 DIAGNOSIS — Z5112 Encounter for antineoplastic immunotherapy: Secondary | ICD-10-CM | POA: Insufficient documentation

## 2019-05-07 DIAGNOSIS — C3411 Malignant neoplasm of upper lobe, right bronchus or lung: Secondary | ICD-10-CM | POA: Diagnosis not present

## 2019-05-07 DIAGNOSIS — Z5111 Encounter for antineoplastic chemotherapy: Secondary | ICD-10-CM | POA: Diagnosis not present

## 2019-05-07 HISTORY — DX: Malignant neoplasm of prostate: C61

## 2019-05-07 HISTORY — DX: Malignant neoplasm of unspecified part of unspecified bronchus or lung: C34.90

## 2019-05-07 LAB — CBC WITH DIFFERENTIAL (CANCER CENTER ONLY)
Abs Immature Granulocytes: 0.06 10*3/uL (ref 0.00–0.07)
Basophils Absolute: 0 10*3/uL (ref 0.0–0.1)
Basophils Relative: 0 %
Eosinophils Absolute: 0.9 10*3/uL — ABNORMAL HIGH (ref 0.0–0.5)
Eosinophils Relative: 11 %
HCT: 39.6 % (ref 39.0–52.0)
Hemoglobin: 12.8 g/dL — ABNORMAL LOW (ref 13.0–17.0)
Immature Granulocytes: 1 %
Lymphocytes Relative: 16 %
Lymphs Abs: 1.3 10*3/uL (ref 0.7–4.0)
MCH: 28.4 pg (ref 26.0–34.0)
MCHC: 32.3 g/dL (ref 30.0–36.0)
MCV: 87.8 fL (ref 80.0–100.0)
Monocytes Absolute: 1.2 10*3/uL — ABNORMAL HIGH (ref 0.1–1.0)
Monocytes Relative: 14 %
Neutro Abs: 4.6 10*3/uL (ref 1.7–7.7)
Neutrophils Relative %: 58 %
Platelet Count: 166 10*3/uL (ref 150–400)
RBC: 4.51 MIL/uL (ref 4.22–5.81)
RDW: 17.6 % — ABNORMAL HIGH (ref 11.5–15.5)
WBC Count: 8.1 10*3/uL (ref 4.0–10.5)
nRBC: 0 % (ref 0.0–0.2)

## 2019-05-07 LAB — CMP (CANCER CENTER ONLY)
ALT: 19 U/L (ref 0–44)
AST: 16 U/L (ref 15–41)
Albumin: 3.4 g/dL — ABNORMAL LOW (ref 3.5–5.0)
Alkaline Phosphatase: 75 U/L (ref 38–126)
Anion gap: 9 (ref 5–15)
BUN: 14 mg/dL (ref 8–23)
CO2: 27 mmol/L (ref 22–32)
Calcium: 8.9 mg/dL (ref 8.9–10.3)
Chloride: 102 mmol/L (ref 98–111)
Creatinine: 1.12 mg/dL (ref 0.61–1.24)
GFR, Est AFR Am: >60 mL/min (ref >60)
GFR, Estimated: >60 mL/min (ref >60)
Glucose, Bld: 115 mg/dL — ABNORMAL HIGH (ref 70–99)
Potassium: 3.9 mmol/L (ref 3.5–5.1)
Sodium: 138 mmol/L (ref 135–145)
Total Bilirubin: 0.7 mg/dL (ref 0.3–1.2)
Total Protein: 7 g/dL (ref 6.5–8.1)

## 2019-05-07 MED ORDER — IOHEXOL 300 MG/ML  SOLN
75.0000 mL | Freq: Once | INTRAMUSCULAR | Status: AC | PRN
  Administered 2019-05-07: 75 mL via INTRAVENOUS

## 2019-05-07 MED ORDER — SODIUM CHLORIDE (PF) 0.9 % IJ SOLN
INTRAMUSCULAR | Status: AC
  Filled 2019-05-07: qty 50

## 2019-05-09 ENCOUNTER — Encounter: Payer: Self-pay | Admitting: Internal Medicine

## 2019-05-09 ENCOUNTER — Ambulatory Visit: Payer: Self-pay | Admitting: Surgery

## 2019-05-09 ENCOUNTER — Other Ambulatory Visit: Payer: Self-pay

## 2019-05-09 ENCOUNTER — Inpatient Hospital Stay (HOSPITAL_BASED_OUTPATIENT_CLINIC_OR_DEPARTMENT_OTHER): Payer: Medicare Other | Admitting: Internal Medicine

## 2019-05-09 VITALS — BP 126/88 | HR 116 | Temp 98.2°F | Resp 20 | Ht 67.0 in | Wt 200.5 lb

## 2019-05-09 DIAGNOSIS — I10 Essential (primary) hypertension: Secondary | ICD-10-CM | POA: Diagnosis not present

## 2019-05-09 DIAGNOSIS — E785 Hyperlipidemia, unspecified: Secondary | ICD-10-CM | POA: Diagnosis not present

## 2019-05-09 DIAGNOSIS — D440 Neoplasm of uncertain behavior of thyroid gland: Secondary | ICD-10-CM | POA: Diagnosis not present

## 2019-05-09 DIAGNOSIS — C73 Malignant neoplasm of thyroid gland: Secondary | ICD-10-CM | POA: Diagnosis not present

## 2019-05-09 DIAGNOSIS — Z5112 Encounter for antineoplastic immunotherapy: Secondary | ICD-10-CM

## 2019-05-09 DIAGNOSIS — C3491 Malignant neoplasm of unspecified part of right bronchus or lung: Secondary | ICD-10-CM | POA: Diagnosis not present

## 2019-05-09 DIAGNOSIS — Z79899 Other long term (current) drug therapy: Secondary | ICD-10-CM | POA: Diagnosis not present

## 2019-05-09 DIAGNOSIS — Z923 Personal history of irradiation: Secondary | ICD-10-CM | POA: Diagnosis not present

## 2019-05-09 DIAGNOSIS — Z7189 Other specified counseling: Secondary | ICD-10-CM | POA: Diagnosis not present

## 2019-05-09 DIAGNOSIS — C3411 Malignant neoplasm of upper lobe, right bronchus or lung: Secondary | ICD-10-CM | POA: Diagnosis not present

## 2019-05-09 DIAGNOSIS — E041 Nontoxic single thyroid nodule: Secondary | ICD-10-CM | POA: Diagnosis not present

## 2019-05-09 NOTE — Progress Notes (Signed)
[place] [phone]   [phone]  OFFICE PROGRESS NOTE  [name], MD [address] [place] [address]  DIAGNOSIS:  1) Stage IIIA (T2b, N2, M0) non-small cell lung cancer likely adenocarcinoma pending final tissue diagnosis presented with right upper lobe lung mass in addition to right hilar and mediastinal lymphadenopathy diagnosed in September 2020 2) history of prostate adenocarcinoma status post radiotherapy under the care of Dr. Tammi Klippel. 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  PRIOR THERAPY: Concurrent chemoradiation with weekly carboplatin for AUC of 2 and paclitaxel 45 mg/M2.  First dose March 05, 2019.  Status post 7 cycles.  Last dose was given April 16, 2019 with partial response.  CURRENT THERAPY: Consolidation treatment with immunotherapy with Imfinzi 360 mg IV every 4 weeks.  First dose May 23, 2019.  INTERVAL HISTORY: Carl Harrell 73 y.o. male returns to the clinic today for follow-up visit.  The patient is feeling fine today with no concerning complaints except for dry cough and shortness of breath with exertion.  He also has fatigue that was getting worse after he completed the treatment.  He denied having any current chest pain, or hemoptysis.  He denied having any fever or chills.  He has no nausea, vomiting, diarrhea or constipation.  He denied having any significant weight loss or night sweats.  He has no headache or visual changes.  The patient had repeat CT scan of the chest performed recently and he is here for evaluation and discussion of his discuss results. He was seen recently by Dr. Dalbert Batman for evaluation of the left thyroid neoplasm and he has not scheduled the procedure yet.  MEDICAL HISTORY: Past Medical History:  Diagnosis Date  . ALLERGIC RHINITIS 01/30/2007  . Arthritis    "hands" (05/29/2014)  . Cancer ([place])    lung, prostate   . Constipation   . Coronary artery disease    LHC (05/29/14): pLAD 50-70, pRCA 99 (L>R collats), EF 55% >> PCI: 3.5 x 28 mm Xience [employer] DES to [employer]  . Cough secondary to angiotensin converting enzyme inhibitor (ACE-I) 05/28/2013  . ED (erectile dysfunction)   . GERD 09/26/2007  . HYPERLIPIDEMIA 09/26/2007  . Hypertension   . Lung cancer ([place]) dx'd 03/2018  . PLANTAR FASCIITIS, BILATERAL 07/08/2009  . Prostate CA ([place]) dx'd 2017 or 2018   xrt  . Unspecified hearing loss 06/16/2009   wears aides both ears    ALLERGIES:  is allergic to lisinopril.  MEDICATIONS:  Current Outpatient Medications  Medication Sig Dispense Refill  . cetirizine (ZYRTEC) 10 MG tablet Take 1 tablet (10 mg total) by mouth daily. 30 tablet 11  . Cyanocobalamin (VITAMIN B-12 CR PO) Take by mouth daily.    Marland Kitchen ezetimibe (ZETIA) 10 MG tablet TAKE 1 TABLET BY MOUTH EVERY DAY (Patient taking differently: Take 10 mg by mouth daily. ) 90 tablet 3  . fluticasone (FLONASE) 50 MCG/ACT nasal spray SHAKE LIQUID AND USE 1 SPRAY IN EACH NOSTRIL DAILY (Patient taking differently: Place 1 spray into both nostrils daily. ) 48 g 0  . hydrochlorothiazide (MICROZIDE) 12.5 MG capsule Take 1 capsule (12.5 mg total) by mouth daily. 90 capsule 3  . losartan (COZAAR) 50 MG tablet Take 1 tablet (50 mg total) by mouth daily. 90 tablet 3  . metoprolol tartrate (LOPRESSOR) 25 MG tablet TAKE 1 TABLET BY MOUTH  TWICE DAILY (Patient taking differently: Take  25 mg by mouth 2 (two) times daily. ) 180 tablet 1  . Multiple Vitamin (MULTIVITAMIN WITH MINERALS) TABS tablet Take 1 tablet by mouth daily.    Marland Kitchen omeprazole (PRILOSEC) 40 MG capsule TAKE 1 CAPSULE(40 MG) BY MOUTH DAILY (Patient taking differently: Take 40 mg by mouth daily. ) 90 capsule 1  . simvastatin (ZOCOR) 40 MG tablet TAKE 1 TABLET(40 MG) BY MOUTH DAILY AT 6 PM (Patient taking differently: Take 40 mg by mouth daily at 6 PM. ) 90 tablet 3  . tamsulosin (FLOMAX) 0.4 MG CAPS capsule Take 1  capsule (0.4 mg total) by mouth daily. 30 capsule 5   No current facility-administered medications for this visit.    SURGICAL HISTORY:  Past Surgical History:  Procedure Laterality Date  . CARDIAC CATHETERIZATION  05/29/2014   Procedure: CORONARY STENT INTERVENTION;  Surgeon: [place] Grooms, MD;  Location: [facility] CATH LAB;  Service: Cardiovascular;;  Prox RCA  . COLONOSCOPY    . CORONARY ANGIOPLASTY WITH STENT PLACEMENT  05/29/2014   "1"  . KNEE ARTHROSCOPY Right ~ 2005  . LEFT HEART CATHETERIZATION WITH CORONARY ANGIOGRAM N/A 05/29/2014   Procedure: LEFT HEART CATHETERIZATION WITH CORONARY ANGIOGRAM;  Surgeon: [place] Grooms, MD;  Location: [facility] CATH LAB;  Service: Cardiovascular;  Laterality: N/A;  . VIDEO BRONCHOSCOPY WITH ENDOBRONCHIAL ULTRASOUND N/A 02/20/2019   Procedure: VIDEO BRONCHOSCOPY WITH ENDOBRONCHIAL ULTRASOUND;  Surgeon: Garner Nash, DO;  Location: [facility] OR;  Service: Thoracic;  Laterality: N/A;    REVIEW OF SYSTEMS:  Constitutional: positive for fatigue Eyes: negative Ears, nose, mouth, throat, and face: negative Respiratory: positive for cough and dyspnea on exertion Cardiovascular: negative Gastrointestinal: negative Genitourinary:negative Integument/breast: negative Hematologic/lymphatic: negative Musculoskeletal:negative Neurological: negative Behavioral/Psych: negative Endocrine: negative Allergic/Immunologic: negative   PHYSICAL EXAMINATION: General appearance: alert, cooperative, fatigued and no distress Head: Normocephalic, without obvious abnormality, atraumatic Neck: no adenopathy, no JVD, supple, symmetrical, trachea midline and thyroid not enlarged, symmetric, no tenderness/mass/nodules Lymph nodes: Cervical, supraclavicular, and axillary nodes normal. Resp: clear to auscultation bilaterally Back: symmetric, no curvature. ROM normal. No CVA tenderness. Cardio: regular rate and rhythm, S1, S2 normal, no murmur, click, rub or gallop GI: soft,  non-tender; bowel sounds normal; no masses,  no organomegaly Extremities: extremities normal, atraumatic, no cyanosis or edema Neurologic: Alert and oriented X 3, normal strength and tone. Normal symmetric reflexes. Normal coordination and gait  ECOG PERFORMANCE STATUS: 1 - Symptomatic but completely ambulatory  Blood pressure 126/88, pulse (!) 116, temperature 98.2 F (36.8 C), temperature source Temporal, resp. rate 20, height 5\' 7"  (1.702 m), weight 200 lb 8 oz (90.9 kg), SpO2 98 %.  LABORATORY DATA: Lab Results  Component Value Date   WBC 8.1 05/07/2019   HGB 12.8 (L) 05/07/2019   HCT 39.6 05/07/2019   MCV 87.8 05/07/2019   PLT 166 05/07/2019      Chemistry      Component Value Date/Time   NA 138 05/07/2019 1127   NA 141 08/05/2016 0826   K 3.9 05/07/2019 1127   CL 102 05/07/2019 1127   CO2 27 05/07/2019 1127   BUN 14 05/07/2019 1127   BUN 14 08/05/2016 0826   CREATININE 1.12 05/07/2019 1127      Component Value Date/Time   CALCIUM 8.9 05/07/2019 1127   ALKPHOS 75 05/07/2019 1127   AST 16 05/07/2019 1127   ALT 19 05/07/2019 1127   BILITOT 0.7 05/07/2019 1127       RADIOGRAPHIC STUDIES: CT Chest W Contrast  Result Date: 05/07/2019 CLINICAL DATA:  Lung cancer. Radiation therapy and chemotherapy completed in November 2020. History of prostate cancer. Cough following radiation therapy. Shortness of breath on exertion. Fatigue and loss of appetite. EXAM: CT CHEST WITH CONTRAST TECHNIQUE: Multidetector CT imaging of the chest was performed during intravenous contrast administration. CONTRAST:  31mL OMNIPAQUE IOHEXOL 300 MG/ML  SOLN COMPARISON:  PET 02/23/2019 and CT chest 02/07/2019. FINDINGS: Cardiovascular: Atherosclerotic calcification of the aorta, aortic valve and coronary arteries. Heart size normal. No pericardial effusion. Mediastinum/Nodes: Heterogeneous dominant left thyroid nodule measures 3.1 cm and exerts mass effect on the trachea. Probable exophytic  substernal thyroid nodule, measuring 1.0 x 1.3 cm, also stable. Thyroid with evaluated sonographically on 03/17/2019. Low right paratracheal lymph node has decreased in size, now measuring 4 mm (2/54), previously 1.3 cm. Dominant right hilar lymph node measures 2.5 cm, decreased from 2.9 cm. No left hilar or axillary adenopathy. Esophagus is grossly unremarkable. Lungs/Pleura: Spiculated right upper lobe nodule has decreased in size, now measuring 1.3 x 2.1 cm (7/38), previously 3.0 x 3.1 cm on 02/07/2019. Soft tissue extends medially to the right hilum. There is new surrounding patchy ground-glass in the right upper and right lower lobes, in keeping with the provided history of radiation therapy. Subpleural nodular lesion in the anterior left upper lobe measures 6 mm (7/53) and is new. There may be mild basilar subpleural reticulation. 3 mm subpleural lymph node along the left major fissure, unchanged. No pleural fluid. Airway is unremarkable. Upper Abdomen: Liver may be slightly decreased in attenuation diffusely. Wedge-shaped area of low attenuation in the peripheral right hepatic lobe is unchanged and may represent an infarct. A stone is seen in the gallbladder. Adrenal glands may be minimally thickened, as before. Visualized portions of the kidneys, spleen, pancreas, stomach and bowel are unremarkable. No upper abdominal adenopathy. Musculoskeletal: Degenerative changes in the spine. No worrisome lytic or sclerotic lesions. There may be very mild compression of the T3 and T4 superior endplates, unchanged. IMPRESSION: 1. Interval response to therapy as evidenced by decrease in size of the primary right upper lobe mass and mediastinal/right hilar adenopathy. 2. Surrounding changes of radiation therapy in the right upper and right lower lobes. 3. New subpleural nodule in the anterior left upper lobe. Continued attention on follow-up exams is warranted as metastatic disease cannot be excluded. 4. Possible mild  basilar subpleural fibrosis. 5. Liver may be steatotic. 6. Cholelithiasis. 7. Aortic atherosclerosis (ICD10-170.0). Coronary artery calcification. Electronically Signed   By: Lorin Picket M.D.   On: 05/07/2019 14:54    ASSESSMENT AND PLAN: This is a very pleasant 73 years old white male with recently diagnosed a stage IIIa (T2b, N2, M0) non-small cell lung cancer favoring adenocarcinoma presented with right upper lobe lung mass in addition to right hilar and mediastinal lymphadenopathy diagnosed in September 2020. The patient underwent a course of concurrent chemoradiation with weekly carboplatin and paclitaxel status post 7 cycles.  He tolerated the treatment well except for fatigue and dry cough. He had repeat CT scan of the chest performed recently.  I personally and independently reviewed the scans and discussed the results with the patient and showed him the images today. His scan showed good response to the treatment and the left upper lobe lung mass as well as the right hilar and mediastinal adenopathy.  There was a development of 4 mm left upper lobe nodule that is nonspecific at this point but require attention on follow-up imaging studies. I had a lengthy discussion  with the patient today about his condition and treatment options. I recommended for the patient treatment with consolidation treatment with immunotherapy with Imfinzi 360 mg IV every 4 weeks for a total of 1 year unless the patient has disease progression or unacceptable toxicity.  I discussed with the patient the adverse effect of this treatment of this treatment including but not limited to immunotherapy mediated skin rash, diarrhea, inflammation of the lung, kidney, liver, thyroid or other endocrine dysfunction. He is expected to start the first dose of this treatment after Christmas. The patient will come back for follow-up visit in 6 weeks for evaluation with the start of cycle #2. For the follicular neoplasm of the thyroid,  he is followed by Dr. Dalbert Batman. He was advised to call immediately if he has any concerning symptoms in the interval.  All questions were answered. The patient knows to call the clinic with any problems, questions or concerns. We can certainly see the patient much sooner if necessary.  Disclaimer: This note was dictated with voice recognition software. Similar sounding words can inadvertently be transcribed and may not be corrected upon review.

## 2019-05-09 NOTE — Progress Notes (Signed)
DISCONTINUE ON PATHWAY REGIMEN - Non-Small Cell Lung     Administer weekly:     Paclitaxel      Carboplatin   **Always confirm dose/schedule in your pharmacy ordering system**  REASON: Continuation Of Treatment PRIOR TREATMENT: [id number]: Carboplatin AUC=2 + Paclitaxel 45 mg/m2 Weekly During Radiation TREATMENT RESPONSE: Partial Response (PR)  START ON PATHWAY REGIMEN - Non-Small Cell Lung     A cycle is every 14 days:     Durvalumab   **Always confirm dose/schedule in your pharmacy ordering system**  Patient Characteristics: Stage III - Unresectable, PS = 0, 1 AJCC T Category: T2b Current Disease Status: No Distant Mets or Local Recurrence AJCC N Category: N2 AJCC M Category: M0 AJCC 8 Stage Grouping: IIIA ECOG Performance Status: 1 Intent of Therapy: Curative Intent, Discussed with Patient

## 2019-05-10 ENCOUNTER — Telehealth: Payer: Self-pay | Admitting: Family Medicine

## 2019-05-10 NOTE — Telephone Encounter (Signed)
Message Routed to PCP CMA 

## 2019-05-10 NOTE — Telephone Encounter (Signed)
Copied from [place] [id number]. Topic: General - Other >> May 10, 2019  4:28 PM Keene Breath wrote: Reason for CRM: Patient called to request a referral to have a gallstone removed.  CB# [phone]

## 2019-05-11 ENCOUNTER — Other Ambulatory Visit: Payer: Self-pay

## 2019-05-11 DIAGNOSIS — K8020 Calculus of gallbladder without cholecystitis without obstruction: Secondary | ICD-10-CM

## 2019-05-11 NOTE — Telephone Encounter (Signed)
Referral placed, pt is aware

## 2019-05-11 NOTE — Telephone Encounter (Signed)
Patient is calling back to check on the status of the referral of removal of Gallstones. Patient had imaging with Cancer Doctor. Confirmed gallstones. Patient is in occasional pain in the floor with the gallstones. Please advise  [phone]

## 2019-05-11 NOTE — Telephone Encounter (Signed)
Please advise 

## 2019-05-11 NOTE — Telephone Encounter (Signed)
Imaging reviewed.  [place] for referral to gen surg.

## 2019-05-11 NOTE — Telephone Encounter (Signed)
Waiting on Dr Volanda Napoleon recommendation

## 2019-05-13 NOTE — Progress Notes (Signed)
  Radiation Oncology         [phone] ________________________________  Name: KAHIL AGNER MRN: [id number]  Date: 04/17/2019  DOB: 01-Jan-1946   End of Treatment Note  Diagnosis:   73 y.o. gentleman with newly diagnosed Stage IIIA (T2a, N2, M0) non-small cell lung cancer favoring adenocarcinoma     Indication for treatment:  Curative, Chemo-Radiotherapy       Radiation treatment dates:   03/05/19-04/17/19  Site/dose:   The primary tumor and involved mediastinal adenopathy were treated to 66 Gy in 33 fractions of 2 Gy.  Beams/energy:   A five field 3D conformal treatment arrangement was used delivering 6 and 10 MV photons.  Daily image-guidance CT was used to align the treatment with the targeted volume  Narrative: The patient tolerated radiation treatment relatively well.  The patient experienced some esophagitis characterized as mild.  The patient also noted fatigue.  Plan: The patient has completed radiation treatment. The patient will return to radiation oncology clinic for routine followup in one month. I advised him to call or return sooner if he has any questions or concerns related to his recovery or treatment.  ________________________________  Sheral Apley. Tammi Klippel, M.D.

## 2019-05-22 ENCOUNTER — Other Ambulatory Visit: Payer: Self-pay

## 2019-05-22 ENCOUNTER — Inpatient Hospital Stay: Payer: Medicare Other

## 2019-05-22 ENCOUNTER — Encounter: Payer: Self-pay | Admitting: Internal Medicine

## 2019-05-22 ENCOUNTER — Inpatient Hospital Stay: Payer: Medicare Other | Admitting: Internal Medicine

## 2019-05-22 VITALS — BP 116/79 | HR 92 | Temp 98.7°F | Resp 18 | Ht 67.0 in | Wt 197.5 lb

## 2019-05-22 DIAGNOSIS — C3491 Malignant neoplasm of unspecified part of right bronchus or lung: Secondary | ICD-10-CM | POA: Diagnosis not present

## 2019-05-22 DIAGNOSIS — Z923 Personal history of irradiation: Secondary | ICD-10-CM | POA: Diagnosis not present

## 2019-05-22 DIAGNOSIS — E041 Nontoxic single thyroid nodule: Secondary | ICD-10-CM | POA: Diagnosis not present

## 2019-05-22 DIAGNOSIS — E785 Hyperlipidemia, unspecified: Secondary | ICD-10-CM | POA: Diagnosis not present

## 2019-05-22 DIAGNOSIS — I10 Essential (primary) hypertension: Secondary | ICD-10-CM | POA: Diagnosis not present

## 2019-05-22 DIAGNOSIS — Z5112 Encounter for antineoplastic immunotherapy: Secondary | ICD-10-CM | POA: Diagnosis not present

## 2019-05-22 DIAGNOSIS — C3411 Malignant neoplasm of upper lobe, right bronchus or lung: Secondary | ICD-10-CM | POA: Diagnosis not present

## 2019-05-22 DIAGNOSIS — Z79899 Other long term (current) drug therapy: Secondary | ICD-10-CM | POA: Diagnosis not present

## 2019-05-22 LAB — CBC WITH DIFFERENTIAL (CANCER CENTER ONLY)
Abs Immature Granulocytes: 0.03 10*3/uL (ref 0.00–0.07)
Basophils Absolute: 0 10*3/uL (ref 0.0–0.1)
Basophils Relative: 0 %
Eosinophils Absolute: 0.5 10*3/uL (ref 0.0–0.5)
Eosinophils Relative: 7 %
HCT: 40.9 % (ref 39.0–52.0)
Hemoglobin: 13 g/dL (ref 13.0–17.0)
Immature Granulocytes: 1 %
Lymphocytes Relative: 16 %
Lymphs Abs: 1 10*3/uL (ref 0.7–4.0)
MCH: 28 pg (ref 26.0–34.0)
MCHC: 31.8 g/dL (ref 30.0–36.0)
MCV: 88.1 fL (ref 80.0–100.0)
Monocytes Absolute: 0.8 10*3/uL (ref 0.1–1.0)
Monocytes Relative: 12 %
Neutro Abs: 4.3 10*3/uL (ref 1.7–7.7)
Neutrophils Relative %: 64 %
Platelet Count: 300 10*3/uL (ref 150–400)
RBC: 4.64 MIL/uL (ref 4.22–5.81)
RDW: 16.9 % — ABNORMAL HIGH (ref 11.5–15.5)
WBC Count: 6.7 10*3/uL (ref 4.0–10.5)
nRBC: 0 % (ref 0.0–0.2)

## 2019-05-22 LAB — CMP (CANCER CENTER ONLY)
ALT: 18 U/L (ref 0–44)
AST: 13 U/L — ABNORMAL LOW (ref 15–41)
Albumin: 3.2 g/dL — ABNORMAL LOW (ref 3.5–5.0)
Alkaline Phosphatase: 83 U/L (ref 38–126)
Anion gap: 10 (ref 5–15)
BUN: 12 mg/dL (ref 8–23)
CO2: 24 mmol/L (ref 22–32)
Calcium: 8.9 mg/dL (ref 8.9–10.3)
Chloride: 106 mmol/L (ref 98–111)
Creatinine: 1.03 mg/dL (ref 0.61–1.24)
GFR, Est AFR Am: >60 mL/min (ref >60)
GFR, Estimated: >60 mL/min (ref >60)
Glucose, Bld: 118 mg/dL — ABNORMAL HIGH (ref 70–99)
Potassium: 4.3 mmol/L (ref 3.5–5.1)
Sodium: 140 mmol/L (ref 135–145)
Total Bilirubin: 0.5 mg/dL (ref 0.3–1.2)
Total Protein: 7 g/dL (ref 6.5–8.1)

## 2019-05-22 MED ORDER — SODIUM CHLORIDE 0.9 % IV SOLN
Freq: Once | INTRAVENOUS | Status: AC
  Filled 2019-05-22: qty 250

## 2019-05-22 MED ORDER — SODIUM CHLORIDE 0.9 % IV SOLN
1500.0000 mg | Freq: Once | INTRAVENOUS | Status: AC
  Administered 2019-05-22: 14:00:00 1500 mg via INTRAVENOUS
  Filled 2019-05-22: qty 30

## 2019-05-22 MED ORDER — SODIUM CHLORIDE 0.9 % IV SOLN: 360.0000 mg | Freq: Once | INTRAVENOUS | Status: DC

## 2019-05-22 NOTE — Patient Instructions (Signed)
Durvalumab injection What is this medicine? DURVALUMAB (dur VAL ue mab) is a monoclonal antibody. It is used to treat urothelial cancer and lung cancer. This medicine may be used for other purposes; ask your health care provider or pharmacist if you have questions. COMMON BRAND NAME(S): IMFINZI What should I tell my health care provider before I take this medicine? They need to know if you have any of these conditions:  diabetes  immune system problems  infection  inflammatory bowel disease  kidney disease  liver disease  lung or breathing disease  lupus  organ transplant  stomach or intestine problems  thyroid disease  an unusual or allergic reaction to durvalumab, other medicines, foods, dyes, or preservatives  pregnant or trying to get pregnant  breast-feeding How should I use this medicine? This medicine is for infusion into a vein. It is given by a health care professional in a hospital or clinic setting. A special MedGuide will be given to you before each treatment. Be sure to read this information carefully each time. Talk to your pediatrician regarding the use of this medicine in children. Special care may be needed. Overdosage: If you think you have taken too much of this medicine contact a poison control center or emergency room at once. NOTE: This medicine is only for you. Do not share this medicine with others. What if I miss a dose? It is important not to miss your dose. Call your doctor or health care professional if you are unable to keep an appointment. What may interact with this medicine? Interactions have not been studied. This list may not describe all possible interactions. Give your health care provider a list of all the medicines, herbs, non-prescription drugs, or dietary supplements you use. Also tell them if you smoke, drink alcohol, or use illegal drugs. Some items may interact with your medicine. What should I watch for while using this  medicine? This drug may make you feel generally unwell. Continue your course of treatment even though you feel ill unless your doctor tells you to stop. You may need blood work done while you are taking this medicine. Do not become pregnant while taking this medicine or for 3 months after stopping it. Women should inform their doctor if they wish to become pregnant or think they might be pregnant. There is a potential for serious side effects to an unborn child. Talk to your health care professional or pharmacist for more information. Do not breast-feed an infant while taking this medicine or for 3 months after stopping it. What side effects may I notice from receiving this medicine? Side effects that you should report to your doctor or health care professional as soon as possible:  allergic reactions like skin rash, itching or hives, swelling of the face, lips, or tongue  black, tarry stools  bloody or watery diarrhea  breathing problems  change in emotions or moods  change in sex drive  changes in vision  chest pain or chest tightness  chills  confusion  cough  facial flushing  fever  headache  signs and symptoms of high blood sugar such as dizziness; dry mouth; dry skin; fruity breath; nausea; stomach pain; increased hunger or thirst; increased urination  signs and symptoms of liver injury like dark yellow or brown urine; general ill feeling or flu-like symptoms; light-colored stools; loss of appetite; nausea; right upper belly pain; unusually weak or tired; yellowing of the eyes or skin  stomach pain  trouble passing urine or change in  the amount of urine  weight gain or weight loss Side effects that usually do not require medical attention (report these to your doctor or health care professional if they continue or are bothersome):  bone pain  constipation  loss of appetite  muscle pain  nausea  swelling of the ankles, feet, hands  tiredness This list  may not describe all possible side effects. Call your doctor for medical advice about side effects. You may report side effects to FDA at 1-800-FDA-1088. Where should I keep my medicine? This drug is given in a hospital or clinic and will not be stored at home. NOTE: This sheet is a summary. It may not cover all possible information. If you have questions about this medicine, talk to your doctor, pharmacist, or health care provider.  2020 Elsevier/Gold Standard (2016-07-20 19:25:04)

## 2019-05-22 NOTE — Progress Notes (Signed)
[facility] [phone]   [phone]  OFFICE PROGRESS NOTE  [name], MD [address] [place] [address]  DIAGNOSIS:  1) Stage IIIA (T2b, N2, M0) non-small cell lung cancer likely adenocarcinoma pending final tissue diagnosis presented with right upper lobe lung mass in addition to right hilar and mediastinal lymphadenopathy diagnosed in September 2020 2) history of prostate adenocarcinoma status post radiotherapy under the care of Dr. Tammi Klippel. 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  PRIOR THERAPY: Concurrent chemoradiation with weekly carboplatin for AUC of 2 and paclitaxel 45 mg/M2.  First dose March 05, 2019.  Status post 7 cycles.  Last dose was given April 16, 2019 with partial response.  CURRENT THERAPY: Consolidation treatment with immunotherapy with Imfinzi 1500 mg IV every 4 weeks.  First dose May 22, 2019.  INTERVAL HISTORY: Carl Harrell 73 y.o. male returns to the clinic today for follow-up visit.  The patient is feeling fine today with no concerning complaints except for fatigue and shortness of breath with exertion.  He does not exercise at regular basis as before his diagnosis.  He denied having any current chest pain, cough or hemoptysis.  He denied having any fever or chills.  He has no nausea, vomiting, diarrhea or constipation.  She has no headache or visual changes.  He is here today for evaluation before starting the first cycle of his consolidation treatment with Imfinzi.  MEDICAL HISTORY: Past Medical History:  Diagnosis Date   ALLERGIC RHINITIS 01/30/2007   Arthritis    "hands" (05/29/2014)   Cancer ([place])    lung, prostate   Constipation    Coronary artery disease    LHC (05/29/14): pLAD 50-70, pRCA 99 (L>R collats), EF 55% >> PCI: 3.5 x 28 mm Xience Alpine DES to pRCA   Cough secondary to angiotensin converting enzyme inhibitor  (ACE-I) 05/28/2013   ED (erectile dysfunction)    GERD 09/26/2007   HYPERLIPIDEMIA 09/26/2007   Hypertension    Lung cancer ([place]) dx'd 03/2018   PLANTAR FASCIITIS, BILATERAL 07/08/2009   Prostate CA ([place]) dx'd 2017 or 2018   xrt   Unspecified hearing loss 06/16/2009   wears aides both ears    ALLERGIES:  is allergic to lisinopril.  MEDICATIONS:  Current Outpatient Medications  Medication Sig Dispense Refill   cetirizine (ZYRTEC) 10 MG tablet Take 1 tablet (10 mg total) by mouth daily. 30 tablet 11   Cyanocobalamin (VITAMIN B-12 CR PO) Take by mouth daily.     ezetimibe (ZETIA) 10 MG tablet TAKE 1 TABLET BY MOUTH EVERY DAY (Patient taking differently: Take 10 mg by mouth daily. ) 90 tablet 3   fluticasone (FLONASE) 50 MCG/ACT nasal spray SHAKE LIQUID AND USE 1 SPRAY IN EACH NOSTRIL DAILY (Patient taking differently: Place 1 spray into both nostrils daily. ) 48 g 0   hydrochlorothiazide (MICROZIDE) 12.5 MG capsule Take 1 capsule (12.5 mg total) by mouth daily. 90 capsule 3   losartan (COZAAR) 50 MG tablet Take 1 tablet (50 mg total) by mouth daily. 90 tablet 3   metoprolol tartrate (LOPRESSOR) 25 MG tablet TAKE 1 TABLET BY MOUTH  TWICE DAILY (Patient taking differently: Take 25 mg by mouth 2 (two) times daily. ) 180 tablet 1   Multiple Vitamin (MULTIVITAMIN WITH MINERALS) TABS tablet Take 1 tablet by mouth daily.     omeprazole (PRILOSEC) 40 MG capsule TAKE 1 CAPSULE(40 MG) BY  MOUTH DAILY (Patient taking differently: Take 40 mg by mouth daily. ) 90 capsule 1   simvastatin (ZOCOR) 40 MG tablet TAKE 1 TABLET(40 MG) BY MOUTH DAILY AT 6 PM (Patient taking differently: Take 40 mg by mouth daily at 6 PM. ) 90 tablet 3   tamsulosin (FLOMAX) 0.4 MG CAPS capsule Take 1 capsule (0.4 mg total) by mouth daily. 30 capsule 5   No current facility-administered medications for this visit.    SURGICAL HISTORY:  Past Surgical History:  Procedure Laterality Date   CARDIAC CATHETERIZATION   05/29/2014   Procedure: CORONARY STENT INTERVENTION;  Surgeon: Sinclair Grooms, MD;  Location: [facility] CATH LAB;  Service: Cardiovascular;;  Prox RCA   COLONOSCOPY     CORONARY ANGIOPLASTY WITH STENT PLACEMENT  05/29/2014   "1"   KNEE ARTHROSCOPY Right ~ 2005   LEFT HEART CATHETERIZATION WITH CORONARY ANGIOGRAM N/A 05/29/2014   Procedure: LEFT HEART CATHETERIZATION WITH CORONARY ANGIOGRAM;  Surgeon: Sinclair Grooms, MD;  Location: [facility] CATH LAB;  Service: Cardiovascular;  Laterality: N/A;   VIDEO BRONCHOSCOPY WITH ENDOBRONCHIAL ULTRASOUND N/A 02/20/2019   Procedure: VIDEO BRONCHOSCOPY WITH ENDOBRONCHIAL ULTRASOUND;  Surgeon: Garner Nash, DO;  Location: [facility] OR;  Service: Thoracic;  Laterality: N/A;    REVIEW OF SYSTEMS:  A comprehensive review of systems was negative except for: Constitutional: positive for fatigue Respiratory: positive for dyspnea on exertion   PHYSICAL EXAMINATION: General appearance: alert, cooperative, fatigued and no distress Head: Normocephalic, without obvious abnormality, atraumatic Neck: no adenopathy, no JVD, supple, symmetrical, trachea midline and thyroid not enlarged, symmetric, no tenderness/mass/nodules Lymph nodes: Cervical, supraclavicular, and axillary nodes normal. Resp: clear to auscultation bilaterally Back: symmetric, no curvature. ROM normal. No CVA tenderness. Cardio: regular rate and rhythm, S1, S2 normal, no murmur, click, rub or gallop GI: soft, non-tender; bowel sounds normal; no masses,  no organomegaly Extremities: extremities normal, atraumatic, no cyanosis or edema  ECOG PERFORMANCE STATUS: 1 - Symptomatic but completely ambulatory  Blood pressure 116/79, pulse 92, temperature 98.7 F (37.1 C), temperature source Oral, resp. rate 18, height 5\' 7"  (1.702 m), weight 197 lb 8 oz (89.6 kg), SpO2 95 %.  LABORATORY DATA: Lab Results  Component Value Date   WBC 6.7 05/22/2019   HGB 13.0 05/22/2019   HCT 40.9 05/22/2019   MCV 88.1 05/22/2019     PLT 300 05/22/2019      Chemistry      Component Value Date/Time   NA 140 05/22/2019 1124   NA 141 08/05/2016 0826   K 4.3 05/22/2019 1124   CL 106 05/22/2019 1124   CO2 24 05/22/2019 1124   BUN 12 05/22/2019 1124   BUN 14 08/05/2016 0826   CREATININE 1.03 05/22/2019 1124      Component Value Date/Time   CALCIUM 8.9 05/22/2019 1124   ALKPHOS 83 05/22/2019 1124   AST 13 (L) 05/22/2019 1124   ALT 18 05/22/2019 1124   BILITOT 0.5 05/22/2019 1124       RADIOGRAPHIC STUDIES: CT Chest W Contrast  Result Date: 05/07/2019 CLINICAL DATA:  Lung cancer. Radiation therapy and chemotherapy completed in November 2020. History of prostate cancer. Cough following radiation therapy. Shortness of breath on exertion. Fatigue and loss of appetite. EXAM: CT CHEST WITH CONTRAST TECHNIQUE: Multidetector CT imaging of the chest was performed during intravenous contrast administration. CONTRAST:  75mL OMNIPAQUE IOHEXOL 300 MG/ML  SOLN COMPARISON:  PET 02/23/2019 and CT chest 02/07/2019. FINDINGS: Cardiovascular: Atherosclerotic calcification of the aorta, aortic valve  and coronary arteries. Heart size normal. No pericardial effusion. Mediastinum/Nodes: Heterogeneous dominant left thyroid nodule measures 3.1 cm and exerts mass effect on the trachea. Probable exophytic substernal thyroid nodule, measuring 1.0 x 1.3 cm, also stable. Thyroid with evaluated sonographically on 03/17/2019. Low right paratracheal lymph node has decreased in size, now measuring 4 mm (2/54), previously 1.3 cm. Dominant right hilar lymph node measures 2.5 cm, decreased from 2.9 cm. No left hilar or axillary adenopathy. Esophagus is grossly unremarkable. Lungs/Pleura: Spiculated right upper lobe nodule has decreased in size, now measuring 1.3 x 2.1 cm (7/38), previously 3.0 x 3.1 cm on 02/07/2019. Soft tissue extends medially to the right hilum. There is new surrounding patchy ground-glass in the right upper and right lower lobes, in  keeping with the provided history of radiation therapy. Subpleural nodular lesion in the anterior left upper lobe measures 6 mm (7/53) and is new. There may be mild basilar subpleural reticulation. 3 mm subpleural lymph node along the left major fissure, unchanged. No pleural fluid. Airway is unremarkable. Upper Abdomen: Liver may be slightly decreased in attenuation diffusely. Wedge-shaped area of low attenuation in the peripheral right hepatic lobe is unchanged and may represent an infarct. A stone is seen in the gallbladder. Adrenal glands may be minimally thickened, as before. Visualized portions of the kidneys, spleen, pancreas, stomach and bowel are unremarkable. No upper abdominal adenopathy. Musculoskeletal: Degenerative changes in the spine. No worrisome lytic or sclerotic lesions. There may be very mild compression of the T3 and T4 superior endplates, unchanged. IMPRESSION: 1. Interval response to therapy as evidenced by decrease in size of the primary right upper lobe mass and mediastinal/right hilar adenopathy. 2. Surrounding changes of radiation therapy in the right upper and right lower lobes. 3. New subpleural nodule in the anterior left upper lobe. Continued attention on follow-up exams is warranted as metastatic disease cannot be excluded. 4. Possible mild basilar subpleural fibrosis. 5. Liver may be steatotic. 6. Cholelithiasis. 7. Aortic atherosclerosis (ICD10-170.0). Coronary artery calcification. Electronically Signed   By: Lorin Picket M.D.   On: 05/07/2019 14:54    ASSESSMENT AND PLAN: This is a very pleasant 73 years old white male with recently diagnosed a stage IIIa (T2b, N2, M0) non-small cell lung cancer favoring adenocarcinoma presented with right upper lobe lung mass in addition to right hilar and mediastinal lymphadenopathy diagnosed in September 2020. The patient underwent a course of concurrent chemoradiation with weekly carboplatin and paclitaxel status post 7 cycles.  He  tolerated the treatment well except for fatigue and dry cough. He has partial response to this treatment.  The patient is here today to start the first cycle of consolidation treatment with immunotherapy with Imfinzi 1500 mg IV every 4 weeks. I recommended for the patient to proceed with the treatment today as planned. I will see him back for follow-up visit in 4 weeks for evaluation before the next cycle of his treatment. For the fatigue I advised the patient to increase his exercise slowly over the next few weeks. He was advised to call immediately if he has any concerning symptoms in the interval.  All questions were answered. The patient knows to call the clinic with any problems, questions or concerns. We can certainly see the patient much sooner if necessary.  Disclaimer: This note was dictated with voice recognition software. Similar sounding words can inadvertently be transcribed and may not be corrected upon review.

## 2019-05-23 NOTE — Progress Notes (Signed)
Radiation Oncology         [phone] ________________________________  Name: EMANNUEL VISE MRN: [id number]  Date: 05/24/2019  DOB: May 03, 1946  Post Treatment Note  CC: [name], MD  [name], MD  Diagnosis:   73 y.o. gentleman with newly diagnosed Stage IIIA (T2a, N2, M0) non-small cell lung cancer of the RUL favoring adenocarcinoma     Interval Since Last Radiation:  5 weeks  03/05/19-04/17/19: The primary tumor in the RUL and involved mediastinal adenopathy were treated to 66 Gy in 33 fractions of 2 Gy, concurrent with chemotherapy.  Narrative:  I spoke with the patient to conduct his routine scheduled 1 month follow up visit via telephone to spare the patient unnecessary potential exposure in the healthcare setting during the current COVID-19 pandemic.  The patient was notified in advance and gave permission to proceed with this visit format. He tolerated radiation treatment relatively well.  The patient experienced some esophagitis characterized as mild as well as fatigue.                              On review of systems, the patient states he is doing well overall.  He continues with a productive cough with clear to whitish sputum but denies hemoptysis, chest pain, increased shortness of breath, fever, chills or night sweats.  He has baseline shortness of breath with exertion but this is unchanged recently.  The esophagitis/dysphagia has resolved completely and he is back to eating and drinking without issue.  He has continued in routine follow with Dr. Julien Nordmann in medical oncology and completed 7 cycles of systemic chemotherapy with weekly carboplatin and paclitaxel on 04/16/19 with a partial response on follow up systemic imaging from 05/07/19 which showed a decrease in size of the primary right upper lobe mass and mediastinal/right hilar adenopathy but a new subpleural nodule in the anterior left upper lobe.  He was started on consolidation treatment with immunotherapy  with Imfinzi q 4 weeks, first dose on 05/22/19.  ALLERGIES:  is allergic to lisinopril.  Meds: Current Outpatient Medications  Medication Sig Dispense Refill  . cetirizine (ZYRTEC) 10 MG tablet Take 1 tablet (10 mg total) by mouth daily. 30 tablet 11  . Cyanocobalamin (VITAMIN B-12 CR PO) Take by mouth daily.    Marland Kitchen ezetimibe (ZETIA) 10 MG tablet TAKE 1 TABLET BY MOUTH EVERY DAY (Patient taking differently: Take 10 mg by mouth daily. ) 90 tablet 3  . fluticasone (FLONASE) 50 MCG/ACT nasal spray SHAKE LIQUID AND USE 1 SPRAY IN EACH NOSTRIL DAILY (Patient taking differently: Place 1 spray into both nostrils daily. ) 48 g 0  . hydrochlorothiazide (MICROZIDE) 12.5 MG capsule Take 1 capsule (12.5 mg total) by mouth daily. 90 capsule 3  . losartan (COZAAR) 50 MG tablet Take 1 tablet (50 mg total) by mouth daily. 90 tablet 3  . metoprolol tartrate (LOPRESSOR) 25 MG tablet TAKE 1 TABLET BY MOUTH  TWICE DAILY (Patient taking differently: Take 25 mg by mouth 2 (two) times daily. ) 180 tablet 1  . Multiple Vitamin (MULTIVITAMIN WITH MINERALS) TABS tablet Take 1 tablet by mouth daily.    Marland Kitchen omeprazole (PRILOSEC) 40 MG capsule TAKE 1 CAPSULE(40 MG) BY MOUTH DAILY (Patient taking differently: Take 40 mg by mouth daily. ) 90 capsule 1  . simvastatin (ZOCOR) 40 MG tablet TAKE 1 TABLET(40 MG) BY MOUTH DAILY AT 6 PM (Patient taking differently: Take 40 mg by  mouth daily at 6 PM. ) 90 tablet 3  . tamsulosin (FLOMAX) 0.4 MG CAPS capsule Take 1 capsule (0.4 mg total) by mouth daily. 30 capsule 5   No current facility-administered medications for this visit.    Physical Findings:  vitals were not taken for this visit.   /Unable to assess due to telephone follow-up visit format. Lab Findings: Lab Results  Component Value Date   WBC 6.7 05/22/2019   HGB 13.0 05/22/2019   HCT 40.9 05/22/2019   MCV 88.1 05/22/2019   PLT 300 05/22/2019     Radiographic Findings: CT Chest W Contrast  Result Date:  05/07/2019 CLINICAL DATA:  Lung cancer. Radiation therapy and chemotherapy completed in November 2020. History of prostate cancer. Cough following radiation therapy. Shortness of breath on exertion. Fatigue and loss of appetite. EXAM: CT CHEST WITH CONTRAST TECHNIQUE: Multidetector CT imaging of the chest was performed during intravenous contrast administration. CONTRAST:  71mL OMNIPAQUE IOHEXOL 300 MG/ML  SOLN COMPARISON:  PET 02/23/2019 and CT chest 02/07/2019. FINDINGS: Cardiovascular: Atherosclerotic calcification of the aorta, aortic valve and coronary arteries. Heart size normal. No pericardial effusion. Mediastinum/Nodes: Heterogeneous dominant left thyroid nodule measures 3.1 cm and exerts mass effect on the trachea. Probable exophytic substernal thyroid nodule, measuring 1.0 x 1.3 cm, also stable. Thyroid with evaluated sonographically on 03/17/2019. Low right paratracheal lymph node has decreased in size, now measuring 4 mm (2/54), previously 1.3 cm. Dominant right hilar lymph node measures 2.5 cm, decreased from 2.9 cm. No left hilar or axillary adenopathy. Esophagus is grossly unremarkable. Lungs/Pleura: Spiculated right upper lobe nodule has decreased in size, now measuring 1.3 x 2.1 cm (7/38), previously 3.0 x 3.1 cm on 02/07/2019. Soft tissue extends medially to the right hilum. There is new surrounding patchy ground-glass in the right upper and right lower lobes, in keeping with the provided history of radiation therapy. Subpleural nodular lesion in the anterior left upper lobe measures 6 mm (7/53) and is new. There may be mild basilar subpleural reticulation. 3 mm subpleural lymph node along the left major fissure, unchanged. No pleural fluid. Airway is unremarkable. Upper Abdomen: Liver may be slightly decreased in attenuation diffusely. Wedge-shaped area of low attenuation in the peripheral right hepatic lobe is unchanged and may represent an infarct. A stone is seen in the gallbladder. Adrenal  glands may be minimally thickened, as before. Visualized portions of the kidneys, spleen, pancreas, stomach and bowel are unremarkable. No upper abdominal adenopathy. Musculoskeletal: Degenerative changes in the spine. No worrisome lytic or sclerotic lesions. There may be very mild compression of the T3 and T4 superior endplates, unchanged. IMPRESSION: 1. Interval response to therapy as evidenced by decrease in size of the primary right upper lobe mass and mediastinal/right hilar adenopathy. 2. Surrounding changes of radiation therapy in the right upper and right lower lobes. 3. New subpleural nodule in the anterior left upper lobe. Continued attention on follow-up exams is warranted as metastatic disease cannot be excluded. 4. Possible mild basilar subpleural fibrosis. 5. Liver may be steatotic. 6. Cholelithiasis. 7. Aortic atherosclerosis (ICD10-170.0). Coronary artery calcification. Electronically Signed   By: Lorin Picket M.D.   On: 05/07/2019 14:54    Impression/Plan: 1. 73 y.o. gentleman with newly diagnosed Stage IIIA (T2a, N2, M0) non-small cell lung cancer of the RUL favoring adenocarcinoma. He appears to have recovered well from the effects of his recent chemoradiation and is currently without complaints. He recently started consolidation therapy with Imfinzi, first dose on 05/22/19 and plans to continue  in routine follow with Dr. Julien Nordmann in medical oncology for management of his systemic disease. We discussed that while we are happy to continue to participate in his care if clinically indicated, at this point, we will plan to see him back on an as needed basis.  He is comfortable and in agreement with the stated plan and knows to call at any time with any questions or concerns related to his previous radiation.     Nicholos Johns, PA-C

## 2019-05-24 ENCOUNTER — Other Ambulatory Visit: Payer: Self-pay

## 2019-05-24 ENCOUNTER — Ambulatory Visit
Admission: RE | Admit: 2019-05-24 | Discharge: 2019-05-24 | Disposition: A | Discharge status: Home / Self Care | Payer: Medicare Other | Source: Ambulatory Visit | Attending: Urology | Admitting: Urology

## 2019-05-24 DIAGNOSIS — C3491 Malignant neoplasm of unspecified part of right bronchus or lung: Secondary | ICD-10-CM

## 2019-06-05 ENCOUNTER — Other Ambulatory Visit: Payer: Self-pay | Admitting: Family Medicine

## 2019-06-05 MED ORDER — METOPROLOL TARTRATE 25 MG PO TABS: 25.0000 mg | ORAL_TABLET | Freq: Two times a day (BID) | ORAL | 0 refills | Status: DC

## 2019-06-05 NOTE — Telephone Encounter (Signed)
Medication Refill - Medication: metoprolol tartrate (LOPRESSOR) 25 MG tablet  Has the patient contacted their pharmacy? No - first time PCP will refill (Agent: If no, request that the patient contact the pharmacy for the refill.) (Agent: If yes, when and what did the pharmacy advise?)  Preferred Pharmacy (with phone number or street name):  [facility], [place] - [address] [place] Phone:  [phone]  Fax:  [phone]     Agent: Please be advised that RX refills may take up to 3 business days. We ask that you follow-up with your pharmacy.

## 2019-06-17 ENCOUNTER — Encounter (HOSPITAL_COMMUNITY): Payer: Self-pay | Admitting: Surgery

## 2019-06-17 DIAGNOSIS — D440 Neoplasm of uncertain behavior of thyroid gland: Secondary | ICD-10-CM | POA: Diagnosis present

## 2019-06-17 DIAGNOSIS — E041 Nontoxic single thyroid nodule: Secondary | ICD-10-CM | POA: Diagnosis present

## 2019-06-17 NOTE — H&P (Signed)
General Surgery [facility]  Alice Reichert DOB: Nov 29, 1945 Married / Language: English / Race: White Male   History of Present Illness   The patient is a 74 year old male who presents with a thyroid nodule.  CHIEF COMPLAINT: thyroid neoplasm of uncertain behavior  Patient is referred by Dr. Bernardo Heater for surgical evaluation and management of a left thyroid nodule with cytologic atypia. Patient was undergoing treatment for lung carcinoma. He was noted on a PET scan to have activity in the left thyroid lobe. Subsequent ultrasound in October 2020 demonstrated a solitary 3.0 cm nodule in the inferior left thyroid lobe. Fine-needle aspiration showed a follicular neoplasm, Bethesda category IV. Specimen was subsequently sent for [facility] testing which yielded a suspicious result, rendering a risk of malignancy of 50%. Patient is now referred to surgery for further evaluation and management. Patient has no prior history of thyroid disease. He has never been on thyroid medication. He has had no prior head or neck surgery. He recently completed combination chemotherapy and radiation therapy for treatment of his pulmonary malignancy. There is no family history of endocrine disease or other endocrine neoplasm. Patient denies tremors or palpitations. He presents today for consideration for thyroid surgery for definitive diagnosis and management.   Allergies  Lisinopril *ANTIHYPERTENSIVES*  Allergies Reconciled   Medication History Tamsulosin HCl (0.4MG  Capsule, Oral) Active. Simvastatin (40MG  Tablet, Oral) Active. Omeprazole (40MG  Capsule DR, Oral) Active. Prochlorperazine Maleate (10MG  Tablet, Oral) Active. Metoprolol Tartrate (25MG  Tablet, Oral) Active. Losartan Potassium-HCTZ (50-12.5MG  Tablet, Oral) Active. Cetirizine HCl (10MG  Tablet, Oral) Active. Benzonatate (100MG  Capsule, Oral) Active. Ezetimibe (10MG  Tablet, Oral) Active. Medications  Reconciled  Social History Alcohol use  Moderate alcohol use. Caffeine use  Carbonated beverages, Tea. Illicit drug use  Remotely quit drug use. Tobacco use  Former smoker.  Family History  Arthritis  Sister. Breast Cancer  Mother. Diabetes Mellitus  Father, Mother.  Other Problems Arthritis  Back Pain  Enlarged Prostate  Hemorrhoids  Lung Cancer  Prostate Cancer   Review of Systems  General Present- Appetite Loss and Fatigue. Not Present- Chills, Fever, Night Sweats, Weight Gain and Weight Loss. Skin Not Present- Change in Wart/Mole, Dryness, Hives, Jaundice, New Lesions, Non-Healing Wounds, Rash and Ulcer. HEENT Not Present- Earache, Hearing Loss, Hoarseness, Nose Bleed, Oral Ulcers, Ringing in the Ears, Seasonal Allergies, Sinus Pain, Sore Throat, Visual Disturbances, Wears glasses/contact lenses and Yellow Eyes. Respiratory Present- Chronic Cough. Not Present- Bloody sputum, Difficulty Breathing, Snoring and Wheezing. Cardiovascular Not Present- Chest Pain, Difficulty Breathing Lying Down, Leg Cramps, Palpitations, Rapid Heart Rate, Shortness of Breath and Swelling of Extremities. Gastrointestinal Present- Change in Bowel Habits, Gets full quickly at meals and Hemorrhoids. Not Present- Abdominal Pain, Bloating, Bloody Stool, Chronic diarrhea, Constipation, Difficulty Swallowing, Excessive gas, Indigestion, Nausea, Rectal Pain and Vomiting. Male Genitourinary Present- Frequency. Not Present- Blood in Urine, Change in Urinary Stream, Impotence, Nocturia, Painful Urination, Urgency and Urine Leakage.  Vitals  Weight: 199.4 lb Height: 67in Body Surface Area: 2.02 m Body Mass Index: 31.23 kg/m  Temp.: 98.42F(Thermal Scan)  Pulse: 112 (Regular)  BP: 122/80 (Sitting, Left Arm, Standard)  Physical Exam  GENERAL APPEARANCE Development: normal Nutritional status: normal Gross deformities: none  SKIN Rash, lesions, ulcers: none Induration, erythema:  none Nodules: none palpable  EYES Conjunctiva and lids: normal Pupils: equal and reactive Iris: normal bilaterally  EARS, NOSE, MOUTH, THROAT External ears: no lesion or deformity External nose: no lesion or deformity Hearing: grossly normal Patient is wearing  a mask.  NECK Symmetric: yes Trachea: midline Thyroid: There is a palpable nodule in the inferior left thyroid lobe with swallowing maneuver. This measures approximately 2-1/2-3 cm in size, is smooth, firm, and mobile. It is nontender. There does not appear to be any associated lymphadenopathy. Right thyroid lobe was without palpable abnormality.  CHEST Respiratory effort: normal Retraction or accessory muscle use: no Breath sounds: normal bilaterally Rales, rhonchi, wheeze: none  CARDIOVASCULAR Auscultation: regular rhythm, normal rate Murmurs: none Pulses: carotid and radial pulse 2+ palpable Lower extremity edema: none Lower extremity varicosities: none  MUSCULOSKELETAL Station and gait: normal Digits and nails: no clubbing or cyanosis Muscle strength: grossly normal all extremities Range of motion: grossly normal all extremities Deformity: none  LYMPHATIC Cervical: none palpable Supraclavicular: none palpable  PSYCHIATRIC Oriented to person, place, and time: yes Mood and affect: normal for situation Judgment and insight: appropriate for situation    Assessment & Plan   NEOPLASM OF UNCERTAIN BEHAVIOR OF THYROID GLAND (D44.0) LEFT THYROID NODULE (E04.1)  Pt Education - Pamphlet Given - The Thyroid Book: discussed with patient and provided information.  Patient is referred by his medical oncologist for evaluation of a newly diagnosed left thyroid nodule with cytologic atypia. Patient is provided with written literature on thyroid surgery to review at home.  Patient has a 3.0 cm nodule in the inferior left thyroid lobe. Molecular genetic testing reveals this to be suspicious with a risk of malignancy  of 50%. We discussed the options for surgical management being either left thyroid lobectomy or total thyroidectomy. We discussed the risk and benefits of each procedure. Patient wishes to proceed with total thyroidectomy. We discussed the risk of recurrent laryngeal nerve injury and injury to parathyroid glands. We discussed the location of the surgical incision. We discussed the hospital stay to be anticipated. We discussed the postoperative recovery and return to activities. We discussed the potential need for radioactive iodine treatment. We discussed the need for lifelong thyroid hormone replacement therapy. The patient understands and wishes to proceed with surgery in the near future.  The risks and benefits of the procedure have been discussed at length with the patient. The patient understands the proposed procedure, potential alternative treatments, and the course of recovery to be expected. All of the patient's questions have been answered at this time. The patient wishes to proceed with surgery.   Armandina Gemma, MD [facility] Office: [phone]

## 2019-06-19 NOTE — Progress Notes (Signed)
[place] OFFICE PROGRESS NOTE  Carl Ruddy, MD [address] [place] [address]  DIAGNOSIS: 1) Stage IIIA (T2b, N2, M0) non-small cell lung cancer likely adenocarcinoma pending final tissue diagnosis presented with right upper lobe lung mass in addition to right hilar and mediastinal lymphadenopathy diagnosed in September 2020 2) history of prostate adenocarcinoma status post radiotherapy under the care of Dr. Tammi Klippel. 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  Molecular Biomarkers:  [id number] Everolimus, Temsirolimus Yes 4.9% [id number] None     Yes 4.7% [id number]     Yes 0.2% [id number] None     No 5.2% [id number] None     No 5.0% [id number] None (VUS)    No (VUS) 4.4% [id number] (VUS)    No (VUS) 3.5% MSI-High        NOT DETECTED  PRIOR THERAPY: Concurrent chemoradiation with weekly carboplatin for AUC of 2 and paclitaxel 45 mg/M2.  First dose March 05, 2019.  Status post 7 cycles.  Last dose was given April 16, 2019 with partial response  CURRENT THERAPY: Consolidation treatment with immunotherapy with Imfinzi 1500 mg IV every 4 weeks.  First dose May 22, 2019. Status post 1 cycle.   INTERVAL HISTORY: Carl EDDLEMAN74y.o. male returns to the clinic for a follow up visit. The patient is fairly feeling well today without any concerning complaints. He is scheduled to have a total thyroidectomy on 2/1 for folicular neoplasm of the thyroid under the care of Dr. GHarlow Asa   Regarding his treatment with Imfinzi, the patient continues to tolerate treatment well without any adverse effects except he had a small non-pruitic rash on his right shoulder which has resolved at this time. Denies any fever, chills, night sweats, or weight loss. Denies any chest pain, or hemoptysis. He is reporting his baseline shortness of breath with exertion. He also reports his  baseline cough which produces sputum which was been occurring for "a long time". He uses a cough drop if needed and has prescription for cough medication. Denies any nausea, vomiting, or constipation. He state has been experiencing intermittent diarrhea for several months even prior to immunotherapy. He has not tried any anti-diarrheal medications. He states he modified his eating habits and it improved. Denies any headache or visual changes. The patient is here today for evaluation prior to starting cycle # 2  MEDICAL HISTORY: Past Medical History:  Diagnosis Date  . ALLERGIC RHINITIS 01/30/2007  . Arthritis    "hands" (05/29/2014)  . Cancer ([place]    lung, prostate  . Constipation   . Coronary artery disease    LHC (05/29/14): pLAD 50-70, pRCA 99 (L>R collats), EF 55% >> PCI: 3.5 x 28 mm Xience Alpine DES to [employer] . Cough secondary to angiotensin converting enzyme inhibitor (ACE-I) 05/28/2013  . ED (erectile dysfunction)   . GERD 09/26/2007  . HYPERLIPIDEMIA 09/26/2007  . Hypertension   . Lung cancer ([place] dx'd 03/2018  . PLANTAR FASCIITIS, BILATERAL 07/08/2009  . Prostate CA ([place] dx'd 2017 or 2018   xrt  . Unspecified hearing loss 06/16/2009   wears aides both ears    ALLERGIES:  is allergic to lisinopril.  MEDICATIONS:  Current Outpatient Medications  Medication Sig Dispense Refill  . benzonatate (TESSALON) 100 MG capsule Take 100 mg by mouth daily as needed for cough.     . cetirizine (ZYRTEC) 10 MG tablet Take 1 tablet (10 mg total) by  mouth daily. 30 tablet 11  . Cyanocobalamin (VITAMIN B-12 CR PO) Take 50 mcg by mouth daily.     Marland Kitchen ezetimibe (ZETIA) 10 MG tablet TAKE 1 TABLET BY MOUTH EVERY DAY (Patient taking differently: Take 10 mg by mouth at bedtime. ) 90 tablet 3  . fluticasone (FLONASE) 50 MCG/ACT nasal spray SHAKE LIQUID AND USE 1 SPRAY IN EACH NOSTRIL DAILY (Patient taking differently: Place 1 spray into both nostrils daily. ) 48 g 0  . guaifenesin (TUSSIN) 100 MG/5ML syrup  Take 400 mg by mouth 2 (two) times daily.    . metoprolol tartrate (LOPRESSOR) 25 MG tablet Take 1 tablet (25 mg total) by mouth 2 (two) times daily. 180 tablet 0  . Multiple Vitamin (MULTIVITAMIN WITH MINERALS) TABS tablet Take 1 tablet by mouth daily.    Marland Kitchen omeprazole (PRILOSEC) 40 MG capsule TAKE 1 CAPSULE(40 MG) BY MOUTH DAILY (Patient taking differently: Take 40 mg by mouth daily. ) 90 capsule 1  . simvastatin (ZOCOR) 40 MG tablet TAKE 1 TABLET(40 MG) BY MOUTH DAILY AT 6 PM (Patient taking differently: Take 40 mg by mouth daily at 6 PM. ) 90 tablet 3  . tamsulosin (FLOMAX) 0.4 MG CAPS capsule Take 1 capsule (0.4 mg total) by mouth daily. 30 capsule 5  . diphenhydrAMINE (ALLERGY) 25 MG tablet Take 25 mg by mouth daily.    . hydrochlorothiazide (MICROZIDE) 12.5 MG capsule Take 1 capsule (12.5 mg total) by mouth daily. (Patient not taking: Reported on 06/12/2019) 90 capsule 3  . losartan (COZAAR) 50 MG tablet Take 1 tablet (50 mg total) by mouth daily. (Patient not taking: Reported on 06/12/2019) 90 tablet 3   No current facility-administered medications for this visit.    SURGICAL HISTORY:  Past Surgical History:  Procedure Laterality Date  . CARDIAC CATHETERIZATION  05/29/2014   Procedure: CORONARY STENT INTERVENTION;  Surgeon: Sinclair Grooms, MD;  Location: [facility] CATH LAB;  Service: Cardiovascular;;  Prox RCA  . COLONOSCOPY    . CORONARY ANGIOPLASTY WITH STENT PLACEMENT  05/29/2014   "1"  . KNEE ARTHROSCOPY Right ~ 2005  . LEFT HEART CATHETERIZATION WITH CORONARY ANGIOGRAM N/A 05/29/2014   Procedure: LEFT HEART CATHETERIZATION WITH CORONARY ANGIOGRAM;  Surgeon: Sinclair Grooms, MD;  Location: [facility] CATH LAB;  Service: Cardiovascular;  Laterality: N/A;  . VIDEO BRONCHOSCOPY WITH ENDOBRONCHIAL ULTRASOUND N/A 02/20/2019   Procedure: VIDEO BRONCHOSCOPY WITH ENDOBRONCHIAL ULTRASOUND;  Surgeon: Garner Nash, DO;  Location: [facility] OR;  Service: Thoracic;  Laterality: N/A;    REVIEW OF SYSTEMS:    Review of Systems  Constitutional: Positive for decreased appetite. Negative for chills, fatigue, fever and unexpected weight change.  HENT: Negative for mouth sores, nosebleeds, sore throat and trouble swallowing.   Eyes: Negative for eye problems and icterus.  Respiratory: Positive for baseline shortness of breath with exertion and occasional cough. Negative for cough, hemoptysis, and wheezing.   Cardiovascular: Negative for chest pain and leg swelling.  Gastrointestinal: Positive for occasional mild diarrhea. Negative for abdominal pain, constipation, nausea and vomiting.  Genitourinary: Negative for bladder incontinence, difficulty urinating, dysuria, frequency and hematuria.   Musculoskeletal: Negative for back pain, gait problem, neck pain and neck stiffness.  Skin: Positive for right shoulder rash (resolved. Negative for itching.   Neurological: Negative for dizziness, extremity weakness, gait problem, headaches, light-headedness and seizures.  Hematological: Negative for adenopathy. Does not bruise/bleed easily.  Psychiatric/Behavioral: Negative for confusion, depression and sleep disturbance. The patient is not nervous/anxious.  PHYSICAL EXAMINATION:  Blood pressure 125/84, pulse 73, temperature 98.4 F (36.9 C), temperature source Temporal, resp. rate 18, height '5\' 7"'$  (1.702 m), weight 193 lb 8 oz (87.8 kg), SpO2 98 %.  ECOG PERFORMANCE STATUS: 1 - Symptomatic but completely ambulatory  Physical Exam  Constitutional: Oriented to person, place, and time and well-developed, well-nourished, and in no distress.  HENT:  Head: Normocephalic and atraumatic.  Mouth/Throat: Oropharynx is clear and moist. No oropharyngeal exudate.  Eyes: Conjunctivae are normal. Right eye exhibits no discharge. Left eye exhibits no discharge. No scleral icterus.  Neck: Normal range of motion. Neck supple.  Cardiovascular: Normal rate, regular rhythm, normal heart sounds and intact distal pulses.    Pulmonary/Chest: Effort normal and breath sounds normal. No respiratory distress. No wheezes. No rales.  Abdominal: Soft. Bowel sounds are normal. Exhibits no distension and no mass. There is no tenderness.  Musculoskeletal: Normal range of motion. Exhibits no edema.  Lymphadenopathy:    No cervical adenopathy.  Neurological: Alert and oriented to person, place, and time. Exhibits normal muscle tone. Gait normal. Coordination normal.  Skin: Skin is warm and dry. No rash noted. Not diaphoretic. No erythema. No pallor.  Psychiatric: Mood, memory and judgment normal.  Vitals reviewed.  LABORATORY DATA: Lab Results  Component Value Date   WBC 5.7 06/20/2019   HGB 13.3 06/20/2019   HCT 41.8 06/20/2019   MCV 89.9 06/20/2019   PLT 222 06/20/2019      Chemistry      Component Value Date/Time   NA 140 05/22/2019 1124   NA 141 08/05/2016 0826   K 4.3 05/22/2019 1124   CL 106 05/22/2019 1124   CO2 24 05/22/2019 1124   BUN 12 05/22/2019 1124   BUN 14 08/05/2016 0826   CREATININE 1.03 05/22/2019 1124      Component Value Date/Time   CALCIUM 8.9 05/22/2019 1124   ALKPHOS 83 05/22/2019 1124   AST 13 (L) 05/22/2019 1124   ALT 18 05/22/2019 1124   BILITOT 0.5 05/22/2019 1124       RADIOGRAPHIC STUDIES:  No results found.   ASSESSMENT/PLAN:  This is a very pleasant 74 year old Caucasian male diagnosed with stage IIIa (T2b,N2, M0) non-small cell lung cancer, pathology favoring adenocarcinoma.  He presented with a right upper lobe lung mass in addition to right hilar and mediastinal lymphadenopathy.  He was diagnosed in September 2020.  He is negative for any actionable mutations.   He also was diagnosed with follicular neoplasm of the thyroid. He is scheduled to undergo total thyroidectomy on 06/25/19 under the care of Dr. Harlow Asa.   The patient completed 7 cycles of weekly concurrent chemoradiation with carboplatin for an AUC of 2 and paclitaxel 45 mg/m.   He is currently  undergoing consolidation immunotherapy with Imfinzi IV every 4 weeks. He is status post 1 cycle. He tolerated it well without any adverse side effects except for a mild skin rash.   Labs were reviewed. Recommend that he proceed with cycle #2 today as scheduled.   We will see him back for a follow up visit in 4 weeks for evaluation before starting cycle #3.   If he develops another mild skin rash, discussed that if it becomes pruritic, he may use topical hydrocortisone cream.   For his diarrhea, discussed that he may use imodium, although he states this is has improved with dietary modifications.   He will have his thyroidectomy next week as planned.   For his cough, he states he  needs a refill of his medication but cannot remember the name of his medication. Instructed the patient to go home and look at the pill bottle for the name of the medication. He will call us back to confirm the medication he needs a refill of. It appears he had a refill of tessalon last week on 06/12/2019 so he was instructed to call his pharmacy to see if he has any medications there to be picked up.   The patient was advised to call immediately if he has any concerning symptoms in the interval. The patient voices understanding of current disease status and treatment options and is in agreement with the current care plan. All questions were answered. The patient knows to call the clinic with any problems, questions or concerns. We can certainly see the patient much sooner if necessary     Orders Placed This Encounter  Procedures  . CMP ([place] only)    Standing Status:   Standing    Number of Occurrences:   12    Standing Expiration Date:   06/19/2020  . TSH    Standing Status:   Standing    Number of Occurrences:   12    Standing Expiration Date:   06/19/2020     Dejanae Helser L Antjuan Rothe, PA-C 06/20/19

## 2019-06-20 ENCOUNTER — Encounter: Payer: Self-pay | Admitting: Physician Assistant

## 2019-06-20 ENCOUNTER — Inpatient Hospital Stay: Payer: Medicare Other

## 2019-06-20 ENCOUNTER — Inpatient Hospital Stay: Payer: Medicare Other | Attending: Internal Medicine | Admitting: Physician Assistant

## 2019-06-20 ENCOUNTER — Other Ambulatory Visit: Payer: Self-pay

## 2019-06-20 VITALS — BP 125/84 | HR 73 | Temp 98.4°F | Resp 18 | Ht 67.0 in | Wt 193.5 lb

## 2019-06-20 DIAGNOSIS — E785 Hyperlipidemia, unspecified: Secondary | ICD-10-CM | POA: Diagnosis not present

## 2019-06-20 DIAGNOSIS — C3411 Malignant neoplasm of upper lobe, right bronchus or lung: Secondary | ICD-10-CM | POA: Diagnosis not present

## 2019-06-20 DIAGNOSIS — R197 Diarrhea, unspecified: Secondary | ICD-10-CM | POA: Diagnosis not present

## 2019-06-20 DIAGNOSIS — I10 Essential (primary) hypertension: Secondary | ICD-10-CM | POA: Diagnosis not present

## 2019-06-20 DIAGNOSIS — I2510 Atherosclerotic heart disease of native coronary artery without angina pectoris: Secondary | ICD-10-CM | POA: Diagnosis not present

## 2019-06-20 DIAGNOSIS — Z5112 Encounter for antineoplastic immunotherapy: Secondary | ICD-10-CM | POA: Diagnosis not present

## 2019-06-20 DIAGNOSIS — C3491 Malignant neoplasm of unspecified part of right bronchus or lung: Secondary | ICD-10-CM

## 2019-06-20 DIAGNOSIS — Z79899 Other long term (current) drug therapy: Secondary | ICD-10-CM | POA: Diagnosis not present

## 2019-06-20 DIAGNOSIS — Z8546 Personal history of malignant neoplasm of prostate: Secondary | ICD-10-CM | POA: Diagnosis not present

## 2019-06-20 DIAGNOSIS — R05 Cough: Secondary | ICD-10-CM | POA: Diagnosis not present

## 2019-06-20 DIAGNOSIS — C73 Malignant neoplasm of thyroid gland: Secondary | ICD-10-CM | POA: Insufficient documentation

## 2019-06-20 LAB — CMP (CANCER CENTER ONLY)
ALT: 20 U/L (ref 0–44)
AST: 17 U/L (ref 15–41)
Albumin: 3.6 g/dL (ref 3.5–5.0)
Alkaline Phosphatase: 76 U/L (ref 38–126)
Anion gap: 9 (ref 5–15)
BUN: 11 mg/dL (ref 8–23)
CO2: 25 mmol/L (ref 22–32)
Calcium: 8.6 mg/dL — ABNORMAL LOW (ref 8.9–10.3)
Chloride: 108 mmol/L (ref 98–111)
Creatinine: 1 mg/dL (ref 0.61–1.24)
GFR, Est AFR Am: >60 mL/min (ref >60)
GFR, Estimated: >60 mL/min (ref >60)
Glucose, Bld: 130 mg/dL — ABNORMAL HIGH (ref 70–99)
Potassium: 4.2 mmol/L (ref 3.5–5.1)
Sodium: 142 mmol/L (ref 135–145)
Total Bilirubin: 0.6 mg/dL (ref 0.3–1.2)
Total Protein: 6.5 g/dL (ref 6.5–8.1)

## 2019-06-20 LAB — CBC WITH DIFFERENTIAL (CANCER CENTER ONLY)
Abs Immature Granulocytes: 0.03 10*3/uL (ref 0.00–0.07)
Basophils Absolute: 0 10*3/uL (ref 0.0–0.1)
Basophils Relative: 0 %
Eosinophils Absolute: 0.4 10*3/uL (ref 0.0–0.5)
Eosinophils Relative: 7 %
HCT: 41.8 % (ref 39.0–52.0)
Hemoglobin: 13.3 g/dL (ref 13.0–17.0)
Immature Granulocytes: 1 %
Lymphocytes Relative: 17 %
Lymphs Abs: 1 10*3/uL (ref 0.7–4.0)
MCH: 28.6 pg (ref 26.0–34.0)
MCHC: 31.8 g/dL (ref 30.0–36.0)
MCV: 89.9 fL (ref 80.0–100.0)
Monocytes Absolute: 0.7 10*3/uL (ref 0.1–1.0)
Monocytes Relative: 12 %
Neutro Abs: 3.7 10*3/uL (ref 1.7–7.7)
Neutrophils Relative %: 63 %
Platelet Count: 222 10*3/uL (ref 150–400)
RBC: 4.65 MIL/uL (ref 4.22–5.81)
RDW: 14.6 % (ref 11.5–15.5)
WBC Count: 5.7 10*3/uL (ref 4.0–10.5)
nRBC: 0 % (ref 0.0–0.2)

## 2019-06-20 LAB — TSH: TSH: 1.334 u[IU]/mL (ref 0.320–4.118)

## 2019-06-20 MED ORDER — SODIUM CHLORIDE 0.9 % IV SOLN
Freq: Once | INTRAVENOUS | Status: AC
  Filled 2019-06-20: qty 250

## 2019-06-20 MED ORDER — SODIUM CHLORIDE 0.9 % IV SOLN
1500.0000 mg | Freq: Once | INTRAVENOUS | Status: AC
  Administered 2019-06-20: 1500 mg via INTRAVENOUS
  Filled 2019-06-20: qty 30

## 2019-06-20 NOTE — Patient Instructions (Signed)
DUE TO COVID-19 ONLY ONE VISITOR IS ALLOWED TO COME WITH YOU AND STAY IN THE WAITING ROOM ONLY DURING PRE OP AND PROCEDURE DAY OF SURGERY. THE 1 VISITOR MAY VISIT WITH YOU AFTER SURGERY IN YOUR PRIVATE ROOM DURING VISITING HOURS ONLY!  YOU NEED TO HAVE A COVID 19 TEST ON_1/28______ @_11 :30____, THIS TEST MUST BE DONE BEFORE SURGERY, COME  [address] [place] , [id number].  ([place]) ONCE YOUR COVID TEST IS COMPLETED, PLEASE BEGIN THE QUARANTINE INSTRUCTIONS AS OUTLINED IN YOUR HANDOUT.                ROYER CRISTOBAL    Your procedure is scheduled on: 06/25/19   Report to [facility] Main  Entrance   Report to Short Stay at 5:30 AM     Call this number if you have problems the morning of surgery [phone]    Remember: Do not eat food or drink liquids :After Midnight  . BRUSH YOUR TEETH MORNING OF SURGERY AND RINSE YOUR MOUTH OUT, NO CHEWING GUM CANDY OR MINTS.     Take these medicines the morning of surgery with A SIP OF WATER: Zyrtec, metoprolol(Lopressor),tamsulosin(Flomax),prilosec, use flonase if needed                                 You may not have any metal on your body including               piercings  Do not wear jewelry,  lotions, powders or deodorant                      Men may shave face and neck.   Do not bring valuables to the hospital. [place].  Contacts, dentures or bridgework may not be worn into surgery.      Patients discharged the day of surgery will not be allowed to drive home . IF YOU ARE HAVING SURGERY AND GOING HOME THE SAME DAY, YOU MUST HAVE AN ADULT TO DRIVE YOU HOME AND BE WITH YOU FOR 24 HOURS. YOU MAY GO HOME BY TAXI OR UBER OR ORTHERWISE, BUT AN ADULT MUST ACCOMPANY YOU HOME AND STAY WITH YOU FOR 24 HOURS.  Name and phone number of your driver:  Special Instructions: N/A              Please read over the following fact sheets you were  given: _____________________________________________________________________             [facility] - Preparing for Surgery  Before surgery, you can play an important role.  Because skin is not sterile, your skin needs to be as free of germs as possible .  You can reduce the number of germs on your skin by washing with CHG (chlorahexidine gluconate) soap before surgery.   CHG is an antiseptic cleaner which kills germs and bonds with the skin to continue killing germs even after washing. Please DO NOT use if you have an allergy to CHG or antibacterial soaps.   If your skin becomes reddened/irritated stop using the CHG and inform your nurse when you arrive at Short Stay. .  You may shave your face/neck.  Please follow these instructions carefully:  1.  Shower with CHG Soap the night before surgery and the  morning  of Surgery.  2.  If you choose to wash your hair, wash your hair first as usual with your  normal  shampoo.  3.  After you shampoo, rinse your hair and body thoroughly to remove the  shampoo.                                        4.  Use CHG as you would any other liquid soap.  You can apply chg directly  to the skin and wash                       Gently with a scrungie or clean washcloth.  5.  Apply the CHG Soap to your body ONLY FROM THE NECK DOWN.   Do not use on face/ open                           Wound or open sores. Avoid contact with eyes, ears mouth and genitals (private parts).                       Wash face,  Genitals (private parts) with your normal soap.             6.  Wash thoroughly, paying special attention to the area where your surgery  will be performed.  7.  Thoroughly rinse your body with warm water from the neck down.  8.  DO NOT shower/wash with your normal soap after using and rinsing off  the CHG Soap.             9.  Pat yourself dry with a clean towel.            10.  Wear clean pajamas.            11.  Place clean sheets on your bed the night of  your first shower and do not  sleep with pets. Day of Surgery : Do not apply any lotions/deodorants the morning of surgery.  Please wear clean clothes to the hospital/surgery center.  FAILURE TO FOLLOW THESE INSTRUCTIONS MAY RESULT IN THE CANCELLATION OF YOUR SURGERY PATIENT SIGNATURE_________________________________  NURSE SIGNATURE__________________________________  ________________________________________________________________________

## 2019-06-20 NOTE — Patient Instructions (Signed)
Durvalumab injection What is this medicine? DURVALUMAB (dur VAL ue mab) is a monoclonal antibody. It is used to treat urothelial cancer and lung cancer. This medicine may be used for other purposes; ask your health care provider or pharmacist if you have questions. COMMON BRAND NAME(S): IMFINZI What should I tell my health care provider before I take this medicine? They need to know if you have any of these conditions:  diabetes  immune system problems  infection  inflammatory bowel disease  kidney disease  liver disease  lung or breathing disease  lupus  organ transplant  stomach or intestine problems  thyroid disease  an unusual or allergic reaction to durvalumab, other medicines, foods, dyes, or preservatives  pregnant or trying to get pregnant  breast-feeding How should I use this medicine? This medicine is for infusion into a vein. It is given by a health care professional in a hospital or clinic setting. A special MedGuide will be given to you before each treatment. Be sure to read this information carefully each time. Talk to your pediatrician regarding the use of this medicine in children. Special care may be needed. Overdosage: If you think you have taken too much of this medicine contact a poison control center or emergency room at once. NOTE: This medicine is only for you. Do not share this medicine with others. What if I miss a dose? It is important not to miss your dose. Call your doctor or health care professional if you are unable to keep an appointment. What may interact with this medicine? Interactions have not been studied. This list may not describe all possible interactions. Give your health care provider a list of all the medicines, herbs, non-prescription drugs, or dietary supplements you use. Also tell them if you smoke, drink alcohol, or use illegal drugs. Some items may interact with your medicine. What should I watch for while using this  medicine? This drug may make you feel generally unwell. Continue your course of treatment even though you feel ill unless your doctor tells you to stop. You may need blood work done while you are taking this medicine. Do not become pregnant while taking this medicine or for 3 months after stopping it. Women should inform their doctor if they wish to become pregnant or think they might be pregnant. There is a potential for serious side effects to an unborn child. Talk to your health care professional or pharmacist for more information. Do not breast-feed an infant while taking this medicine or for 3 months after stopping it. What side effects may I notice from receiving this medicine? Side effects that you should report to your doctor or health care professional as soon as possible:  allergic reactions like skin rash, itching or hives, swelling of the face, lips, or tongue  black, tarry stools  bloody or watery diarrhea  breathing problems  change in emotions or moods  change in sex drive  changes in vision  chest pain or chest tightness  chills  confusion  cough  facial flushing  fever  headache  signs and symptoms of high blood sugar such as dizziness; dry mouth; dry skin; fruity breath; nausea; stomach pain; increased hunger or thirst; increased urination  signs and symptoms of liver injury like dark yellow or brown urine; general ill feeling or flu-like symptoms; light-colored stools; loss of appetite; nausea; right upper belly pain; unusually weak or tired; yellowing of the eyes or skin  stomach pain  trouble passing urine or change in  the amount of urine  weight gain or weight loss Side effects that usually do not require medical attention (report these to your doctor or health care professional if they continue or are bothersome):  bone pain  constipation  loss of appetite  muscle pain  nausea  swelling of the ankles, feet, hands  tiredness This list  may not describe all possible side effects. Call your doctor for medical advice about side effects. You may report side effects to FDA at 1-800-FDA-1088. Where should I keep my medicine? This drug is given in a hospital or clinic and will not be stored at home. NOTE: This sheet is a summary. It may not cover all possible information. If you have questions about this medicine, talk to your doctor, pharmacist, or health care provider.  2020 Elsevier/Gold Standard (2016-07-20 19:25:04)

## 2019-06-21 ENCOUNTER — Other Ambulatory Visit: Payer: Self-pay | Admitting: Internal Medicine

## 2019-06-21 ENCOUNTER — Encounter (HOSPITAL_COMMUNITY)
Admission: RE | Admit: 2019-06-21 | Discharge: 2019-06-21 | Disposition: A | Discharge status: Home / Self Care | Payer: Medicare Other | Source: Ambulatory Visit | Attending: Surgery | Admitting: Surgery

## 2019-06-21 ENCOUNTER — Ambulatory Visit (HOSPITAL_COMMUNITY)
Admission: RE | Admit: 2019-06-21 | Discharge: 2019-06-21 | Disposition: A | Discharge status: Home / Self Care | Payer: Medicare Other | Source: Ambulatory Visit | Attending: Anesthesiology | Admitting: Anesthesiology

## 2019-06-21 ENCOUNTER — Other Ambulatory Visit (HOSPITAL_COMMUNITY)
Admission: RE | Admit: 2019-06-21 | Discharge: 2019-06-21 | Disposition: A | Discharge status: Home / Self Care | Payer: Medicare Other | Source: Ambulatory Visit | Attending: Surgery | Admitting: Surgery

## 2019-06-21 ENCOUNTER — Encounter (HOSPITAL_COMMUNITY): Payer: Self-pay

## 2019-06-21 ENCOUNTER — Other Ambulatory Visit: Payer: Self-pay | Admitting: Cardiology

## 2019-06-21 ENCOUNTER — Other Ambulatory Visit: Payer: Self-pay

## 2019-06-21 DIAGNOSIS — Z20822 Contact with and (suspected) exposure to covid-19: Secondary | ICD-10-CM | POA: Diagnosis not present

## 2019-06-21 DIAGNOSIS — Z8546 Personal history of malignant neoplasm of prostate: Secondary | ICD-10-CM | POA: Insufficient documentation

## 2019-06-21 DIAGNOSIS — D440 Neoplasm of uncertain behavior of thyroid gland: Secondary | ICD-10-CM | POA: Diagnosis not present

## 2019-06-21 DIAGNOSIS — Z955 Presence of coronary angioplasty implant and graft: Secondary | ICD-10-CM | POA: Insufficient documentation

## 2019-06-21 DIAGNOSIS — E041 Nontoxic single thyroid nodule: Secondary | ICD-10-CM | POA: Insufficient documentation

## 2019-06-21 DIAGNOSIS — I10 Essential (primary) hypertension: Secondary | ICD-10-CM | POA: Diagnosis not present

## 2019-06-21 DIAGNOSIS — E785 Hyperlipidemia, unspecified: Secondary | ICD-10-CM | POA: Diagnosis not present

## 2019-06-21 DIAGNOSIS — Z87891 Personal history of nicotine dependence: Secondary | ICD-10-CM | POA: Diagnosis not present

## 2019-06-21 DIAGNOSIS — Z85118 Personal history of other malignant neoplasm of bronchus and lung: Secondary | ICD-10-CM | POA: Diagnosis not present

## 2019-06-21 DIAGNOSIS — I2510 Atherosclerotic heart disease of native coronary artery without angina pectoris: Secondary | ICD-10-CM | POA: Insufficient documentation

## 2019-06-21 DIAGNOSIS — K219 Gastro-esophageal reflux disease without esophagitis: Secondary | ICD-10-CM | POA: Insufficient documentation

## 2019-06-21 DIAGNOSIS — R918 Other nonspecific abnormal finding of lung field: Secondary | ICD-10-CM | POA: Diagnosis not present

## 2019-06-21 DIAGNOSIS — Z79899 Other long term (current) drug therapy: Secondary | ICD-10-CM | POA: Insufficient documentation

## 2019-06-21 DIAGNOSIS — Z01811 Encounter for preprocedural respiratory examination: Secondary | ICD-10-CM

## 2019-06-21 DIAGNOSIS — Z01818 Encounter for other preprocedural examination: Secondary | ICD-10-CM | POA: Insufficient documentation

## 2019-06-21 HISTORY — DX: Prediabetes: R73.03

## 2019-06-21 LAB — CBC
HCT: 42.9 % (ref 39.0–52.0)
Hemoglobin: 13.4 g/dL (ref 13.0–17.0)
MCH: 28.7 pg (ref 26.0–34.0)
MCHC: 31.2 g/dL (ref 30.0–36.0)
MCV: 91.9 fL (ref 80.0–100.0)
Platelets: 223 10*3/uL (ref 150–400)
RBC: 4.67 MIL/uL (ref 4.22–5.81)
RDW: 14.6 % (ref 11.5–15.5)
WBC: 4.8 10*3/uL (ref 4.0–10.5)
nRBC: 0 % (ref 0.0–0.2)

## 2019-06-21 LAB — HEMOGLOBIN A1C
Hgb A1c MFr Bld: 5.9 % — ABNORMAL HIGH (ref 4.8–5.6)
Mean Plasma Glucose: 122.63 mg/dL

## 2019-06-21 LAB — BASIC METABOLIC PANEL
Anion gap: 6 (ref 5–15)
BUN: 12 mg/dL (ref 8–23)
CO2: 25 mmol/L (ref 22–32)
Calcium: 9.2 mg/dL (ref 8.9–10.3)
Chloride: 111 mmol/L (ref 98–111)
Creatinine, Ser: 0.98 mg/dL (ref 0.61–1.24)
GFR calc Af Amer: >60 mL/min (ref >60)
GFR calc non Af Amer: >60 mL/min (ref >60)
Glucose, Bld: 132 mg/dL — ABNORMAL HIGH (ref 70–99)
Potassium: 4.2 mmol/L (ref 3.5–5.1)
Sodium: 142 mmol/L (ref 135–145)

## 2019-06-21 LAB — SARS CORONAVIRUS 2 (TAT 6-24 HRS): SARS Coronavirus 2: NEGATIVE

## 2019-06-21 NOTE — Progress Notes (Signed)
PCP - Derwood Kaplan. LOV: 06/20/19 Cardiologist -   Chest x-ray - 05/07/19 EKG - 02/14/19 Stress Test -  ECHO -  Cardiac Cath -   Pulmonary Function: 04/25/19 Sleep Study -  CPAP -   Fasting Blood Sugar -  Checks Blood Sugar _____ times a day  Blood Thinner Instructions: Aspirin Instructions: Last Dose:  Anesthesia review:  Pt. Last chemo treatment was 06/20/19.  Patient denies shortness of breath, fever, cough and chest pain at PAT appointment   Patient verbalized understanding of instructions that were given to them at the PAT appointment. Patient was also instructed that they will need to review over the PAT instructions again at home before surgery.

## 2019-06-22 ENCOUNTER — Telehealth: Payer: Self-pay | Admitting: Internal Medicine

## 2019-06-22 NOTE — Progress Notes (Signed)
Anesthesia Chart Review   Case: [id number] Date/Time: 06/25/19 0715   Procedure: TOTAL THYROIDECTOMY (N/A )   Anesthesia type: General   Pre-op diagnosis: THYROID NEOPLASM OF UNCERTAIN BEHAVIOR, LEFT THYROID NODULE   Location: [facility] ROOM 02 / [facility]   Surgeons: Armandina Gemma, MD      DISCUSSION:73 y.o. former smoker (3 pack years, quit 05/24/78) with h/o HLD, GERD, HTN, prostate cancer, CAD (DES to [facility] 2016), thyroid neoplasm, left thyroid nodule scheduled for above procedure 06/25/19 with Dr. Armandina Gemma.   Pt last seen by cardiologist, Dr. Fransico Him, 09/18/2018.  Stable at this visit with no anginal symptoms. 1 year follow up recommended.   Anticipate pt can proceed with planned procedure barring acute status change.   VS: Ht 5\' 7"  (1.702 m)   Wt 85.7 kg   BMI 29.60 kg/m   PROVIDERS: [name], MD is PCP   Fransico Him, MD is Cardiologist  LABS: Labs reviewed: Acceptable for surgery. (all labs ordered are listed, but only abnormal results are displayed)  Labs Reviewed - No data to display   IMAGES:   EKG: 02/14/2019 Rate 75 bpm  Sinus rhythm  Incomplete RBBB and LAFB Borderline ST elevation, lateral leads   CV:  Past Medical History:  Diagnosis Date  . ALLERGIC RHINITIS 01/30/2007  . Arthritis    "hands" (05/29/2014)  . Cancer ([place])    lung, prostate  . Constipation   . Coronary artery disease    LHC (05/29/14): pLAD 50-70, pRCA 99 (L>R collats), EF 55% >> PCI: 3.5 x 28 mm Xience [employer]pine DES to [employer]  . Cough secondary to angiotensin converting enzyme inhibitor (ACE-I) 05/28/2013  . ED (erectile dysfunction)   . GERD 09/26/2007  . HYPERLIPIDEMIA 09/26/2007  . Hypertension   . Lung cancer ([place]) dx'd 03/2018  . PLANTAR FASCIITIS, BILATERAL 07/08/2009  . Pre-diabetes   . Prostate CA ([place]) dx'd 2017 or 2018   xrt  . Unspecified hearing loss 06/16/2009   wears aides both ears    Past Surgical History:  Procedure Laterality Date  . CARDIAC CATHETERIZATION  05/29/2014    Procedure: CORONARY STENT INTERVENTION;  Surgeon: Sinclair Grooms, MD;  Location: [facility] CATH LAB;  Service: Cardiovascular;;  Prox RCA  . COLONOSCOPY    . CORONARY ANGIOPLASTY WITH STENT PLACEMENT  05/29/2014   "1"  . KNEE ARTHROSCOPY Right ~ 2005  . LEFT HEART CATHETERIZATION WITH CORONARY ANGIOGRAM N/A 05/29/2014   Procedure: LEFT HEART CATHETERIZATION WITH CORONARY ANGIOGRAM;  Surgeon: Sinclair Grooms, MD;  Location: [facility] CATH LAB;  Service: Cardiovascular;  Laterality: N/A;  . VIDEO BRONCHOSCOPY WITH ENDOBRONCHIAL ULTRASOUND N/A 02/20/2019   Procedure: VIDEO BRONCHOSCOPY WITH ENDOBRONCHIAL ULTRASOUND;  Surgeon: Garner Nash, DO;  Location: [facility] OR;  Service: Thoracic;  Laterality: N/A;    MEDICATIONS: . benzonatate (TESSALON) 100 MG capsule  . cetirizine (ZYRTEC) 10 MG tablet  . Cyanocobalamin (VITAMIN B-12 CR PO)  . diphenhydrAMINE (ALLERGY) 25 MG tablet  . ezetimibe (ZETIA) 10 MG tablet  . fluticasone (FLONASE) 50 MCG/ACT nasal spray  . guaifenesin (TUSSIN) 100 MG/5ML syrup  . hydrochlorothiazide (MICROZIDE) 12.5 MG capsule  . losartan (COZAAR) 50 MG tablet  . metoprolol tartrate (LOPRESSOR) 25 MG tablet  . Multiple Vitamin (MULTIVITAMIN WITH MINERALS) TABS tablet  . omeprazole (PRILOSEC) 40 MG capsule  . simvastatin (ZOCOR) 40 MG tablet  . tamsulosin (FLOMAX) 0.4 MG CAPS capsule   No current facility-administered medications for this encounter.    Carl Felix,  PA-C [facility] [phone] 06/22/19  1:36 PM

## 2019-06-22 NOTE — Telephone Encounter (Signed)
Scheduled per los. Called and left msg. Mailed printout  °

## 2019-06-24 NOTE — Anesthesia Preprocedure Evaluation (Addendum)
Anesthesia Evaluation  Patient identified by MRN, date of birth, ID band Patient awake    Reviewed: Allergy & Precautions, NPO status , Patient's Chart, lab work & pertinent test results  Airway Mallampati: II       Dental  (+) Dental Advisory Given, Poor Dentition,    Pulmonary neg pulmonary ROS, former smoker,  RUL mass   Pulmonary exam normal        Cardiovascular hypertension, Pt. on home beta blockers and Pt. on medications + CAD, + Cardiac Stents and + Peripheral Vascular Disease  Normal cardiovascular exam Rhythm:Regular Rate:Normal  TTE 2015 EF 60-65%, valves ok  LHC 2016 IMPRESSIONS:1. Subtotally occluded native right coronary with 99% proximal stenosis and TIMI grade 2 flow. The distal right coronary territory is supplied by left to right collaterals from both the LAD and circumflex. 2. Successful PTCA and stenting of the proximal RCA from 99% to 0% with TIMI grade 3 flow (final diameter 3.5 mm) 3. Intermediate stenosis involving the LAD with a proximal to mid segmental 50-70% stenosis. We did not treat this region or perform FFR given the normal anterior wall perfusion on nuclear scintigraphy. 4. Circumflex coronary artery is widely patent 5. Normal left ventricular function   Neuro/Psych negative neurological ROS  negative psych ROS   GI/Hepatic Neg liver ROS, GERD  Medicated,  Endo/Other  diabetes, Type 2  Renal/GU negative Renal ROS  negative genitourinary   Musculoskeletal  (+) Arthritis ,   Abdominal Normal abdominal exam  (+)   Peds  Hematology negative hematology ROS (+)   Anesthesia Other Findings   Reproductive/Obstetrics                            Anesthesia Physical  Anesthesia Plan  ASA: III  Anesthesia Plan: General   Post-op Pain Management:    Induction: Intravenous  PONV Risk Score and Plan: 2 and Midazolam, Dexamethasone and Ondansetron  Airway  Management Planned: Oral ETT  Additional Equipment: None  Intra-op Plan:   Post-operative Plan: Extubation in OR  Informed Consent: I have reviewed the patients History and Physical, chart, labs and discussed the procedure including the risks, benefits and alternatives for the proposed anesthesia with the patient or authorized representative who has indicated his/her understanding and acceptance.     Dental advisory given  Plan Discussed with: CRNA  Anesthesia Plan Comments: ( )       Anesthesia Quick Evaluation

## 2019-06-25 ENCOUNTER — Ambulatory Visit (HOSPITAL_COMMUNITY): Payer: Medicare Other | Admitting: Anesthesiology

## 2019-06-25 ENCOUNTER — Encounter (HOSPITAL_COMMUNITY)
Admission: RE | Disposition: A | Discharge status: Home / Self Care | Payer: Self-pay | Source: Home / Self Care | Attending: Surgery

## 2019-06-25 ENCOUNTER — Telehealth: Payer: Self-pay | Admitting: Internal Medicine

## 2019-06-25 ENCOUNTER — Other Ambulatory Visit: Payer: Self-pay

## 2019-06-25 ENCOUNTER — Encounter (HOSPITAL_COMMUNITY): Payer: Self-pay | Admitting: Surgery

## 2019-06-25 ENCOUNTER — Ambulatory Visit (HOSPITAL_COMMUNITY): Payer: Medicare Other | Admitting: Physician Assistant

## 2019-06-25 ENCOUNTER — Ambulatory Visit (HOSPITAL_COMMUNITY)
Admission: RE | Admit: 2019-06-25 | Discharge: 2019-06-26 | Disposition: A | Discharge status: Home / Self Care | Payer: Medicare Other | Attending: Surgery | Admitting: Surgery

## 2019-06-25 DIAGNOSIS — K219 Gastro-esophageal reflux disease without esophagitis: Secondary | ICD-10-CM | POA: Insufficient documentation

## 2019-06-25 DIAGNOSIS — Z955 Presence of coronary angioplasty implant and graft: Secondary | ICD-10-CM | POA: Insufficient documentation

## 2019-06-25 DIAGNOSIS — Z79899 Other long term (current) drug therapy: Secondary | ICD-10-CM | POA: Insufficient documentation

## 2019-06-25 DIAGNOSIS — Z803 Family history of malignant neoplasm of breast: Secondary | ICD-10-CM | POA: Insufficient documentation

## 2019-06-25 DIAGNOSIS — I10 Essential (primary) hypertension: Secondary | ICD-10-CM | POA: Insufficient documentation

## 2019-06-25 DIAGNOSIS — E118 Type 2 diabetes mellitus with unspecified complications: Secondary | ICD-10-CM | POA: Diagnosis not present

## 2019-06-25 DIAGNOSIS — I2510 Atherosclerotic heart disease of native coronary artery without angina pectoris: Secondary | ICD-10-CM | POA: Insufficient documentation

## 2019-06-25 DIAGNOSIS — C61 Malignant neoplasm of prostate: Secondary | ICD-10-CM | POA: Diagnosis not present

## 2019-06-25 DIAGNOSIS — Z87891 Personal history of nicotine dependence: Secondary | ICD-10-CM | POA: Diagnosis not present

## 2019-06-25 DIAGNOSIS — M1990 Unspecified osteoarthritis, unspecified site: Secondary | ICD-10-CM | POA: Insufficient documentation

## 2019-06-25 DIAGNOSIS — E785 Hyperlipidemia, unspecified: Secondary | ICD-10-CM | POA: Diagnosis not present

## 2019-06-25 DIAGNOSIS — N400 Enlarged prostate without lower urinary tract symptoms: Secondary | ICD-10-CM | POA: Insufficient documentation

## 2019-06-25 DIAGNOSIS — J309 Allergic rhinitis, unspecified: Secondary | ICD-10-CM | POA: Diagnosis not present

## 2019-06-25 DIAGNOSIS — E041 Nontoxic single thyroid nodule: Secondary | ICD-10-CM | POA: Diagnosis present

## 2019-06-25 DIAGNOSIS — D440 Neoplasm of uncertain behavior of thyroid gland: Secondary | ICD-10-CM | POA: Diagnosis not present

## 2019-06-25 DIAGNOSIS — Z888 Allergy status to other drugs, medicaments and biological substances status: Secondary | ICD-10-CM | POA: Diagnosis not present

## 2019-06-25 DIAGNOSIS — Z833 Family history of diabetes mellitus: Secondary | ICD-10-CM | POA: Insufficient documentation

## 2019-06-25 DIAGNOSIS — I739 Peripheral vascular disease, unspecified: Secondary | ICD-10-CM | POA: Insufficient documentation

## 2019-06-25 DIAGNOSIS — C3490 Malignant neoplasm of unspecified part of unspecified bronchus or lung: Secondary | ICD-10-CM | POA: Insufficient documentation

## 2019-06-25 DIAGNOSIS — Z8261 Family history of arthritis: Secondary | ICD-10-CM | POA: Insufficient documentation

## 2019-06-25 DIAGNOSIS — C73 Malignant neoplasm of thyroid gland: Secondary | ICD-10-CM | POA: Insufficient documentation

## 2019-06-25 HISTORY — PX: THYROIDECTOMY: SHX17

## 2019-06-25 SURGERY — THYROIDECTOMY
Anesthesia: General | Site: Neck

## 2019-06-25 MED ORDER — ACETAMINOPHEN 325 MG PO TABS: 650.0000 mg | ORAL_TABLET | Freq: Four times a day (QID) | ORAL | Status: DC | PRN

## 2019-06-25 MED ORDER — MEPERIDINE HCL 50 MG/ML IJ SOLN: 6.2500 mg | INTRAMUSCULAR | Status: DC | PRN

## 2019-06-25 MED ORDER — PHENYLEPHRINE 40 MCG/ML (10ML) SYRINGE FOR IV PUSH (FOR BLOOD PRESSURE SUPPORT)
PREFILLED_SYRINGE | INTRAVENOUS | Status: DC | PRN
  Administered 2019-06-25 (×2): 120 ug via INTRAVENOUS

## 2019-06-25 MED ORDER — FENTANYL CITRATE (PF) 100 MCG/2ML IJ SOLN
INTRAMUSCULAR | Status: AC
  Filled 2019-06-25: qty 2

## 2019-06-25 MED ORDER — ROCURONIUM BROMIDE 10 MG/ML (PF) SYRINGE
PREFILLED_SYRINGE | INTRAVENOUS | Status: AC
  Filled 2019-06-25: qty 10

## 2019-06-25 MED ORDER — ROCURONIUM BROMIDE 10 MG/ML (PF) SYRINGE
PREFILLED_SYRINGE | INTRAVENOUS | Status: DC | PRN
  Administered 2019-06-25: 70 mg via INTRAVENOUS
  Administered 2019-06-25: 10 mg via INTRAVENOUS

## 2019-06-25 MED ORDER — DEXAMETHASONE SODIUM PHOSPHATE 10 MG/ML IJ SOLN
INTRAMUSCULAR | Status: DC | PRN
  Administered 2019-06-25: 8 mg via INTRAVENOUS

## 2019-06-25 MED ORDER — TAMSULOSIN HCL 0.4 MG PO CAPS
0.4000 mg | ORAL_CAPSULE | Freq: Every day | ORAL | Status: DC
  Administered 2019-06-26: 0.4 mg via ORAL
  Filled 2019-06-25 (×2): qty 1

## 2019-06-25 MED ORDER — KETOROLAC TROMETHAMINE 15 MG/ML IJ SOLN: 15.0000 mg | Freq: Once | INTRAMUSCULAR | Status: DC

## 2019-06-25 MED ORDER — CHLORHEXIDINE GLUCONATE CLOTH 2 % EX PADS: 6.0000 | MEDICATED_PAD | Freq: Once | CUTANEOUS | Status: DC

## 2019-06-25 MED ORDER — METOPROLOL TARTRATE 25 MG PO TABS
25.0000 mg | ORAL_TABLET | Freq: Two times a day (BID) | ORAL | Status: DC
  Administered 2019-06-25 – 2019-06-26 (×2): 25 mg via ORAL
  Filled 2019-06-25 (×2): qty 1

## 2019-06-25 MED ORDER — HYDROMORPHONE HCL 1 MG/ML IJ SOLN
1.0000 mg | INTRAMUSCULAR | Status: DC | PRN
  Administered 2019-06-25: 1 mg via INTRAVENOUS
  Filled 2019-06-25: qty 1

## 2019-06-25 MED ORDER — CEFAZOLIN SODIUM-DEXTROSE 2-4 GM/100ML-% IV SOLN
INTRAVENOUS | Status: AC
  Filled 2019-06-25: qty 100

## 2019-06-25 MED ORDER — TRAMADOL HCL 50 MG PO TABS: 50.0000 mg | ORAL_TABLET | Freq: Four times a day (QID) | ORAL | Status: DC | PRN

## 2019-06-25 MED ORDER — FENTANYL CITRATE (PF) 100 MCG/2ML IJ SOLN
INTRAMUSCULAR | Status: DC | PRN
  Administered 2019-06-25 (×4): 50 ug via INTRAVENOUS

## 2019-06-25 MED ORDER — SUGAMMADEX SODIUM 200 MG/2ML IV SOLN
INTRAVENOUS | Status: DC | PRN
  Administered 2019-06-25: 200 mg via INTRAVENOUS

## 2019-06-25 MED ORDER — KETOROLAC TROMETHAMINE 30 MG/ML IJ SOLN
INTRAMUSCULAR | Status: AC
  Filled 2019-06-25: qty 1

## 2019-06-25 MED ORDER — PROMETHAZINE HCL 25 MG/ML IJ SOLN: 6.2500 mg | INTRAMUSCULAR | Status: DC | PRN

## 2019-06-25 MED ORDER — LIDOCAINE 2% (20 MG/ML) 5 ML SYRINGE
INTRAMUSCULAR | Status: AC
  Filled 2019-06-25: qty 5

## 2019-06-25 MED ORDER — KCL IN DEXTROSE-NACL 20-5-0.45 MEQ/L-%-% IV SOLN
INTRAVENOUS | Status: DC
  Filled 2019-06-25: qty 1000

## 2019-06-25 MED ORDER — 0.9 % SODIUM CHLORIDE (POUR BTL) OPTIME
TOPICAL | Status: DC | PRN
  Administered 2019-06-25: 1000 mL

## 2019-06-25 MED ORDER — ONDANSETRON HCL 4 MG/2ML IJ SOLN
4.0000 mg | Freq: Four times a day (QID) | INTRAMUSCULAR | Status: DC | PRN
  Administered 2019-06-25: 20:00:00 4 mg via INTRAVENOUS
  Filled 2019-06-25: qty 2

## 2019-06-25 MED ORDER — PANTOPRAZOLE SODIUM 40 MG PO TBEC
40.0000 mg | DELAYED_RELEASE_TABLET | Freq: Every day | ORAL | Status: DC
  Administered 2019-06-25 – 2019-06-26 (×2): 40 mg via ORAL
  Filled 2019-06-25 (×2): qty 1

## 2019-06-25 MED ORDER — LACTATED RINGERS IV SOLN: INTRAVENOUS | Status: DC

## 2019-06-25 MED ORDER — LIDOCAINE 2% (20 MG/ML) 5 ML SYRINGE
INTRAMUSCULAR | Status: DC | PRN
  Administered 2019-06-25: 40 mg via INTRAVENOUS
  Administered 2019-06-25: 100 mg via INTRAVENOUS

## 2019-06-25 MED ORDER — CEFAZOLIN SODIUM-DEXTROSE 2-4 GM/100ML-% IV SOLN
2.0000 g | INTRAVENOUS | Status: AC
  Administered 2019-06-25: 07:00:00 2 g via INTRAVENOUS

## 2019-06-25 MED ORDER — DEXAMETHASONE SODIUM PHOSPHATE 10 MG/ML IJ SOLN
INTRAMUSCULAR | Status: AC
  Filled 2019-06-25: qty 1

## 2019-06-25 MED ORDER — ONDANSETRON HCL 4 MG/2ML IJ SOLN
INTRAMUSCULAR | Status: AC
  Filled 2019-06-25: qty 2

## 2019-06-25 MED ORDER — ONDANSETRON 4 MG PO TBDP: 4.0000 mg | ORAL_TABLET | Freq: Four times a day (QID) | ORAL | Status: DC | PRN

## 2019-06-25 MED ORDER — ONDANSETRON HCL 4 MG/2ML IJ SOLN
INTRAMUSCULAR | Status: DC | PRN
  Administered 2019-06-25: 4 mg via INTRAVENOUS

## 2019-06-25 MED ORDER — HYDROMORPHONE HCL 1 MG/ML IJ SOLN
INTRAMUSCULAR | Status: AC
  Filled 2019-06-25: qty 2

## 2019-06-25 MED ORDER — HYDROMORPHONE HCL 1 MG/ML IJ SOLN: 0.2500 mg | INTRAMUSCULAR | Status: DC | PRN

## 2019-06-25 MED ORDER — CALCIUM CARBONATE 1250 (500 CA) MG PO TABS
2.0000 | ORAL_TABLET | Freq: Three times a day (TID) | ORAL | Status: DC
  Administered 2019-06-25 – 2019-06-26 (×3): 1000 mg via ORAL
  Filled 2019-06-25 (×3): qty 1

## 2019-06-25 MED ORDER — ACETAMINOPHEN 650 MG RE SUPP: 650.0000 mg | Freq: Four times a day (QID) | RECTAL | Status: DC | PRN

## 2019-06-25 MED ORDER — OXYCODONE HCL 5 MG PO TABS
5.0000 mg | ORAL_TABLET | ORAL | Status: DC | PRN
  Administered 2019-06-25 (×2): 10 mg via ORAL
  Filled 2019-06-25 (×2): qty 2

## 2019-06-25 MED ORDER — PROPOFOL 10 MG/ML IV BOLUS
INTRAVENOUS | Status: DC | PRN
  Administered 2019-06-25: 50 mg via INTRAVENOUS
  Administered 2019-06-25: 150 mg via INTRAVENOUS

## 2019-06-25 MED ORDER — LOSARTAN POTASSIUM 50 MG PO TABS
50.0000 mg | ORAL_TABLET | Freq: Every day | ORAL | Status: DC
  Administered 2019-06-25 – 2019-06-26 (×2): 50 mg via ORAL
  Filled 2019-06-25 (×2): qty 1

## 2019-06-25 MED ORDER — PROPOFOL 10 MG/ML IV BOLUS
INTRAVENOUS | Status: AC
  Filled 2019-06-25: qty 20

## 2019-06-25 SURGICAL SUPPLY — 29 items
ATTRACTOMAT 16X20 MAGNETIC DRP (DRAPES) ×3 IMPLANT
BLADE SURG 15 STRL LF DISP TIS (BLADE) ×1 IMPLANT
BLADE SURG 15 STRL SS (BLADE) ×2
CHLORAPREP W/TINT 26 (MISCELLANEOUS) ×3 IMPLANT
CLIP VESOCCLUDE MED 6/CT (CLIP) ×9 IMPLANT
CLIP VESOCCLUDE SM WIDE 6/CT (CLIP) ×12 IMPLANT
COVER SURGICAL LIGHT HANDLE (MISCELLANEOUS) ×3 IMPLANT
COVER WAND RF STERILE (DRAPES) ×3 IMPLANT
DERMABOND ADVANCED (GAUZE/BANDAGES/DRESSINGS) ×2
DERMABOND ADVANCED .7 DNX12 (GAUZE/BANDAGES/DRESSINGS) ×1 IMPLANT
DRAPE LAPAROTOMY T 98X78 PEDS (DRAPES) ×3 IMPLANT
ELECT REM PT RETURN 15FT ADLT (MISCELLANEOUS) ×3 IMPLANT
GAUZE 4X4 16PLY RFD (DISPOSABLE) ×3 IMPLANT
GLOVE SURG ORTHO 8.0 STRL STRW (GLOVE) ×3 IMPLANT
GOWN STRL REUS W/TWL XL LVL3 (GOWN DISPOSABLE) ×6 IMPLANT
HEMOSTAT SURGICEL 2X4 FIBR (HEMOSTASIS) ×3 IMPLANT
ILLUMINATOR WAVEGUIDE N/F (MISCELLANEOUS) ×3 IMPLANT
KIT BASIN OR (CUSTOM PROCEDURE TRAY) ×3 IMPLANT
KIT TURNOVER KIT A (KITS) IMPLANT
PACK BASIC VI WITH GOWN DISP (CUSTOM PROCEDURE TRAY) ×3 IMPLANT
PENCIL SMOKE EVACUATOR (MISCELLANEOUS) IMPLANT
SHEARS HARMONIC 9CM CVD (BLADE) ×3 IMPLANT
SUT MNCRL AB 4-0 PS2 18 (SUTURE) ×3 IMPLANT
SUT VIC AB 3-0 SH 18 (SUTURE) ×6 IMPLANT
SYR BULB IRRIGATION 50ML (SYRINGE) ×3 IMPLANT
TOWEL OR 17X26 10 PK STRL BLUE (TOWEL DISPOSABLE) ×3 IMPLANT
TOWEL OR NON WOVEN STRL DISP B (DISPOSABLE) ×3 IMPLANT
TUBING CONNECTING 10 (TUBING) ×2 IMPLANT
TUBING CONNECTING 10' (TUBING) ×1

## 2019-06-25 NOTE — Anesthesia Postprocedure Evaluation (Signed)
Anesthesia Post Note  Patient: Carl Harrell  Procedure(s) Performed: TOTAL THYROIDECTOMY (N/A Neck)     Patient location during evaluation: PACU Anesthesia Type: General Level of consciousness: sedated Pain management: pain level controlled Vital Signs Assessment: post-procedure vital signs reviewed and stable Respiratory status: spontaneous breathing Cardiovascular status: stable Postop Assessment: no apparent nausea or vomiting Anesthetic complications: no    Last Vitals:  Vitals:   06/25/19 1000 06/25/19 1015  BP: (!) 130/92 (!) 134/92  Pulse: 82 83  Resp: 18 13  Temp:  36.6 C  SpO2: 92% 92%    Last Pain:  Vitals:   06/25/19 1015  TempSrc:   PainSc: Asleep   Pain Goal:                   Huston Foley

## 2019-06-25 NOTE — Interval H&P Note (Signed)
History and Physical Interval Note:  06/25/2019 6:57 AM  Carl Harrell  has presented today for surgery, with the diagnosis of THYROID NEOPLASM OF UNCERTAIN BEHAVIOR, LEFT THYROID NODULE.  The various methods of treatment have been discussed with the patient and family. After consideration of risks, benefits and other options for treatment, the patient has consented to    Procedure(s): TOTAL THYROIDECTOMY (N/A) as a surgical intervention.    The patient's history has been reviewed, patient examined, no change in status, stable for surgery.  I have reviewed the patient's chart and labs.  Questions were answered to the patient's satisfaction.    Armandina Gemma, MD [facility] Office: [place]

## 2019-06-25 NOTE — Telephone Encounter (Signed)
Scheduled per los. Called and left msg. Mailed printout  °

## 2019-06-25 NOTE — Anesthesia Procedure Notes (Signed)
Procedure Name: Intubation Date/Time: 06/25/2019 7:21 AM Performed by: Niel Hummer, CRNA Pre-anesthesia Checklist: Patient identified, Emergency Drugs available, Suction available and Patient being monitored Patient Re-evaluated:Patient Re-evaluated prior to induction Oxygen Delivery Method: Circle system utilized Preoxygenation: Pre-oxygenation with 100% oxygen Induction Type: IV induction Ventilation: Mask ventilation without difficulty and Oral airway inserted - appropriate to patient size Laryngoscope Size: Mac and 4 Grade View: Grade I Tube type: Oral Tube size: 7.5 mm Number of attempts: 1 Airway Equipment and Method: Stylet Placement Confirmation: ETT inserted through vocal cords under direct vision,  positive ETCO2 and breath sounds checked- equal and bilateral Secured at: 22 cm Tube secured with: Tape Dental Injury: Teeth and Oropharynx as per pre-operative assessment

## 2019-06-25 NOTE — Op Note (Signed)
Procedure Note  Pre-operative Diagnosis:  Thyroid neoplasm of uncertain behavior, left thyroid nodule  Post-operative Diagnosis:  same  Surgeon:  Armandina Gemma, MD  Assistant:  none   Procedure:  Total thyroidectomy  Anesthesia:  General  Estimated Blood Loss:  minimal  Drains: none         Specimen: thyroid to pathology  Indications:  Patient is referred by Dr. Bernardo Heater for surgical evaluation and management of a left thyroid nodule with cytologic atypia. Patient was undergoing treatment for lung carcinoma. He was noted on a PET scan to have activity in the left thyroid lobe. Subsequent ultrasound in October 2020 demonstrated a solitary 3.0 cm nodule in the inferior left thyroid lobe. Fine-needle aspiration showed a follicular neoplasm, Bethesda category IV. Specimen was subsequently sent for [facility] testing which yielded a suspicious result, rendering a risk of malignancy of 50%. Patient is now referred to surgery for further evaluation and management.   Procedure Details: Procedure was done in OR #2 at the [facility]. The patient was brought to the operating room and placed in a supine position on the operating room table. Following administration of general anesthesia, the patient was positioned and then prepped and draped in the usual aseptic fashion. After ascertaining that an adequate level of anesthesia had been achieved, a small Kocher incision was made with #15 blade. Dissection was carried through subcutaneous tissues and platysma.Hemostasis was achieved with the electrocautery. Skin flaps were elevated cephalad and caudad from the thyroid notch to the sternal notch. A [place]ner self-retaining retractor was placed for exposure. Strap muscles were incised in the midline and dissection was begun on the left side.  Strap muscles were reflected laterally.  Left thyroid lobe was moderately enlarged with a dominant nodule in the inferior pole.  The left lobe was gently  mobilized with blunt dissection. Superior pole vessels were dissected out and divided individually between small and medium ligaclips with the harmonic scalpel. The thyroid lobe was rolled anteriorly. Branches of the inferior thyroid artery were divided between small ligaclips with the harmonic scalpel. Inferior venous tributaries were divided between ligaclips. Both the superior and inferior parathyroid glands were preserved on their vascular pedicles. The recurrent laryngeal nerve was avoided and preserved along its course. The ligament of Gwenlyn Found was released with the electrocautery and the gland was mobilized onto the anterior trachea. Isthmus was mobilized across the midline. There was a small pyramidal lobe present which was resected with the isthmus. Dry pack was placed in the left neck.  The right thyroid lobe was gently mobilized with blunt dissection. Right thyroid lobe was quite small without obvious nodularity. Superior pole vessels were dissected out and divided between small and medium ligaclips with the Harmonic scalpel. Superior parathyroid was identified and preserved. Inferior venous tributaries were divided between medium ligaclips with the harmonic scalpel. The right thyroid lobe was rolled anteriorly and the branches of the inferior thyroid artery divided between small ligaclips. The right recurrent laryngeal nerve was identified and preserved along its course. The ligament of Gwenlyn Found was released with the electrocautery. The right thyroid lobe was mobilized onto the anterior trachea and the remainder of the thyroid was dissected off the anterior trachea and the thyroid was completely excised. A single suture was used to mark the left lobe and a double suture was used to mark the right lobe. The entire thyroid gland was submitted to pathology for review.  The neck was irrigated with warm saline. Fibrillar was placed throughout the operative  field. Strap muscles were approximated in the midline  with interrupted 3-0 Vicryl sutures. Platysma was closed with interrupted 3-0 Vicryl sutures. Skin was closed with a running 4-0 Monocryl subcuticular suture. Wound was washed and Dermabond was applied. The patient was awakened from anesthesia and brought to the recovery room. The patient tolerated the procedure well.   Armandina Gemma, MD [facility] Office: [phone]

## 2019-06-25 NOTE — Transfer of Care (Signed)
Immediate Anesthesia Transfer of Care Note  Patient: Carl Harrell  Procedure(s) Performed: TOTAL THYROIDECTOMY (N/A Neck)  Patient Location: PACU  Anesthesia Type:General  Level of Consciousness: awake, alert  and oriented  Airway & Oxygen Therapy: Patient Spontanous Breathing and Patient connected to face mask oxygen  Post-op Assessment: Report given to RN, Post -op Vital signs reviewed and stable and Patient moving all extremities X 4  Post vital signs: Reviewed and stable  Last Vitals:  Vitals Value Taken Time  BP 115/89 06/25/19 0921  Temp    Pulse 90 06/25/19 0924  Resp 20 06/25/19 0924  SpO2 98 % 06/25/19 0924  Vitals shown include unvalidated device data.  Last Pain:  Vitals:   06/25/19 0517  TempSrc: Oral         Complications: No apparent anesthesia complications

## 2019-06-26 DIAGNOSIS — E118 Type 2 diabetes mellitus with unspecified complications: Secondary | ICD-10-CM | POA: Diagnosis not present

## 2019-06-26 DIAGNOSIS — Z79899 Other long term (current) drug therapy: Secondary | ICD-10-CM | POA: Diagnosis not present

## 2019-06-26 DIAGNOSIS — Z87891 Personal history of nicotine dependence: Secondary | ICD-10-CM | POA: Diagnosis not present

## 2019-06-26 DIAGNOSIS — Z955 Presence of coronary angioplasty implant and graft: Secondary | ICD-10-CM | POA: Diagnosis not present

## 2019-06-26 DIAGNOSIS — E041 Nontoxic single thyroid nodule: Secondary | ICD-10-CM | POA: Diagnosis not present

## 2019-06-26 DIAGNOSIS — Z803 Family history of malignant neoplasm of breast: Secondary | ICD-10-CM | POA: Diagnosis not present

## 2019-06-26 DIAGNOSIS — I2510 Atherosclerotic heart disease of native coronary artery without angina pectoris: Secondary | ICD-10-CM | POA: Diagnosis not present

## 2019-06-26 DIAGNOSIS — Z888 Allergy status to other drugs, medicaments and biological substances status: Secondary | ICD-10-CM | POA: Diagnosis not present

## 2019-06-26 DIAGNOSIS — Z833 Family history of diabetes mellitus: Secondary | ICD-10-CM | POA: Diagnosis not present

## 2019-06-26 DIAGNOSIS — C3490 Malignant neoplasm of unspecified part of unspecified bronchus or lung: Secondary | ICD-10-CM | POA: Diagnosis not present

## 2019-06-26 DIAGNOSIS — I10 Essential (primary) hypertension: Secondary | ICD-10-CM | POA: Diagnosis not present

## 2019-06-26 DIAGNOSIS — I739 Peripheral vascular disease, unspecified: Secondary | ICD-10-CM | POA: Diagnosis not present

## 2019-06-26 DIAGNOSIS — K219 Gastro-esophageal reflux disease without esophagitis: Secondary | ICD-10-CM | POA: Diagnosis not present

## 2019-06-26 DIAGNOSIS — C73 Malignant neoplasm of thyroid gland: Secondary | ICD-10-CM | POA: Diagnosis not present

## 2019-06-26 DIAGNOSIS — M1990 Unspecified osteoarthritis, unspecified site: Secondary | ICD-10-CM | POA: Diagnosis not present

## 2019-06-26 DIAGNOSIS — Z8261 Family history of arthritis: Secondary | ICD-10-CM | POA: Diagnosis not present

## 2019-06-26 LAB — BASIC METABOLIC PANEL
Anion gap: 9 (ref 5–15)
BUN: 15 mg/dL (ref 8–23)
CO2: 23 mmol/L (ref 22–32)
Calcium: 8.5 mg/dL — ABNORMAL LOW (ref 8.9–10.3)
Chloride: 106 mmol/L (ref 98–111)
Creatinine, Ser: 1.14 mg/dL (ref 0.61–1.24)
GFR calc Af Amer: >60 mL/min (ref >60)
GFR calc non Af Amer: >60 mL/min (ref >60)
Glucose, Bld: 157 mg/dL — ABNORMAL HIGH (ref 70–99)
Potassium: 4.2 mmol/L (ref 3.5–5.1)
Sodium: 138 mmol/L (ref 135–145)

## 2019-06-26 MED ORDER — TRAMADOL HCL 50 MG PO TABS: 50.0000 mg | ORAL_TABLET | Freq: Four times a day (QID) | ORAL | 0 refills | Status: DC | PRN

## 2019-06-26 MED ORDER — CALCIUM CARBONATE ANTACID 500 MG PO CHEW: 2.0000 | CHEWABLE_TABLET | Freq: Two times a day (BID) | ORAL | 1 refills | Status: DC

## 2019-06-26 MED ORDER — LEVOTHYROXINE SODIUM 100 MCG PO TABS: 100.0000 ug | ORAL_TABLET | Freq: Every day | ORAL | 3 refills | Status: DC

## 2019-06-26 NOTE — Discharge Summary (Signed)
Physician Discharge Summary [facility]  Patient ID: Carl Harrell MRN: [id number] DOB/AGE: 1946-01-31 74 y.o.  Admit date: 06/25/2019 Discharge date: 06/26/2019  Admission Diagnoses:  Thyroid neoplasm of uncertain behavior  Discharge Diagnoses:  Principal Problem:   Neoplasm of uncertain behavior of thyroid gland Active Problems:   Left thyroid nodule   Discharged Condition: good  Hospital Course: Patient was admitted for observation following thyroid surgery.  Post op course was uncomplicated.  Pain was well controlled.  Tolerated diet.  Post op calcium level on morning following surgery was 8.5 mg/dl.  Patient was prepared for discharge home on POD#1.  Consults: None  Treatments: surgery: total thyroidectomy  Discharge Exam: Blood pressure 114/71, pulse 89, temperature 98.1 F (36.7 C), resp. rate 16, height 5' 7.01" (1.702 m), weight 86.2 kg, SpO2 92 %. HEENT - clear Neck - wound dry and intact; Dermabond in place; mild STS; voice normal Chest - clear bilaterally Cor - RRR   Disposition: Home  Discharge Instructions    Diet - low sodium heart healthy   Complete by: As directed    Discharge instructions   Complete by: As directed    [facility]  THYROID & PARATHYROID SURGERY:  POST-OP INSTRUCTIONS  Always review your discharge instruction sheet from the facility where your surgery was performed.  A prescription for pain medication may be given to you upon discharge.  Take your pain medication as prescribed.  If narcotic pain medicine is not needed, then you may take acetaminophen (Tylenol) or ibuprofen (Advil) as needed.  Take your usually prescribed medications unless otherwise directed.  If you need a refill on your pain medication, please contact our office during regular business hours.  Prescriptions cannot be processed by our office after 5 pm or on weekends.  Start with a light diet upon arrival home, such as soup  and crackers or toast.  Be sure to drink plenty of fluids daily.  Resume your normal diet the day after surgery.  Most patients will experience some swelling and bruising on the chest and neck area.  Ice packs will help.  Swelling and bruising can take several days to resolve.   It is common to experience some constipation after surgery.  Increasing fluid intake and taking a stool softener (Colace) will usually help or prevent this problem.  A mild laxative (Milk of Magnesia or Miralax) should be taken according to package directions if there has been no bowel movement after 48 hours.  You have steri-strips and a gauze dressing over your incision.  You may remove the gauze bandage on the second day after surgery, and you may shower at that time.  Leave your steri-strips (small skin tapes) in place directly over the incision.  These strips should remain on the skin for 5-7 days and then be removed.  You may get them wet in the shower and pat them dry.  You may resume regular (light) daily activities beginning the next day (such as daily self-care, walking, climbing stairs) gradually increasing activities as tolerated.  You may have sexual intercourse when it is comfortable.  Refrain from any heavy lifting or straining until approved by your doctor.  You may drive when you no longer are taking prescription pain medication, you can comfortably wear a seatbelt, and you can safely maneuver your car and apply brakes.  You should see your doctor in the office for a follow-up appointment approximately three weeks after your surgery.  Make  sure that you call for this appointment within a day or two after you arrive home to insure a convenient appointment time.  WHEN TO CALL YOUR DOCTOR: -- Fever greater than 101.5 -- Inability to urinate -- Nausea and/or vomiting - persistent -- Extreme swelling or bruising -- Continued bleeding from incision -- Increased pain, redness, or drainage from the incision --  Difficulty swallowing or breathing -- Muscle cramping or spasms -- Numbness or tingling in hands or around lips  The clinic staff is available to answer your questions during regular business hours.  Please don't hesitate to call and ask to speak to one of the nurses if you have concerns.  Armandina Gemma, MD [facility] Office: [phone]   Ice pack   Complete by: As directed    Increase activity slowly   Complete by: As directed    No dressing needed   Complete by: As directed      Allergies as of 06/26/2019      Reactions   Lisinopril Cough      Medication List    TAKE these medications   Allergy 25 MG tablet Generic drug: diphenhydrAMINE Take 25 mg by mouth daily.   benzonatate 100 MG capsule Commonly known as: TESSALON Take 100 mg by mouth daily as needed for cough.   calcium carbonate 500 MG chewable tablet Commonly known as: Tums Chew 2 tablets (400 mg of elemental calcium total) by mouth 2 (two) times daily.   cetirizine 10 MG tablet Commonly known as: ZYRTEC Take 1 tablet (10 mg total) by mouth daily.   ezetimibe 10 MG tablet Commonly known as: ZETIA TAKE 1 TABLET BY MOUTH EVERY DAY What changed: when to take this   fluticasone 50 MCG/ACT nasal spray Commonly known as: FLONASE SHAKE LIQUID AND USE 1 SPRAY IN EACH NOSTRIL DAILY What changed: See the new instructions.   hydrochlorothiazide 12.5 MG capsule Commonly known as: MICROZIDE Take 1 capsule (12.5 mg total) by mouth daily.   levothyroxine 100 MCG tablet Commonly known as: Synthroid Take 1 tablet (100 mcg total) by mouth daily.   losartan 50 MG tablet Commonly known as: COZAAR TAKE 1 TABLET BY MOUTH EVERY DAY   metoprolol tartrate 25 MG tablet Commonly known as: LOPRESSOR Take 1 tablet (25 mg total) by mouth 2 (two) times daily.   multivitamin with minerals Tabs tablet Take 1 tablet by mouth daily.   omeprazole 40 MG capsule Commonly known as: PRILOSEC TAKE 1  CAPSULE(40 MG) BY MOUTH DAILY What changed: See the new instructions.   simvastatin 40 MG tablet Commonly known as: ZOCOR TAKE 1 TABLET(40 MG) BY MOUTH DAILY AT 6 PM What changed: See the new instructions.   tamsulosin 0.4 MG Caps capsule Commonly known as: FLOMAX Take 1 capsule (0.4 mg total) by mouth daily.   traMADol 50 MG tablet Commonly known as: ULTRAM Take 1-2 tablets (50-100 mg total) by mouth every 6 (six) hours as needed.   Tussin 100 MG/5ML syrup Generic drug: guaifenesin Take 400 mg by mouth 2 (two) times daily.   VITAMIN B-12 CR PO Take 50 mcg by mouth daily.      Follow-up Information    Armandina Gemma, MD. Schedule an appointment as soon as possible for a visit in 3 week(s).   Specialty: General Surgery Contact information: [address] [place] [address] [phone]           Earnstine Regal, MD, [facility] Office:  [phone]   Signed: Armandina Gemma 06/26/2019, 8:55 AM

## 2019-06-26 NOTE — Progress Notes (Signed)
Discharge instructions discussed with patient, verbalized agreement and understanding 

## 2019-06-28 LAB — SURGICAL PATHOLOGY

## 2019-07-17 ENCOUNTER — Encounter: Payer: Self-pay | Admitting: Internal Medicine

## 2019-07-17 DIAGNOSIS — C73 Malignant neoplasm of thyroid gland: Secondary | ICD-10-CM | POA: Diagnosis not present

## 2019-07-17 DIAGNOSIS — D440 Neoplasm of uncertain behavior of thyroid gland: Secondary | ICD-10-CM | POA: Diagnosis not present

## 2019-07-17 NOTE — Progress Notes (Signed)
[facility] OFFICE PROGRESS NOTE  Carl Ruddy, MD [address] [place] [address]  DIAGNOSIS:  1) Stage IIIA (T2b, N2, M0) non-small cell lung cancer likely adenocarcinoma pending final tissue diagnosis presented with right upper lobe lung mass in addition to right hilar and mediastinal lymphadenopathy diagnosed in September 2020 2) history of prostate adenocarcinoma status post radiotherapy under the care of Dr. Tammi Klippel. 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  Molecular Biomarkers: [id number] Everolimus, Temsirolimus Yes 4.9% [id number] None Yes 4.7% [id number] Yes 0.2% [id number] None No 5.2% [id number] None No 5.0% [id number] None (VUS) No (VUS) 4.4% [id number] (VUS) No (VUS) 3.5% MSI-HighNOT DETECTED  PRIOR THERAPY: Concurrent chemoradiation with weekly carboplatin for AUC of 2 and paclitaxel 45 mg/M2. First dose March 05, 2019. Status post 7 cycles. Last dose was given April 16, 2019 with partial response  CURRENT THERAPY: Consolidation treatment with immunotherapy with Imfinzi'1500mg'$  IV every 4 weeks. First dose May 22, 2019. Status post 2 cycles.   INTERVAL HISTORY: Carl [id number]. male returns to the clinic for a follow-up visit.  In the interval since his last treatment, the patient had a total thyroidectomy on February 1, 21443for follicular neoplasm of the thyroid under the care of Dr. GHarlow Asa He tolerated this procedure well. He has a follow up appointment scheduled with him tomorrow.  Regarding his treatment with immunotherapy with Imfinzi, the patient tolerated his last cycle well without any concerning adverse side effects except for intermittent itching without rash. He will take benadryl for itching.  He denies any fever, chills, night sweats, or weight loss.  He is reporting his baseline  shortness of breath with exertion and productive cough for which he uses a cough drop.  He denies any nausea, vomiting, or constipation. He state has been experiencing intermittent diarrhea for several months even prior to immunotherapy. He has not tried any anti-diarrheal medications..Wilburn Mylar he had 3 loose bowel movements. Denies the presence of blood or abdominal pain. No diarrhea today. He denies any headache or visual changes.  The patient is here today for evaluation before starting cycle #3.    MEDICAL HISTORY: Past Medical History:  Diagnosis Date  . ALLERGIC RHINITIS 01/30/2007  . Arthritis    "hands" (05/29/2014)  . Cancer ([facility]    lung, prostate  . Constipation   . Coronary artery disease    LHC (05/29/14): pLAD 50-70, pRCA 99 (L>R collats), EF 55% >> PCI: 3.5 x 28 mm Xience Alpine DES to [employer] . Cough secondary to angiotensin converting enzyme inhibitor (ACE-I) 05/28/2013  . ED (erectile dysfunction)   . GERD 09/26/2007  . HYPERLIPIDEMIA 09/26/2007  . Hypertension   . Lung cancer ([facility] dx[place] 03/2018  . PLANTAR FASCIITIS, BILATERAL 07/08/2009  . Pre-diabetes   . Prostate CA [place] dx[place] 2017 or 2018   xrt  . Unspecified hearing loss 06/16/2009   wears aides both ears    ALLERGIES:  is allergic to lisinopril.  MEDICATIONS:  Current Outpatient Medications  Medication Sig Dispense Refill  . benzonatate (TESSALON) 100 MG capsule Take 100 mg by mouth daily as needed for cough.     . cetirizine (ZYRTEC) 10 MG tablet Take 1 tablet (10 mg total) by mouth daily. 30 tablet 11  . Cyanocobalamin (VITAMIN B-12 CR PO) Take 50 mcg by mouth daily.     . diphenhydrAMINE (ALLERGY) 25 MG tablet Take 25 mg by mouth daily.    .Marland Kitchen  ezetimibe (ZETIA) 10 MG tablet TAKE 1 TABLET BY MOUTH EVERY DAY (Patient taking differently: Take 10 mg by mouth at bedtime. ) 90 tablet 3  . fluticasone (FLONASE) 50 MCG/ACT nasal spray SHAKE LIQUID AND USE 1 SPRAY IN EACH NOSTRIL DAILY (Patient taking differently: Place 1  spray into both nostrils daily. ) 48 g 0  . guaifenesin (TUSSIN) 100 MG/5ML syrup Take 400 mg by mouth 2 (two) times daily.    Marland Kitchen levothyroxine (SYNTHROID) 100 MCG tablet Take 1 tablet (100 mcg total) by mouth daily. 30 tablet 3  . losartan (COZAAR) 50 MG tablet TAKE 1 TABLET BY MOUTH EVERY DAY 90 tablet 0  . metoprolol tartrate (LOPRESSOR) 25 MG tablet Take 1 tablet (25 mg total) by mouth 2 (two) times daily. 180 tablet 0  . Multiple Vitamin (MULTIVITAMIN WITH MINERALS) TABS tablet Take 1 tablet by mouth daily.    Marland Kitchen omeprazole (PRILOSEC) 40 MG capsule TAKE 1 CAPSULE(40 MG) BY MOUTH DAILY (Patient taking differently: Take 40 mg by mouth daily. ) 90 capsule 1  . simvastatin (ZOCOR) 40 MG tablet TAKE 1 TABLET(40 MG) BY MOUTH DAILY AT 6 PM (Patient taking differently: Take 40 mg by mouth daily at 6 PM. ) 90 tablet 3  . tamsulosin (FLOMAX) 0.4 MG CAPS capsule Take 1 capsule (0.4 mg total) by mouth daily. 30 capsule 5  . calcium carbonate (TUMS) 500 MG chewable tablet Chew 2 tablets (400 mg of elemental calcium total) by mouth 2 (two) times daily. (Patient not taking: Reported on 07/18/2019) 90 tablet 1  . hydrochlorothiazide (MICROZIDE) 12.5 MG capsule Take 1 capsule (12.5 mg total) by mouth daily. (Patient not taking: Reported on 06/12/2019) 90 capsule 3  . traMADol (ULTRAM) 50 MG tablet Take 1-2 tablets (50-100 mg total) by mouth every 6 (six) hours as needed. (Patient not taking: Reported on 07/18/2019) 15 tablet 0   No current facility-administered medications for this visit.    SURGICAL HISTORY:  Past Surgical History:  Procedure Laterality Date  . CARDIAC CATHETERIZATION  05/29/2014   Procedure: CORONARY STENT INTERVENTION;  Surgeon: Sinclair Grooms, MD;  Location: [facility] CATH LAB;  Service: Cardiovascular;;  Prox RCA  . COLONOSCOPY    . CORONARY ANGIOPLASTY WITH STENT PLACEMENT  05/29/2014   "1"  . KNEE ARTHROSCOPY Right ~ 2005  . LEFT HEART CATHETERIZATION WITH CORONARY ANGIOGRAM N/A 05/29/2014    Procedure: LEFT HEART CATHETERIZATION WITH CORONARY ANGIOGRAM;  Surgeon: Sinclair Grooms, MD;  Location: [facility] CATH LAB;  Service: Cardiovascular;  Laterality: N/A;  . THYROIDECTOMY N/A 06/25/2019   Procedure: TOTAL THYROIDECTOMY;  Surgeon: Armandina Gemma, MD;  Location: [facility] ORS;  Service: General;  Laterality: N/A;  . VIDEO BRONCHOSCOPY WITH ENDOBRONCHIAL ULTRASOUND N/A 02/20/2019   Procedure: VIDEO BRONCHOSCOPY WITH ENDOBRONCHIAL ULTRASOUND;  Surgeon: Garner Nash, DO;  Location: [facility];  Service: Thoracic;  Laterality: N/A;    REVIEW OF SYSTEMS:   Review of Systems  Constitutional: Negative for appetite change, chills, fatigue, fever and unexpected weight change.  HENT: Negative for mouth sores, nosebleeds, sore throat and trouble swallowing.  Eyes: Negative for eye problems and icterus.  Respiratory: Positive for baseline shortness of breath with exertion and occasional cough. Negative for hemoptysis and wheezing.   Cardiovascular: Negative for chest pain and leg swelling.  Gastrointestinal: Positive for occasional mild diarrhea. Negative for abdominal pain, constipation, nausea and vomiting.  Genitourinary: Negative for bladder incontinence, difficulty urinating, dysuria, frequency and hematuria.   Musculoskeletal: Negative for back pain, gait  problem, neck pain and neck stiffness.  Skin: Negative for itching and rash.  Neurological: Negative for dizziness, extremity weakness, gait problem, headaches, light-headedness and seizures.  Hematological: Negative for adenopathy. Does not bruise/bleed easily.  Psychiatric/Behavioral: Negative for confusion, depression and sleep disturbance. The patient is not nervous/anxious.     PHYSICAL EXAMINATION:  Blood pressure 109/77, pulse 100, temperature 98.5 F (36.9 C), temperature source Oral, resp. rate 18, weight 199 lb 1.6 oz (90.3 kg), SpO2 97 %.  ECOG PERFORMANCE STATUS: 1 - Symptomatic but completely ambulatory  Physical Exam   Constitutional: Oriented to person, place, and time and well-developed, well-nourished, and in no distress.  HENT:  Head: Normocephalic and atraumatic.  Mouth/Throat: Oropharynx is clear and moist. No oropharyngeal exudate.  Eyes: Conjunctivae are normal. Right eye exhibits no discharge. Left eye exhibits no discharge. No scleral icterus.  Neck: Normal range of motion. Neck supple.  Cardiovascular: Normal rate, regular rhythm, normal heart sounds and intact distal pulses.   Pulmonary/Chest: Effort normal and breath sounds normal. No respiratory distress. No wheezes. No rales.  Abdominal: Soft. Bowel sounds are normal. Exhibits no distension and no mass. There is no tenderness.  Musculoskeletal: Normal range of motion. Exhibits no edema.  Lymphadenopathy:    No cervical adenopathy.  Neurological: Alert and oriented to person, place, and time. Exhibits normal muscle tone. Gait normal. Coordination normal.  Skin: scar along anterior neck from thyroidectomy. Skin is warm and dry. No rash noted. Not diaphoretic. No erythema. No pallor.  Psychiatric: Mood, memory and judgment normal.  Vitals reviewed.  LABORATORY DATA: Lab Results  Component Value Date   WBC 5.6 07/18/2019   HGB 13.8 07/18/2019   HCT 42.9 07/18/2019   MCV 87.0 07/18/2019   PLT 198 07/18/2019      Chemistry      Component Value Date/Time   NA 141 07/18/2019 0930   NA 141 08/05/2016 0826   K 4.1 07/18/2019 0930   CL 108 07/18/2019 0930   CO2 24 07/18/2019 0930   BUN 11 07/18/2019 0930   BUN 14 08/05/2016 0826   CREATININE 0.98 07/18/2019 0930      Component Value Date/Time   CALCIUM 8.7 (L) 07/18/2019 0930   ALKPHOS 73 07/18/2019 0930   AST 17 07/18/2019 0930   ALT 21 07/18/2019 0930   BILITOT 0.4 07/18/2019 0930       RADIOGRAPHIC STUDIES:  DG Chest 2 View  Result Date: 06/21/2019 CLINICAL DATA:  Preop evaluation for thyroid surgery, known history of lung carcinoma EXAM: CHEST - 2 VIEW COMPARISON:   01/31/2019 FINDINGS: Right upper lobe mass lesion appears to have grown with some evidence of right upper lobe consolidation likely related to postobstructive change. Some volume loss on the right is noted as well. The left lung is clear. No sizable effusion is seen. No bony abnormality is noted. IMPRESSION: Increase in size in the right upper lobe mass with volume loss due to postobstructive change. Electronically Signed   By: Inez Catalina M.D.   On: 06/21/2019 13:32     ASSESSMENT/PLAN:  This is a very pleasant63 year old Caucasian male diagnosed with stage IIIa (T2b,N2, M0) non-small cell lung cancer,pathology favoring adenocarcinoma.He presented with a right upper lobe lung mass in addition to right hilar andmediastinal lymphadenopathy. He was diagnosed in September 2020. He is negative for any actionable mutations.   He also was diagnosed with follicular neoplasm of the thyroid. He recently underwent a total thyroidectomy on 06/25/19 under the care of Dr. Harlow Asa.  The patient completed 7 cycles of weekly concurrent chemoradiation with carboplatin for an AUC of 2 and paclitaxel 45 mg/m.   He is currently undergoing consolidation immunotherapy with Imfinzi IV every 4 weeks. He is status post 2 cycles. He tolerated it well without any adverse side effects except for a mild skin rash x1 and itching.   Labs were reviewed. Recommend that he proceed with cycle #3 today as scheduled.   We will see him back for a follow up visit in 4 weeks for evaluation before starting cycle #4.   I will arrange for a restaging CT of his chest scan prior to his next visit.  For the itching, he may take benadryl. However, I cautioned him that it may make him drowsy to be careful to not take it prior to certain activities such as driving.   For his diarrhea, discussed that he may use imodium.  The patient was advised to call immediately if he has any concerning symptoms in the interval. The patient  voices understanding of current disease status and treatment options and is in agreement with the current care plan. All questions were answered. The patient knows to call the clinic with any problems, questions or concerns. We can certainly see the patient much sooner if necessary    Orders Placed This Encounter  Procedures  . CT Chest W Contrast    Standing Status:   Future    Standing Expiration Date:   07/17/2020    Order Specific Question:   ** REASON FOR EXAM (FREE TEXT)    Answer:   Restaging Lung Cancer    Order Specific Question:   If indicated for the ordered procedure, I authorize the administration of contrast media per Radiology protocol    Answer:   Yes    Order Specific Question:   Preferred imaging location?    Answer:   [facility]    Order Specific Question:   Radiology Contrast Protocol - do NOT remove file path    Answer:   [email or web address]     [place], PA-C 07/18/19

## 2019-07-18 ENCOUNTER — Other Ambulatory Visit: Payer: Self-pay

## 2019-07-18 ENCOUNTER — Inpatient Hospital Stay: Payer: Medicare Other | Attending: Internal Medicine | Admitting: Physician Assistant

## 2019-07-18 ENCOUNTER — Encounter: Payer: Self-pay | Admitting: Physician Assistant

## 2019-07-18 ENCOUNTER — Inpatient Hospital Stay: Payer: Medicare Other

## 2019-07-18 VITALS — BP 109/77 | HR 100 | Temp 98.5°F | Resp 18 | Ht 67.0 in | Wt 199.1 lb

## 2019-07-18 DIAGNOSIS — E785 Hyperlipidemia, unspecified: Secondary | ICD-10-CM | POA: Insufficient documentation

## 2019-07-18 DIAGNOSIS — C3491 Malignant neoplasm of unspecified part of right bronchus or lung: Secondary | ICD-10-CM

## 2019-07-18 DIAGNOSIS — Z5112 Encounter for antineoplastic immunotherapy: Secondary | ICD-10-CM | POA: Diagnosis not present

## 2019-07-18 DIAGNOSIS — Z923 Personal history of irradiation: Secondary | ICD-10-CM | POA: Insufficient documentation

## 2019-07-18 DIAGNOSIS — I10 Essential (primary) hypertension: Secondary | ICD-10-CM | POA: Diagnosis not present

## 2019-07-18 DIAGNOSIS — Z79899 Other long term (current) drug therapy: Secondary | ICD-10-CM | POA: Insufficient documentation

## 2019-07-18 DIAGNOSIS — R197 Diarrhea, unspecified: Secondary | ICD-10-CM | POA: Insufficient documentation

## 2019-07-18 DIAGNOSIS — C3411 Malignant neoplasm of upper lobe, right bronchus or lung: Secondary | ICD-10-CM | POA: Diagnosis not present

## 2019-07-18 DIAGNOSIS — Z8546 Personal history of malignant neoplasm of prostate: Secondary | ICD-10-CM | POA: Diagnosis not present

## 2019-07-18 DIAGNOSIS — Z85850 Personal history of malignant neoplasm of thyroid: Secondary | ICD-10-CM | POA: Insufficient documentation

## 2019-07-18 LAB — CMP (CANCER CENTER ONLY)
ALT: 21 U/L (ref 0–44)
AST: 17 U/L (ref 15–41)
Albumin: 3.6 g/dL (ref 3.5–5.0)
Alkaline Phosphatase: 73 U/L (ref 38–126)
Anion gap: 9 (ref 5–15)
BUN: 11 mg/dL (ref 8–23)
CO2: 24 mmol/L (ref 22–32)
Calcium: 8.7 mg/dL — ABNORMAL LOW (ref 8.9–10.3)
Chloride: 108 mmol/L (ref 98–111)
Creatinine: 0.98 mg/dL (ref 0.61–1.24)
GFR, Est AFR Am: >60 mL/min (ref >60)
GFR, Estimated: >60 mL/min (ref >60)
Glucose, Bld: 140 mg/dL — ABNORMAL HIGH (ref 70–99)
Potassium: 4.1 mmol/L (ref 3.5–5.1)
Sodium: 141 mmol/L (ref 135–145)
Total Bilirubin: 0.4 mg/dL (ref 0.3–1.2)
Total Protein: 6.9 g/dL (ref 6.5–8.1)

## 2019-07-18 LAB — CBC WITH DIFFERENTIAL (CANCER CENTER ONLY)
Abs Immature Granulocytes: 0.02 10*3/uL (ref 0.00–0.07)
Basophils Absolute: 0 10*3/uL (ref 0.0–0.1)
Basophils Relative: 0 %
Eosinophils Absolute: 0.6 10*3/uL — ABNORMAL HIGH (ref 0.0–0.5)
Eosinophils Relative: 11 %
HCT: 42.9 % (ref 39.0–52.0)
Hemoglobin: 13.8 g/dL (ref 13.0–17.0)
Immature Granulocytes: 0 %
Lymphocytes Relative: 17 %
Lymphs Abs: 1 10*3/uL (ref 0.7–4.0)
MCH: 28 pg (ref 26.0–34.0)
MCHC: 32.2 g/dL (ref 30.0–36.0)
MCV: 87 fL (ref 80.0–100.0)
Monocytes Absolute: 0.6 10*3/uL (ref 0.1–1.0)
Monocytes Relative: 11 %
Neutro Abs: 3.4 10*3/uL (ref 1.7–7.7)
Neutrophils Relative %: 61 %
Platelet Count: 198 10*3/uL (ref 150–400)
RBC: 4.93 MIL/uL (ref 4.22–5.81)
RDW: 13.1 % (ref 11.5–15.5)
WBC Count: 5.6 10*3/uL (ref 4.0–10.5)
nRBC: 0 % (ref 0.0–0.2)

## 2019-07-18 LAB — TSH: TSH: 1.921 u[IU]/mL (ref 0.320–4.118)

## 2019-07-18 MED ORDER — SODIUM CHLORIDE 0.9 % IV SOLN
1500.0000 mg | Freq: Once | INTRAVENOUS | Status: AC
  Administered 2019-07-18: 12:00:00 1500 mg via INTRAVENOUS
  Filled 2019-07-18: qty 30

## 2019-07-18 MED ORDER — SODIUM CHLORIDE 0.9 % IV SOLN
Freq: Once | INTRAVENOUS | Status: AC
  Filled 2019-07-18: qty 250

## 2019-07-18 NOTE — Patient Instructions (Signed)
Durvalumab injection What is this medicine? DURVALUMAB (dur VAL ue mab) is a monoclonal antibody. It is used to treat urothelial cancer and lung cancer. This medicine may be used for other purposes; ask your health care provider or pharmacist if you have questions. COMMON BRAND NAME(S): IMFINZI What should I tell my health care provider before I take this medicine? They need to know if you have any of these conditions:  diabetes  immune system problems  infection  inflammatory bowel disease  kidney disease  liver disease  lung or breathing disease  lupus  organ transplant  stomach or intestine problems  thyroid disease  an unusual or allergic reaction to durvalumab, other medicines, foods, dyes, or preservatives  pregnant or trying to get pregnant  breast-feeding How should I use this medicine? This medicine is for infusion into a vein. It is given by a health care professional in a hospital or clinic setting. A special MedGuide will be given to you before each treatment. Be sure to read this information carefully each time. Talk to your pediatrician regarding the use of this medicine in children. Special care may be needed. Overdosage: If you think you have taken too much of this medicine contact a poison control center or emergency room at once. NOTE: This medicine is only for you. Do not share this medicine with others. What if I miss a dose? It is important not to miss your dose. Call your doctor or health care professional if you are unable to keep an appointment. What may interact with this medicine? Interactions have not been studied. This list may not describe all possible interactions. Give your health care provider a list of all the medicines, herbs, non-prescription drugs, or dietary supplements you use. Also tell them if you smoke, drink alcohol, or use illegal drugs. Some items may interact with your medicine. What should I watch for while using this  medicine? This drug may make you feel generally unwell. Continue your course of treatment even though you feel ill unless your doctor tells you to stop. You may need blood work done while you are taking this medicine. Do not become pregnant while taking this medicine or for 3 months after stopping it. Women should inform their doctor if they wish to become pregnant or think they might be pregnant. There is a potential for serious side effects to an unborn child. Talk to your health care professional or pharmacist for more information. Do not breast-feed an infant while taking this medicine or for 3 months after stopping it. What side effects may I notice from receiving this medicine? Side effects that you should report to your doctor or health care professional as soon as possible:  allergic reactions like skin rash, itching or hives, swelling of the face, lips, or tongue  black, tarry stools  bloody or watery diarrhea  breathing problems  change in emotions or moods  change in sex drive  changes in vision  chest pain or chest tightness  chills  confusion  cough  facial flushing  fever  headache  signs and symptoms of high blood sugar such as dizziness; dry mouth; dry skin; fruity breath; nausea; stomach pain; increased hunger or thirst; increased urination  signs and symptoms of liver injury like dark yellow or brown urine; general ill feeling or flu-like symptoms; light-colored stools; loss of appetite; nausea; right upper belly pain; unusually weak or tired; yellowing of the eyes or skin  stomach pain  trouble passing urine or change in  the amount of urine  weight gain or weight loss Side effects that usually do not require medical attention (report these to your doctor or health care professional if they continue or are bothersome):  bone pain  constipation  loss of appetite  muscle pain  nausea  swelling of the ankles, feet, hands  tiredness This list  may not describe all possible side effects. Call your doctor for medical advice about side effects. You may report side effects to FDA at 1-800-FDA-1088. Where should I keep my medicine? This drug is given in a hospital or clinic and will not be stored at home. NOTE: This sheet is a summary. It may not cover all possible information. If you have questions about this medicine, talk to your doctor, pharmacist, or health care provider.  2020 Elsevier/Gold Standard (2016-07-20 19:25:04)

## 2019-07-19 ENCOUNTER — Telehealth: Payer: Self-pay | Admitting: Physician Assistant

## 2019-07-19 NOTE — Telephone Encounter (Signed)
Scheduled per los. Called and spoke with patient. Confirmed appt 

## 2019-07-30 ENCOUNTER — Other Ambulatory Visit: Payer: Self-pay | Admitting: Family Medicine

## 2019-08-03 ENCOUNTER — Encounter: Payer: Self-pay | Admitting: Internal Medicine

## 2019-08-03 DIAGNOSIS — D440 Neoplasm of uncertain behavior of thyroid gland: Secondary | ICD-10-CM | POA: Diagnosis not present

## 2019-08-07 ENCOUNTER — Ambulatory Visit: Payer: Medicare Other | Admitting: Internal Medicine

## 2019-08-07 ENCOUNTER — Other Ambulatory Visit: Payer: Self-pay

## 2019-08-07 ENCOUNTER — Encounter: Payer: Self-pay | Admitting: Internal Medicine

## 2019-08-07 VITALS — BP 128/88 | HR 87 | Ht 67.0 in | Wt 199.0 lb

## 2019-08-07 DIAGNOSIS — C73 Malignant neoplasm of thyroid gland: Secondary | ICD-10-CM

## 2019-08-07 DIAGNOSIS — E890 Postprocedural hypothyroidism: Secondary | ICD-10-CM

## 2019-08-07 NOTE — Patient Instructions (Signed)
Please stop at the lab.  Please continue Levothyroxine 100 mcg daily.  Take the thyroid hormone every day, with water, at least 30 minutes before breakfast, separated by at least 4 hours from: - acid reflux medications - calcium - iron - multivitamins  Please call [facility] department if you are not called by them in next 3 days.  Please come back for a follow-up appointment in 4 months.   Radioactive Iodine Treatment for Thyroid Cancer  Radioactive iodine treatment is a treatment for thyroid cancer. The treatment involves swallowing a pill or liquid that contains a substance called radioactive iodine (I-131), or radioiodine. The substance is absorbed by the thyroid gland and destroys cancerous tissue. This treatment may be used to kill cancer cells that remain after surgery to remove the cancer. It may also be done for thyroid cancer that has spread or has come back after treatments. Tell a health care provider about:  Any allergies you have.  All medicines you are taking, including vitamins, herbs, eye drops, creams, and over-the-counter medicines.  Any surgeries you have had.  Any medical conditions you have.  Any blood disorders you have.  Whether you are pregnant, may be pregnant, or have gone through menopause, if this applies.  Whether you are breastfeeding, if this applies.  Any contact you have with children or pregnant women.  Your travel plans for the next 3 months.  Whether you pass through radiation detectors for work or travel. What are the risks? Generally, this is a safe procedure. However, problems may occur, including:  Damage to other structures or organs, such as the salivary glands. This could lead to dry mouth and loss of taste.  Increased risk for leukemia or other cancers.  Lower sperm count or infertility in men.  Irregular menstrual periods in women. What happens before the procedure? Eating and drinking restrictions  Follow instructions from  your health care provider about eating or drinking restrictions.  Follow a low-iodine diet as told by your health care provider. Check ingredients on packaged foods and beverages because there are foods that you will need to avoid while on the low-iodine diet: ? Avoid iodized table salt and foods that have iodized salt. ? Avoid seafood, seaweed, soybeans, and soy products. ? Avoid dairy products and eggs. ? Avoid the food dye Red No. 3 because it has iodine. Tests and exams See your health care provider for any needed tests and exams. Before your treatment:  You will have tests to check your thyroid hormone levels.  You will take a small dose of radioactive iodine and have a body scan. The dose will be much smaller than your normal treatment dose. The body scan will show if your body is absorbing the iodine.  You may get injections of thyroid-stimulating hormone to help your body absorb iodine. General instructions  Ask your health care provider about changing or stopping your regular medicines. You may have to stop taking your thyroid hormone medicine or other medicines.  Plan to drive yourself home after treatment with no one else in the car. Do not take public transportation. If you need someone to drive you home, sit as far away from the driver as possible.  If you are breastfeeding, ask your health care provider when to stop. Radioactive iodine can pass into your milk. What happens during the procedure?  You will have a body scan to check for cancerous cells in your thyroid.  You may be given medicine to prevent nausea or vomiting.  You will go to an isolated room with lead-lined walls. The room will protect others from the radioactive treatment that you will receive.  You will take the radioactive iodine in liquid or pill form. You may have water to swallow the pills.  You will have to stay in the isolated room with lead-lined walls for at least 2 hours. What happens after the  procedure?  You will have a body scan to check if the radioactive iodine is being absorbed by your thyroid.  Do not eat or drink for 1-2 hours after the procedure as told by your health care provider.  Your health care provider will discuss with you whether you need to stay in the hospital a few days.  Follow instructions from your health care provider about avoiding contact with other people to protect them from exposure to radiation. Summary  Radioactive iodine treatment is a treatment for thyroid cancer.  You will have to stay in the isolated room with lead-lined walls for at least 2 hours after taking the radioactive iodine.  Your health care provider will discuss with you whether you need to stay in the hospital a few days.  Do not eat or drink for 1-2 hours after the procedure as told by your health care provider.  Plan to drive yourself home after treatment with no one else in the car. Do not take public transportation. If you need someone to drive you home, sit as far away from the driver as possible. Follow instructions from your health care provider about avoiding contact with other people after the treatment. This information is not intended to replace advice given to you by your health care provider. Make sure you discuss any questions you have with your health care provider. Document Revised: 11/13/2018 Document Reviewed: 11/13/2018 Elsevier Patient Education  [place].   Radioactive Iodine Treatment for Thyroid Cancer, Care After This sheet gives you information about how to care for yourself after your procedure. Your health care provider may also give you more specific instructions. If you have problems or questions, contact your health care provider. What can I expect after the procedure? After the procedure, your body and all of your body fluids-such as sweat, urine, stool, and saliva-will be radioactive. It is common to have:  Pain and swelling in the glands  that make saliva (salivary glands).  Dry mouth and less saliva.  Changes in taste.  Pain and swelling in the mouth and cheeks.  Mild nausea.  Swelling or pain in the neck. Follow these instructions at home: Eating and drinking   Do not eat or drink for 1-2 hours after the procedure.  Do notshare food or drinks with other people for as long as told by your health care provider.  Do not share utensils-such as silverware, plates, or cups-for as long as told by your health care provider. Use disposable utensils, or clean your utensils separately from those of others.  Chew gum or suck on hard candy if your mouth is dry.  Follow the diet that your health care provider recommends.  Drink enough fluid to keep your urine pale yellow. Lifestyle Avoid close contact with other people for as long as told by your health care provider. The radiation in your body can affect others. Avoiding contact with children and pregnant women is especially important because they are more sensitive to radiation. For as long as told by your health care provider:  Sleep alone. Sleep in a separate bed if you normally  share your bed with another person.  Flush twice after using the toilet. Clean up any spills of urine or stool in the bathroom right away. ? Avoid using public bathrooms.  Wash your sheets, towels, and clothes separately from other people's items.  Do not have close intimate contact of any kind. This includes kissing, physical contact, and sexual intercourse.  Stay home from work or school as told by your health care provider.  Avoid using public transportation or riding in cars with other people. Pregnancy and breastfeeding  Do not try to become pregnant for as long as you are told by your health care provider. This may be for up to 1 year after your procedure. Use a method of birth control (contraception) to prevent pregnancy. Talk to your health care provider about what form of  contraception is right for you.  Do not breastfeed for as long as you are told by your health care provider, if this applies. General instructions  Take over-the-counter and prescription medicines only as told by your health care provider.  Keep all follow-up visits as told by your health care provider. This is important. Contact a health care provider if:  You have severe nausea and vomiting.  You are having problems eating or drinking.  You are constipated or have diarrhea. Get help right away if:  You have trouble breathing.  Your salivary glands are very painful and swollen.  Your saliva smells or tastes bad. Summary  After the procedure, it is common to have a dry mouth, mild nausea, and some pain and swelling in the salivary glands, the neck, or the mouth and cheeks.  Do notshare your utensils, food, or drinks with other people for as long as told by your health care provider.  Avoid being around other people for as long as told by your health care provider. The radiation in your body can affect others. Avoiding contact with children and pregnant women is especially important.  Drink enough fluid to keep your urine pale yellow. This information is not intended to replace advice given to you by your health care provider. Make sure you discuss any questions you have with your health care provider. Document Revised: 11/13/2018 Document Reviewed: 11/13/2018 Elsevier Patient Education  [place].

## 2019-08-07 NOTE — Progress Notes (Signed)
Patient ID: Carl Harrell, male   DOB: [birth year], 74 y.o.   MRN: [id number]   This visit occurred during the SARS-CoV-2 public health emergency.  Safety protocols were in place, including screening questions prior to the visit, additional usage of staff PPE, and extensive cleaning of exam room while observing appropriate contact time as indicated for disinfecting solutions.   HPI  Carl Harrell is a 74 y.o.-year-old male, referred by Dr. Harlow Asa, for management of thyroid cancer and postsurgical hypothyroidism.  Pt. has been dx with follicular variant of papillary thyroid cancer in 03/2019 during investigation for his stage III lung cancer (s/p RxTx and ChTx).   Reviewed his thyroid cancer history: PET/CT (02/23/2019): Increased uptake in the left inferior thyroid Thyroid U/S (03/16/2019): 3 x 2.3 x 2.4 cm isoechoic thyroid nodule with extrathyroidal extension FNA of the nodule (04/05/2019): Follicular neoplasm Total thyroidectomy (06/25/2019): Left mid and low lobe 3.2 cm carcinoma, follicular variant of PTC with capsular invasion, clear margins, and without lymphovascular vascular extension.  No lymph nodes analyzed. A. THYROID, TOTAL THYROIDECTOMY:  - Papillary thyroid carcinoma, follicular variant, 3.2 cm, with capsular invasion  - Resection margins are negative for carcinoma  - No evidence of extrathyroidal extension  - No evidence of lymphovascular invasion  - Scattered nonnecrotizing granulomas in addition thyroid parenchyma   Nonnecrotizing, well-formed granulomas are most suggestive of  sarcoidosis. Clinical correlation is suggested. Dr. Harlow Asa was paged  on 06/28/2019.   Procedure: Total thyroidectomy  Tumor Focality: Unifocal  Tumor Site: Left mid and lower lobe  Tumor Size: 3.2 cm  Histologic Type: Follicular variant of papillary thyroid carcinoma with capsular invasion  Margins: Uninvolved by tumor  Angioinvasion: Not identified  Lymphatic Invasion: Not identified   Extrathyroidal extension: Not identified  Regional Lymph Nodes: No lymph nodes submitted or found  Pathologic Stage Classification (pTNM, AJCC 8th Edition): pT2, pNx  Comment(s): Nonnecrotizing granulomas   Pt denies: - feeling nodules in neck - hoarseness - dysphagia - choking - SOB with lying down  Postsurgical hypothyroidism:  He is on Levothyroxine 100 mcg, taken: - fasting (4 am) - with water - separated by 4h from b'fast  - no calcium, iron, PPIs -  multivitamins in am (10 am), but occasionally taking them together  I reviewed pt's thyroid tests: Lab Results  Component Value Date   TSH 1.921 07/18/2019   TSH 1.334 06/20/2019   TSH 1.53 03/14/2014   TSH 1.58 03/13/2013   TSH 1.44 04/12/2012   TSH 1.35 08/18/2010   TSH 1.16 09/25/2008   TSH 1.00 09/13/2007    He did not have postsurgical hypocalcemia.  Postop calcium level was 9.0 (07/17/2019).  Pt denies: - fatigue - weight gain - cold intolerance - constipation - dry skin - hair loss - depression  She has no FH of thyroid disorders. No FH of thyroid cancer.  No h/o radiation tx to head or neck.  He takes B12, but not biotin.  ROS: Constitutional: + Both weight gain/loss, no fatigue, no subjective hyperthermia/hypothermia, + nocturia Eyes: no blurry vision, no xerophthalmia ENT: no sore throat, no nodules felt in neck, no dysphagia/odynophagia, no hoarseness Cardiovascular: no CP/SOB/palpitations/leg swelling Respiratory: + Cough/no SOB Gastrointestinal: no N/V/+ D/+ C Musculoskeletal: no muscle/joint aches Skin: no rashes, + itching Neurological: no tremors/numbness/tingling/dizziness Psychiatric: no depression/anxiety  Past Medical History:  Diagnosis Date  . ALLERGIC RHINITIS 01/30/2007  . Arthritis    "hands" (05/29/2014)  . Cancer ([place])    lung, prostate  . Constipation   .  Coronary artery disease    LHC (05/29/14): pLAD 50-70, pRCA 99 (L>R collats), EF 55% >> PCI: 3.5 x 28 mm Xience [employer]  DES to [employer]  . Cough secondary to angiotensin converting enzyme inhibitor (ACE-I) 05/28/2013  . ED (erectile dysfunction)   . GERD 09/26/2007  . HYPERLIPIDEMIA 09/26/2007  . Hypertension   . Lung cancer ([place]) dx'd 03/2018  . PLANTAR FASCIITIS, BILATERAL 07/08/2009  . Pre-diabetes   . Prostate CA ([place]) dx'd 2017 or 2018   xrt  . Unspecified hearing loss 06/16/2009   wears aides both ears   Past Surgical History:  Procedure Laterality Date  . CARDIAC CATHETERIZATION  05/29/2014   Procedure: CORONARY STENT INTERVENTION;  Surgeon: Sinclair Grooms, MD;  Location[facility] C[facility] LAB;  Service: Cardiovascular;;  Prox RCA  . COLONOSCOPY    . CORONARY ANGIOPLASTY WITH STENT PLACEMENT  05/29/2014   "1"  . KNEE ARTHROSCOPY Right ~ 2005  . LEFT HEART CATHETERIZATION WITH CORONARY ANGIOGRAM N/A 05/29/2014   Procedure: LEFT HEART CATHETERIZATION WITH CORONARY ANGIOGRAM;  Surgeon: Sinclair Grooms, MD;  Location: [facility] CATH LAB;  Service: Cardiovascular;  Laterality: N/A;  . THYROIDECTOMY N/A 06/25/2019   Procedure: TOTAL THYROIDECTOMY;  Surgeon: Armandina Gemma, MD;  Location: [facility] ORS;  Service: General;  Laterality: N/A;  . VIDEO BRONCHOSCOPY WITH ENDOBRONCHIAL ULTRASOUND N/A 02/20/2019   Procedure: VIDEO BRONCHOSCOPY WITH ENDOBRONCHIAL ULTRASOUND;  Surgeon: Garner Nash, DO;  Location: [facility];  Service: Thoracic;  Laterality: N/A;   Social History   Socioeconomic History  . Marital status: Married    Spouse name: Not on file  . Number of children: Not on file  . Years of education: Not on file  . Highest education level: Not on file  Occupational History  . Not on file  Tobacco Use  . Smoking status: Former Smoker    Packs/day: 0.20    Years: 15.00    Pack years: 3.00    Types: Cigarettes    Quit date: 05/24/1978    Years since quitting: 41.2  . Smokeless tobacco: Never Used  . Tobacco comment: 05/29/2014 "smoked socially; nothing since 1980"  Substance and Sexual Activity  . Alcohol use: Yes     Alcohol/week: 2.0 standard drinks    Types: 2 Shots of liquor per week    Comment: beer or two a week  . Drug use: No  . Sexual activity: Not Currently  Other Topics Concern  . Not on file  Social History Narrative   Lives with wife in a one story home.  Has 2 children.  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  Education: 3 years of college.    Does wood working.    Social Determinants of Health   Financial Resource Strain:   . Difficulty of Paying Living Expenses:   Food Insecurity:   . Worried About Charity fundraiser in the Last Year:   . Arboriculturist in the Last Year:   Transportation Needs:   . Film/video editor (Medical):   Marland Kitchen Lack of Transportation (Non-Medical):   Physical Activity:   . Days of Exercise per Week:   . Minutes of Exercise per Session:   Stress:   . Feeling of Stress :   Social Connections:   . Frequency of Communication with Friends and Family:   . Frequency of Social Gatherings with Friends and Family:   . Attends Religious Services:   . Active Member of Clubs or Organizations:   . Attends Club  or Organization Meetings:   Marland Kitchen Marital Status:   Intimate Partner Violence:   . Fear of Current or Ex-Partner:   . Emotionally Abused:   Marland Kitchen Physically Abused:   . Sexually Abused:    Current Outpatient Medications on File Prior to Visit  Medication Sig Dispense Refill  . benzonatate (TESSALON) 100 MG capsule Take 100 mg by mouth daily as needed for cough.     . cetirizine (ZYRTEC) 10 MG tablet Take 1 tablet (10 mg total) by mouth daily. 30 tablet 11  . Cyanocobalamin (VITAMIN B-12 CR PO) Take 50 mcg by mouth daily.     . diphenhydrAMINE (ALLERGY) 25 MG tablet Take 25 mg by mouth daily.    Marland Kitchen ezetimibe (ZETIA) 10 MG tablet TAKE 1 TABLET BY MOUTH EVERY DAY (Patient taking differently: Take 10 mg by mouth at bedtime. ) 90 tablet 3  . fluticasone (FLONASE) 50 MCG/ACT nasal spray SHAKE LIQUID AND USE 1 SPRAY IN EACH NOSTRIL DAILY (Patient taking differently: Place 1 spray  into both nostrils daily. ) 48 g 0  . guaifenesin (TUSSIN) 100 MG/5ML syrup Take 400 mg by mouth 2 (two) times daily.    . hydrochlorothiazide (MICROZIDE) 12.5 MG capsule Take 1 capsule (12.5 mg total) by mouth daily. 90 capsule 3  . levothyroxine (SYNTHROID) 100 MCG tablet Take 1 tablet (100 mcg total) by mouth daily. 30 tablet 3  . losartan (COZAAR) 50 MG tablet TAKE 1 TABLET BY MOUTH EVERY DAY 90 tablet 0  . metoprolol tartrate (LOPRESSOR) 25 MG tablet Take 1 tablet (25 mg total) by mouth 2 (two) times daily. 180 tablet 0  . Multiple Vitamin (MULTIVITAMIN WITH MINERALS) TABS tablet Take 1 tablet by mouth daily.    Marland Kitchen omeprazole (PRILOSEC) 40 MG capsule TAKE 1 CAPSULE(40 MG) BY MOUTH DAILY 90 capsule 1  . simvastatin (ZOCOR) 40 MG tablet TAKE 1 TABLET(40 MG) BY MOUTH DAILY AT 6 PM (Patient taking differently: Take 40 mg by mouth daily at 6 PM. ) 90 tablet 3  . tamsulosin (FLOMAX) 0.4 MG CAPS capsule Take 1 capsule (0.4 mg total) by mouth daily. 30 capsule 5  . traMADol (ULTRAM) 50 MG tablet Take 1-2 tablets (50-100 mg total) by mouth every 6 (six) hours as needed. 15 tablet 0  . calcium carbonate (TUMS) 500 MG chewable tablet Chew 2 tablets (400 mg of elemental calcium total) by mouth 2 (two) times daily. (Patient not taking: Reported on 07/18/2019) 90 tablet 1   No current facility-administered medications on file prior to visit.   Allergies  Allergen Reactions  . Lisinopril Cough   Family History  Problem Relation Age of Onset  . Heart attack Mother   . Heart disease Mother   . Heart attack Father   . Heart disease Father   . COPD Neg Hx        family hx  . Hyperlipidemia Neg Hx        family hx    PE: BP 128/88   Pulse 87   Ht _0  (1.702 m)   Wt 199 lb (90.3 kg)   SpO2 97%   BMI 31.17 kg/m  Wt Readings from Last 3 Encounters:  08/07/19 199 lb (90.3 kg)  07/18/19 199 lb 1.6 oz (90.3 kg)  06/25/19 190 lb 0.6 oz (86.2 kg)   Constitutional: overweight, in NAD Eyes:  PERRLA, EOMI, no exophthalmos ENT: moist mucous membranes, no neck masses palpated, no cervical lymphadenopathy Cardiovascular: RRR, No MRG Respiratory: CTA B Gastrointestinal: abdomen  soft, NT, ND, BS+ Musculoskeletal: no deformities, strength intact in all 4 Skin: moist, warm, no rashes Neurological: no tremor with outstretched hands, DTR normal in all 4  ASSESSMENT: 1. Thyroid cancer - see HPI  2. Postsurgical Hypothyroidism  PLAN:  1. Thyroid cancer - papillary - I had a long discussion with the patient about his recent diagnosis of thyroid cancer. We reviewed together the pathology >> he is stage [id number] -Of note, there were changes on the thyroid pathology consistent with sarcoidosis, but he does not have a previous diagnosis of this condition.  Will forward the note to patient's PCP. - I reassured him that papillary thyroid cancer is a slow growing cancer with good prognosis; his life expectancy or quality of life is unlikely to be reduced due to the cancer.  In his particular case, he has a follicular variant of papillary thyroid cancer, which is lower risk especially if there is no capsular or vascular invasion.  However, his tumor did have capsular invasion.  Based on this and also based on his age and the size of his tumor, I would recommend RAI treatment for him for remnant ablation. I explained that the main role of this is to facilitate monitoring in the long run (by checking thyroglobulin). I explained what this entails.  We will do this with Thyrogen stimulation, rather than hormonal withdrawal.  I explained that a whole-body scan will be done afterwards to check for any metastasis or local extension.  Pt agrees to have these tests scheduled.  - At next visit, we will check a thyroglobulin and ATA antibodies.  This will serve as a baseline.  We discussed that the trend, rather than the absolute value is valuable when we follow the thyroglobulin. - I will then see the patient in  approximately 4 months  2.  Patient with h/o total thyroidectomy for cancer, now with iatrogenic hypothyroidism, on levothyroxine therapy.  He is taking 100 mcg daily.  On this dose, latest TFTs were normal, however, these were done approximately 3 weeks after the surgery.  We discussed about repeating them today. - he appears euthyroid.  Also, he does not appear to have neck masses, enlarged cervical lymph nodes, or neck compression symptoms - We discussed about correct intake of levothyroxine, fasting, with water, separated by at least 30 minutes from breakfast, and separated by more than 4 hours from calcium, iron, multivitamins, acid reflux medications (PPIs).  Pt. is taking it correctly. - will check thyroid tests today: TSH, free T4  - If these are abnormal, he will need to return in 5-6 weeks for repeat labs - If these are normal, we will recheck them at next visit  Orders Placed This Encounter  Procedures  . NM RAI Thyroid Ca W/ Thyrogen  . NM Whole Body I131 Scan S/P Ca Rx  . TSH  . T4, free   Actually, the TSH and the free T4 levels were not drawn since pt. just had labs at [place] on 08/03/2019 >> we obtained the records: TSH 2.91.  We can continue the same dose of levothyroxine replacement.  Philemon Kingdom, MD PhD [facility]

## 2019-08-10 ENCOUNTER — Other Ambulatory Visit: Payer: Self-pay

## 2019-08-10 ENCOUNTER — Ambulatory Visit (HOSPITAL_COMMUNITY)
Admission: RE | Admit: 2019-08-10 | Discharge: 2019-08-10 | Disposition: A | Discharge status: Home / Self Care | Payer: Medicare Other | Source: Ambulatory Visit | Attending: Physician Assistant | Admitting: Physician Assistant

## 2019-08-10 DIAGNOSIS — C3491 Malignant neoplasm of unspecified part of right bronchus or lung: Secondary | ICD-10-CM | POA: Diagnosis not present

## 2019-08-10 DIAGNOSIS — C3490 Malignant neoplasm of unspecified part of unspecified bronchus or lung: Secondary | ICD-10-CM | POA: Diagnosis not present

## 2019-08-10 MED ORDER — SODIUM CHLORIDE (PF) 0.9 % IJ SOLN
INTRAMUSCULAR | Status: AC
  Filled 2019-08-10: qty 50

## 2019-08-10 MED ORDER — IOHEXOL 300 MG/ML  SOLN
75.0000 mL | Freq: Once | INTRAMUSCULAR | Status: AC | PRN
  Administered 2019-08-10: 75 mL via INTRAVENOUS

## 2019-08-13 ENCOUNTER — Other Ambulatory Visit: Payer: Self-pay

## 2019-08-13 ENCOUNTER — Inpatient Hospital Stay: Payer: Medicare Other | Attending: Internal Medicine

## 2019-08-13 DIAGNOSIS — C3411 Malignant neoplasm of upper lobe, right bronchus or lung: Secondary | ICD-10-CM | POA: Insufficient documentation

## 2019-08-13 DIAGNOSIS — C3491 Malignant neoplasm of unspecified part of right bronchus or lung: Secondary | ICD-10-CM

## 2019-08-13 DIAGNOSIS — E785 Hyperlipidemia, unspecified: Secondary | ICD-10-CM | POA: Insufficient documentation

## 2019-08-13 DIAGNOSIS — Z79899 Other long term (current) drug therapy: Secondary | ICD-10-CM | POA: Insufficient documentation

## 2019-08-13 DIAGNOSIS — Z8546 Personal history of malignant neoplasm of prostate: Secondary | ICD-10-CM | POA: Diagnosis not present

## 2019-08-13 DIAGNOSIS — R05 Cough: Secondary | ICD-10-CM | POA: Diagnosis not present

## 2019-08-13 DIAGNOSIS — Z85850 Personal history of malignant neoplasm of thyroid: Secondary | ICD-10-CM | POA: Insufficient documentation

## 2019-08-13 DIAGNOSIS — I2510 Atherosclerotic heart disease of native coronary artery without angina pectoris: Secondary | ICD-10-CM | POA: Diagnosis not present

## 2019-08-13 DIAGNOSIS — Z923 Personal history of irradiation: Secondary | ICD-10-CM | POA: Insufficient documentation

## 2019-08-13 DIAGNOSIS — Z5112 Encounter for antineoplastic immunotherapy: Secondary | ICD-10-CM | POA: Diagnosis not present

## 2019-08-13 DIAGNOSIS — J439 Emphysema, unspecified: Secondary | ICD-10-CM | POA: Diagnosis not present

## 2019-08-13 DIAGNOSIS — I10 Essential (primary) hypertension: Secondary | ICD-10-CM | POA: Insufficient documentation

## 2019-08-13 LAB — CMP (CANCER CENTER ONLY)
ALT: 24 U/L (ref 0–44)
AST: 17 U/L (ref 15–41)
Albumin: 3.5 g/dL (ref 3.5–5.0)
Alkaline Phosphatase: 79 U/L (ref 38–126)
Anion gap: 10 (ref 5–15)
BUN: 11 mg/dL (ref 8–23)
CO2: 24 mmol/L (ref 22–32)
Calcium: 8.7 mg/dL — ABNORMAL LOW (ref 8.9–10.3)
Chloride: 107 mmol/L (ref 98–111)
Creatinine: 1.1 mg/dL (ref 0.61–1.24)
GFR, Est AFR Am: >60 mL/min (ref >60)
GFR, Estimated: >60 mL/min (ref >60)
Glucose, Bld: 119 mg/dL — ABNORMAL HIGH (ref 70–99)
Potassium: 4.2 mmol/L (ref 3.5–5.1)
Sodium: 141 mmol/L (ref 135–145)
Total Bilirubin: 0.3 mg/dL (ref 0.3–1.2)
Total Protein: 6.8 g/dL (ref 6.5–8.1)

## 2019-08-13 LAB — CBC WITH DIFFERENTIAL (CANCER CENTER ONLY)
Abs Immature Granulocytes: 0.1 10*3/uL — ABNORMAL HIGH (ref 0.00–0.07)
Basophils Absolute: 0 10*3/uL (ref 0.0–0.1)
Basophils Relative: 0 %
Eosinophils Absolute: 0.6 10*3/uL — ABNORMAL HIGH (ref 0.0–0.5)
Eosinophils Relative: 9 %
HCT: 45.7 % (ref 39.0–52.0)
Hemoglobin: 14.7 g/dL (ref 13.0–17.0)
Lymphocytes Relative: 20 %
Lymphs Abs: 1.3 10*3/uL (ref 0.7–4.0)
MCH: 28.1 pg (ref 26.0–34.0)
MCHC: 32.2 g/dL (ref 30.0–36.0)
MCV: 87.4 fL (ref 80.0–100.0)
Metamyelocytes Relative: 1 %
Monocytes Absolute: 0.8 10*3/uL (ref 0.1–1.0)
Monocytes Relative: 12 %
Neutro Abs: 3.7 10*3/uL (ref 1.7–7.7)
Neutrophils Relative %: 58 %
Platelet Count: 222 10*3/uL (ref 150–400)
RBC: 5.23 MIL/uL (ref 4.22–5.81)
RDW: 13.1 % (ref 11.5–15.5)
WBC Count: 6.4 10*3/uL (ref 4.0–10.5)
nRBC: 0 % (ref 0.0–0.2)

## 2019-08-13 LAB — TSH: TSH: 4.774 u[IU]/mL — ABNORMAL HIGH (ref 0.320–4.118)

## 2019-08-15 ENCOUNTER — Other Ambulatory Visit: Payer: Self-pay

## 2019-08-15 ENCOUNTER — Encounter: Payer: Self-pay | Admitting: Internal Medicine

## 2019-08-15 ENCOUNTER — Other Ambulatory Visit: Payer: Medicare Other

## 2019-08-15 ENCOUNTER — Inpatient Hospital Stay: Payer: Medicare Other

## 2019-08-15 ENCOUNTER — Other Ambulatory Visit: Payer: Self-pay | Admitting: Internal Medicine

## 2019-08-15 ENCOUNTER — Inpatient Hospital Stay (HOSPITAL_BASED_OUTPATIENT_CLINIC_OR_DEPARTMENT_OTHER): Payer: Medicare Other | Admitting: Internal Medicine

## 2019-08-15 VITALS — BP 131/81 | HR 93 | Temp 97.8°F | Resp 19 | Ht 67.0 in | Wt 198.5 lb

## 2019-08-15 DIAGNOSIS — I2510 Atherosclerotic heart disease of native coronary artery without angina pectoris: Secondary | ICD-10-CM | POA: Diagnosis not present

## 2019-08-15 DIAGNOSIS — Z5112 Encounter for antineoplastic immunotherapy: Secondary | ICD-10-CM | POA: Diagnosis not present

## 2019-08-15 DIAGNOSIS — J439 Emphysema, unspecified: Secondary | ICD-10-CM | POA: Diagnosis not present

## 2019-08-15 DIAGNOSIS — C3411 Malignant neoplasm of upper lobe, right bronchus or lung: Secondary | ICD-10-CM | POA: Diagnosis not present

## 2019-08-15 DIAGNOSIS — Z85850 Personal history of malignant neoplasm of thyroid: Secondary | ICD-10-CM | POA: Diagnosis not present

## 2019-08-15 DIAGNOSIS — C3491 Malignant neoplasm of unspecified part of right bronchus or lung: Secondary | ICD-10-CM

## 2019-08-15 DIAGNOSIS — Z79899 Other long term (current) drug therapy: Secondary | ICD-10-CM | POA: Diagnosis not present

## 2019-08-15 DIAGNOSIS — I10 Essential (primary) hypertension: Secondary | ICD-10-CM | POA: Diagnosis not present

## 2019-08-15 DIAGNOSIS — R05 Cough: Secondary | ICD-10-CM | POA: Diagnosis not present

## 2019-08-15 DIAGNOSIS — Z923 Personal history of irradiation: Secondary | ICD-10-CM | POA: Diagnosis not present

## 2019-08-15 DIAGNOSIS — E785 Hyperlipidemia, unspecified: Secondary | ICD-10-CM | POA: Diagnosis not present

## 2019-08-15 MED ORDER — SODIUM CHLORIDE 0.9 % IV SOLN
Freq: Once | INTRAVENOUS | Status: AC
  Filled 2019-08-15: qty 250

## 2019-08-15 MED ORDER — SODIUM CHLORIDE 0.9 % IV SOLN
1500.0000 mg | Freq: Once | INTRAVENOUS | Status: AC
  Administered 2019-08-15: 1500 mg via INTRAVENOUS
  Filled 2019-08-15: qty 30

## 2019-08-15 NOTE — Patient Instructions (Addendum)
[facility] Discharge Instructions for Patients Receiving Chemotherapy  Today you received the following agents: Imfinzi  To help prevent nausea and vomiting after your treatment, we encourage you to take your nausea medication as prescribed.   If you develop nausea and vomiting that is not controlled by your nausea medication, call the clinic.   BELOW ARE SYMPTOMS THAT SHOULD BE REPORTED IMMEDIATELY:  *FEVER GREATER THAN 100.5 F  *CHILLS WITH OR WITHOUT FEVER  NAUSEA AND VOMITING THAT IS NOT CONTROLLED WITH YOUR NAUSEA MEDICATION  *UNUSUAL SHORTNESS OF BREATH  *UNUSUAL BRUISING OR BLEEDING  TENDERNESS IN MOUTH AND THROAT WITH OR WITHOUT PRESENCE OF ULCERS  *URINARY PROBLEMS  *BOWEL PROBLEMS  UNUSUAL RASH Items with * indicate a potential emergency and should be followed up as soon as possible.  Feel free to call the clinic should you have any questions or concerns. The clinic phone number is [phone].  Please show the [place] at check-in to the Emergency Department and triage nurse. Durvalumab injection What is this medicine? DURVALUMAB (dur VAL ue mab) is a monoclonal antibody. It is used to treat urothelial cancer and lung cancer. This medicine may be used for other purposes; ask your health care provider or pharmacist if you have questions. COMMON BRAND NAME(S): IMFINZI What should I tell my health care provider before I take this medicine? They need to know if you have any of these conditions: diabetes immune system problems infection inflammatory bowel disease kidney disease liver disease lung or breathing disease lupus organ transplant stomach or intestine problems thyroid disease an unusual or allergic reaction to durvalumab, other medicines, foods, dyes, or preservatives pregnant or trying to get pregnant breast-feeding How should I use this medicine? This medicine is for infusion into a vein. It is given by a health care  professional in a hospital or clinic setting. A special MedGuide will be given to you before each treatment. Be sure to read this information carefully each time. Talk to your pediatrician regarding the use of this medicine in children. Special care may be needed. Overdosage: If you think you have taken too much of this medicine contact a poison control center or emergency room at once. NOTE: This medicine is only for you. Do not share this medicine with others. What if I miss a dose? It is important not to miss your dose. Call your doctor or health care professional if you are unable to keep an appointment. What may interact with this medicine? Interactions have not been studied. This list may not describe all possible interactions. Give your health care provider a list of all the medicines, herbs, non-prescription drugs, or dietary supplements you use. Also tell them if you smoke, drink alcohol, or use illegal drugs. Some items may interact with your medicine. What should I watch for while using this medicine? This drug may make you feel generally unwell. Continue your course of treatment even though you feel ill unless your doctor tells you to stop. You may need blood work done while you are taking this medicine. Do not become pregnant while taking this medicine or for 3 months after stopping it. Women should inform their doctor if they wish to become pregnant or think they might be pregnant. There is a potential for serious side effects to an unborn child. Talk to your health care professional or pharmacist for more information. Do not breast-feed an infant while taking this medicine or for 3 months after stopping it. What side effects  may I notice from receiving this medicine? Side effects that you should report to your doctor or health care professional as soon as possible: allergic reactions like skin rash, itching or hives, swelling of the face, lips, or tongue black, tarry stools bloody or  watery diarrhea breathing problems change in emotions or moods change in sex drive changes in vision chest pain or chest tightness chills confusion cough facial flushing fever headache signs and symptoms of high blood sugar such as dizziness; dry mouth; dry skin; fruity breath; nausea; stomach pain; increased hunger or thirst; increased urination signs and symptoms of liver injury like dark yellow or brown urine; general ill feeling or flu-like symptoms; light-colored stools; loss of appetite; nausea; right upper belly pain; unusually weak or tired; yellowing of the eyes or skin stomach pain trouble passing urine or change in the amount of urine weight gain or weight loss Side effects that usually do not require medical attention (report these to your doctor or health care professional if they continue or are bothersome): bone pain constipation loss of appetite muscle pain nausea swelling of the ankles, feet, hands tiredness This list may not describe all possible side effects. Call your doctor for medical advice about side effects. You may report side effects to FDA at 1-800-FDA-1088. Where should I keep my medicine? This drug is given in a hospital or clinic and will not be stored at home. NOTE: This sheet is a summary. It may not cover all possible information. If you have questions about this medicine, talk to your doctor, pharmacist, or health care provider.  2020 Elsevier/Gold Standard (2016-07-20 19:25:04)

## 2019-08-15 NOTE — Progress Notes (Signed)
[place] Telephone:([phone]) [phone]   Fax:(336) [phone]  OFFICE PROGRESS NOTE  Carl Ruddy, MD [address] [place] [address]  DIAGNOSIS:  1) Stage IIIA (T2b, N2, M0) non-small cell lung cancer likely adenocarcinoma pending final tissue diagnosis presented with right upper lobe lung mass in addition to right hilar and mediastinal lymphadenopathy diagnosed in September 2020 2) history of prostate adenocarcinoma status post radiotherapy under the care of Dr. Tammi Harrell. 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  PRIOR THERAPY: Concurrent chemoradiation with weekly carboplatin for AUC of 2 and paclitaxel 45 mg/M2.  First dose March 05, 2019.  Status post 7 cycles.  Last dose was given April 16, 2019 with partial response.  CURRENT THERAPY: Consolidation treatment with immunotherapy with Imfinzi 1500 mg IV every 4 weeks.  First dose May 22, 2019.  Status post 3 cycles.  INTERVAL HISTORY: Carl Harrell 74 y.o. male returns to the clinic today for follow-up visit.  The patient is feeling fine today with no concerning complaints except for mild cough productive of whitish sputum.  He underwent surgical resection of the thyroid tumor under the care of Dr. Dot Harrell and he is followed by Dr. Cruzita Harrell from endocrinology.  The patient denied having any chest pain, shortness of breath or hemoptysis.  He denied having any recent weight loss or night sweats.  He has no nausea, vomiting, diarrhea or constipation.  He has no headache or visual changes.  He had repeat CT scan of the chest performed recently and he is here for evaluation and discussion of his discuss results.  MEDICAL HISTORY: Past Medical History:  Diagnosis Date  . ALLERGIC RHINITIS 01/30/2007  . Arthritis    "hands" (05/29/2014)  . Cancer ([place])    lung, prostate  . Constipation   . Coronary artery disease    LHC (05/29/14): pLAD 50-70,  pRCA 99 (L>R collats), EF 55% >> PCI: 3.5 x 28 mm Xience [employer]pine DES to [employer]  . Cough secondary to angiotensin converting enzyme inhibitor (ACE-I) 05/28/2013  . ED (erectile dysfunction)   . GERD 09/26/2007  . HYPERLIPIDEMIA 09/26/2007  . Hypertension   . Lung cancer ([place]) dx'd 03/2018  . PLANTAR FASCIITIS, BILATERAL 07/08/2009  . Pre-diabetes   . Prostate CA ([place]) dx'd 2017 or 2018   xrt  . Unspecified hearing loss 06/16/2009   wears aides both ears    ALLERGIES:  is allergic to lisinopril.  MEDICATIONS:  Current Outpatient Medications  Medication Sig Dispense Refill  . benzonatate (TESSALON) 100 MG capsule Take 100 mg by mouth daily as needed for cough.     . calcium carbonate (TUMS) 500 MG chewable tablet Chew 2 tablets (400 mg of elemental calcium total) by mouth 2 (two) times daily. (Patient not taking: Reported on 07/18/2019) 90 tablet 1  . cetirizine (ZYRTEC) 10 MG tablet Take 1 tablet (10 mg total) by mouth daily. 30 tablet 11  . Cyanocobalamin (VITAMIN B-12 CR PO) Take 50 mcg by mouth daily.     . diphenhydrAMINE (ALLERGY) 25 MG tablet Take 25 mg by mouth daily.    Marland Kitchen ezetimibe (ZETIA) 10 MG tablet TAKE 1 TABLET BY MOUTH EVERY DAY (Patient taking differently: Take 10 mg by mouth at bedtime. ) 90 tablet 3  . fluticasone (FLONASE) 50 MCG/ACT nasal spray SHAKE LIQUID AND USE 1 SPRAY IN EACH NOSTRIL DAILY (Patient taking differently: Place 1 spray into both nostrils daily. )  48 g 0  . guaifenesin (TUSSIN) 100 MG/5ML syrup Take 400 mg by mouth 2 (two) times daily.    . hydrochlorothiazide (MICROZIDE) 12.5 MG capsule Take 1 capsule (12.5 mg total) by mouth daily. 90 capsule 3  . levothyroxine (SYNTHROID) 100 MCG tablet Take 1 tablet (100 mcg total) by mouth daily. 30 tablet 3  . losartan (COZAAR) 50 MG tablet TAKE 1 TABLET BY MOUTH EVERY DAY 90 tablet 0  . metoprolol tartrate (LOPRESSOR) 25 MG tablet Take 1 tablet (25 mg total) by mouth 2 (two) times daily. 180 tablet 0  . Multiple Vitamin  (MULTIVITAMIN WITH MINERALS) TABS tablet Take 1 tablet by mouth daily.    Marland Kitchen omeprazole (PRILOSEC) 40 MG capsule TAKE 1 CAPSULE(40 MG) BY MOUTH DAILY 90 capsule 1  . simvastatin (ZOCOR) 40 MG tablet TAKE 1 TABLET(40 MG) BY MOUTH DAILY AT 6 PM (Patient taking differently: Take 40 mg by mouth daily at 6 PM. ) 90 tablet 3  . tamsulosin (FLOMAX) 0.4 MG CAPS capsule Take 1 capsule (0.4 mg total) by mouth daily. 30 capsule 5  . traMADol (ULTRAM) 50 MG tablet Take 1-2 tablets (50-100 mg total) by mouth every 6 (six) hours as needed. 15 tablet 0   No current facility-administered medications for this visit.    SURGICAL HISTORY:  Past Surgical History:  Procedure Laterality Date  . CARDIAC CATHETERIZATION  05/29/2014   Procedure: CORONARY STENT INTERVENTION;  Surgeon: Sinclair Grooms, MD;  Location: [facility] CATH LAB;  Service: Cardiovascular;;  Prox RCA  . COLONOSCOPY    . CORONARY ANGIOPLASTY WITH STENT PLACEMENT  05/29/2014   "1"  . KNEE ARTHROSCOPY Right ~ 2005  . LEFT HEART CATHETERIZATION WITH CORONARY ANGIOGRAM N/A 05/29/2014   Procedure: LEFT HEART CATHETERIZATION WITH CORONARY ANGIOGRAM;  Surgeon: Sinclair Grooms, MD;  Location: [facility] CATH LAB;  Service: Cardiovascular;  Laterality: N/A;  . THYROIDECTOMY N/A 06/25/2019   Procedure: TOTAL THYROIDECTOMY;  Surgeon: Carl Gemma, MD;  Location: [facility] ORS;  Service: General;  Laterality: N/A;  . VIDEO BRONCHOSCOPY WITH ENDOBRONCHIAL ULTRASOUND N/A 02/20/2019   Procedure: VIDEO BRONCHOSCOPY WITH ENDOBRONCHIAL ULTRASOUND;  Surgeon: Carl Nash, DO;  Location: [place];  Service: Thoracic;  Laterality: N/A;    REVIEW OF SYSTEMS:  Constitutional: negative Eyes: negative Ears, nose, mouth, throat, and face: negative Respiratory: positive for cough Cardiovascular: negative Gastrointestinal: negative Genitourinary:negative Integument/breast: negative Hematologic/lymphatic: negative Musculoskeletal:negative Neurological: negative Behavioral/Psych:  negative Endocrine: negative Allergic/Immunologic: negative   PHYSICAL EXAMINATION: General appearance: alert, cooperative and no distress Head: Normocephalic, without obvious abnormality, atraumatic Neck: no adenopathy, no JVD, supple, symmetrical, trachea midline and thyroid not enlarged, symmetric, no tenderness/mass/nodules Lymph nodes: Cervical, supraclavicular, and axillary nodes normal. Resp: clear to auscultation bilaterally Back: symmetric, no curvature. ROM normal. No CVA tenderness. Cardio: regular rate and rhythm, S1, S2 normal, no murmur, click, rub or gallop GI: soft, non-tender; bowel sounds normal; no masses,  no organomegaly Extremities: extremities normal, atraumatic, no cyanosis or edema Neurologic: Alert and oriented X 3, normal strength and tone. Normal symmetric reflexes. Normal coordination and gait  ECOG PERFORMANCE STATUS: 1 - Symptomatic but completely ambulatory  Blood pressure 131/81, pulse 93, temperature 97.8 F (36.6 C), temperature source Oral, resp. rate 19, height 5\' 7"  (1.702 m), weight 198 lb 8 oz (90 kg), SpO2 96 %.  LABORATORY DATA: Lab Results  Component Value Date   WBC 6.4 08/13/2019   HGB 14.7 08/13/2019   HCT 45.7 08/13/2019   MCV 87.4 08/13/2019  PLT 222 08/13/2019      Chemistry      Component Value Date/Time   NA 141 08/13/2019 0738   NA 141 08/05/2016 0826   K 4.2 08/13/2019 0738   CL 107 08/13/2019 0738   CO2 24 08/13/2019 0738   BUN 11 08/13/2019 0738   BUN 14 08/05/2016 0826   CREATININE 1.10 08/13/2019 0738      Component Value Date/Time   CALCIUM 8.7 (L) 08/13/2019 0738   ALKPHOS 79 08/13/2019 0738   AST 17 08/13/2019 0738   ALT 24 08/13/2019 0738   BILITOT 0.3 08/13/2019 0738       RADIOGRAPHIC STUDIES: CT Chest W Contrast  Result Date: 08/10/2019 CLINICAL DATA:  Lung cancer restaging. EXAM: CT CHEST WITH CONTRAST TECHNIQUE: Multidetector CT imaging of the chest was performed during intravenous contrast  administration. CONTRAST:  43mL OMNIPAQUE IOHEXOL 300 MG/ML  SOLN COMPARISON:  05/07/2019 FINDINGS: Cardiovascular: The heart size is normal. No substantial pericardial effusion. Coronary artery calcification is evident. Atherosclerotic calcification is noted in the wall of the thoracic aorta. Mediastinum/Nodes: No mediastinal lymphadenopathy. No left hilar lymphadenopathy. Abnormal soft tissue in the right suprahilar region is similar to prior. The esophagus has normal imaging features. There is no axillary lymphadenopathy. Lungs/Pleura: Centrilobular emphsyema noted. Evolution of post radiation changes in the right upper lobe with volume loss and consolidative opacity progressive in the interval. The suprahilar lesion and nodularity towards the right apex are no longer evident and have likely become incorporated into the evolving scar. Progression of bandlike atelectasis or scarring noted in the medial left upper lobe. Nodular opacity in the inferior lingula likely a component of adjacent scar. Tiny left perifissural nodule on 70/7 is stable in the interval. Upper Abdomen: Wedge-shaped low-density lesion in the posterior right liver is stable. Tiny gallstones evident. No change subtle nodular thickening of both adrenal glands. Musculoskeletal: No worrisome lytic or sclerotic osseous abnormality. IMPRESSION: 1. Evolution of post radiation changes in the right upper lobe with volume loss and incorporation of the right upper lobe lesion and upper right hilar disease seen previously. 2. No new or progressive findings. 3. Cholelithiasis. 4. Aortic Atherosclerosis (ICD10-I70.0) and Emphysema (ICD10-J43.9). Electronically Signed   By: Misty Stanley M.D.   On: 08/10/2019 09:40    ASSESSMENT AND PLAN: This is a very pleasant 74 years old white male with recently diagnosed a stage IIIa (T2b, N2, M0) non-small cell lung cancer favoring adenocarcinoma presented with right upper lobe lung mass in addition to right hilar and  mediastinal lymphadenopathy diagnosed in September 2020. The patient underwent a course of concurrent chemoradiation with weekly carboplatin and paclitaxel status post 7 cycles.  He tolerated the treatment well except for fatigue and dry cough. He has partial response to this treatment.   The patient is currently undergoing consolidation treatment with immunotherapy with Imfinzi 1500 mg IV every 4 weeks status post 3 cycles.  He has been tolerating this treatment well with no concerning adverse effects. He had repeat CT scan of the chest performed recently.  I personally and independently reviewed the scans and discussed the results with the patient today. His scan showed no concerning findings for disease progression. I recommended for the patient to continue his current treatment with Imfinzi every 4 weeks as planned. He will proceed with cycle #4 today. He will come back for follow-up visit in 4 weeks for evaluation before the next cycle of his treatment. For the thyroid cancer, he underwent surgical resection and he is  followed by endocrinology. The patient was advised to call immediately if he has any concerning symptoms in the interval.  All questions were answered. The patient knows to call the clinic with any problems, questions or concerns. We can certainly see the patient much sooner if necessary.  Disclaimer: This note was dictated with voice recognition software. Similar sounding words can inadvertently be transcribed and may not be corrected upon review.

## 2019-08-16 ENCOUNTER — Telehealth: Payer: Self-pay | Admitting: Internal Medicine

## 2019-08-16 NOTE — Telephone Encounter (Signed)
Scheduled per los. Called and spoke with patient. Confirmed appt 

## 2019-08-29 ENCOUNTER — Other Ambulatory Visit: Payer: Self-pay

## 2019-08-30 ENCOUNTER — Ambulatory Visit (INDEPENDENT_AMBULATORY_CARE_PROVIDER_SITE_OTHER): Payer: Medicare Other | Admitting: Family Medicine

## 2019-08-30 ENCOUNTER — Encounter: Payer: Self-pay | Admitting: Family Medicine

## 2019-08-30 VITALS — BP 118/76 | HR 64 | Temp 97.8°F | Wt 203.0 lb

## 2019-08-30 DIAGNOSIS — I10 Essential (primary) hypertension: Secondary | ICD-10-CM

## 2019-08-30 DIAGNOSIS — C73 Malignant neoplasm of thyroid gland: Secondary | ICD-10-CM | POA: Diagnosis not present

## 2019-08-30 DIAGNOSIS — C3491 Malignant neoplasm of unspecified part of right bronchus or lung: Secondary | ICD-10-CM | POA: Diagnosis not present

## 2019-08-30 NOTE — Patient Instructions (Addendum)
Preventing High Cholesterol Cholesterol is a white, waxy substance similar to fat that the human body needs to help build cells. The liver makes all the cholesterol that a person's body needs. Having high cholesterol (hypercholesterolemia) increases a person's risk for heart disease and stroke. Extra (excess) cholesterol comes from the food the person eats. High cholesterol can often be prevented with diet and lifestyle changes. If you already have high cholesterol, you can control it with diet and lifestyle changes and with medicine. How can high cholesterol affect me? If you have high cholesterol, deposits (plaques) may build up on the walls of your arteries. The arteries are the blood vessels that carry blood away from your heart. Plaques make the arteries narrower and stiffer. This can limit or block blood flow and cause blood clots to form. Blood clots:  Are tiny balls of cells that form in your blood.  Can move to the heart or brain, causing a heart attack or stroke. Plaques in arteries greatly increase your risk for heart attack and stroke.Making diet and lifestyle changes can reduce your risk for these conditions that may threaten your life. What can increase my risk? This condition is more likely to develop in people who:  Eat foods that are high in saturated fat or cholesterol. Saturated fat is mostly found in: ? Foods that contain animal fat, such as red meat and some dairy products. ? Certain fatty foods made from plants, such as tropical oils.  Are overweight.  Are not getting enough exercise.  Have a family history of high cholesterol. What actions can I take to prevent this? Nutrition   Eat less saturated fat.  Avoid trans fats (partially hydrogenated oils). These are often found in margarine and in some baked goods, fried foods, and snacks bought in packages.  Avoid precooked or cured meat, such as sausages or meat loaves.  Avoid foods and drinks that have added  sugars.  Eat more fruits, vegetables, and whole grains.  Choose healthy sources of protein, such as fish, poultry, lean cuts of red meat, beans, peas, lentils, and nuts.  Choose healthy sources of fat, such as: ? Nuts. ? Vegetable oils, especially olive oil. ? Fish that have healthy fats (omega-3 fatty acids), such as mackerel or salmon. The items listed above may not be a complete list of recommended foods and beverages. Contact a dietitian for more information. Lifestyle  Lose weight if you are overweight. Losing 5-10 lb (2.3-4.5 kg) can help prevent or control high cholesterol. It can also lower your risk for diabetes and high blood pressure. Ask your health care provider to help you with a diet and exercise plan to lose weight safely.  Do not use any products that contain nicotine or tobacco, such as cigarettes, e-cigarettes, and chewing tobacco. If you need help quitting, ask your health care provider.  Limit your alcohol intake. ? Do not drink alcohol if:  Your health care provider tells you not to drink.  You are pregnant, may be pregnant, or are planning to become pregnant. ? If you drink alcohol:  Limit how much you use to:  0-1 drink a day for women.  0-2 drinks a day for men.  Be aware of how much alcohol is in your drink. In the [place] one drink equals one 12 oz bottle of beer (355 mL), one 5 oz glass of wine (148 mL), or one 1 oz glass of hard liquor (44 mL). Activity   Get enough exercise. Each week, do at  least 150 minutes of exercise that takes a medium level of effort (moderate-intensity exercise). ? This is exercise that:  Makes your heart beat faster and makes you breathe harder than usual.  Allows you to still be able to talk. ? You could exercise in short sessions several times a day or longer sessions a few times a week. For example, on 5 days each week, you could walk fast or ride your bike 3 times a day for 10 minutes each time.  Do exercises as told  by your health care provider. Medicines  In addition to diet and lifestyle changes, your health care provider may recommend medicines to help lower cholesterol. This may be a medicine to lower the amount of cholesterol your liver makes. You may need medicine if: ? Diet and lifestyle changes do not lower your cholesterol enough. ? You have high cholesterol and other risk factors for heart disease or stroke.  Take over-the-counter and prescription medicines only as told by your health care provider. General information  Manage your risk factors for high cholesterol. Talk with your health care provider about all your risk factors and how to lower your risk.  Manage other conditions that you have, such as diabetes or high blood pressure (hypertension).  Have blood tests to check your cholesterol levels at regular points in time as told by your health care provider.  Keep all follow-up visits as told by your health care provider. This is important. Where to find more information  American Heart Association: www.heart.org  National Heart, Lung, and Blood Institute: https://wilson-eaton.com/ Summary  High cholesterol increases your risk for heart disease and stroke. By keeping your cholesterol level low, you can reduce your risk for these conditions.  High cholesterol can often be prevented with diet and lifestyle changes.  Work with your health care provider to manage your risk factors, and have your blood tested regularly. This information is not intended to replace advice given to you by your health care provider. Make sure you discuss any questions you have with your health care provider. Document Revised: 09/01/2018 Document Reviewed: 01/17/2016 Elsevier Patient Education  [place].  Thyroid Nodule  A thyroid nodule is an isolated growth of thyroid cells that forms a lump in your thyroid gland. The thyroid gland is a butterfly-shaped gland. It is found in the lower front of your  neck. This gland sends chemical messengers (hormones) through your blood to all parts of your body. These hormones are important in regulating your body temperature and helping your body to use energy. Thyroid nodules are common. Most are not cancerous (benign). You may have one nodule or several nodules. Different types of thyroid nodules include nodules that:  Grow and fill with fluid (thyroid cysts).  Produce too much thyroid hormone (hot nodules or hyperthyroid).  Produce no thyroid hormone (cold nodules or hypothyroid).  Form from cancer cells (thyroid cancers). What are the causes? In most cases, the cause of this condition is not known. What increases the risk? The following factors may make you more likely to develop this condition.  Age. Thyroid nodules become more common in people who are older than 76 years of age.  Gender. ? Benign thyroid nodules are more common in women. ? Cancerous (malignant) thyroid nodules are more common in men.  A family history that includes: ? Thyroid nodules. ? Pheochromocytoma. ? Thyroid carcinoma. ? Hyperparathyroidism.  Certain kinds of thyroid diseases, such as Hashimoto's thyroiditis.  Lack of iodine in your diet.  A history of head and neck radiation, such as from previous cancer treatment. What are the signs or symptoms? In many cases, there are no symptoms. If you have symptoms, they may include:  A lump in your lower neck.  Feeling a lump or tickle in your throat.  Pain in your neck, jaw, or ear.  Having trouble swallowing. Hot nodules may cause symptoms that include:  Weight loss.  Warm, flushed skin.  Feeling hot.  Feeling nervous.  A racing heartbeat. Cold nodules may cause symptoms that include:  Weight gain.  Dry skin.  Brittle hair. This may also occur with hair loss.  Feeling cold.  Fatigue. Thyroid cancer nodules may cause symptoms that include:  Hard nodules that feel stuck to the thyroid  gland.  Hoarseness.  Lumps in the glands near your thyroid (lymph nodes). How is this diagnosed? A thyroid nodule may be felt by your health care provider during a physical exam. This condition may also be diagnosed based on your symptoms. You may also have tests, including:  An ultrasound. This may be done to confirm the diagnosis.  A biopsy. This involves taking a sample from the nodule and looking at it under a microscope.  Blood tests to make sure that your thyroid is working properly.  A thyroid scan. This test uses a radioactive tracer injected into a vein to create an image of the thyroid gland on a computer screen.  Imaging tests such as MRI or CT scan. These may be done if: ? Your nodule is large. ? Your nodule is blocking your airway. ? Cancer is suspected. How is this treated? Treatment depends on the cause and size of your nodule or nodules. If the nodule is benign, treatment may not be necessary. Your health care provider may monitor the nodule to see if it goes away without treatment. If the nodule continues to grow, is cancerous, or does not go away, treatment may be needed. Treatment may include:  Having a cystic nodule drained with a needle.  Ablation therapy. In this treatment, alcohol is injected into the area of the nodule to destroy the cells. Ablation with heat (thermal ablation) may also be used.  Radioactive iodine. In this treatment, radioactive iodine is given as a pill or liquid that you drink. This substance causes the thyroid nodule to shrink.  Surgery to remove the nodule. Part or all of your thyroid gland may need to be removed as well.  Medicines. Follow these instructions at home:  Pay attention to any changes in your nodule.  Take over-the-counter and prescription medicines only as told by your health care provider.  Keep all follow-up visits as told by your health care provider. This is important. Contact a health care provider if:  Your  voice changes.  You have trouble swallowing.  You have pain in your neck, ear, or jaw that is getting worse.  Your nodule gets bigger.  Your nodule starts to make it harder for you to breathe.  Your muscles look like they are shrinking (muscle wasting). Get help right away if:  You have chest pain.  There is a loss of consciousness.  You have a sudden fever.  You feel confused.  You are seeing or hearing things that other people do not see or hear (having hallucinations).  You feel very weak.  You have mood swings.  You feel very restless.  You feel suddenly nauseous or throw up.  You suddenly have diarrhea. Summary  A thyroid nodule  is an isolated growth of thyroid cells that forms a lump in your thyroid gland.  Thyroid nodules are common. Most are not cancerous (benign). You may have one nodule or several nodules.  Treatment depends on the cause and size of your nodule or nodules. If the nodule is benign, treatment may not be necessary.  Your health care provider may monitor the nodule to see if it goes away without treatment. If the nodule continues to grow, is cancerous, or does not go away, treatment may be needed. This information is not intended to replace advice given to you by your health care provider. Make sure you discuss any questions you have with your health care provider. Document Revised: 12/23/2017 Document Reviewed: 12/26/2017 Elsevier Patient Education  [place].

## 2019-09-01 ENCOUNTER — Other Ambulatory Visit: Payer: Self-pay | Admitting: Family Medicine

## 2019-09-02 ENCOUNTER — Encounter: Payer: Self-pay | Admitting: Family Medicine

## 2019-09-02 NOTE — Progress Notes (Signed)
Subjective:    Patient ID: Carl Harrell, male    DOB: 09/01/1945, 74 y.o.   MRN: [id number]  No chief complaint on file.   HPI Patient was seen today for f/u.  Pt undergoing treatment for adenocarcinoma of right lung after nodule noted on imaging in Sept 2020 after a prolonged cough.  Pt also noted to have a follicular neoplasm of left thyroid s/p resection.  Taking synthroid daily.  Pt states he is feeling good overall and is considering "some radioactive test" with Endo.  States bp has been good.  Pt denies palpitations, dizziness, SOB, hoarseness, muscle twitching.  Pt mentions an upcoming trip to [place].  Plans to stay for a month.  Would also like to go on a cruise again. Past Medical History:  Diagnosis Date  . ALLERGIC RHINITIS 01/30/2007  . Arthritis    "hands" (05/29/2014)  . Cancer ([place])    lung, prostate  . Constipation   . Coronary artery disease    LHC (05/29/14): pLAD 50-70, pRCA 99 (L>R collats), EF 55% >> PCI: 3.5 x 28 mm Xience Alpine DES to [employer]  . Cough secondary to angiotensin converting enzyme inhibitor (ACE-I) 05/28/2013  . ED (erectile dysfunction)   . GERD 09/26/2007  . HYPERLIPIDEMIA 09/26/2007  . Hypertension   . Lung cancer ([place]) dx'd 03/2018  . PLANTAR FASCIITIS, BILATERAL 07/08/2009  . Pre-diabetes   . Prostate CA ([place]) dx'd 2017 or 2018   xrt  . Unspecified hearing loss 06/16/2009   wears aides both ears    Allergies  Allergen Reactions  . Lisinopril Cough    ROS General: Denies fever, chills, night sweats, changes in weight, changes in appetite HEENT: Denies headaches, ear pain, changes in vision, rhinorrhea, sore throat CV: Denies CP, palpitations, SOB, orthopnea Pulm: Denies SOB, cough, wheezing GI: Denies abdominal pain, nausea, vomiting, diarrhea, constipation GU: Denies dysuria, hematuria, frequency Msk: Denies muscle cramps, joint pains Neuro: Denies weakness, numbness, tingling Skin: Denies rashes, bruising Psych: Denies depression, anxiety,  hallucinations    Objective:    Blood pressure 118/76, pulse 64, temperature 97.8 F (36.6 C), temperature source Temporal, weight 203 lb (92.1 kg), SpO2 97 %.   Gen. Pleasant, well-nourished, in no distress, normal affect   HEENT: [place]/AT, face symmetric, no scleral icterus, PERRLA, EOMI, nares patent without drainage Lungs: no accessory muscle use, CTAB, no wheezes or rales Cardiovascular: RRR, no m/r/g, no peripheral edema Musculoskeletal: No deformities, no cyanosis or clubbing, normal tone Neuro:  A&Ox3, CN II-XII intact, normal gait Skin:  Warm, no lesions/ rash.  Well healed surgical incision at base of neck.   Wt Readings from Last 3 Encounters:  08/30/19 203 lb (92.1 kg)  08/15/19 198 lb 8 oz (90 kg)  08/07/19 199 lb (90.3 kg)    Lab Results  Component Value Date   WBC 6.4 08/13/2019   HGB 14.7 08/13/2019   HCT 45.7 08/13/2019   PLT 222 08/13/2019   GLUCOSE 119 (H) 08/13/2019   CHOL 125 07/18/2018   TRIG 115.0 07/18/2018   HDL 39.90 07/18/2018   LDLCALC 62 07/18/2018   ALT 24 08/13/2019   AST 17 08/13/2019   NA 141 08/13/2019   K 4.2 08/13/2019   CL 107 08/13/2019   CREATININE 1.10 08/13/2019   BUN 11 08/13/2019   CO2 24 08/13/2019   TSH 4.774 (H) 08/13/2019   PSA 7.38 (H) 10/25/2016   INR 1.0 05/28/2014   HGBA1C 5.9 (H) 06/21/2019   MICROALBUR <0.7 06/09/2017  Assessment/Plan:  Essential hypertension -Controlled -Continue lifestyle modifications -Continue HCTZ 12.5 mg, losartan 50 mg, Lopressor 25 mg BID  Primary thyroid follicular carcinoma (HCC) -s/p total thyroidectomy on 06/25/19 -Continue levothyroxine 100 mcg daily -Question answered regarding RAI -Continue follow-up with endocrinology, Dr. Cruzita Lederer  Adenocarcinoma of right lung, stage 3 (HCC) -stage IIIa (T2b, N2, M0) non-small cell lung cancer  -completed 7 cycles of chemoradiation -Continue consolidation immunotherapy with Imfinzi IV every 4 weeks -Follow-up CT for restaging  planned -continue f/u with Oncology, Dr. Julien Nordmann  F/u in the next few months  [name], MD

## 2019-09-07 NOTE — Progress Notes (Signed)
Pharmacist Chemotherapy Monitoring - Follow Up Assessment    I verify that I have reviewed each item in the below checklist:  . Regimen for the patient is scheduled for the appropriate day and plan matches scheduled date. Marland Kitchen Appropriate non-routine labs are ordered dependent on drug ordered. . If applicable, additional medications reviewed and ordered per protocol based on lifetime cumulative doses and/or treatment regimen.   Plan for follow-up and/or issues identified: No . I-vent associated with next due treatment: No . MD and/or nursing notified: No  Acquanetta Belling 09/07/2019 2:18 PM

## 2019-09-11 ENCOUNTER — Other Ambulatory Visit: Payer: Self-pay

## 2019-09-11 MED ORDER — LOSARTAN POTASSIUM 50 MG PO TABS: 50.0000 mg | ORAL_TABLET | Freq: Every day | ORAL | 0 refills | Status: DC

## 2019-09-13 ENCOUNTER — Telehealth: Payer: Self-pay | Admitting: Internal Medicine

## 2019-09-13 ENCOUNTER — Other Ambulatory Visit: Payer: Self-pay

## 2019-09-13 ENCOUNTER — Encounter: Payer: Self-pay | Admitting: Internal Medicine

## 2019-09-13 ENCOUNTER — Inpatient Hospital Stay: Payer: Medicare Other

## 2019-09-13 ENCOUNTER — Inpatient Hospital Stay: Payer: Medicare Other | Attending: Internal Medicine | Admitting: Internal Medicine

## 2019-09-13 VITALS — BP 113/80 | HR 71 | Temp 98.7°F | Resp 20 | Ht 67.0 in | Wt 202.5 lb

## 2019-09-13 DIAGNOSIS — Z8546 Personal history of malignant neoplasm of prostate: Secondary | ICD-10-CM | POA: Insufficient documentation

## 2019-09-13 DIAGNOSIS — C3491 Malignant neoplasm of unspecified part of right bronchus or lung: Secondary | ICD-10-CM

## 2019-09-13 DIAGNOSIS — Z923 Personal history of irradiation: Secondary | ICD-10-CM | POA: Insufficient documentation

## 2019-09-13 DIAGNOSIS — I10 Essential (primary) hypertension: Secondary | ICD-10-CM | POA: Insufficient documentation

## 2019-09-13 DIAGNOSIS — C73 Malignant neoplasm of thyroid gland: Secondary | ICD-10-CM | POA: Insufficient documentation

## 2019-09-13 DIAGNOSIS — R7303 Prediabetes: Secondary | ICD-10-CM | POA: Diagnosis not present

## 2019-09-13 DIAGNOSIS — I2510 Atherosclerotic heart disease of native coronary artery without angina pectoris: Secondary | ICD-10-CM | POA: Diagnosis not present

## 2019-09-13 DIAGNOSIS — C3411 Malignant neoplasm of upper lobe, right bronchus or lung: Secondary | ICD-10-CM | POA: Insufficient documentation

## 2019-09-13 DIAGNOSIS — Z5112 Encounter for antineoplastic immunotherapy: Secondary | ICD-10-CM | POA: Diagnosis not present

## 2019-09-13 DIAGNOSIS — Z79899 Other long term (current) drug therapy: Secondary | ICD-10-CM | POA: Insufficient documentation

## 2019-09-13 DIAGNOSIS — M1990 Unspecified osteoarthritis, unspecified site: Secondary | ICD-10-CM | POA: Diagnosis not present

## 2019-09-13 LAB — CBC WITH DIFFERENTIAL (CANCER CENTER ONLY)
Abs Immature Granulocytes: 0.02 10*3/uL (ref 0.00–0.07)
Basophils Absolute: 0 10*3/uL (ref 0.0–0.1)
Basophils Relative: 0 %
Eosinophils Absolute: 0.3 10*3/uL (ref 0.0–0.5)
Eosinophils Relative: 6 %
HCT: 46.2 % (ref 39.0–52.0)
Hemoglobin: 14.8 g/dL (ref 13.0–17.0)
Immature Granulocytes: 0 %
Lymphocytes Relative: 19 %
Lymphs Abs: 1.1 10*3/uL (ref 0.7–4.0)
MCH: 27 pg (ref 26.0–34.0)
MCHC: 32 g/dL (ref 30.0–36.0)
MCV: 84.2 fL (ref 80.0–100.0)
Monocytes Absolute: 0.7 10*3/uL (ref 0.1–1.0)
Monocytes Relative: 11 %
Neutro Abs: 3.9 10*3/uL (ref 1.7–7.7)
Neutrophils Relative %: 64 %
Platelet Count: 223 10*3/uL (ref 150–400)
RBC: 5.49 MIL/uL (ref 4.22–5.81)
RDW: 13.3 % (ref 11.5–15.5)
WBC Count: 6.1 10*3/uL (ref 4.0–10.5)
nRBC: 0 % (ref 0.0–0.2)

## 2019-09-13 LAB — CMP (CANCER CENTER ONLY)
ALT: 23 U/L (ref 0–44)
AST: 17 U/L (ref 15–41)
Albumin: 3.7 g/dL (ref 3.5–5.0)
Alkaline Phosphatase: 67 U/L (ref 38–126)
Anion gap: 10 (ref 5–15)
BUN: 11 mg/dL (ref 8–23)
CO2: 25 mmol/L (ref 22–32)
Calcium: 9 mg/dL (ref 8.9–10.3)
Chloride: 106 mmol/L (ref 98–111)
Creatinine: 1.03 mg/dL (ref 0.61–1.24)
GFR, Est AFR Am: >60 mL/min (ref >60)
GFR, Estimated: >60 mL/min (ref >60)
Glucose, Bld: 131 mg/dL — ABNORMAL HIGH (ref 70–99)
Potassium: 4.3 mmol/L (ref 3.5–5.1)
Sodium: 141 mmol/L (ref 135–145)
Total Bilirubin: 0.6 mg/dL (ref 0.3–1.2)
Total Protein: 7.2 g/dL (ref 6.5–8.1)

## 2019-09-13 LAB — TSH: TSH: 1.662 u[IU]/mL (ref 0.320–4.118)

## 2019-09-13 MED ORDER — SODIUM CHLORIDE 0.9 % IV SOLN
Freq: Once | INTRAVENOUS | Status: AC
  Filled 2019-09-13: qty 250

## 2019-09-13 MED ORDER — SODIUM CHLORIDE 0.9 % IV SOLN
1500.0000 mg | Freq: Once | INTRAVENOUS | Status: AC
  Administered 2019-09-13: 1500 mg via INTRAVENOUS
  Filled 2019-09-13: qty 30

## 2019-09-13 NOTE — Telephone Encounter (Signed)
Added an additional cycle per 4/22 los - pt to get an updated schedule in tx area

## 2019-09-13 NOTE — Progress Notes (Signed)
[facility] [phone]   [phone]  OFFICE PROGRESS NOTE  Carl Ruddy, MD [address] [place] [address]  DIAGNOSIS:  1) Stage IIIA (T2b, N2, M0) non-small cell lung cancer likely adenocarcinoma pending final tissue diagnosis presented with right upper lobe lung mass in addition to right hilar and mediastinal lymphadenopathy diagnosed in September 2020 2) history of prostate adenocarcinoma status post radiotherapy under the care of Dr. Tammi Klippel. 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  PRIOR THERAPY: Concurrent chemoradiation with weekly carboplatin for AUC of 2 and paclitaxel 45 mg/M2.  First dose March 05, 2019.  Status post 7 cycles.  Last dose was given April 16, 2019 with partial response.  CURRENT THERAPY: Consolidation treatment with immunotherapy with Imfinzi 1500 mg IV every 4 weeks.  First dose May 22, 2019.  Status post 4 cycles.  INTERVAL HISTORY: Carl Harrell 74 y.o. male returns to the clinic today for follow-up visit.  The patient is feeling fine today with no concerning complaints except for occasional pain in the left sternal area but no radiation to the jaw or the arm.  It is more consistent with muscular pain.  He denied having any shortness of breath, cough or hemoptysis.  He denied having any fever or chills.  He has no nausea, vomiting, diarrhea or constipation.  He has no skin rash or itching.  He continues to tolerate his treatment with Imfinzi fairly well.  He is here today for evaluation before starting cycle #5.  MEDICAL HISTORY: Past Medical History:  Diagnosis Date  . ALLERGIC RHINITIS 01/30/2007  . Arthritis    "hands" (05/29/2014)  . Cancer ([place])    lung, prostate  . Constipation   . Coronary artery disease    LHC (05/29/14): pLAD 50-70, pRCA 99 (L>R collats), EF 55% >> PCI: 3.5 x 28 mm Xience Alpine DES to [employer]  . Cough  secondary to angiotensin converting enzyme inhibitor (ACE-I) 05/28/2013  . ED (erectile dysfunction)   . GERD 09/26/2007  . HYPERLIPIDEMIA 09/26/2007  . Hypertension   . Lung cancer ([place]) dx'd 03/2018  . PLANTAR FASCIITIS, BILATERAL 07/08/2009  . Pre-diabetes   . Prostate CA ([place]) dx'd 2017 or 2018   xrt  . Unspecified hearing loss 06/16/2009   wears aides both ears    ALLERGIES:  is allergic to lisinopril.  MEDICATIONS:  Current Outpatient Medications  Medication Sig Dispense Refill  . benzonatate (TESSALON) 100 MG capsule Take 100 mg by mouth daily as needed for cough.     . calcium carbonate (TUMS) 500 MG chewable tablet Chew 2 tablets (400 mg of elemental calcium total) by mouth 2 (two) times daily. 90 tablet 1  . cetirizine (ZYRTEC) 10 MG tablet Take 1 tablet (10 mg total) by mouth daily. 30 tablet 11  . Cyanocobalamin (VITAMIN B-12 CR PO) Take 50 mcg by mouth daily.     . diphenhydrAMINE (ALLERGY) 25 MG tablet Take 25 mg by mouth daily.    Marland Kitchen ezetimibe (ZETIA) 10 MG tablet TAKE 1 TABLET BY MOUTH EVERY DAY (Patient taking differently: Take 10 mg by mouth at bedtime. ) 90 tablet 3  . fluticasone (FLONASE) 50 MCG/ACT nasal spray SHAKE LIQUID AND USE 1 SPRAY IN EACH NOSTRIL DAILY (Patient taking differently: Place 1 spray into both nostrils daily. ) 48 g 0  . guaifenesin (TUSSIN) 100 MG/5ML syrup Take 400 mg by mouth 2 (two) times  daily.    . hydrochlorothiazide (MICROZIDE) 12.5 MG capsule Take 1 capsule (12.5 mg total) by mouth daily. 90 capsule 3  . levothyroxine (SYNTHROID) 100 MCG tablet Take 1 tablet (100 mcg total) by mouth daily. 30 tablet 3  . losartan (COZAAR) 50 MG tablet Take 1 tablet (50 mg total) by mouth daily. Please keep upcoming appt in April with Dr. Radford Pax before anymore refills. Thank you 90 tablet 0  . metoprolol tartrate (LOPRESSOR) 25 MG tablet TAKE 1 TABLET(25 MG) BY MOUTH TWICE DAILY 180 tablet 5  . Multiple Vitamin (MULTIVITAMIN WITH MINERALS) TABS tablet Take 1  tablet by mouth daily.    Marland Kitchen omeprazole (PRILOSEC) 40 MG capsule TAKE 1 CAPSULE(40 MG) BY MOUTH DAILY 90 capsule 1  . simvastatin (ZOCOR) 40 MG tablet TAKE 1 TABLET(40 MG) BY MOUTH DAILY AT 6 PM (Patient taking differently: Take 40 mg by mouth daily at 6 PM. ) 90 tablet 3  . tamsulosin (FLOMAX) 0.4 MG CAPS capsule Take 1 capsule (0.4 mg total) by mouth daily. 30 capsule 5  . traMADol (ULTRAM) 50 MG tablet Take 1-2 tablets (50-100 mg total) by mouth every 6 (six) hours as needed. 15 tablet 0   No current facility-administered medications for this visit.    SURGICAL HISTORY:  Past Surgical History:  Procedure Laterality Date  . CARDIAC CATHETERIZATION  05/29/2014   Procedure: CORONARY STENT INTERVENTION;  Surgeon: Sinclair Grooms, MD;  Location[facility] CATH LAB;  Service: Cardiovascular;;  Prox RCA  . COLONOSCOPY    . CORONARY ANGIOPLASTY WITH STENT PLACEMENT  05/29/2014   "1"  . KNEE ARTHROSCOPY Right ~ 2005  . LEFT HEART CATHETERIZATION WITH CORONARY ANGIOGRAM N/A 05/29/2014   Procedure: LEFT HEART CATHETERIZATION WITH CORONARY ANGIOGRAM;  Surgeon: Sinclair Grooms, MD;  Location: [facility] C[facility] LAB;  Service: Cardiovascular;  Laterality: N/A;  . THYROIDECTOMY N/A 06/25/2019   Procedure: TOTAL THYROIDECTOMY;  Surgeon: Armandina Gemma, MD;  Location: [facility] ORS;  Service: General;  Laterality: N/A;  . VIDEO BRONCHOSCOPY WITH ENDOBRONCHIAL ULTRASOUND N/A 02/20/2019   Procedure: VIDEO BRONCHOSCOPY WITH ENDOBRONCHIAL ULTRASOUND;  Surgeon: Garner Nash, DO;  Location: [facility] OR;  Service: Thoracic;  Laterality: N/A;    REVIEW OF SYSTEMS:  A comprehensive review of systems was negative.   PHYSICAL EXAMINATION: General appearance: alert, cooperative and no distress Head: Normocephalic, without obvious abnormality, atraumatic Neck: no adenopathy, no JVD, supple, symmetrical, trachea midline and thyroid not enlarged, symmetric, no tenderness/mass/nodules Lymph nodes: Cervical, supraclavicular, and axillary nodes  normal. Resp: clear to auscultation bilaterally Back: symmetric, no curvature. ROM normal. No CVA tenderness. Cardio: regular rate and rhythm, S1, S2 normal, no murmur, click, rub or gallop GI: soft, non-tender; bowel sounds normal; no masses,  no organomegaly Extremities: extremities normal, atraumatic, no cyanosis or edema  ECOG PERFORMANCE STATUS: 1 - Symptomatic but completely ambulatory  Blood pressure 113/80, pulse 71, temperature 98.7 F (37.1 C), temperature source Temporal, resp. rate 20, height 5\' 7"  (1.702 m), weight 202 lb 8 oz (91.9 kg), SpO2 98 %.  LABORATORY DATA: Lab Results  Component Value Date   WBC 6.1 09/13/2019   HGB 14.8 09/13/2019   HCT 46.2 09/13/2019   MCV 84.2 09/13/2019   PLT 223 09/13/2019      Chemistry      Component Value Date/Time   NA 141 09/13/2019 1102   NA 141 08/05/2016 0826   K 4.3 09/13/2019 1102   CL 106 09/13/2019 1102   CO2 25 09/13/2019 1102  BUN 11 09/13/2019 1102   BUN 14 08/05/2016 0826   CREATININE 1.03 09/13/2019 1102      Component Value Date/Time   CALCIUM 9.0 09/13/2019 1102   ALKPHOS 67 09/13/2019 1102   AST 17 09/13/2019 1102   ALT 23 09/13/2019 1102   BILITOT 0.6 09/13/2019 1102       RADIOGRAPHIC STUDIES: No results found.  ASSESSMENT AND PLAN: This is a very pleasant 75 years old white male with recently diagnosed a stage IIIa (T2b, N2, M0) non-small cell lung cancer favoring adenocarcinoma presented with right upper lobe lung mass in addition to right hilar and mediastinal lymphadenopathy diagnosed in September 2020. The patient underwent a course of concurrent chemoradiation with weekly carboplatin and paclitaxel status post 7 cycles.  He tolerated the treatment well except for fatigue and dry cough. He has partial response to this treatment.   The patient is currently undergoing consolidation treatment with immunotherapy with Imfinzi 1500 mg IV every 4 weeks status post 4 cycles.  He continues to tolerate  this treatment well with no concerning adverse effects. I recommended for him to proceed with cycle #5 today as planned. He will come back for follow-up visit in 4 weeks for evaluation before starting cycle #6. The patient was advised to call immediately if he has any concerning symptoms in the interval. For the thyroid cancer, he underwent surgical resection and he is followed by endocrinology. All questions were answered. The patient knows to call the clinic with any problems, questions or concerns. We can certainly see the patient much sooner if necessary.  Disclaimer: This note was dictated with voice recognition software. Similar sounding words can inadvertently be transcribed and may not be corrected upon review.

## 2019-09-13 NOTE — Patient Instructions (Signed)
[facility] Discharge Instructions for Patients Receiving Chemotherapy  Today you received the following chemotherapy agents: durvalumab.  To help prevent nausea and vomiting after your treatment, we encourage you to take your nausea medication as directed.   If you develop nausea and vomiting that is not controlled by your nausea medication, call the clinic.   BELOW ARE SYMPTOMS THAT SHOULD BE REPORTED IMMEDIATELY:  *FEVER GREATER THAN 100.5 F  *CHILLS WITH OR WITHOUT FEVER  NAUSEA AND VOMITING THAT IS NOT CONTROLLED WITH YOUR NAUSEA MEDICATION  *UNUSUAL SHORTNESS OF BREATH  *UNUSUAL BRUISING OR BLEEDING  TENDERNESS IN MOUTH AND THROAT WITH OR WITHOUT PRESENCE OF ULCERS  *URINARY PROBLEMS  *BOWEL PROBLEMS  UNUSUAL RASH Items with * indicate a potential emergency and should be followed up as soon as possible.  Feel free to call the clinic should you have any questions or concerns. The clinic phone number is [phone].  Please show the CHEMO ALERT CARD at check-in to the Emergency Department and triage nurse.   

## 2019-09-17 NOTE — Progress Notes (Signed)
Cardiology Office Note:    Date:  09/18/2019   ID:  Carl Harrell, DOB 1945-08-26, MRN [id number]  PCP:  Carl Ruddy, MD  Cardiologist:  No primary care provider on file.    Referring MD: Carl Ruddy, MD   Chief Complaint  Patient presents with  . Coronary Artery Disease  . Hyperlipidemia    History of Present Illness:    Carl Harrell is a 74 y.o. male with a hx of TIA, HL, GERD, PAD, ASCAD with LHC 05/29/14 demonstratinga subtotally occluded native RCA with 99% proximal stenosiss/pDES. There was moderate stenosis noted in the LAD at 50-70%. There was normal anterior perfusion on nuclear stress testin 2016.  Since I saw him last he was dx with stage 3 lung CA and had chemo and XRT as well as thyroid CA.    He is here today for followup and is doing well.  He denies any chest pain or pressure,  PND, orthopnea, LE edema, dizziness, palpitations or syncope. He has some mild DOE related to his lung CA.  He is compliant with his meds and is tolerating meds with no SE.    Past Medical History:  Diagnosis Date  . ALLERGIC RHINITIS 01/30/2007  . Arthritis    "hands" (05/29/2014)  . Cancer ([place])    lung, prostate  . Constipation   . Coronary artery disease    LHC (05/29/14): pLAD 50-70, pRCA 99 (L>R collats), EF 55% >> PCI: 3.5 x 28 mm Xience Alpine DES to [employer]  . Cough secondary to angiotensin converting enzyme inhibitor (ACE-I) 05/28/2013  . ED (erectile dysfunction)   . GERD 09/26/2007  . HYPERLIPIDEMIA 09/26/2007  . Hypertension   . Lung cancer ([place]) dx'd 03/2018  . PLANTAR FASCIITIS, BILATERAL 07/08/2009  . Pre-diabetes   . Prostate CA ([place]) dx'd 2017 or 2018   xrt  . Unspecified hearing loss 06/16/2009   wears aides both ears    Past Surgical History:  Procedure Laterality Date  . CARDIAC CATHETERIZATION  05/29/2014   Procedure: CORONARY STENT INTERVENTION;  Surgeon: Sinclair Grooms, MD;  Location: [facility] CATH LAB;  Service: Cardiovascular;;  Prox RCA  . COLONOSCOPY      . CORONARY ANGIOPLASTY WITH STENT PLACEMENT  05/29/2014   "1"  . KNEE ARTHROSCOPY Right ~ 2005  . LEFT HEART CATHETERIZATION WITH CORONARY ANGIOGRAM N/A 05/29/2014   Procedure: LEFT HEART CATHETERIZATION WITH CORONARY ANGIOGRAM;  Surgeon: Sinclair Grooms, MD;  Location: [facility] CATH LAB;  Service: Cardiovascular;  Laterality: N/A;  . THYROIDECTOMY N/A 06/25/2019   Procedure: TOTAL THYROIDECTOMY;  Surgeon: Armandina Gemma, MD;  Location: [facility] ORS;  Service: General;  Laterality: N/A;  . VIDEO BRONCHOSCOPY WITH ENDOBRONCHIAL ULTRASOUND N/A 02/20/2019   Procedure: VIDEO BRONCHOSCOPY WITH ENDOBRONCHIAL ULTRASOUND;  Surgeon: Garner Nash, DO;  Location: [facility] OR;  Service: Thoracic;  Laterality: N/A;    Current Medications: Current Meds  Medication Sig  . cetirizine (ZYRTEC) 10 MG tablet Take 1 tablet (10 mg total) by mouth daily.  Marland Kitchen ezetimibe (ZETIA) 10 MG tablet TAKE 1 TABLET BY MOUTH EVERY DAY (Patient taking differently: Take 10 mg by mouth at bedtime. )  . fluticasone (FLONASE) 50 MCG/ACT nasal spray SHAKE LIQUID AND USE 1 SPRAY IN EACH NOSTRIL DAILY (Patient taking differently: Place 1 spray into both nostrils daily. )  . levothyroxine (SYNTHROID) 100 MCG tablet Take 1 tablet (100 mcg total) by mouth daily.  Marland Kitchen losartan (COZAAR) 50 MG tablet Take 1 tablet (50  mg total) by mouth daily. Please keep upcoming appt in April with Dr. Radford Pax before anymore refills. Thank you  . metoprolol tartrate (LOPRESSOR) 25 MG tablet TAKE 1 TABLET(25 MG) BY MOUTH TWICE DAILY  . Multiple Vitamin (MULTIVITAMIN WITH MINERALS) TABS tablet Take 1 tablet by mouth daily.  Marland Kitchen omeprazole (PRILOSEC) 40 MG capsule TAKE 1 CAPSULE(40 MG) BY MOUTH DAILY  . simvastatin (ZOCOR) 40 MG tablet TAKE 1 TABLET(40 MG) BY MOUTH DAILY AT 6 PM (Patient taking differently: Take 40 mg by mouth daily at 6 PM. )  . tamsulosin (FLOMAX) 0.4 MG CAPS capsule Take 1 capsule (0.4 mg total) by mouth daily.     Allergies:   Lisinopril   Social History    Socioeconomic History  . Marital status: Married    Spouse name: Not on file  . Number of children: Not on file  . Years of education: Not on file  . Highest education level: Not on file  Occupational History  . Not on file  Tobacco Use  . Smoking status: Former Smoker    Packs/day: 0.20    Years: 15.00    Pack years: 3.00    Types: Cigarettes    Quit date: 05/24/1978    Years since quitting: 41.3  . Smokeless tobacco: Never Used  . Tobacco comment: 05/29/2014 "smoked socially; nothing since 1980"  Substance and Sexual Activity  . Alcohol use: Yes    Alcohol/week: 2.0 standard drinks    Types: 2 Shots of liquor per week    Comment: beer or two a week  . Drug use: No  . Sexual activity: Not Currently  Other Topics Concern  . Not on file  Social History Narrative   Lives with wife in a one story home.  Has 2 children.  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  Education: 3 years of college.    Does wood working.    Social Determinants of Health   Financial Resource Strain:   . Difficulty of Paying Living Expenses:   Food Insecurity:   . Worried About Charity fundraiser in the Last Year:   . Arboriculturist in the Last Year:   Transportation Needs:   . Film/video editor (Medical):   Marland Kitchen Lack of Transportation (Non-Medical):   Physical Activity:   . Days of Exercise per Week:   . Minutes of Exercise per Session:   Stress:   . Feeling of Stress :   Social Connections:   . Frequency of Communication with Friends and Family:   . Frequency of Social Gatherings with Friends and Family:   . Attends Religious Services:   . Active Member of Clubs or Organizations:   . Attends Archivist Meetings:   Marland Kitchen Marital Status:      Family History: The patient's family history includes Heart attack in his father and mother; Heart disease in his father and mother. There is no history of COPD or Hyperlipidemia.  ROS:   Please see the history of present illness.    ROS  All other systems  reviewed and negative.   EKGs/Labs/Other Studies Reviewed:    The following studies were reviewed today: none  EKG:  EKG is  ordered today.  The ekg ordered today demonstrates NS  Recent Labs: 09/13/2019: ALT 23; BUN 11; Creatinine 1.03; Hemoglobin 14.8; Platelet Count 223; Potassium 4.3; Sodium 141; TSH 1.662   Recent Lipid Panel    Component Value Date/Time   CHOL 125 07/18/2018 0948   CHOL 134  08/25/2017 0930   TRIG 115.0 07/18/2018 0948   HDL 39.90 07/18/2018 0948   HDL 41 08/25/2017 0930   CHOLHDL 3 07/18/2018 0948   VLDL 23.0 07/18/2018 0948   LDLCALC 62 07/18/2018 0948   LDLCALC 68 08/25/2017 0930    Physical Exam:    VS:  BP 118/80   Pulse 78   Ht 5\' 7"  (1.702 m)   Wt 202 lb 6.4 oz (91.8 kg)   SpO2 96%   BMI 31.70 kg/m     Wt Readings from Last 3 Encounters:  09/18/19 202 lb 6.4 oz (91.8 kg)  09/13/19 202 lb 8 oz (91.9 kg)  08/30/19 203 lb (92.1 kg)     GEN:  Well nourished, well developed in no acute distress HEENT: Normal NECK: No JVD; No carotid bruits LYMPHATICS: No lymphadenopathy CARDIAC: RRR, no murmurs, rubs, gallops RESPIRATORY:  Clear to auscultation without rales, wheezing or rhonchi  ABDOMEN: Soft, non-tender, non-distended MUSCULOSKELETAL:  No edema; No deformity  SKIN: Warm and dry NEUROLOGIC:  Alert and oriented x 3 PSYCHIATRIC:  Normal affect   ASSESSMENT:    1. Coronary artery disease involving native coronary artery of native heart without angina pectoris   2. Hypertension associated with diabetes ([place])   3. Hyperlipidemia associated with type 2 diabetes mellitus ([place])   4. Controlled type 2 diabetes mellitus with complication, without long-term current use of insulin (HCC)    PLAN:    In order of problems listed above:  1.  ASCAD  - cardiac cath 05/2014 showed subtotaled occluded native RCA with 99% proximal stenosis status post DES to the RCA.  He also had 50 to 70% LAD stenosis with normal perfusion on nuclear stress test in  2016.   -he denies any anginal sx -continue ASA 81mg  daily, Lopressor 25mg  BID and statin  2.  Hypertension -BP controlled -continue Lopressor 25mg  BID, HCTZ 12.5mg  daily and Losartan 50mg  daily  -outside labs reviewed from PCP and showed a Creatinine 1.03 and K+ 4.3  3.  Hyperlipidemia  -his LDL goal is less than 70.   -check FLP and ALT -continue Simvastatin 40mg  daily and Zetia 10mg  daily  5.  Diabetes type 2  -followed by PCP -managed by diet alone  Patient Risk:     Medication Adjustments/Labs and Tests Ordered: Current medicines are reviewed at length with the patient today.  Concerns regarding medicines are outlined above.  Orders Placed This Encounter  Procedures  . EKG 12-Lead   No orders of the defined types were placed in this encounter.   Signed, Fransico Him, MD  09/18/2019 8:46 AM    [place]

## 2019-09-18 ENCOUNTER — Ambulatory Visit: Payer: Medicare Other | Admitting: Cardiology

## 2019-09-18 ENCOUNTER — Encounter: Payer: Self-pay | Admitting: Cardiology

## 2019-09-18 ENCOUNTER — Other Ambulatory Visit: Payer: Self-pay

## 2019-09-18 VITALS — BP 118/80 | HR 78 | Ht 67.0 in | Wt 202.4 lb

## 2019-09-18 DIAGNOSIS — E1169 Type 2 diabetes mellitus with other specified complication: Secondary | ICD-10-CM

## 2019-09-18 DIAGNOSIS — E1159 Type 2 diabetes mellitus with other circulatory complications: Secondary | ICD-10-CM | POA: Diagnosis not present

## 2019-09-18 DIAGNOSIS — E785 Hyperlipidemia, unspecified: Secondary | ICD-10-CM | POA: Diagnosis not present

## 2019-09-18 DIAGNOSIS — I10 Essential (primary) hypertension: Secondary | ICD-10-CM | POA: Diagnosis not present

## 2019-09-18 DIAGNOSIS — I2510 Atherosclerotic heart disease of native coronary artery without angina pectoris: Secondary | ICD-10-CM

## 2019-09-18 DIAGNOSIS — E118 Type 2 diabetes mellitus with unspecified complications: Secondary | ICD-10-CM

## 2019-09-18 LAB — LIPID PANEL
Chol/HDL Ratio: 4 ratio (ref 0.0–5.0)
Cholesterol, Total: 144 mg/dL (ref 100–199)
HDL: 36 mg/dL — ABNORMAL LOW (ref >39)
LDL Chol Calc (NIH): 77 mg/dL (ref 0–99)
Triglycerides: 184 mg/dL — ABNORMAL HIGH (ref 0–149)
VLDL Cholesterol Cal: 31 mg/dL (ref 5–40)

## 2019-09-18 NOTE — Patient Instructions (Signed)
Medication Instructions:  Your physician recommends that you continue on your current medications as directed. Please refer to the Current Medication list given to you today.  *If you need a refill on your cardiac medications before your next appointment, please call your pharmacy*   Lab Work: TODAY: FLP If you have labs (blood work) drawn today and your tests are completely normal, you will receive your results only by: Marland Kitchen MyChart Message (if you have MyChart) OR . A paper copy in the mail If you have any lab test that is abnormal or we need to change your treatment, we will call you to review the results.   Follow-Up: At [facility], you and your health needs are our priority.  As part of our continuing mission to provide you with exceptional heart care, we have created designated Provider Care Teams.  These Care Teams include your primary Cardiologist (physician) and Advanced Practice Providers (APPs -  Physician Assistants and Nurse Practitioners) who all work together to provide you with the care you need, when you need it.     Your next appointment:   1 year(s)  The format for your next appointment:   In Person  Provider:   You may see Fransico Him, MD or one of the following Advanced Practice Providers on your designated Care Team:    Melina Copa, PA-C  Ermalinda Barrios, PA-C

## 2019-09-24 ENCOUNTER — Telehealth: Payer: Self-pay

## 2019-09-24 DIAGNOSIS — E785 Hyperlipidemia, unspecified: Secondary | ICD-10-CM

## 2019-09-24 DIAGNOSIS — E1169 Type 2 diabetes mellitus with other specified complication: Secondary | ICD-10-CM

## 2019-09-24 NOTE — Telephone Encounter (Signed)
-----   Message from Sueanne Margarita, MD sent at 09/23/2019  7:31 AM EDT ----- Please have him come in for repeat labs fasting  Traci ----- Message ----- From: Antonieta Iba, RN Sent: 09/21/2019   1:20 PM EDT To: Sueanne Margarita, MD  Patient had a bowl of cereal prior to lab work.

## 2019-10-05 ENCOUNTER — Telehealth: Payer: Self-pay | Admitting: Radiation Oncology

## 2019-10-05 NOTE — Telephone Encounter (Signed)
Received refill request fax for patient's Flomax. Return fax denying refill. Fax confirmation of delivery obtained.   Wrote note on return fax encouraging patient seek refill from urologist Dr. Karsten Ro.   Patient last seen in our clinic on 05/24/2019 after lung xrt. Patient completed prostate xrt in 2018.

## 2019-10-05 NOTE — Progress Notes (Signed)
Pharmacist Chemotherapy Monitoring - Follow Up Assessment    I verify that I have reviewed each item in the below checklist:  . Regimen for the patient is scheduled for the appropriate day and plan matches scheduled date. Marland Kitchen Appropriate non-routine labs are ordered dependent on drug ordered. . If applicable, additional medications reviewed and ordered per protocol based on lifetime cumulative doses and/or treatment regimen.   Plan for follow-up and/or issues identified: No . I-vent associated with next due treatment: No . MD and/or nursing notified: No  Andon Villard K 10/05/2019 9:52 AM

## 2019-10-10 ENCOUNTER — Other Ambulatory Visit: Payer: Medicare Other

## 2019-10-10 ENCOUNTER — Ambulatory Visit: Payer: Medicare Other

## 2019-10-10 ENCOUNTER — Ambulatory Visit: Payer: Medicare Other | Admitting: Internal Medicine

## 2019-10-11 ENCOUNTER — Inpatient Hospital Stay (HOSPITAL_BASED_OUTPATIENT_CLINIC_OR_DEPARTMENT_OTHER): Payer: Medicare Other | Admitting: Internal Medicine

## 2019-10-11 ENCOUNTER — Inpatient Hospital Stay: Payer: Medicare Other | Attending: Internal Medicine

## 2019-10-11 ENCOUNTER — Inpatient Hospital Stay: Payer: Medicare Other

## 2019-10-11 ENCOUNTER — Encounter: Payer: Self-pay | Admitting: Internal Medicine

## 2019-10-11 ENCOUNTER — Other Ambulatory Visit: Payer: Self-pay

## 2019-10-11 DIAGNOSIS — C3491 Malignant neoplasm of unspecified part of right bronchus or lung: Secondary | ICD-10-CM

## 2019-10-11 DIAGNOSIS — C3490 Malignant neoplasm of unspecified part of unspecified bronchus or lung: Secondary | ICD-10-CM | POA: Diagnosis not present

## 2019-10-11 DIAGNOSIS — I10 Essential (primary) hypertension: Secondary | ICD-10-CM | POA: Diagnosis not present

## 2019-10-11 DIAGNOSIS — I2510 Atherosclerotic heart disease of native coronary artery without angina pectoris: Secondary | ICD-10-CM | POA: Insufficient documentation

## 2019-10-11 DIAGNOSIS — Z79899 Other long term (current) drug therapy: Secondary | ICD-10-CM | POA: Insufficient documentation

## 2019-10-11 DIAGNOSIS — E785 Hyperlipidemia, unspecified: Secondary | ICD-10-CM | POA: Insufficient documentation

## 2019-10-11 DIAGNOSIS — Z923 Personal history of irradiation: Secondary | ICD-10-CM | POA: Insufficient documentation

## 2019-10-11 DIAGNOSIS — Z85850 Personal history of malignant neoplasm of thyroid: Secondary | ICD-10-CM | POA: Diagnosis not present

## 2019-10-11 DIAGNOSIS — C3411 Malignant neoplasm of upper lobe, right bronchus or lung: Secondary | ICD-10-CM | POA: Insufficient documentation

## 2019-10-11 DIAGNOSIS — K219 Gastro-esophageal reflux disease without esophagitis: Secondary | ICD-10-CM | POA: Insufficient documentation

## 2019-10-11 DIAGNOSIS — Z5112 Encounter for antineoplastic immunotherapy: Secondary | ICD-10-CM | POA: Insufficient documentation

## 2019-10-11 LAB — CMP (CANCER CENTER ONLY)
ALT: 18 U/L (ref 0–44)
AST: 15 U/L (ref 15–41)
Albumin: 3.6 g/dL (ref 3.5–5.0)
Alkaline Phosphatase: 65 U/L (ref 38–126)
Anion gap: 8 (ref 5–15)
BUN: 13 mg/dL (ref 8–23)
CO2: 24 mmol/L (ref 22–32)
Calcium: 8.9 mg/dL (ref 8.9–10.3)
Chloride: 106 mmol/L (ref 98–111)
Creatinine: 1.09 mg/dL (ref 0.61–1.24)
GFR, Est AFR Am: >60 mL/min (ref >60)
GFR, Estimated: >60 mL/min (ref >60)
Glucose, Bld: 158 mg/dL — ABNORMAL HIGH (ref 70–99)
Potassium: 4.2 mmol/L (ref 3.5–5.1)
Sodium: 138 mmol/L (ref 135–145)
Total Bilirubin: 0.6 mg/dL (ref 0.3–1.2)
Total Protein: 6.9 g/dL (ref 6.5–8.1)

## 2019-10-11 LAB — CBC WITH DIFFERENTIAL (CANCER CENTER ONLY)
Abs Immature Granulocytes: 0.01 10*3/uL (ref 0.00–0.07)
Basophils Absolute: 0 10*3/uL (ref 0.0–0.1)
Basophils Relative: 0 %
Eosinophils Absolute: 0.4 10*3/uL (ref 0.0–0.5)
Eosinophils Relative: 6 %
HCT: 44.4 % (ref 39.0–52.0)
Hemoglobin: 14.3 g/dL (ref 13.0–17.0)
Immature Granulocytes: 0 %
Lymphocytes Relative: 19 %
Lymphs Abs: 1.2 10*3/uL (ref 0.7–4.0)
MCH: 27.5 pg (ref 26.0–34.0)
MCHC: 32.2 g/dL (ref 30.0–36.0)
MCV: 85.4 fL (ref 80.0–100.0)
Monocytes Absolute: 0.6 10*3/uL (ref 0.1–1.0)
Monocytes Relative: 10 %
Neutro Abs: 4.1 10*3/uL (ref 1.7–7.7)
Neutrophils Relative %: 65 %
Platelet Count: 228 10*3/uL (ref 150–400)
RBC: 5.2 MIL/uL (ref 4.22–5.81)
RDW: 13.4 % (ref 11.5–15.5)
WBC Count: 6.2 10*3/uL (ref 4.0–10.5)
nRBC: 0 % (ref 0.0–0.2)

## 2019-10-11 LAB — TSH: TSH: 2.649 u[IU]/mL (ref 0.320–4.118)

## 2019-10-11 MED ORDER — SODIUM CHLORIDE 0.9 % IV SOLN
Freq: Once | INTRAVENOUS | Status: AC
  Filled 2019-10-11: qty 250

## 2019-10-11 MED ORDER — SODIUM CHLORIDE 0.9 % IV SOLN
1500.0000 mg | Freq: Once | INTRAVENOUS | Status: AC
  Administered 2019-10-11: 1500 mg via INTRAVENOUS
  Filled 2019-10-11: qty 30

## 2019-10-11 NOTE — Patient Instructions (Signed)
[facility] Discharge Instructions for Patients Receiving Chemotherapy  Today you received the following chemotherapy agents Durvalumab (IMFINZI).  To help prevent nausea and vomiting after your treatment, we encourage you to take your nausea medication as prescribed.   If you develop nausea and vomiting that is not controlled by your nausea medication, call the clinic.   BELOW ARE SYMPTOMS THAT SHOULD BE REPORTED IMMEDIATELY:  *FEVER GREATER THAN 100.5 F  *CHILLS WITH OR WITHOUT FEVER  NAUSEA AND VOMITING THAT IS NOT CONTROLLED WITH YOUR NAUSEA MEDICATION  *UNUSUAL SHORTNESS OF BREATH  *UNUSUAL BRUISING OR BLEEDING  TENDERNESS IN MOUTH AND THROAT WITH OR WITHOUT PRESENCE OF ULCERS  *URINARY PROBLEMS  *BOWEL PROBLEMS  UNUSUAL RASH Items with * indicate a potential emergency and should be followed up as soon as possible.  Feel free to call the clinic should you have any questions or concerns. The clinic phone number is [phone].  Please show the [facility] at check-in to the Emergency Department and triage nurse.

## 2019-10-11 NOTE — Progress Notes (Signed)
[facility] [phone]   [phone]  OFFICE PROGRESS NOTE  [name], MD [address] [place] [address]  DIAGNOSIS:  1) Stage IIIA (T2b, N2, M0) non-small cell lung cancer likely adenocarcinoma pending final tissue diagnosis presented with right upper lobe lung mass in addition to right hilar and mediastinal lymphadenopathy diagnosed in September 2020 2) history of prostate adenocarcinoma status post radiotherapy under the care of Dr. Tammi Klippel. 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  PRIOR THERAPY: Concurrent chemoradiation with weekly carboplatin for AUC of 2 and paclitaxel 45 mg/M2.  First dose March 05, 2019.  Status post 7 cycles.  Last dose was given April 16, 2019 with partial response.  CURRENT THERAPY: Consolidation treatment with immunotherapy with Imfinzi 1500 mg IV every 4 weeks.  First dose May 22, 2019.  Status post 5 cycles.  INTERVAL HISTORY: Carl Harrell 74 y.o. male returns to the clinic today for follow-up visit.  The patient is feeling fine today with no concerning complaints.  He denied having any chest pain, shortness of breath, cough or hemoptysis.  He denied having any fever or chills.  He has no nausea, vomiting, diarrhea or constipation but has some abdominal pain few days ago that resolved after taking an antacid.  The patient has no headache or visual changes.  He is here today for evaluation before starting cycle #6 of his consolidation treatment with immunotherapy.  MEDICAL HISTORY: Past Medical History:  Diagnosis Date  . ALLERGIC RHINITIS 01/30/2007  . Arthritis    "hands" (05/29/2014)  . Cancer ([place])    lung, prostate  . Constipation   . Coronary artery disease    LHC (05/29/14): pLAD 50-70, pRCA 99 (L>R collats), EF 55% >> PCI: 3.5 x 28 mm Xience Alpine DES to [employer]  . Cough secondary to angiotensin converting enzyme inhibitor  (ACE-I) 05/28/2013  . ED (erectile dysfunction)   . GERD 09/26/2007  . HYPERLIPIDEMIA 09/26/2007  . Hypertension   . Lung cancer ([place]) dx'd 03/2018  . PLANTAR FASCIITIS, BILATERAL 07/08/2009  . Pre-diabetes   . Prostate CA ([place]) dx'd 2017 or 2018   xrt  . Unspecified hearing loss 06/16/2009   wears aides both ears    ALLERGIES:  is allergic to lisinopril.  MEDICATIONS:  Current Outpatient Medications  Medication Sig Dispense Refill  . cetirizine (ZYRTEC) 10 MG tablet Take 1 tablet (10 mg total) by mouth daily. 30 tablet 11  . ezetimibe (ZETIA) 10 MG tablet TAKE 1 TABLET BY MOUTH EVERY DAY (Patient taking differently: Take 10 mg by mouth at bedtime. ) 90 tablet 3  . fluticasone (FLONASE) 50 MCG/ACT nasal spray SHAKE LIQUID AND USE 1 SPRAY IN EACH NOSTRIL DAILY (Patient taking differently: Place 1 spray into both nostrils daily. ) 48 g 0  . levothyroxine (SYNTHROID) 100 MCG tablet Take 1 tablet (100 mcg total) by mouth daily. 30 tablet 3  . losartan (COZAAR) 50 MG tablet Take 1 tablet (50 mg total) by mouth daily. Please keep upcoming appt in April with Dr. Radford Pax before anymore refills. Thank you 90 tablet 0  . metoprolol tartrate (LOPRESSOR) 25 MG tablet TAKE 1 TABLET(25 MG) BY MOUTH TWICE DAILY 180 tablet 5  . Multiple Vitamin (MULTIVITAMIN WITH MINERALS) TABS tablet Take 1 tablet by mouth daily.    Marland Kitchen omeprazole (PRILOSEC) 40 MG capsule TAKE 1 CAPSULE(40 MG) BY MOUTH DAILY 90 capsule 1  .  simvastatin (ZOCOR) 40 MG tablet TAKE 1 TABLET(40 MG) BY MOUTH DAILY AT 6 PM (Patient taking differently: Take 40 mg by mouth daily at 6 PM. ) 90 tablet 3  . tamsulosin (FLOMAX) 0.4 MG CAPS capsule Take 1 capsule (0.4 mg total) by mouth daily. 30 capsule 5   No current facility-administered medications for this visit.    SURGICAL HISTORY:  Past Surgical History:  Procedure Laterality Date  . CARDIAC CATHETERIZATION  05/29/2014   Procedure: CORONARY STENT INTERVENTION;  Surgeon: Sinclair Grooms, MD;   Location: [facility] C[facility] LAB;  Service: Cardiovascular;;  Prox RCA  . COLONOSCOPY    . CORONARY ANGIOPLASTY WITH STENT PLACEMENT  05/29/2014   "1"  . KNEE ARTHROSCOPY Right ~ 2005  . LEFT HEART CATHETERIZATION WITH CORONARY ANGIOGRAM N/A 05/29/2014   Procedure: LEFT HEART CATHETERIZATION WITH CORONARY ANGIOGRAM;  Surgeon: Sinclair Grooms, MD;  Location: [facility] CATH LAB;  Service: Cardiovascular;  Laterality: N/A;  . THYROIDECTOMY N/A 06/25/2019   Procedure: TOTAL THYROIDECTOMY;  Surgeon: Armandina Gemma, MD;  Location: [facility] ORS;  Service: General;  Laterality: N/A;  . VIDEO BRONCHOSCOPY WITH ENDOBRONCHIAL ULTRASOUND N/A 02/20/2019   Procedure: VIDEO BRONCHOSCOPY WITH ENDOBRONCHIAL ULTRASOUND;  Surgeon: Garner Nash, DO;  Location: [facility] OR;  Service: Thoracic;  Laterality: N/A;    REVIEW OF SYSTEMS:  A comprehensive review of systems was negative.   PHYSICAL EXAMINATION: General appearance: alert, cooperative and no distress Head: Normocephalic, without obvious abnormality, atraumatic Neck: no adenopathy, no JVD, supple, symmetrical, trachea midline and thyroid not enlarged, symmetric, no tenderness/mass/nodules Lymph nodes: Cervical, supraclavicular, and axillary nodes normal. Resp: clear to auscultation bilaterally Back: symmetric, no curvature. ROM normal. No CVA tenderness. Cardio: regular rate and rhythm, S1, S2 normal, no murmur, click, rub or gallop GI: soft, non-tender; bowel sounds normal; no masses,  no organomegaly Extremities: extremities normal, atraumatic, no cyanosis or edema  ECOG PERFORMANCE STATUS: 1 - Symptomatic but completely ambulatory  Blood pressure 121/78, pulse 86, temperature 98.1 F (36.7 C), temperature source Temporal, resp. rate 18, height 5\' 7"  (1.702 m), weight 203 lb 14.4 oz (92.5 kg), SpO2 98 %.  LABORATORY DATA: Lab Results  Component Value Date   WBC 6.2 10/11/2019   HGB 14.3 10/11/2019   HCT 44.4 10/11/2019   MCV 85.4 10/11/2019   PLT 228 10/11/2019       Chemistry      Component Value Date/Time   NA 141 09/13/2019 1102   NA 141 08/05/2016 0826   K 4.3 09/13/2019 1102   CL 106 09/13/2019 1102   CO2 25 09/13/2019 1102   BUN 11 09/13/2019 1102   BUN 14 08/05/2016 0826   CREATININE 1.03 09/13/2019 1102      Component Value Date/Time   CALCIUM 9.0 09/13/2019 1102   ALKPHOS 67 09/13/2019 1102   AST 17 09/13/2019 1102   ALT 23 09/13/2019 1102   BILITOT 0.6 09/13/2019 1102       RADIOGRAPHIC STUDIES: No results found.  ASSESSMENT AND PLAN: This is a very pleasant 74 years old white male with recently diagnosed a stage IIIa (T2b, N2, M0) non-small cell lung cancer favoring adenocarcinoma presented with right upper lobe lung mass in addition to right hilar and mediastinal lymphadenopathy diagnosed in September 2020. The patient underwent a course of concurrent chemoradiation with weekly carboplatin and paclitaxel status post 7 cycles.  He tolerated the treatment well except for fatigue and dry cough. He has partial response to this treatment.   The  patient is currently undergoing consolidation treatment with immunotherapy with Imfinzi 1500 mg IV every 4 weeks status post 5 cycles.  The patient continues to tolerate his treatment well with no concerning adverse effects. I recommended for him to proceed with cycle #6 today as planned. I will see him back for follow-up visit in 4 weeks for evaluation with repeat CT scan of the chest for restaging of his disease before starting cycle #7. For the thyroid cancer, he underwent surgical resection and he is followed by endocrinology. The patient was advised to call immediately if he has any concerning symptoms in the interval. All questions were answered. The patient knows to call the clinic with any problems, questions or concerns. We can certainly see the patient much sooner if necessary.  Disclaimer: This note was dictated with voice recognition software. Similar sounding words can inadvertently  be transcribed and may not be corrected upon review.

## 2019-10-12 ENCOUNTER — Telehealth: Payer: Self-pay | Admitting: Internal Medicine

## 2019-10-12 NOTE — Telephone Encounter (Signed)
appts already scheduled per 5/20 los - ct scan to be scheduled by central radiology

## 2019-10-16 ENCOUNTER — Telehealth: Payer: Self-pay | Admitting: Internal Medicine

## 2019-10-16 NOTE — Telephone Encounter (Signed)
Faxed medical records to [facility] at [phone], Release ID: [id number]

## 2019-10-22 ENCOUNTER — Other Ambulatory Visit: Payer: Self-pay | Admitting: Family Medicine

## 2019-10-24 ENCOUNTER — Other Ambulatory Visit: Payer: Self-pay | Admitting: *Deleted

## 2019-10-24 ENCOUNTER — Telehealth: Payer: Self-pay | Admitting: *Deleted

## 2019-10-24 DIAGNOSIS — C3491 Malignant neoplasm of unspecified part of right bronchus or lung: Secondary | ICD-10-CM

## 2019-10-24 NOTE — Telephone Encounter (Signed)
Patient called with concerns about symptom that has been on going for 2-3 weeks that has progressively gotten worse.  He reports having a progressive abdominal pain that develops over the day.  He denies diarrhea/gas/nausea/vomiting.  He does report that the pain in localized to his mid abdomen area and surrounding area described as a dull ache.  Reports pain isn't as bad when standing/walking but does worsen upon sitting and lying down. He states he was taking antiacid but stopped taking that about a week ago.  Reports having an episode of heartburn.    Gallstones on file in last imaging and he stated that he is aware of that pain when it happened in the past and this pain was not similar in nature.  Reports only taking tylenol pm at night.   Routing to Dr. Julien Nordmann to advise if he wants to advise further recommendations or if the patient should be evaluated in [facility].

## 2019-10-24 NOTE — Progress Notes (Signed)
Per Dr. Julien Nordmann patient can be evaluated by [facility] tomorrow.  Per Sandi Mealy ordered some labs for evaluation.

## 2019-10-24 NOTE — Telephone Encounter (Signed)
He can come for evaluation by the symptom management clinic.  Other option is to see his primary care physician.  Thank you.

## 2019-10-25 ENCOUNTER — Other Ambulatory Visit: Payer: Self-pay | Admitting: Family Medicine

## 2019-10-25 ENCOUNTER — Inpatient Hospital Stay: Payer: Medicare Other | Attending: Internal Medicine | Admitting: Medical

## 2019-10-25 ENCOUNTER — Other Ambulatory Visit: Payer: Self-pay

## 2019-10-25 ENCOUNTER — Telehealth: Payer: Self-pay | Admitting: Internal Medicine

## 2019-10-25 ENCOUNTER — Inpatient Hospital Stay (HOSPITAL_BASED_OUTPATIENT_CLINIC_OR_DEPARTMENT_OTHER): Payer: Medicare Other | Admitting: Medical

## 2019-10-25 VITALS — BP 120/80 | HR 63 | Temp 97.9°F | Resp 18 | Ht 67.0 in | Wt 204.3 lb

## 2019-10-25 DIAGNOSIS — Z87891 Personal history of nicotine dependence: Secondary | ICD-10-CM | POA: Diagnosis not present

## 2019-10-25 DIAGNOSIS — E785 Hyperlipidemia, unspecified: Secondary | ICD-10-CM | POA: Insufficient documentation

## 2019-10-25 DIAGNOSIS — R1013 Epigastric pain: Secondary | ICD-10-CM | POA: Diagnosis not present

## 2019-10-25 DIAGNOSIS — Z923 Personal history of irradiation: Secondary | ICD-10-CM | POA: Diagnosis not present

## 2019-10-25 DIAGNOSIS — Z8546 Personal history of malignant neoplasm of prostate: Secondary | ICD-10-CM | POA: Diagnosis not present

## 2019-10-25 DIAGNOSIS — I2510 Atherosclerotic heart disease of native coronary artery without angina pectoris: Secondary | ICD-10-CM | POA: Diagnosis not present

## 2019-10-25 DIAGNOSIS — Z79899 Other long term (current) drug therapy: Secondary | ICD-10-CM | POA: Insufficient documentation

## 2019-10-25 DIAGNOSIS — C3411 Malignant neoplasm of upper lobe, right bronchus or lung: Secondary | ICD-10-CM | POA: Diagnosis not present

## 2019-10-25 DIAGNOSIS — C61 Malignant neoplasm of prostate: Secondary | ICD-10-CM

## 2019-10-25 DIAGNOSIS — K219 Gastro-esophageal reflux disease without esophagitis: Secondary | ICD-10-CM | POA: Diagnosis not present

## 2019-10-25 DIAGNOSIS — Z8249 Family history of ischemic heart disease and other diseases of the circulatory system: Secondary | ICD-10-CM | POA: Insufficient documentation

## 2019-10-25 DIAGNOSIS — C3491 Malignant neoplasm of unspecified part of right bronchus or lung: Secondary | ICD-10-CM | POA: Diagnosis not present

## 2019-10-25 DIAGNOSIS — Z85850 Personal history of malignant neoplasm of thyroid: Secondary | ICD-10-CM | POA: Diagnosis not present

## 2019-10-25 DIAGNOSIS — C73 Malignant neoplasm of thyroid gland: Secondary | ICD-10-CM | POA: Diagnosis not present

## 2019-10-25 DIAGNOSIS — I10 Essential (primary) hypertension: Secondary | ICD-10-CM | POA: Insufficient documentation

## 2019-10-25 LAB — CBC WITH DIFFERENTIAL (CANCER CENTER ONLY)
Abs Immature Granulocytes: 0.02 10*3/uL (ref 0.00–0.07)
Basophils Absolute: 0 10*3/uL (ref 0.0–0.1)
Basophils Relative: 0 %
Eosinophils Absolute: 0.4 10*3/uL (ref 0.0–0.5)
Eosinophils Relative: 5 %
HCT: 41.8 % (ref 39.0–52.0)
Hemoglobin: 13.4 g/dL (ref 13.0–17.0)
Immature Granulocytes: 0 %
Lymphocytes Relative: 13 %
Lymphs Abs: 0.9 10*3/uL (ref 0.7–4.0)
MCH: 27.5 pg (ref 26.0–34.0)
MCHC: 32.1 g/dL (ref 30.0–36.0)
MCV: 85.7 fL (ref 80.0–100.0)
Monocytes Absolute: 0.8 10*3/uL (ref 0.1–1.0)
Monocytes Relative: 13 %
Neutro Abs: 4.6 10*3/uL (ref 1.7–7.7)
Neutrophils Relative %: 69 %
Platelet Count: 252 10*3/uL (ref 150–400)
RBC: 4.88 MIL/uL (ref 4.22–5.81)
RDW: 13.5 % (ref 11.5–15.5)
WBC Count: 6.6 10*3/uL (ref 4.0–10.5)
nRBC: 0 % (ref 0.0–0.2)

## 2019-10-25 LAB — CMP (CANCER CENTER ONLY)
ALT: 18 U/L (ref 0–44)
AST: 15 U/L (ref 15–41)
Albumin: 3.6 g/dL (ref 3.5–5.0)
Alkaline Phosphatase: 69 U/L (ref 38–126)
Anion gap: 12 (ref 5–15)
BUN: 10 mg/dL (ref 8–23)
CO2: 21 mmol/L — ABNORMAL LOW (ref 22–32)
Calcium: 8.9 mg/dL (ref 8.9–10.3)
Chloride: 108 mmol/L (ref 98–111)
Creatinine: 1.16 mg/dL (ref 0.61–1.24)
GFR, Est AFR Am: >60 mL/min (ref >60)
GFR, Estimated: >60 mL/min (ref >60)
Glucose, Bld: 135 mg/dL — ABNORMAL HIGH (ref 70–99)
Potassium: 4.5 mmol/L (ref 3.5–5.1)
Sodium: 141 mmol/L (ref 135–145)
Total Bilirubin: 0.6 mg/dL (ref 0.3–1.2)
Total Protein: 6.8 g/dL (ref 6.5–8.1)

## 2019-10-25 LAB — AMYLASE: Amylase: 61 U/L (ref 28–100)

## 2019-10-25 LAB — LIPASE, BLOOD: Lipase: 28 U/L (ref 11–51)

## 2019-10-25 MED ORDER — LEVOTHYROXINE SODIUM 100 MCG PO TABS: 100.0000 ug | ORAL_TABLET | Freq: Every day | ORAL | 3 refills | Status: DC

## 2019-10-25 NOTE — Progress Notes (Signed)
These preliminary result these preliminary results were noted.  Awaiting final report.

## 2019-10-25 NOTE — Telephone Encounter (Signed)
Medication Refill Request  Did you call your pharmacy and request this refill first? Yes  . If patient has not contacted pharmacy first, instruct them to do so for future refills.  . Remind them that contacting the pharmacy for their refill is the quickest method to get the refill.  . Refill policy also stated that it will take anywhere between 24-72 hours to receive the refill.    Name of medication? Levothyroxine - Dr Harlow Asa has asked patient to have Dr Cruzita Lederer fill this Rx.  Is this a 90 day supply? yes  Name and location of pharmacy?  [facility] [place], [address] [place] Phone:  [phone]  Fax:  [phone]

## 2019-10-25 NOTE — Telephone Encounter (Signed)
RX has been sent.

## 2019-10-26 NOTE — Progress Notes (Signed)
[facility] Progress Note   Carl Harrell [id number] December 25, 1945 74 y.o.  Carl Harrell is managed by Dr. Fanny Bien. Carl Harrell  Actively treated with chemotherapy/immunotherapy/hormonal therapy: yes  Current therapy: Imfinzi  Last treated: 10/11/2019 (cycle 6, day 1)  Next scheduled appointment with provider: 11/07/2019  Assessment: Plan:    Adenocarcinoma of right lung, stage 3 (HCC)  Primary thyroid follicular carcinoma (HCC)  Prostate cancer ([place])  Epigastric pain   Stage III adenocarcinoma of the right lung: The patient is status post cycle 6, day 1 of Imfinzi which was dosed on 10/11/2019.  He is scheduled to have a restaging CT scan completed on 11/05/2019 and to be seen by Dr. Julien Nordmann in follow-up on 11/07/2019.  Primary thyroid follicular carcinoma and prostate cancer: Mr. Carl Harrell is being followed with conservative surveillance for these diagnoses.  Epigastric pain: Mr. Carl Harrell was told to increase his Prilosec from 40 mg once daily to 40 mg twice daily for the next 7 days then to decrease it back to 40 mg once daily.  He has been taking this episodically.  I have asked that he take it regularly.  He also reports he has an increase consumption of dairy products recently.  I asked that he limit his dairy products for the next 7 days.  He is to call next week with an update.  Should his abdominal pain continued then it would be requested that an abdominal CT scan be added to his upcoming CT scans.  Please see After Visit Summary for patient specific instructions.  Future Appointments  Date Time Provider [place]  11/05/2019  7:30 AM [facility] [place]  11/07/2019  9:15 AM [facility] LAB 5 [facility] None  11/07/2019  9:45 AM Curt Bears, MD [facility] None  11/07/2019 11:00 AM [facility] INFUSION [facility] None  12/06/2019 10:45 AM [facility] LAB 6 [facility] None  12/06/2019 11:15 AM Curt Bears, MD [facility] None    12/06/2019 12:30 PM [facility] INFUSION [facility] None  12/11/2019  9:20 AM Philemon Kingdom, MD [facility] None    No orders of the defined types were placed in this encounter.      Subjective:   Patient ID:  Carl Harrell is a 74 y.o. (DOB [birth year]) male.  Chief Complaint: No chief complaint on file.   HPI Carl Harrell is a 74 y.o. male with a diagnosis of stage III adenocarcinoma of the right lung.  He is managed by Dr. Julien Nordmann and is status post cycle 6, day 1 of Imfinzi which was dosed on 10/11/2019.  He has a restaging CT scan scheduled for 11/05/2019.  He will see Dr. Julien Nordmann following the study.  He presents today reporting that he has had diffuse abdominal pain over the past 1 to 2 weeks.  He describes the pain as being mainly in his epigastrium.  He states that he is eating more dairy products recently he had at least 2 days where he tried to decrease the amount of dairy products that he was consuming and noted that his pain was better.  He is also taking Prilosec intermittently.  His prescription for Prilosec is 40 mg once daily.  He has had no loss of appetite or unexplained weight loss.  Of note he has a history of a thyroid cancer and prostate cancer.  He reports a history of gallstones.  Medications: I have reviewed the patient's current medications.  Allergies:  Allergies  Allergen Reactions  . Lisinopril Cough  Past Medical History:  Diagnosis Date  . ALLERGIC RHINITIS 01/30/2007  . Arthritis    "hands" (05/29/2014)  . Cancer ([place])    lung, prostate  . Constipation   . Coronary artery disease    LHC (05/29/14): pLAD 50-70, pRCA 99 (L>R collats), EF 55% >> PCI: 3.5 x 28 mm Xience [employer]pine DES to [employer]  . Cough secondary to angiotensin converting enzyme inhibitor (ACE-I) 05/28/2013  . ED (erectile dysfunction)   . GERD 09/26/2007  . HYPERLIPIDEMIA 09/26/2007  . Hypertension   . Lung cancer ([place]) dx'd 03/2018  . PLANTAR FASCIITIS, BILATERAL 07/08/2009  .  Pre-diabetes   . Prostate CA ([place]) dx'd 2017 or 2018   xrt  . Unspecified hearing loss 06/16/2009   wears aides both ears    Past Surgical History:  Procedure Laterality Date  . CARDIAC CATHETERIZATION  05/29/2014   Procedure: CORONARY STENT INTERVENTION;  Surgeon: Sinclair Grooms, MD;  Location: [facility] CATH LAB;  Service: Cardiovascular;;  Prox RCA  . COLONOSCOPY    . CORONARY ANGIOPLASTY WITH STENT PLACEMENT  05/29/2014   "1"  . KNEE ARTHROSCOPY Right ~ 2005  . LEFT HEART CATHETERIZATION WITH CORONARY ANGIOGRAM N/A 05/29/2014   Procedure: LEFT HEART CATHETERIZATION WITH CORONARY ANGIOGRAM;  Surgeon: Sinclair Grooms, MD;  Location: [facility] CATH LAB;  Service: Cardiovascular;  Laterality: N/A;  . THYROIDECTOMY N/A 06/25/2019   Procedure: TOTAL THYROIDECTOMY;  Surgeon: Armandina Gemma, MD;  Location: [facility] ORS;  Service: General;  Laterality: N/A;  . VIDEO BRONCHOSCOPY WITH ENDOBRONCHIAL ULTRASOUND N/A 02/20/2019   Procedure: VIDEO BRONCHOSCOPY WITH ENDOBRONCHIAL ULTRASOUND;  Surgeon: Garner Nash, DO;  Location: [facility] OR;  Service: Thoracic;  Laterality: N/A;    Family History  Problem Relation Age of Onset  . Heart attack Mother   . Heart disease Mother   . Heart attack Father   . Heart disease Father   . COPD Neg Hx        family hx  . Hyperlipidemia Neg Hx        family hx    Social History   Socioeconomic History  . Marital status: Married    Spouse name: Not on file  . Number of children: Not on file  . Years of education: Not on file  . Highest education level: Not on file  Occupational History  . Not on file  Tobacco Use  . Smoking status: Former Smoker    Packs/day: 0.20    Years: 15.00    Pack years: 3.00    Types: Cigarettes    Quit date: 05/24/1978    Years since quitting: 41.4  . Smokeless tobacco: Never Used  . Tobacco comment: 05/29/2014 "smoked socially; nothing since 1980"  Substance and Sexual Activity  . Alcohol use: Yes    Alcohol/week: 2.0 standard drinks     Types: 2 Shots of liquor per week    Comment: beer or two a week  . Drug use: No  . Sexual activity: Not Currently  Other Topics Concern  . Not on file  Social History Narrative   Lives with wife in a one story home.  Has 2 children.  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  Education: 3 years of college.    Does wood working.    Social Determinants of Health   Financial Resource Strain:   . Difficulty of Paying Living Expenses:   Food Insecurity:   . Worried About Charity fundraiser in the Last Year:   . [employer]  of Food in the Last Year:   Transportation Needs:   . Film/video editor (Medical):   Marland Kitchen Lack of Transportation (Non-Medical):   Physical Activity:   . Days of Exercise per Week:   . Minutes of Exercise per Session:   Stress:   . Feeling of Stress :   Social Connections:   . Frequency of Communication with Friends and Family:   . Frequency of Social Gatherings with Friends and Family:   . Attends Religious Services:   . Active Member of Clubs or Organizations:   . Attends Archivist Meetings:   Marland Kitchen Marital Status:   Intimate Partner Violence:   . Fear of Current or Ex-Partner:   . Emotionally Abused:   Marland Kitchen Physically Abused:   . Sexually Abused:     Past Medical History, Surgical history, Social history, and Family history were reviewed and updated as appropriate.   Please see review of systems for further details on the patient's review from today.   Review of Systems:  Review of Systems  Constitutional: Negative for activity change, appetite change, chills, diaphoresis, fatigue, fever and unexpected weight change.  HENT: Negative for trouble swallowing.   Respiratory: Negative for cough, chest tightness and shortness of breath.   Cardiovascular: Negative for chest pain, palpitations and leg swelling.  Gastrointestinal: Positive for abdominal pain. Negative for abdominal distention, constipation, diarrhea, nausea and vomiting.  Genitourinary: Negative for  difficulty urinating and dysuria.  Neurological: Negative for dizziness.    Objective:   Physical Exam:  BP 120/80 (BP Location: Left Arm, Patient Position: Sitting)   Pulse 63   Temp 97.9 F (36.6 C) (Temporal)   Resp 18   Ht 5\' 7"  (1.702 m)   Wt 92.7 kg (204 lb 4.8 oz)   SpO2 97%   BMI 32.00 kg/m  ECOG: 0  Physical Exam Constitutional:      General: He is not in acute distress.    Appearance: He is not diaphoretic.  HENT:     Head: Normocephalic and atraumatic.  Eyes:     General: No scleral icterus.       Right eye: No discharge.        Left eye: No discharge.     Conjunctiva/sclera: Conjunctivae normal.  Cardiovascular:     Rate and Rhythm: Normal rate and regular rhythm.     Heart sounds: Normal heart sounds. No murmur. No friction rub. No gallop.   Pulmonary:     Effort: Pulmonary effort is normal. No respiratory distress.     Breath sounds: Normal breath sounds. No wheezing or rales.  Abdominal:     General: Bowel sounds are normal. There is no distension.     Palpations: There is no mass.     Tenderness: There is no abdominal tenderness. There is no guarding or rebound.     Hernia: No hernia is present.     Comments: Negative Murphy's sign  Musculoskeletal:     Cervical back: Normal range of motion and neck supple. No rigidity or tenderness.  Lymphadenopathy:     Cervical: No cervical adenopathy.  Skin:    General: Skin is warm and dry.     Findings: No erythema or rash.  Neurological:     Mental Status: He is alert.     Coordination: Coordination normal.     Gait: Gait normal.  Psychiatric:        Mood and Affect: Mood normal.        Behavior: Behavior  normal.        Thought Content: Thought content normal.        Judgment: Judgment normal.     Lab Review:     Component Value Date/Time   NA 141 10/25/2019 0825   NA 141 08/05/2016 0826   K 4.5 10/25/2019 0825   CL 108 10/25/2019 0825   CO2 21 (L) 10/25/2019 0825   GLUCOSE 135 (H) 10/25/2019  0825   BUN 10 10/25/2019 0825   BUN 14 08/05/2016 0826   CREATININE 1.16 10/25/2019 0825   CALCIUM 8.9 10/25/2019 0825   PROT 6.8 10/25/2019 0825   PROT 6.7 08/25/2017 0930   ALBUMIN 3.6 10/25/2019 0825   ALBUMIN 4.3 08/25/2017 0930   AST 15 10/25/2019 0825   ALT 18 10/25/2019 0825   ALKPHOS 69 10/25/2019 0825   BILITOT 0.6 10/25/2019 0825   GFRNONAA >60 10/25/2019 0825   GFRAA >60 10/25/2019 0825       Component Value Date/Time   WBC 6.6 10/25/2019 0825   WBC 4.8 06/21/2019 1206   RBC 4.88 10/25/2019 0825   HGB 13.4 10/25/2019 0825   HCT 41.8 10/25/2019 0825   PLT 252 10/25/2019 0825   MCV 85.7 10/25/2019 0825   MCH 27.5 10/25/2019 0825   MCHC 32.1 10/25/2019 0825   RDW 13.5 10/25/2019 0825   LYMPHSABS 0.9 10/25/2019 0825   MONOABS 0.8 10/25/2019 0825   EOSABS 0.4 10/25/2019 0825   BASOSABS 0.0 10/25/2019 0825   -------------------------------  Imaging from last 24 hours (if applicable):  Radiology interpretation: No results found.      This case was discussed with Dr. Julien Nordmann. He expressed agreement with my management of this patient.

## 2019-10-29 ENCOUNTER — Telehealth: Payer: Self-pay | Admitting: Emergency Medicine

## 2019-10-29 NOTE — Telephone Encounter (Signed)
Returning pt's VM on triage/[facility] line.  Pt seen in [facility] on 10/25/19 and is following up as instructed by PA Lucianne Lei on his symptoms.  Pt reporting three episodes of dark stools, loss of appetite, and uneasy/bloated feeling in his stomach.  Pt denies any dizziness or weakness or increased SOB/DOE or N/V/D.  Pt denies any other new symptoms or pain.  Hx hemorroids.  Pt concerned he has stomach cancer now and would like to be evaluated for this.  Spoke with PA Lucianne Lei who will be speaking with Dr. Julien Nordmann to add Abdominal scan to upcoming CT scan of Chest to investigate symptoms, and to move up the scan appts to a sooner time/date.  Pt made aware of this and instructed to monitor his stools & for any changes or new symptoms and to f/u with [facility] as needed.  Pt verbalized understanding and denies any further questions/concerns at this time, aware to expect call from scheduling regarding new scan appt date/time.

## 2019-10-30 ENCOUNTER — Other Ambulatory Visit: Payer: Self-pay | Admitting: Medical

## 2019-10-30 DIAGNOSIS — C3491 Malignant neoplasm of unspecified part of right bronchus or lung: Secondary | ICD-10-CM

## 2019-10-30 DIAGNOSIS — C73 Malignant neoplasm of thyroid gland: Secondary | ICD-10-CM

## 2019-10-30 DIAGNOSIS — C61 Malignant neoplasm of prostate: Secondary | ICD-10-CM

## 2019-10-30 DIAGNOSIS — R1084 Generalized abdominal pain: Secondary | ICD-10-CM

## 2019-11-01 ENCOUNTER — Other Ambulatory Visit: Payer: Self-pay | Admitting: *Deleted

## 2019-11-01 DIAGNOSIS — R1084 Generalized abdominal pain: Secondary | ICD-10-CM

## 2019-11-05 ENCOUNTER — Inpatient Hospital Stay (HOSPITAL_BASED_OUTPATIENT_CLINIC_OR_DEPARTMENT_OTHER): Payer: Medicare Other | Admitting: Medical

## 2019-11-05 ENCOUNTER — Ambulatory Visit (HOSPITAL_COMMUNITY)
Admission: RE | Admit: 2019-11-05 | Discharge: 2019-11-05 | Disposition: A | Discharge status: Home / Self Care | Payer: Medicare Other | Source: Ambulatory Visit | Attending: Internal Medicine | Admitting: Internal Medicine

## 2019-11-05 ENCOUNTER — Other Ambulatory Visit: Payer: Self-pay

## 2019-11-05 ENCOUNTER — Encounter (HOSPITAL_COMMUNITY): Payer: Self-pay

## 2019-11-05 DIAGNOSIS — C3490 Malignant neoplasm of unspecified part of unspecified bronchus or lung: Secondary | ICD-10-CM | POA: Insufficient documentation

## 2019-11-05 DIAGNOSIS — R1013 Epigastric pain: Secondary | ICD-10-CM | POA: Diagnosis not present

## 2019-11-05 DIAGNOSIS — E785 Hyperlipidemia, unspecified: Secondary | ICD-10-CM | POA: Diagnosis not present

## 2019-11-05 DIAGNOSIS — N1330 Unspecified hydronephrosis: Secondary | ICD-10-CM | POA: Diagnosis not present

## 2019-11-05 DIAGNOSIS — R1084 Generalized abdominal pain: Secondary | ICD-10-CM | POA: Insufficient documentation

## 2019-11-05 DIAGNOSIS — Z85850 Personal history of malignant neoplasm of thyroid: Secondary | ICD-10-CM | POA: Diagnosis not present

## 2019-11-05 DIAGNOSIS — I10 Essential (primary) hypertension: Secondary | ICD-10-CM | POA: Diagnosis not present

## 2019-11-05 DIAGNOSIS — R1907 Generalized intra-abdominal and pelvic swelling, mass and lump: Secondary | ICD-10-CM

## 2019-11-05 DIAGNOSIS — K219 Gastro-esophageal reflux disease without esophagitis: Secondary | ICD-10-CM | POA: Diagnosis not present

## 2019-11-05 DIAGNOSIS — Z87891 Personal history of nicotine dependence: Secondary | ICD-10-CM | POA: Diagnosis not present

## 2019-11-05 DIAGNOSIS — I2510 Atherosclerotic heart disease of native coronary artery without angina pectoris: Secondary | ICD-10-CM | POA: Diagnosis not present

## 2019-11-05 DIAGNOSIS — K8020 Calculus of gallbladder without cholecystitis without obstruction: Secondary | ICD-10-CM | POA: Diagnosis not present

## 2019-11-05 DIAGNOSIS — Z79899 Other long term (current) drug therapy: Secondary | ICD-10-CM | POA: Diagnosis not present

## 2019-11-05 DIAGNOSIS — Z923 Personal history of irradiation: Secondary | ICD-10-CM | POA: Diagnosis not present

## 2019-11-05 DIAGNOSIS — Z8249 Family history of ischemic heart disease and other diseases of the circulatory system: Secondary | ICD-10-CM | POA: Diagnosis not present

## 2019-11-05 DIAGNOSIS — C3411 Malignant neoplasm of upper lobe, right bronchus or lung: Secondary | ICD-10-CM | POA: Diagnosis not present

## 2019-11-05 MED ORDER — OMEPRAZOLE 40 MG PO CPDR: 40.0000 mg | DELAYED_RELEASE_CAPSULE | Freq: Two times a day (BID) | ORAL | 3 refills | Status: AC

## 2019-11-05 MED ORDER — IOHEXOL 300 MG/ML  SOLN
100.0000 mL | Freq: Once | INTRAMUSCULAR | Status: AC | PRN
  Administered 2019-11-05: 100 mL via INTRAVENOUS

## 2019-11-05 MED ORDER — SODIUM CHLORIDE (PF) 0.9 % IJ SOLN
INTRAMUSCULAR | Status: AC
  Filled 2019-11-05: qty 50

## 2019-11-05 NOTE — Patient Instructions (Signed)

## 2019-11-06 NOTE — Progress Notes (Signed)
[facility] Progress Note   [name] [id number] Nov 29, 1945 74 y.o.  Carl Harrell is managed by Dr. Fanny Bien. Mohamed  Actively treated with chemotherapy/immunotherapy/hormonal therapy: yes  Current therapy: Imfinzi  Last treated: 10/11/2019 (cycle 6, day 1)  Next scheduled appointment with provider: to be scheduled  Assessment: Plan:    Generalized intra-abdominal and pelvic swelling, mass and lump - Plan: NM PET Image Restage (PS) Whole Body  Epigastric pain - Plan: omeprazole (PRILOSEC) 40 MG capsule   Abdominal pain in the setting of histories of adenocarcinoma of right lung, stage 3, primary thyroid follicular carcinoma, and prostate cancer: Carl Harrell presents to the clinic today and is seen with Dr. Julien Nordmann.  He is status post a restaging CT scan of the chest abdomen pelvis which returned showing:  IMPRESSION: 1. Within the right suprahilar scarring, there has been interval development of a 2.9 x 2.5 cm central masslike component highly concerning for recurrent disease. PET-CT recommended to further evaluate. 2. 5.9 x 4.4 x 5.9 cm necrotic mesenteric mass in the anterior left abdomen, intimately associated with a small bowel loop that stretches around the periphery of the lesion. Lesion causes no obstruction at this time. Metastatic disease would be a consideration. Small bowel primary including lymphoma would also be a consideration although considered less likely. 3. 2.9 x 1.9 cm ill-defined necrotic central mesenteric lymph node, consistent with metastatic disease. 4. Cholelithiasis. 5. Left colonic diverticulosis without diverticulitis. 6. Prostatomegaly. 7. Aortic Atherosclerosis   The patient was referred for a PET scan.  We will await these results.  Dr. Julien Nordmann will schedule a follow-up with the patient once this study is available.  Plans are to proceed with a biopsy of an accessible lesion should his PET scan returned positive.  Please  see After Visit Summary for patient specific instructions.  Future Appointments  Date Time Provider [place]  11/07/2019  9:15 AM [facility] LAB 5 [facility] None  11/07/2019  9:45 AM Curt Bears, MD [facility] None  11/07/2019 11:00 AM [facility] INFUSION [facility] None  12/06/2019 10:45 AM [facility] LAB 6 [facility] None  12/06/2019 11:15 AM Curt Bears, MD [facility] None  12/06/2019 12:30 PM [facility] INFUSION [facility] None  12/11/2019  9:20 AM Philemon Kingdom, MD [facility] None    Orders Placed This Encounter  Procedures  . NM PET Image Restage (PS) Whole Body       Subjective:   Patient ID:  Carl Harrell is a 74 y.o. (DOB 1945-10-02) male.  Chief Complaint:  Chief Complaint  Patient presents with  . Blood In Stools    HPI [name]  is a 74 y.o. male with a history of an adenocarcinoma of right lung, stage 3, primary thyroid follicular carcinoma, and prostate cancer.  He was seen most recently on 10/25/2019 when he reported new onset of abdominal pain.  He was told to increase Prilosec to 40 mg twice daily at that time.  He is done this but continues to have abdominal pain.  He had a restaging CT scan scheduled for earlier today to which an abdominal and pelvic CT scan had been added with results returning as follows:  FINDINGS: CT CHEST FINDINGS  Cardiovascular: The heart size is normal. No substantial pericardial effusion. Coronary artery calcification is evident. Atherosclerotic calcification is noted in the wall of the thoracic aorta.  Mediastinum/Nodes: No mediastinal lymphadenopathy. There is no hilar lymphadenopathy. There is no axillary lymphadenopathy. The esophagus has normal imaging features.  Lungs/Pleura:  Collapse/consolidative opacity in the suprahilar right upper lobe again noted, compatible with previous radiation therapy. Interval development of a central masslike component measuring 2.9 x 2.5 cm on image 47/6  displaces adjacent airways and is highly concerning for recurrent disease.  No change in lingular scarring. The tiny left perifissural nodule seen on image 70/6 today is stable. No pleural effusion.  Musculoskeletal: No worrisome lytic or sclerotic osseous abnormality.  CT ABDOMEN PELVIS FINDINGS  Hepatobiliary: Peripheral low-density wedge-shaped lesion in the posterior right liver with associated dystrophic calcification is stable. Subtle differential attenuation in the inferior liver may be related to fatty deposition. Tiny calcified gallstone evident. No intrahepatic or extrahepatic biliary dilation.  Pancreas: No focal mass lesion. No dilatation of the main duct. No intraparenchymal cyst. No peripancreatic edema.  Spleen: No splenomegaly. No focal mass lesion.  Adrenals/Urinary Tract: Similar appearance mild adrenal thickening. Right kidney unremarkable. Interpolar left renal cyst noted. No evidence for hydroureter. Subtle irregularity of the bladder wall is nonspecific but may be related to trabeculation.  Stomach/Bowel: Tiny hiatal hernia. Stomach otherwise unremarkable. Duodenum is normally positioned as is the ligament of Treitz. No small bowel wall thickening. No small bowel dilatation. The terminal ileum is normal. The appendix is normal. No gross colonic mass. No colonic wall thickening. Diverticular changes are noted in the left colon without evidence of diverticulitis.  Vascular/Lymphatic: There is abdominal aortic atherosclerosis without aneurysm. No hepatoduodenal ligament or gastrohepatic ligament lymphadenopathy. 2.9 x 1.9 cm ill-defined necrotic central mesenteric lymph node visible in the left paramidline abdomen (image 74/series 2). A second necrotic mesenteric mass is identified in the anterior left abdomen, measuring 5.9 x 4.4 x 5.9 cm on image 79/2. This particular mass is intimately associated with a small bowel loop that stretches around the periphery of the  lesion. It is unclear whether this is mesenteric in origin or related to the small bowel.  No pelvic sidewall lymphadenopathy.  Reproductive: Prostate gland is enlarged with fiducial markers evident.  Other: No intraperitoneal free fluid.  Musculoskeletal: No worrisome lytic or sclerotic osseous abnormality.  IMPRESSION: 1. Within the right suprahilar scarring, there has been interval development of a 2.9 x 2.5 cm central masslike component highly concerning for recurrent disease. PET-CT recommended to further evaluate.  2. 5.9 x 4.4 x 5.9 cm necrotic mesenteric mass in the anterior left abdomen, intimately associated with a small bowel loop that stretches around the periphery of the lesion. Lesion causes no obstruction at this time. Metastatic disease would be a consideration. Small bowel primary including lymphoma would also be a consideration although considered less likely.  3. 2.9 x 1.9 cm ill-defined necrotic central mesenteric lymph node, consistent with metastatic disease.  4. Cholelithiasis.  5. Left colonic diverticulosis without diverticulitis.  6. Prostatomegaly.  7. Aortic Atherosclerosis (ICD10-I70.0).  Medications: I have reviewed the patient's current medications.  Allergies:  Allergies  Allergen Reactions  . Lisinopril Cough    Past Medical History:  Diagnosis Date  . ALLERGIC RHINITIS 01/30/2007  . Arthritis    "hands" (05/29/2014)  . Cancer ([place])    lung, prostate  . Constipation   . Coronary artery disease    LHC (05/29/14): pLAD 50-70, pRCA 99 (L>R collats), EF 55% >> PCI: 3.5 x 28 mm Xience Alpine DES to [employer]  . Cough secondary to angiotensin converting enzyme inhibitor (ACE-I) 05/28/2013  . ED (erectile dysfunction)   . GERD 09/26/2007  . HYPERLIPIDEMIA 09/26/2007  . Hypertension   . Lung cancer ([place]) dx'd 03/2018  .  PLANTAR FASCIITIS, BILATERAL 07/08/2009  . Pre-diabetes   . Prostate CA ([place]) dx'd 2017 or 2018   xrt  . Unspecified hearing loss  06/16/2009   wears aides both ears    Past Surgical History:  Procedure Laterality Date  . CARDIAC CATHETERIZATION  05/29/2014   Procedure: CORONARY STENT INTERVENTION;  Surgeon: Sinclair Grooms, MD;  Location: [facility] CATH LAB;  Service: Cardiovascular;;  Prox RCA  . COLONOSCOPY    . CORONARY ANGIOPLASTY WITH STENT PLACEMENT  05/29/2014   "1"  . KNEE ARTHROSCOPY Right ~ 2005  . LEFT HEART CATHETERIZATION WITH CORONARY ANGIOGRAM N/A 05/29/2014   Procedure: LEFT HEART CATHETERIZATION WITH CORONARY ANGIOGRAM;  Surgeon: Sinclair Grooms, MD;  Location: [facility] CATH LAB;  Service: Cardiovascular;  Laterality: N/A;  . THYROIDECTOMY N/A 06/25/2019   Procedure: TOTAL THYROIDECTOMY;  Surgeon: Armandina Gemma, MD;  Location: [facility] ORS;  Service: General;  Laterality: N/A;  . VIDEO BRONCHOSCOPY WITH ENDOBRONCHIAL ULTRASOUND N/A 02/20/2019   Procedure: VIDEO BRONCHOSCOPY WITH ENDOBRONCHIAL ULTRASOUND;  Surgeon: Garner Nash, DO;  Location: [facility] OR;  Service: Thoracic;  Laterality: N/A;    Family History  Problem Relation Age of Onset  . Heart attack Mother   . Heart disease Mother   . Heart attack Father   . Heart disease Father   . COPD Neg Hx        family hx  . Hyperlipidemia Neg Hx        family hx    Social History   Socioeconomic History  . Marital status: Married    Spouse name: Not on file  . Number of children: Not on file  . Years of education: Not on file  . Highest education level: Not on file  Occupational History  . Not on file  Tobacco Use  . Smoking status: Former Smoker    Packs/day: 0.20    Years: 15.00    Pack years: 3.00    Types: Cigarettes    Quit date: 05/24/1978    Years since quitting: 41.4  . Smokeless tobacco: Never Used  . Tobacco comment: 05/29/2014 "smoked socially; nothing since 1980"  Vaping Use  . Vaping Use: Never used  Substance and Sexual Activity  . Alcohol use: Yes    Alcohol/week: 2.0 standard drinks    Types: 2 Shots of liquor per week    Comment: beer or  two a week  . Drug use: No  . Sexual activity: Not Currently  Other Topics Concern  . Not on file  Social History Narrative   Lives with wife in a one story home.  Has 2 children.  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  Education: 3 years of college.    Does wood working.    Social Determinants of Health   Financial Resource Strain:   . Difficulty of Paying Living Expenses:   Food Insecurity:   . Worried About Charity fundraiser in the Last Year:   . Arboriculturist in the Last Year:   Transportation Needs:   . Film/video editor (Medical):   Marland Kitchen Lack of Transportation (Non-Medical):   Physical Activity:   . Days of Exercise per Week:   . Minutes of Exercise per Session:   Stress:   . Feeling of Stress :   Social Connections:   . Frequency of Communication with Friends and Family:   . Frequency of Social Gatherings with Friends and Family:   . Attends Religious Services:   . Active Member  of Clubs or Organizations:   . Attends Archivist Meetings:   Marland Kitchen Marital Status:   Intimate Partner Violence:   . Fear of Current or Ex-Partner:   . Emotionally Abused:   Marland Kitchen Physically Abused:   . Sexually Abused:     Past Medical History, Surgical history, Social history, and Family history were reviewed and updated as appropriate.   Please see review of systems for further details on the patient's review from today.   Review of Systems:  Review of Systems  Constitutional: Negative for activity change, appetite change, chills, diaphoresis and fever.  HENT: Negative for trouble swallowing.   Respiratory: Negative for cough, chest tightness and shortness of breath.   Cardiovascular: Negative for chest pain, palpitations and leg swelling.  Gastrointestinal: Positive for abdominal pain. Negative for abdominal distention, constipation, diarrhea, nausea and vomiting.    Objective:   Physical Exam:  BP 111/76 (BP Location: Left Arm, Patient Position: Sitting)   Pulse 100   Temp 97.9 F  (36.6 C) (Temporal)   Resp 18   Ht 5\' 7"  (1.702 m)   Wt 202 lb 8 oz (91.9 kg)   SpO2 99%   BMI 31.72 kg/m  ECOG: 0  Physical Exam Constitutional:      General: He is not in acute distress.    Appearance: He is not diaphoretic.  HENT:     Head: Normocephalic and atraumatic.  Eyes:     General: No scleral icterus.       Right eye: No discharge.        Left eye: No discharge.     Conjunctiva/sclera: Conjunctivae normal.  Cardiovascular:     Rate and Rhythm: Normal rate and regular rhythm.     Heart sounds: Normal heart sounds. No murmur heard.  No friction rub. No gallop.   Pulmonary:     Effort: Pulmonary effort is normal. No respiratory distress.     Breath sounds: Normal breath sounds. No wheezing or rales.  Abdominal:     General: Bowel sounds are normal. There is no distension.     Tenderness: There is abdominal tenderness. There is no guarding.  Skin:    General: Skin is warm and dry.     Findings: No erythema or rash.  Neurological:     Mental Status: He is alert.     Coordination: Coordination normal.     Gait: Gait normal.  Psychiatric:        Mood and Affect: Mood normal.        Behavior: Behavior normal.        Thought Content: Thought content normal.        Judgment: Judgment normal.     Lab Review:     Component Value Date/Time   NA 141 10/25/2019 0825   NA 141 08/05/2016 0826   K 4.5 10/25/2019 0825   CL 108 10/25/2019 0825   CO2 21 (L) 10/25/2019 0825   GLUCOSE 135 (H) 10/25/2019 0825   BUN 10 10/25/2019 0825   BUN 14 08/05/2016 0826   CREATININE 1.16 10/25/2019 0825   CALCIUM 8.9 10/25/2019 0825   PROT 6.8 10/25/2019 0825   PROT 6.7 08/25/2017 0930   ALBUMIN 3.6 10/25/2019 0825   ALBUMIN 4.3 08/25/2017 0930   AST 15 10/25/2019 0825   ALT 18 10/25/2019 0825   ALKPHOS 69 10/25/2019 0825   BILITOT 0.6 10/25/2019 0825   GFRNONAA >60 10/25/2019 0825   GFRAA >60 10/25/2019 0825  Component Value Date/Time   WBC 6.6 10/25/2019 0825    WBC 4.8 06/21/2019 1206   RBC 4.88 10/25/2019 0825   HGB 13.4 10/25/2019 0825   HCT 41.8 10/25/2019 0825   PLT 252 10/25/2019 0825   MCV 85.7 10/25/2019 0825   MCH 27.5 10/25/2019 0825   MCHC 32.1 10/25/2019 0825   RDW 13.5 10/25/2019 0825   LYMPHSABS 0.9 10/25/2019 0825   MONOABS 0.8 10/25/2019 0825   EOSABS 0.4 10/25/2019 0825   BASOSABS 0.0 10/25/2019 0825   -------------------------------  Imaging from last 24 hours (if applicable):  Radiology interpretation: CT Chest W Contrast  Result Date: 11/05/2019 CLINICAL DATA:  Non-small-cell lung cancer.  Restaging. EXAM: CT CHEST, ABDOMEN, AND PELVIS WITH CONTRAST TECHNIQUE: Multidetector CT imaging of the chest, abdomen and pelvis was performed following the standard protocol during bolus administration of intravenous contrast. CONTRAST:  110mL OMNIPAQUE IOHEXOL 300 MG/ML  SOLN COMPARISON:  Chest CT 08/10/2019. FINDINGS: CT CHEST FINDINGS Cardiovascular: The heart size is normal. No substantial pericardial effusion. Coronary artery calcification is evident. Atherosclerotic calcification is noted in the wall of the thoracic aorta. Mediastinum/Nodes: No mediastinal lymphadenopathy. There is no hilar lymphadenopathy. There is no axillary lymphadenopathy. The esophagus has normal imaging features. Lungs/Pleura: Collapse/consolidative opacity in the suprahilar right upper lobe again noted, compatible with previous radiation therapy. Interval development of a central masslike component measuring 2.9 x 2.5 cm on image 47/6 displaces adjacent airways and is highly concerning for recurrent disease. No change in lingular scarring. The tiny left perifissural nodule seen on image 70/6 today is stable. No pleural effusion. Musculoskeletal: No worrisome lytic or sclerotic osseous abnormality. CT ABDOMEN PELVIS FINDINGS Hepatobiliary: Peripheral low-density wedge-shaped lesion in the posterior right liver with associated dystrophic calcification is stable.  Subtle differential attenuation in the inferior liver may be related to fatty deposition. Tiny calcified gallstone evident. No intrahepatic or extrahepatic biliary dilation. Pancreas: No focal mass lesion. No dilatation of the main duct. No intraparenchymal cyst. No peripancreatic edema. Spleen: No splenomegaly. No focal mass lesion. Adrenals/Urinary Tract: Similar appearance mild adrenal thickening. Right kidney unremarkable. Interpolar left renal cyst noted. No evidence for hydroureter. Subtle irregularity of the bladder wall is nonspecific but may be related to trabeculation. Stomach/Bowel: Tiny hiatal hernia. Stomach otherwise unremarkable. Duodenum is normally positioned as is the ligament of Treitz. No small bowel wall thickening. No small bowel dilatation. The terminal ileum is normal. The appendix is normal. No gross colonic mass. No colonic wall thickening. Diverticular changes are noted in the left colon without evidence of diverticulitis. Vascular/Lymphatic: There is abdominal aortic atherosclerosis without aneurysm. No hepatoduodenal ligament or gastrohepatic ligament lymphadenopathy. 2.9 x 1.9 cm ill-defined necrotic central mesenteric lymph node visible in the left paramidline abdomen (image 74/series 2). A second necrotic mesenteric mass is identified in the anterior left abdomen, measuring 5.9 x 4.4 x 5.9 cm on image 79/2. This particular mass is intimately associated with a small bowel loop that stretches around the periphery of the lesion. It is unclear whether this is mesenteric in origin or related to the small bowel. No pelvic sidewall lymphadenopathy. Reproductive: Prostate gland is enlarged with fiducial markers evident. Other: No intraperitoneal free fluid. Musculoskeletal: No worrisome lytic or sclerotic osseous abnormality. IMPRESSION: 1. Within the right suprahilar scarring, there has been interval development of a 2.9 x 2.5 cm central masslike component highly concerning for recurrent  disease. PET-CT recommended to further evaluate. 2. 5.9 x 4.4 x 5.9 cm necrotic mesenteric mass in the anterior left abdomen,  intimately associated with a small bowel loop that stretches around the periphery of the lesion. Lesion causes no obstruction at this time. Metastatic disease would be a consideration. Small bowel primary including lymphoma would also be a consideration although considered less likely. 3. 2.9 x 1.9 cm ill-defined necrotic central mesenteric lymph node, consistent with metastatic disease. 4. Cholelithiasis. 5. Left colonic diverticulosis without diverticulitis. 6. Prostatomegaly. 7. Aortic Atherosclerosis (ICD10-I70.0). Electronically Signed   By: Misty Stanley M.D.   On: 11/05/2019 08:24   CT Abdomen Pelvis W Contrast  Result Date: 11/05/2019 CLINICAL DATA:  Non-small-cell lung cancer.  Restaging. EXAM: CT CHEST, ABDOMEN, AND PELVIS WITH CONTRAST TECHNIQUE: Multidetector CT imaging of the chest, abdomen and pelvis was performed following the standard protocol during bolus administration of intravenous contrast. CONTRAST:  156mL OMNIPAQUE IOHEXOL 300 MG/ML  SOLN COMPARISON:  Chest CT 08/10/2019. FINDINGS: CT CHEST FINDINGS Cardiovascular: The heart size is normal. No substantial pericardial effusion. Coronary artery calcification is evident. Atherosclerotic calcification is noted in the wall of the thoracic aorta. Mediastinum/Nodes: No mediastinal lymphadenopathy. There is no hilar lymphadenopathy. There is no axillary lymphadenopathy. The esophagus has normal imaging features. Lungs/Pleura: Collapse/consolidative opacity in the suprahilar right upper lobe again noted, compatible with previous radiation therapy. Interval development of a central masslike component measuring 2.9 x 2.5 cm on image 47/6 displaces adjacent airways and is highly concerning for recurrent disease. No change in lingular scarring. The tiny left perifissural nodule seen on image 70/6 today is stable. No pleural  effusion. Musculoskeletal: No worrisome lytic or sclerotic osseous abnormality. CT ABDOMEN PELVIS FINDINGS Hepatobiliary: Peripheral low-density wedge-shaped lesion in the posterior right liver with associated dystrophic calcification is stable. Subtle differential attenuation in the inferior liver may be related to fatty deposition. Tiny calcified gallstone evident. No intrahepatic or extrahepatic biliary dilation. Pancreas: No focal mass lesion. No dilatation of the main duct. No intraparenchymal cyst. No peripancreatic edema. Spleen: No splenomegaly. No focal mass lesion. Adrenals/Urinary Tract: Similar appearance mild adrenal thickening. Right kidney unremarkable. Interpolar left renal cyst noted. No evidence for hydroureter. Subtle irregularity of the bladder wall is nonspecific but may be related to trabeculation. Stomach/Bowel: Tiny hiatal hernia. Stomach otherwise unremarkable. Duodenum is normally positioned as is the ligament of Treitz. No small bowel wall thickening. No small bowel dilatation. The terminal ileum is normal. The appendix is normal. No gross colonic mass. No colonic wall thickening. Diverticular changes are noted in the left colon without evidence of diverticulitis. Vascular/Lymphatic: There is abdominal aortic atherosclerosis without aneurysm. No hepatoduodenal ligament or gastrohepatic ligament lymphadenopathy. 2.9 x 1.9 cm ill-defined necrotic central mesenteric lymph node visible in the left paramidline abdomen (image 74/series 2). A second necrotic mesenteric mass is identified in the anterior left abdomen, measuring 5.9 x 4.4 x 5.9 cm on image 79/2. This particular mass is intimately associated with a small bowel loop that stretches around the periphery of the lesion. It is unclear whether this is mesenteric in origin or related to the small bowel. No pelvic sidewall lymphadenopathy. Reproductive: Prostate gland is enlarged with fiducial markers evident. Other: No intraperitoneal free  fluid. Musculoskeletal: No worrisome lytic or sclerotic osseous abnormality. IMPRESSION: 1. Within the right suprahilar scarring, there has been interval development of a 2.9 x 2.5 cm central masslike component highly concerning for recurrent disease. PET-CT recommended to further evaluate. 2. 5.9 x 4.4 x 5.9 cm necrotic mesenteric mass in the anterior left abdomen, intimately associated with a small bowel loop that stretches around the periphery of the  lesion. Lesion causes no obstruction at this time. Metastatic disease would be a consideration. Small bowel primary including lymphoma would also be a consideration although considered less likely. 3. 2.9 x 1.9 cm ill-defined necrotic central mesenteric lymph node, consistent with metastatic disease. 4. Cholelithiasis. 5. Left colonic diverticulosis without diverticulitis. 6. Prostatomegaly. 7. Aortic Atherosclerosis (ICD10-I70.0). Electronically Signed   By: Misty Stanley M.D.   On: 11/05/2019 08:24        This patient was seen with Dr. Julien Nordmann with my treatment plan reviewed with him. He expressed agreement with my medical management of this patient.  ADDENDUM: Hematology/Oncology Attending: I had a face-to-face encounter with the patient.  I recommended his care plan.  This is a very pleasant 74 years old white male with multiple malignancy including history of prostate cancer, follicular neoplasm of the left thyroid lobe status post resection in November 2020, stage IIIA non-small cell lung cancer, adenocarcinoma status post concurrent chemoradiation with weekly carboplatin and paclitaxel and he is currently undergoing consolidation treatment with immunotherapy with Imfinzi 1500 mg IV every 4 weeks status post 6 cycles.  The patient has been tolerating this treatment well with no concerning adverse effects. He presented to the symptom management clinic complaining of left upper quadrant abdominal pain as well as black stool. We will order CT scan of the  chest, abdomen pelvis recently and has a scan showed interval development of 2.9 x 2.5 cm central mass component highly concerning for recurrent disease within the right suprahilar area.  There was also 2.9 x 1.9 cm ill-defined necrotic central mesenteric lymph node as well as 5.9 x 4.4 x 5.9 cm necrotic mesenteric mass in the anterior left abdomen associated with the small bowel loop and metastatic disease could not be excluded at this point. I had a lengthy discussion with the patient today about his condition and further investigation to rule out metastatic disease.  I recommended for the patient to have a PET scan performed later this week.  We will check his stool for Hemoccult We will also refer the patient to gastroenterology for evaluation of the black tarry stool and the suspicious mass in the abdomen. The patient agreed to the current plan. He will come back for follow-up visit in 1 week for evaluation. He was advised to call immediately if he has any concerning symptoms in the interval.  Disclaimer: This note was dictated with voice recognition software. Similar sounding words can inadvertently be transcribed and may be missed upon review. Eilleen Kempf, MD 11/06/19

## 2019-11-07 ENCOUNTER — Inpatient Hospital Stay: Payer: Medicare Other | Admitting: Internal Medicine

## 2019-11-07 ENCOUNTER — Telehealth: Payer: Self-pay | Admitting: Internal Medicine

## 2019-11-07 ENCOUNTER — Inpatient Hospital Stay: Payer: Medicare Other

## 2019-11-07 ENCOUNTER — Telehealth: Payer: Self-pay | Admitting: Physician Assistant

## 2019-11-07 NOTE — Telephone Encounter (Signed)
Spoke to the patient that Dr. Julien Nordmann would like to cancel his appointments for today. The patient is scheduled for a PET scan tomorrow and Dr. Julien Nordmann is not planning on giving him treatment today until his condition can be further evaluated on his PET scan. The patient is aware of the plan change. I sent a scheduling message to reschedule his appointment for early next week

## 2019-11-07 NOTE — Progress Notes (Signed)
Pharmacist Chemotherapy Monitoring - Follow Up Assessment    I verify that I have reviewed each item in the below checklist:  . Regimen for the patient is scheduled for the appropriate day and plan matches scheduled date. Marland Kitchen Appropriate non-routine labs are ordered dependent on drug ordered. . If applicable, additional medications reviewed and ordered per protocol based on lifetime cumulative doses and/or treatment regimen.   Plan for follow-up and/or issues identified: No . I-vent associated with next due treatment: No . MD and/or nursing notified: No   Kennith Center, Pharm.D., CPP 11/07/2019@2 :33 PM

## 2019-11-07 NOTE — Telephone Encounter (Signed)
R/s appt per 6/16 sch message - unable to reach pt . Left message with appt date and time.

## 2019-11-08 ENCOUNTER — Encounter (HOSPITAL_COMMUNITY)
Admission: RE | Admit: 2019-11-08 | Discharge: 2019-11-08 | Disposition: A | Discharge status: Home / Self Care | Payer: Medicare Other | Source: Ambulatory Visit | Attending: Medical | Admitting: Medical

## 2019-11-08 ENCOUNTER — Other Ambulatory Visit: Payer: Self-pay

## 2019-11-08 DIAGNOSIS — K5730 Diverticulosis of large intestine without perforation or abscess without bleeding: Secondary | ICD-10-CM | POA: Diagnosis not present

## 2019-11-08 DIAGNOSIS — J322 Chronic ethmoidal sinusitis: Secondary | ICD-10-CM | POA: Diagnosis not present

## 2019-11-08 DIAGNOSIS — I700 Atherosclerosis of aorta: Secondary | ICD-10-CM | POA: Diagnosis not present

## 2019-11-08 DIAGNOSIS — K8020 Calculus of gallbladder without cholecystitis without obstruction: Secondary | ICD-10-CM | POA: Insufficient documentation

## 2019-11-08 DIAGNOSIS — N400 Enlarged prostate without lower urinary tract symptoms: Secondary | ICD-10-CM | POA: Insufficient documentation

## 2019-11-08 DIAGNOSIS — R1907 Generalized intra-abdominal and pelvic swelling, mass and lump: Secondary | ICD-10-CM | POA: Diagnosis not present

## 2019-11-08 DIAGNOSIS — I517 Cardiomegaly: Secondary | ICD-10-CM | POA: Diagnosis not present

## 2019-11-08 DIAGNOSIS — C3490 Malignant neoplasm of unspecified part of unspecified bronchus or lung: Secondary | ICD-10-CM | POA: Diagnosis not present

## 2019-11-08 DIAGNOSIS — I2510 Atherosclerotic heart disease of native coronary artery without angina pectoris: Secondary | ICD-10-CM | POA: Insufficient documentation

## 2019-11-08 LAB — GLUCOSE, CAPILLARY: Glucose-Capillary: 125 mg/dL — ABNORMAL HIGH (ref 70–99)

## 2019-11-08 MED ORDER — FLUDEOXYGLUCOSE F - 18 (FDG) INJECTION: 10.0000 | Freq: Once | INTRAVENOUS | Status: DC | PRN

## 2019-11-08 NOTE — Progress Notes (Signed)
These preliminary result these preliminary results were noted.  Awaiting final report.

## 2019-11-08 NOTE — Progress Notes (Signed)
These results were reviewed with Dr. Julien Nordmann.

## 2019-11-09 ENCOUNTER — Other Ambulatory Visit: Payer: Self-pay | Admitting: Internal Medicine

## 2019-11-09 ENCOUNTER — Telehealth: Payer: Self-pay | Admitting: Medical Oncology

## 2019-11-09 ENCOUNTER — Other Ambulatory Visit: Payer: Self-pay

## 2019-11-09 DIAGNOSIS — C3491 Malignant neoplasm of unspecified part of right bronchus or lung: Secondary | ICD-10-CM

## 2019-11-09 DIAGNOSIS — R1084 Generalized abdominal pain: Secondary | ICD-10-CM

## 2019-11-09 DIAGNOSIS — Z79899 Other long term (current) drug therapy: Secondary | ICD-10-CM | POA: Diagnosis not present

## 2019-11-09 DIAGNOSIS — C3411 Malignant neoplasm of upper lobe, right bronchus or lung: Secondary | ICD-10-CM | POA: Diagnosis not present

## 2019-11-09 DIAGNOSIS — Z8249 Family history of ischemic heart disease and other diseases of the circulatory system: Secondary | ICD-10-CM | POA: Diagnosis not present

## 2019-11-09 DIAGNOSIS — Z87891 Personal history of nicotine dependence: Secondary | ICD-10-CM | POA: Diagnosis not present

## 2019-11-09 DIAGNOSIS — Z923 Personal history of irradiation: Secondary | ICD-10-CM | POA: Diagnosis not present

## 2019-11-09 DIAGNOSIS — E785 Hyperlipidemia, unspecified: Secondary | ICD-10-CM | POA: Diagnosis not present

## 2019-11-09 DIAGNOSIS — I10 Essential (primary) hypertension: Secondary | ICD-10-CM | POA: Diagnosis not present

## 2019-11-09 DIAGNOSIS — I2510 Atherosclerotic heart disease of native coronary artery without angina pectoris: Secondary | ICD-10-CM | POA: Diagnosis not present

## 2019-11-09 DIAGNOSIS — Z85850 Personal history of malignant neoplasm of thyroid: Secondary | ICD-10-CM | POA: Diagnosis not present

## 2019-11-09 DIAGNOSIS — K219 Gastro-esophageal reflux disease without esophagitis: Secondary | ICD-10-CM | POA: Diagnosis not present

## 2019-11-09 LAB — OCCULT BLOOD X 1 CARD TO LAB, STOOL
Fecal Occult Bld: POSITIVE — AB
Fecal Occult Bld: POSITIVE — AB
Fecal Occult Bld: POSITIVE — AB

## 2019-11-09 NOTE — Telephone Encounter (Signed)
Today -Sm amt of pink tinged sputum  and BRB on toilet paper after having BM.  He endorses hemorrhoids.  He left a stool sample today in lab. Hemoccult order placed.   He feels fine and is walking his dog now at the dog park.

## 2019-11-13 ENCOUNTER — Encounter: Payer: Self-pay | Admitting: Internal Medicine

## 2019-11-13 ENCOUNTER — Inpatient Hospital Stay: Payer: Medicare Other

## 2019-11-13 ENCOUNTER — Other Ambulatory Visit: Payer: Self-pay

## 2019-11-13 ENCOUNTER — Encounter: Payer: Self-pay | Admitting: *Deleted

## 2019-11-13 ENCOUNTER — Telehealth: Payer: Self-pay | Admitting: *Deleted

## 2019-11-13 ENCOUNTER — Inpatient Hospital Stay: Payer: Medicare Other | Admitting: Internal Medicine

## 2019-11-13 VITALS — BP 117/84 | HR 85 | Temp 98.2°F | Resp 18 | Ht 67.0 in | Wt 199.4 lb

## 2019-11-13 DIAGNOSIS — Z87891 Personal history of nicotine dependence: Secondary | ICD-10-CM | POA: Diagnosis not present

## 2019-11-13 DIAGNOSIS — Z8249 Family history of ischemic heart disease and other diseases of the circulatory system: Secondary | ICD-10-CM | POA: Diagnosis not present

## 2019-11-13 DIAGNOSIS — K219 Gastro-esophageal reflux disease without esophagitis: Secondary | ICD-10-CM | POA: Diagnosis not present

## 2019-11-13 DIAGNOSIS — Z79899 Other long term (current) drug therapy: Secondary | ICD-10-CM | POA: Diagnosis not present

## 2019-11-13 DIAGNOSIS — C3491 Malignant neoplasm of unspecified part of right bronchus or lung: Secondary | ICD-10-CM

## 2019-11-13 DIAGNOSIS — Z85850 Personal history of malignant neoplasm of thyroid: Secondary | ICD-10-CM | POA: Diagnosis not present

## 2019-11-13 DIAGNOSIS — I10 Essential (primary) hypertension: Secondary | ICD-10-CM | POA: Diagnosis not present

## 2019-11-13 DIAGNOSIS — C3411 Malignant neoplasm of upper lobe, right bronchus or lung: Secondary | ICD-10-CM | POA: Diagnosis not present

## 2019-11-13 DIAGNOSIS — I2510 Atherosclerotic heart disease of native coronary artery without angina pectoris: Secondary | ICD-10-CM | POA: Diagnosis not present

## 2019-11-13 DIAGNOSIS — Z923 Personal history of irradiation: Secondary | ICD-10-CM | POA: Diagnosis not present

## 2019-11-13 DIAGNOSIS — E785 Hyperlipidemia, unspecified: Secondary | ICD-10-CM | POA: Diagnosis not present

## 2019-11-13 LAB — CBC WITH DIFFERENTIAL (CANCER CENTER ONLY)
Abs Immature Granulocytes: 0.04 10*3/uL (ref 0.00–0.07)
Basophils Absolute: 0 10*3/uL (ref 0.0–0.1)
Basophils Relative: 0 %
Eosinophils Absolute: 0.3 10*3/uL (ref 0.0–0.5)
Eosinophils Relative: 4 %
HCT: 37.9 % — ABNORMAL LOW (ref 39.0–52.0)
Hemoglobin: 12.1 g/dL — ABNORMAL LOW (ref 13.0–17.0)
Immature Granulocytes: 1 %
Lymphocytes Relative: 14 %
Lymphs Abs: 1.1 10*3/uL (ref 0.7–4.0)
MCH: 27.3 pg (ref 26.0–34.0)
MCHC: 31.9 g/dL (ref 30.0–36.0)
MCV: 85.4 fL (ref 80.0–100.0)
Monocytes Absolute: 0.7 10*3/uL (ref 0.1–1.0)
Monocytes Relative: 9 %
Neutro Abs: 5.9 10*3/uL (ref 1.7–7.7)
Neutrophils Relative %: 72 %
Platelet Count: 287 10*3/uL (ref 150–400)
RBC: 4.44 MIL/uL (ref 4.22–5.81)
RDW: 14.6 % (ref 11.5–15.5)
WBC Count: 8 10*3/uL (ref 4.0–10.5)
nRBC: 0 % (ref 0.0–0.2)

## 2019-11-13 LAB — CMP (CANCER CENTER ONLY)
ALT: 20 U/L (ref 0–44)
AST: 17 U/L (ref 15–41)
Albumin: 3.8 g/dL (ref 3.5–5.0)
Alkaline Phosphatase: 75 U/L (ref 38–126)
Anion gap: 10 (ref 5–15)
BUN: 10 mg/dL (ref 8–23)
CO2: 23 mmol/L (ref 22–32)
Calcium: 9.1 mg/dL (ref 8.9–10.3)
Chloride: 106 mmol/L (ref 98–111)
Creatinine: 1.26 mg/dL — ABNORMAL HIGH (ref 0.61–1.24)
GFR, Est AFR Am: >60 mL/min (ref >60)
GFR, Estimated: 55 mL/min — ABNORMAL LOW (ref >60)
Glucose, Bld: 141 mg/dL — ABNORMAL HIGH (ref 70–99)
Potassium: 4.6 mmol/L (ref 3.5–5.1)
Sodium: 139 mmol/L (ref 135–145)
Total Bilirubin: 0.6 mg/dL (ref 0.3–1.2)
Total Protein: 7.2 g/dL (ref 6.5–8.1)

## 2019-11-13 LAB — TSH: TSH: 5.895 u[IU]/mL — ABNORMAL HIGH (ref 0.320–4.118)

## 2019-11-13 NOTE — Progress Notes (Signed)
I received a call back from [facility]. They requested me to fax recent office note and other important information to them.  I faxed urgent referral to [facility] [place]. Dr. Julien Nordmann updated.

## 2019-11-13 NOTE — Telephone Encounter (Signed)
I received a message from [facility] that Carl Harrell has an appt on 11/20/19 at 3pm.  I gave him the address and phone number of his appt. I called [facility] and updated them that I called and spoke with the patient and he verbalized understanding of appt time and place.  They requested information on insurance and current phone number. I updated.

## 2019-11-13 NOTE — Progress Notes (Signed)
Per Dr. Julien Nordmann he requested me to call [facility] for a them to see patient.  I called but was unable to reach anyone at the office. I did leave a vm message with my name and phone number to call.

## 2019-11-13 NOTE — Progress Notes (Signed)
[facility] [phone]   [phone]  OFFICE PROGRESS NOTE  [name], MD [address] [place] [address]  DIAGNOSIS:  1) Stage IIIA (T2b, N2, M0) non-small cell lung cancer likely adenocarcinoma pending final tissue diagnosis presented with right upper lobe lung mass in addition to right hilar and mediastinal lymphadenopathy diagnosed in September 2020 2) history of prostate adenocarcinoma status post radiotherapy under the care of Dr. Tammi Klippel. 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  PRIOR THERAPY: Concurrent chemoradiation with weekly carboplatin for AUC of 2 and paclitaxel 45 mg/M2.  First dose March 05, 2019.  Status post 7 cycles.  Last dose was given April 16, 2019 with partial response.  CURRENT THERAPY: Consolidation treatment with immunotherapy with Imfinzi 1500 mg IV every 4 weeks.  First dose May 22, 2019.  Status post 6 cycles.  INTERVAL HISTORY: Carl Harrell 74 y.o. male returns to the clinic today for follow-up visit.  The patient continues to have intermittent abdominal pain.  He denied having any current chest pain, shortness of breath, cough or hemoptysis.  He denied having any fever or chills.  He has no nausea, vomiting, diarrhea or constipation.  He has no headache or visual changes.  He has no recent weight loss or night sweats.  The patient had a PET scan performed recently for evaluation of suspicious disease progression and he is here for discussion of the PET scan results and recommendation regarding his condition.  He also has a stool that was Hemoccult positive.  MEDICAL HISTORY: Past Medical History:  Diagnosis Date  . ALLERGIC RHINITIS 01/30/2007  . Arthritis    "hands" (05/29/2014)  . Cancer ([place])    lung, prostate  . Constipation   . Coronary artery disease    LHC (05/29/14): pLAD 50-70, pRCA 99 (L>R collats), EF 55% >> PCI: 3.5  x 28 mm Xience [employer] DES to [employer]  . Cough secondary to angiotensin converting enzyme inhibitor (ACE-I) 05/28/2013  . ED (erectile dysfunction)   . GERD 09/26/2007  . HYPERLIPIDEMIA 09/26/2007  . Hypertension   . Lung cancer ([place]) dx'd 03/2018  . PLANTAR FASCIITIS, BILATERAL 07/08/2009  . Pre-diabetes   . Prostate CA ([place]) dx'd 2017 or 2018   xrt  . Unspecified hearing loss 06/16/2009   wears aides both ears    ALLERGIES:  is allergic to lisinopril.  MEDICATIONS:  Current Outpatient Medications  Medication Sig Dispense Refill  . calcium carbonate (TUMS - DOSED IN MG ELEMENTAL CALCIUM) 500 MG chewable tablet Chew 1 tablet by mouth daily.    . cetirizine (ZYRTEC) 10 MG tablet Take 1 tablet (10 mg total) by mouth daily. (Patient not taking: Reported on 10/25/2019) 30 tablet 11  . ezetimibe (ZETIA) 10 MG tablet TAKE 1 TABLET BY MOUTH EVERY DAY (Patient taking differently: Take 10 mg by mouth at bedtime. ) 90 tablet 3  . fluticasone (FLONASE) 50 MCG/ACT nasal spray SHAKE LIQUID AND USE 1 SPRAY IN EACH NOSTRIL DAILY (Patient taking differently: Place 1 spray into both nostrils daily. ) 48 g 0  . levothyroxine (SYNTHROID) 100 MCG tablet Take 1 tablet (100 mcg total) by mouth daily. 90 tablet 3  . losartan (COZAAR) 50 MG tablet Take 1 tablet (50 mg total) by mouth daily. Please keep upcoming appt in April with Dr. Radford Pax before anymore refills. Thank you 90 tablet 0  . metoprolol tartrate (LOPRESSOR) 25 MG tablet  TAKE 1 TABLET BY MOUTH TWICE DAILY 180 tablet 0  . Multiple Vitamin (MULTIVITAMIN WITH MINERALS) TABS tablet Take 1 tablet by mouth daily.    Marland Kitchen omeprazole (PRILOSEC) 40 MG capsule Take 1 capsule (40 mg total) by mouth 2 (two) times daily. 60 capsule 3  . simvastatin (ZOCOR) 40 MG tablet TAKE 1 TABLET(40 MG) BY MOUTH DAILY AT 6 PM (Patient taking differently: Take 40 mg by mouth daily at 6 PM. ) 90 tablet 3  . vitamin B-12 (CYANOCOBALAMIN) 50 MCG tablet Take 50 mcg by mouth daily.     No  current facility-administered medications for this visit.   Facility-Administered Medications Ordered in Other Visits  Medication Dose Route Frequency Provider Last Rate Last Admin  . fludeoxyglucose F - 18 (FDG) injection 10 millicurie  10 millicurie Intravenous Once PRN Barnet Glasgow, MD        SURGICAL HISTORY:  Past Surgical History:  Procedure Laterality Date  . CARDIAC CATHETERIZATION  05/29/2014   Procedure: CORONARY STENT INTERVENTION;  Surgeon: Sinclair Grooms, MD;  [facility];  Service: Cardiovascular;;  Prox RCA  . COLONOSCOPY    . CORONARY ANGIOPLASTY WITH STENT PLACEMENT  05/29/2014   "1"  . KNEE ARTHROSCOPY Right ~ 2005  . LEFT HEART CATHETERIZATION WITH CORONARY ANGIOGRAM N/A 05/29/2014   Procedure: LEFT HEART CATHETERIZATION WITH CORONARY ANGIOGRAM;  Surgeon: Sinclair Grooms, MD;  Location: [facility] CATH LAB;  Service: Cardiovascular;  Laterality: N/A;  . THYROIDECTOMY N/A 06/25/2019   Procedure: TOTAL THYROIDECTOMY;  Surgeon: Armandina Gemma, MD;  Location: [facility] ORS;  Service: General;  Laterality: N/A;  . VIDEO BRONCHOSCOPY WITH ENDOBRONCHIAL ULTRASOUND N/A 02/20/2019   Procedure: VIDEO BRONCHOSCOPY WITH ENDOBRONCHIAL ULTRASOUND;  Surgeon: Garner Nash, DO;  Location: [facility];  Service: Thoracic;  Laterality: N/A;    REVIEW OF SYSTEMS:  Constitutional: positive for fatigue Eyes: negative Ears, nose, mouth, throat, and face: negative Respiratory: negative Cardiovascular: negative Gastrointestinal: positive for abdominal pain Genitourinary:negative Integument/breast: negative Hematologic/lymphatic: negative Musculoskeletal:negative Neurological: negative Behavioral/Psych: negative Endocrine: negative Allergic/Immunologic: negative   PHYSICAL EXAMINATION: General appearance: alert, cooperative, fatigued and no distress Head: Normocephalic, without obvious abnormality, atraumatic Neck: no adenopathy, no JVD, supple, symmetrical, trachea midline and thyroid not  enlarged, symmetric, no tenderness/mass/nodules Lymph nodes: Cervical, supraclavicular, and axillary nodes normal. Resp: clear to auscultation bilaterally Back: symmetric, no curvature. ROM normal. No CVA tenderness. Cardio: regular rate and rhythm, S1, S2 normal, no murmur, click, rub or gallop GI: soft, non-tender; bowel sounds normal; no masses,  no organomegaly Extremities: extremities normal, atraumatic, no cyanosis or edema Neurologic: Alert and oriented X 3, normal strength and tone. Normal symmetric reflexes. Normal coordination and gait  ECOG PERFORMANCE STATUS: 1 - Symptomatic but completely ambulatory  Blood pressure 117/84, pulse 85, temperature 98.2 F (36.8 C), temperature source Temporal, resp. rate 18, height 5\' 7"  (1.702 m), weight 199 lb 6.4 oz (90.4 kg), SpO2 98 %.  LABORATORY DATA: Lab Results  Component Value Date   WBC 8.0 11/13/2019   HGB 12.1 (L) 11/13/2019   HCT 37.9 (L) 11/13/2019   MCV 85.4 11/13/2019   PLT 287 11/13/2019      Chemistry      Component Value Date/Time   NA 139 11/13/2019 0843   NA 141 08/05/2016 0826   K 4.6 11/13/2019 0843   CL 106 11/13/2019 0843   CO2 23 11/13/2019 0843   BUN 10 11/13/2019 0843   BUN 14 08/05/2016 0826   CREATININE 1.26 (H)  11/13/2019 0843      Component Value Date/Time   CALCIUM 9.1 11/13/2019 0843   ALKPHOS 75 11/13/2019 0843   AST 17 11/13/2019 0843   ALT 20 11/13/2019 0843   BILITOT 0.6 11/13/2019 0843       RADIOGRAPHIC STUDIES: CT Chest W Contrast  Result Date: 11/05/2019 CLINICAL DATA:  Non-small-cell lung cancer.  Restaging. EXAM: CT CHEST, ABDOMEN, AND PELVIS WITH CONTRAST TECHNIQUE: Multidetector CT imaging of the chest, abdomen and pelvis was performed following the standard protocol during bolus administration of intravenous contrast. CONTRAST:  162mL OMNIPAQUE IOHEXOL 300 MG/ML  SOLN COMPARISON:  Chest CT 08/10/2019. FINDINGS: CT CHEST FINDINGS Cardiovascular: The heart size is normal. No  substantial pericardial effusion. Coronary artery calcification is evident. Atherosclerotic calcification is noted in the wall of the thoracic aorta. Mediastinum/Nodes: No mediastinal lymphadenopathy. There is no hilar lymphadenopathy. There is no axillary lymphadenopathy. The esophagus has normal imaging features. Lungs/Pleura: Collapse/consolidative opacity in the suprahilar right upper lobe again noted, compatible with previous radiation therapy. Interval development of a central masslike component measuring 2.9 x 2.5 cm on image 47/6 displaces adjacent airways and is highly concerning for recurrent disease. No change in lingular scarring. The tiny left perifissural nodule seen on image 70/6 today is stable. No pleural effusion. Musculoskeletal: No worrisome lytic or sclerotic osseous abnormality. CT ABDOMEN PELVIS FINDINGS Hepatobiliary: Peripheral low-density wedge-shaped lesion in the posterior right liver with associated dystrophic calcification is stable. Subtle differential attenuation in the inferior liver may be related to fatty deposition. Tiny calcified gallstone evident. No intrahepatic or extrahepatic biliary dilation. Pancreas: No focal mass lesion. No dilatation of the main duct. No intraparenchymal cyst. No peripancreatic edema. Spleen: No splenomegaly. No focal mass lesion. Adrenals/Urinary Tract: Similar appearance mild adrenal thickening. Right kidney unremarkable. Interpolar left renal cyst noted. No evidence for hydroureter. Subtle irregularity of the bladder wall is nonspecific but may be related to trabeculation. Stomach/Bowel: Tiny hiatal hernia. Stomach otherwise unremarkable. Duodenum is normally positioned as is the ligament of Treitz. No small bowel wall thickening. No small bowel dilatation. The terminal ileum is normal. The appendix is normal. No gross colonic mass. No colonic wall thickening. Diverticular changes are noted in the left colon without evidence of diverticulitis.  Vascular/Lymphatic: There is abdominal aortic atherosclerosis without aneurysm. No hepatoduodenal ligament or gastrohepatic ligament lymphadenopathy. 2.9 x 1.9 cm ill-defined necrotic central mesenteric lymph node visible in the left paramidline abdomen (image 74/series 2). A second necrotic mesenteric mass is identified in the anterior left abdomen, measuring 5.9 x 4.4 x 5.9 cm on image 79/2. This particular mass is intimately associated with a small bowel loop that stretches around the periphery of the lesion. It is unclear whether this is mesenteric in origin or related to the small bowel. No pelvic sidewall lymphadenopathy. Reproductive: Prostate gland is enlarged with fiducial markers evident. Other: No intraperitoneal free fluid. Musculoskeletal: No worrisome lytic or sclerotic osseous abnormality. IMPRESSION: 1. Within the right suprahilar scarring, there has been interval development of a 2.9 x 2.5 cm central masslike component highly concerning for recurrent disease. PET-CT recommended to further evaluate. 2. 5.9 x 4.4 x 5.9 cm necrotic mesenteric mass in the anterior left abdomen, intimately associated with a small bowel loop that stretches around the periphery of the lesion. Lesion causes no obstruction at this time. Metastatic disease would be a consideration. Small bowel primary including lymphoma would also be a consideration although considered less likely. 3. 2.9 x 1.9 cm ill-defined necrotic central mesenteric lymph node, consistent  with metastatic disease. 4. Cholelithiasis. 5. Left colonic diverticulosis without diverticulitis. 6. Prostatomegaly. 7. Aortic Atherosclerosis (ICD10-I70.0). Electronically Signed   By: Misty Stanley M.D.   On: 11/05/2019 08:24   CT Abdomen Pelvis W Contrast  Result Date: 11/05/2019 CLINICAL DATA:  Non-small-cell lung cancer.  Restaging. EXAM: CT CHEST, ABDOMEN, AND PELVIS WITH CONTRAST TECHNIQUE: Multidetector CT imaging of the chest, abdomen and pelvis was  performed following the standard protocol during bolus administration of intravenous contrast. CONTRAST:  141mL OMNIPAQUE IOHEXOL 300 MG/ML  SOLN COMPARISON:  Chest CT 08/10/2019. FINDINGS: CT CHEST FINDINGS Cardiovascular: The heart size is normal. No substantial pericardial effusion. Coronary artery calcification is evident. Atherosclerotic calcification is noted in the wall of the thoracic aorta. Mediastinum/Nodes: No mediastinal lymphadenopathy. There is no hilar lymphadenopathy. There is no axillary lymphadenopathy. The esophagus has normal imaging features. Lungs/Pleura: Collapse/consolidative opacity in the suprahilar right upper lobe again noted, compatible with previous radiation therapy. Interval development of a central masslike component measuring 2.9 x 2.5 cm on image 47/6 displaces adjacent airways and is highly concerning for recurrent disease. No change in lingular scarring. The tiny left perifissural nodule seen on image 70/6 today is stable. No pleural effusion. Musculoskeletal: No worrisome lytic or sclerotic osseous abnormality. CT ABDOMEN PELVIS FINDINGS Hepatobiliary: Peripheral low-density wedge-shaped lesion in the posterior right liver with associated dystrophic calcification is stable. Subtle differential attenuation in the inferior liver may be related to fatty deposition. Tiny calcified gallstone evident. No intrahepatic or extrahepatic biliary dilation. Pancreas: No focal mass lesion. No dilatation of the main duct. No intraparenchymal cyst. No peripancreatic edema. Spleen: No splenomegaly. No focal mass lesion. Adrenals/Urinary Tract: Similar appearance mild adrenal thickening. Right kidney unremarkable. Interpolar left renal cyst noted. No evidence for hydroureter. Subtle irregularity of the bladder wall is nonspecific but may be related to trabeculation. Stomach/Bowel: Tiny hiatal hernia. Stomach otherwise unremarkable. Duodenum is normally positioned as is the ligament of Treitz. No  small bowel wall thickening. No small bowel dilatation. The terminal ileum is normal. The appendix is normal. No gross colonic mass. No colonic wall thickening. Diverticular changes are noted in the left colon without evidence of diverticulitis. Vascular/Lymphatic: There is abdominal aortic atherosclerosis without aneurysm. No hepatoduodenal ligament or gastrohepatic ligament lymphadenopathy. 2.9 x 1.9 cm ill-defined necrotic central mesenteric lymph node visible in the left paramidline abdomen (image 74/series 2). A second necrotic mesenteric mass is identified in the anterior left abdomen, measuring 5.9 x 4.4 x 5.9 cm on image 79/2. This particular mass is intimately associated with a small bowel loop that stretches around the periphery of the lesion. It is unclear whether this is mesenteric in origin or related to the small bowel. No pelvic sidewall lymphadenopathy. Reproductive: Prostate gland is enlarged with fiducial markers evident. Other: No intraperitoneal free fluid. Musculoskeletal: No worrisome lytic or sclerotic osseous abnormality. IMPRESSION: 1. Within the right suprahilar scarring, there has been interval development of a 2.9 x 2.5 cm central masslike component highly concerning for recurrent disease. PET-CT recommended to further evaluate. 2. 5.9 x 4.4 x 5.9 cm necrotic mesenteric mass in the anterior left abdomen, intimately associated with a small bowel loop that stretches around the periphery of the lesion. Lesion causes no obstruction at this time. Metastatic disease would be a consideration. Small bowel primary including lymphoma would also be a consideration although considered less likely. 3. 2.9 x 1.9 cm ill-defined necrotic central mesenteric lymph node, consistent with metastatic disease. 4. Cholelithiasis. 5. Left colonic diverticulosis without diverticulitis. 6. Prostatomegaly. 7.  Aortic Atherosclerosis (ICD10-I70.0). Electronically Signed   By: Misty Stanley M.D.   On: 11/05/2019 08:24     NM PET Image Restag (PS) Skull Base To Thigh  Result Date: 11/08/2019 CLINICAL DATA:  Subsequent treatment strategy for lung cancer, thyroid cancer, prostate cancer with new right lung nodule, new large mesenteric mass, and new mesenteric adenopathy. EXAM: NUCLEAR MEDICINE PET SKULL BASE TO THIGH TECHNIQUE: 10.0 mCi F-18 FDG was injected intravenously. Full-ring PET imaging was performed from the skull base to thigh after the radiotracer. CT data was obtained and used for attenuation correction and anatomic localization. Fasting blood glucose: 125 mg/dl COMPARISON:  Multiple exams, including 02/23/2019 and CT scan from 11/05/2018 FINDINGS: Mediastinal blood pool activity: SUV max 2.8 Liver activity: SUV max 4.2 NECK: No significant abnormal hypermetabolic activity in this region. Incidental CT findings: Chronic right ethmoid sinusitis. Bilateral common carotid atherosclerotic calcification. Thyroidectomy. CHEST: The 3.1 cm right upper lobe suprahilar mass has surrounding atelectasis and a maximum SUV of 10.1 (previously 14.8 in this region on prior PET-CT of 02/23/2019). Indistinct asleep bandlike lingular density observed, maximum SUV 1.2. Incidental CT findings: Coronary, aortic arch, and branch vessel atherosclerotic vascular disease. Mild cardiomegaly. ABDOMEN/PELVIS: Three hypermetabolic lesions in the liver likely reflect metastatic disease. Hypermetabolic activity of the lesion inferiorly in the right hepatic lobe measures about 2.1 cm in diameter, with maximum SUV of 7.2 2 other lesions are just above the gallbladder fossa and smaller. These were not present on prior PET-CT. The dominant and centrally necrotic hypermetabolic mass in the left small bowel mesentery measuring approximately 6.2 cm in long axis has maximum SUV of 8.6. Cephalad to this level, a separate mesenteric mass measuring 2.5 by 1.8 cm on image 131/4 has maximum SUV 8.9. These were not present on prior PET-CT. Incidental CT findings:  Cholelithiasis. Aortoiliac atherosclerotic vascular disease. Left mid kidney cyst posteriorly. Sigmoid colon diverticulosis. Considerable prostatomegaly with three small metal markers along the prostate gland. SKELETON: No significant abnormal hypermetabolic activity in this region. Incidental CT findings: none IMPRESSION: 1. Malignancy recurrence in the right suprahilar region, or a 3.1 cm mass has maximum SUV of 10.1. 2. Two hypermetabolic left eccentric mesenteric masses along the small bowel mesentery, along with 3 new hypermetabolic lesions in the liver, compatible with malignancy. 3. Other imaging findings of potential clinical significance: Chronic right ethmoid sinusitis. Aortic Atherosclerosis (ICD10-I70.0). Coronary atherosclerosis with mild cardiomegaly. Cholelithiasis. Sigmoid colon diverticulosis. Prostatomegaly. Electronically Signed   By: Van Clines M.D.   On: 11/08/2019 09:11    ASSESSMENT AND PLAN: This is a very pleasant 74 years old white male with recently diagnosed a stage IIIa (T2b, N2, M0) non-small cell lung cancer favoring adenocarcinoma presented with right upper lobe lung mass in addition to right hilar and mediastinal lymphadenopathy diagnosed in September 2020. The patient underwent a course of concurrent chemoradiation with weekly carboplatin and paclitaxel status post 7 cycles.  He tolerated the treatment well except for fatigue and dry cough. He has partial response to this treatment.   The patient is currently undergoing consolidation treatment with immunotherapy with Imfinzi 1500 mg IV every 4 weeks status post 6 cycles.  The patient has been tolerating this treatment well but unfortunately he continues to complain of abdominal pain and recent imaging studies including a PET scan showed malignancy recurrence in the right suprahilar region with 2 hypermetabolic left eccentric mesenteric masses along the small bowel mesentery and 3 new hypermetabolic lesions in the  liver compatible with malignancy.  The patient  continues to have intermittent abdominal pain. His stool was positive for Hemoccult. I recommended for the patient to see his gastroenterologist for evaluation and repeat colonoscopy to rule out development of colon adenocarcinoma.  If the colon is negative for malignancy, will consider treating the patient for metastatic non-small cell lung cancer. I will see him back for follow-up visit in 2 weeks for evaluation and detailed discussion of his treatment options hopefully after the colonoscopy. The patient was advised to call immediately if he has any concerning symptoms in the interval. For the thyroid cancer, he underwent surgical resection and he is followed by endocrinology.  All questions were answered. The patient knows to call the clinic with any problems, questions or concerns. We can certainly see the patient much sooner if necessary.  Disclaimer: This note was dictated with voice recognition software. Similar sounding words can inadvertently be transcribed and may not be corrected upon review.

## 2019-11-16 ENCOUNTER — Telehealth: Payer: Self-pay | Admitting: Internal Medicine

## 2019-11-16 NOTE — Telephone Encounter (Signed)
Scheduled per los. Called and spoke with patient. Confirmed appt 

## 2019-11-20 DIAGNOSIS — R195 Other fecal abnormalities: Secondary | ICD-10-CM | POA: Diagnosis not present

## 2019-11-20 DIAGNOSIS — K5900 Constipation, unspecified: Secondary | ICD-10-CM | POA: Diagnosis not present

## 2019-11-20 DIAGNOSIS — R1084 Generalized abdominal pain: Secondary | ICD-10-CM | POA: Diagnosis not present

## 2019-11-21 DIAGNOSIS — Z1159 Encounter for screening for other viral diseases: Secondary | ICD-10-CM | POA: Diagnosis not present

## 2019-11-23 DIAGNOSIS — Z86010 Personal history of colonic polyps: Secondary | ICD-10-CM | POA: Diagnosis not present

## 2019-11-23 DIAGNOSIS — D123 Benign neoplasm of transverse colon: Secondary | ICD-10-CM | POA: Diagnosis not present

## 2019-11-23 DIAGNOSIS — K3189 Other diseases of stomach and duodenum: Secondary | ICD-10-CM | POA: Diagnosis not present

## 2019-11-23 DIAGNOSIS — D124 Benign neoplasm of descending colon: Secondary | ICD-10-CM | POA: Diagnosis not present

## 2019-11-23 DIAGNOSIS — R1084 Generalized abdominal pain: Secondary | ICD-10-CM | POA: Diagnosis not present

## 2019-11-23 DIAGNOSIS — K2900 Acute gastritis without bleeding: Secondary | ICD-10-CM | POA: Diagnosis not present

## 2019-11-23 DIAGNOSIS — R195 Other fecal abnormalities: Secondary | ICD-10-CM | POA: Diagnosis not present

## 2019-11-23 DIAGNOSIS — K449 Diaphragmatic hernia without obstruction or gangrene: Secondary | ICD-10-CM | POA: Diagnosis not present

## 2019-11-23 LAB — HM COLONOSCOPY

## 2019-11-25 NOTE — Progress Notes (Deleted)
[facility] OFFICE PROGRESS NOTE  [name], MD [place] [address]  DIAGNOSIS:  1) Metastatic non-small cell lung cancer, likely adenocarcinoma initially diagnosed as stage IIIA (T2b, N2, M0). Presented with right upper lobe lung mass in addition to right hilar and mediastinal lymphadenopathy diagnosed in September 2020. Showed disease progression with metastatic disease with Malignancy recurrence in the right suprahilar region and Two hypermetabolic left eccentric mesenteric masses along the small bowel mesentery, along with 3 new hypermetabolic lesions in the liver, compatible with malignancy in June 2021.  2) history of prostate adenocarcinoma status post radiotherapy under the care of Dr. Tammi Klippel. 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   Molecular Biomarkers: [id number] Everolimus, Temsirolimus Yes 4.9% [id number] None Yes 4.7% [id number] Yes 0.2% [id number] None No 5.2% [id number] None No 5.0% [id number] None (VUS) No (VUS) 4.4% [id number] (VUS) No (VUS) 3.5% MSI-HighNOT DETECTED  PRIOR THERAPY: 1) Concurrent chemoradiation with weekly carboplatin for AUC of 2 and paclitaxel 45 mg/M2.  First dose March 05, 2019.  Status post 7 cycles.  Last dose was given April 16, 2019 with partial response. 2)Total thyroidectomy on February 1, 21696for follicular neoplasm of the thyroid under the care of Dr. GHarlow Asa3) Consolidation immunotherapy with immunotherapy with Imfinzi 1500 mg IV every 4 weeks. Last dose 10/11/19.   CURRENT THERAPY: Systemic chemotherapy with Carboplatin for an AUC of 5, Alimta 500 mg/m2, and Keytruda 200 mg IV every 3 weeks. First dose expected on __.   INTERVAL HISTORY: Carl COMMONS89y.[id number]. male returns to the clinic today for follow-up visit.  The patient is Being_today without any concerning  complaints except for __.  The patient's last cycle of consolidation immunotherapy with Imfinzi was on 10/11/2019.  In the interval since his last treatment, the patient has been endorsing increased abdominal pain, gas, nausea, and vomiting.  A CT scan of the abdomen and pelvis was added onto his restaging CT scan which noted evidence of disease progression with 3 hypermetabolic lesions in the liver.  The scan also noted disease recurrence in the suprahilar region.  Additionally the scan noted 2 hypermetabolic left eccentric mesenteric masses along the small bowel mesentery.  The patient had fecal occult stool cards which were positive for blood.  The patient had a referral placed to gastroenterology.  The patient had an appointment with them on 11/20/2019 with Dr. [facility].  At this appointment it was determined that the patient would__.  He is scheduled for Or_on__.   Otherwise today, the patient denies any recent fever, chills, or night sweats.  Weight loss?.  He reports his baseline dyspnea on exertion and productive cough.  He reports intermittent nausea, vomiting, and diarrhea.  He denies any constipation.  Abdominal pain?  He denies any headache or visual changes.  The patient is here today for evaluation and a more detailed discussion about his current condition and recommendations.  MEDICAL HISTORY: Past Medical History:  Diagnosis Date  . ALLERGIC RHINITIS 01/30/2007  . Arthritis    "hands" (05/29/2014)  . Cancer [place]    lung, prostate  . Constipation   . Coronary artery disease    LHC (05/29/14): pLAD 50-70, pRCA 99 (L>R collats), EF 55% >> PCI: 3.5 x 28 mm Xience Alpine DES to [employer] . Cough secondary to angiotensin converting enzyme inhibitor (ACE-I) 05/28/2013  . ED (erectile dysfunction)   . GERD 09/26/2007  .  HYPERLIPIDEMIA 09/26/2007  . Hypertension   . Lung cancer (HCC) dx'd 03/2018  . PLANTAR FASCIITIS, BILATERAL 07/08/2009  . Pre-diabetes   . Prostate CA (HCC) dx'd  2017 or 2018   xrt  . Unspecified hearing loss 06/16/2009   wears aides both ears    ALLERGIES:  is allergic to lisinopril.  MEDICATIONS:  Current Outpatient Medications  Medication Sig Dispense Refill  . calcium carbonate (TUMS - DOSED IN MG ELEMENTAL CALCIUM) 500 MG chewable tablet Chew 1 tablet by mouth daily.    . cetirizine (ZYRTEC) 10 MG tablet Take 1 tablet (10 mg total) by mouth daily. (Patient not taking: Reported on 10/25/2019) 30 tablet 11  . ezetimibe (ZETIA) 10 MG tablet TAKE 1 TABLET BY MOUTH EVERY DAY (Patient taking differently: Take 10 mg by mouth at bedtime. ) 90 tablet 3  . fluticasone (FLONASE) 50 MCG/ACT nasal spray SHAKE LIQUID AND USE 1 SPRAY IN EACH NOSTRIL DAILY (Patient taking differently: Place 1 spray into both nostrils daily. ) 48 g 0  . levothyroxine (SYNTHROID) 100 MCG tablet Take 1 tablet (100 mcg total) by mouth daily. 90 tablet 3  . losartan (COZAAR) 50 MG tablet Take 1 tablet (50 mg total) by mouth daily. Please keep upcoming appt in April with Dr. Mayford Knife before anymore refills. Thank you 90 tablet 0  . metoprolol tartrate (LOPRESSOR) 25 MG tablet TAKE 1 TABLET BY MOUTH TWICE DAILY 180 tablet 0  . Multiple Vitamin (MULTIVITAMIN WITH MINERALS) TABS tablet Take 1 tablet by mouth daily.    Marland Kitchen omeprazole (PRILOSEC) 40 MG capsule Take 1 capsule (40 mg total) by mouth 2 (two) times daily. 60 capsule 3  . simvastatin (ZOCOR) 40 MG tablet TAKE 1 TABLET(40 MG) BY MOUTH DAILY AT 6 PM (Patient taking differently: Take 40 mg by mouth daily at 6 PM. ) 90 tablet 3  . vitamin B-12 (CYANOCOBALAMIN) 50 MCG tablet Take 50 mcg by mouth daily.     No current facility-administered medications for this visit.    SURGICAL HISTORY:  Past Surgical History:  Procedure Laterality Date  . CARDIAC CATHETERIZATION  05/29/2014   Procedure: CORONARY STENT INTERVENTION;  Surgeon: Lesleigh Noe, MD;  Location: [facility] CATH LAB;  Service: Cardiovascular;;  Prox RCA  . COLONOSCOPY    .  CORONARY ANGIOPLASTY WITH STENT PLACEMENT  05/29/2014   "1"  . KNEE ARTHROSCOPY Right ~ 2005  . LEFT HEART CATHETERIZATION WITH CORONARY ANGIOGRAM N/A 05/29/2014   Procedure: LEFT HEART CATHETERIZATION WITH CORONARY ANGIOGRAM;  Surgeon: Lesleigh Noe, MD;  Location: [facility] CATH LAB;  Service: Cardiovascular;  Laterality: N/A;  . THYROIDECTOMY N/A 06/25/2019   Procedure: TOTAL THYROIDECTOMY;  Surgeon: Darnell Level, MD;  Location: [facility] ORS;  Service: General;  Laterality: N/A;  . VIDEO BRONCHOSCOPY WITH ENDOBRONCHIAL ULTRASOUND N/A 02/20/2019   Procedure: VIDEO BRONCHOSCOPY WITH ENDOBRONCHIAL ULTRASOUND;  Surgeon: Josephine Igo, DO;  Location: [facility] OR;  Service: Thoracic;  Laterality: N/A;    REVIEW OF SYSTEMS:   Review of Systems  Constitutional: Negative for appetite change, chills, fatigue, fever and unexpected weight change.  HENT:   Negative for mouth sores, nosebleeds, sore throat and trouble swallowing.   Eyes: Negative for eye problems and icterus.  Respiratory: Negative for cough, hemoptysis, shortness of breath and wheezing.   Cardiovascular: Negative for chest pain and leg swelling.  Gastrointestinal: Negative for abdominal pain, constipation, diarrhea, nausea and vomiting.  Genitourinary: Negative for bladder incontinence, difficulty urinating, dysuria, frequency and hematuria.   Musculoskeletal:  Negative for back pain, gait problem, neck pain and neck stiffness.  Skin: Negative for itching and rash.  Neurological: Negative for dizziness, extremity weakness, gait problem, headaches, light-headedness and seizures.  Hematological: Negative for adenopathy. Does not bruise/bleed easily.  Psychiatric/Behavioral: Negative for confusion, depression and sleep disturbance. The patient is not nervous/anxious.     PHYSICAL EXAMINATION:  There were no vitals taken for this visit.  ECOG PERFORMANCE STATUS: {CHL ONC ECOG [id number]  Physical Exam  Constitutional: Oriented to person, place,  and time and well-developed, well-nourished, and in no distress. No distress.  HENT:  Head: Normocephalic and atraumatic.  Mouth/Throat: Oropharynx is clear and moist. No oropharyngeal exudate.  Eyes: Conjunctivae are normal. Right eye exhibits no discharge. Left eye exhibits no discharge. No scleral icterus.  Neck: Normal range of motion. Neck supple.  Cardiovascular: Normal rate, regular rhythm, normal heart sounds and intact distal pulses.   Pulmonary/Chest: Effort normal and breath sounds normal. No respiratory distress. No wheezes. No rales.  Abdominal: Soft. Bowel sounds are normal. Exhibits no distension and no mass. There is no tenderness.  Musculoskeletal: Normal range of motion. Exhibits no edema.  Lymphadenopathy:    No cervical adenopathy.  Neurological: Alert and oriented to person, place, and time. Exhibits normal muscle tone. Gait normal. Coordination normal.  Skin: Skin is warm and dry. No rash noted. Not diaphoretic. No erythema. No pallor.  Psychiatric: Mood, memory and judgment normal.  Vitals reviewed.  LABORATORY DATA: Lab Results  Component Value Date   WBC 8.0 11/13/2019   HGB 12.1 (L) 11/13/2019   HCT 37.9 (L) 11/13/2019   MCV 85.4 11/13/2019   PLT 287 11/13/2019      Chemistry      Component Value Date/Time   NA 139 11/13/2019 0843   NA 141 08/05/2016 0826   K 4.6 11/13/2019 0843   CL 106 11/13/2019 0843   CO2 23 11/13/2019 0843   BUN 10 11/13/2019 0843   BUN 14 08/05/2016 0826   CREATININE 1.26 (H) 11/13/2019 0843      Component Value Date/Time   CALCIUM 9.1 11/13/2019 0843   ALKPHOS 75 11/13/2019 0843   AST 17 11/13/2019 0843   ALT 20 11/13/2019 0843   BILITOT 0.6 11/13/2019 0843       RADIOGRAPHIC STUDIES:  CT Chest W Contrast  Result Date: 11/05/2019 CLINICAL DATA:  Non-small-cell lung cancer.  Restaging. EXAM: CT CHEST, ABDOMEN, AND PELVIS WITH CONTRAST TECHNIQUE: Multidetector CT imaging of the chest, abdomen and pelvis was performed  following the standard protocol during bolus administration of intravenous contrast. CONTRAST:  OMNIPAQUE IOHEXOL 300 MG/ML  SOLN COMPARISON:  Chest CT 08/10/2019. FINDINGS: CT CHEST FINDINGS Cardiovascular: The heart size is normal. No substantial pericardial effusion. Coronary artery calcification is evident. Atherosclerotic calcification is noted in the wall of the thoracic aorta. Mediastinum/Nodes: No mediastinal lymphadenopathy. There is no hilar lymphadenopathy. There is no axillary lymphadenopathy. The esophagus has normal imaging features. Lungs/Pleura: Collapse/consolidative opacity in the suprahilar right upper lobe again noted, compatible with previous radiation therapy. Interval development of a central masslike component measuring 2.9 x 2.5 cm on image 47/6 displaces adjacent airways and is highly concerning for recurrent disease. No change in lingular scarring. The tiny left perifissural nodule seen on image 70/6 today is stable. No pleural effusion. Musculoskeletal: No worrisome lytic or sclerotic osseous abnormality. CT ABDOMEN PELVIS FINDINGS Hepatobiliary: Peripheral low-density wedge-shaped lesion in the posterior right liver with associated dystrophic calcification is stable. Subtle differential attenuation in the inferior  liver may be related to fatty deposition. Tiny calcified gallstone evident. No intrahepatic or extrahepatic biliary dilation. Pancreas: No focal mass lesion. No dilatation of the main duct. No intraparenchymal cyst. No peripancreatic edema. Spleen: No splenomegaly. No focal mass lesion. Adrenals/Urinary Tract: Similar appearance mild adrenal thickening. Right kidney unremarkable. Interpolar left renal cyst noted. No evidence for hydroureter. Subtle irregularity of the bladder wall is nonspecific but may be related to trabeculation. Stomach/Bowel: Tiny hiatal hernia. Stomach otherwise unremarkable. Duodenum is normally positioned as is the ligament of Treitz. No small  bowel wall thickening. No small bowel dilatation. The terminal ileum is normal. The appendix is normal. No gross colonic mass. No colonic wall thickening. Diverticular changes are noted in the left colon without evidence of diverticulitis. Vascular/Lymphatic: There is abdominal aortic atherosclerosis without aneurysm. No hepatoduodenal ligament or gastrohepatic ligament lymphadenopathy. 2.9 x 1.9 cm ill-defined necrotic central mesenteric lymph node visible in the left paramidline abdomen (image 74/series 2). A second necrotic mesenteric mass is identified in the anterior left abdomen, measuring 5.9 x 4.4 x 5.9 cm on image 79/2. This particular mass is intimately associated with a small bowel loop that stretches around the periphery of the lesion. It is unclear whether this is mesenteric in origin or related to the small bowel. No pelvic sidewall lymphadenopathy. Reproductive: Prostate gland is enlarged with fiducial markers evident. Other: No intraperitoneal free fluid. Musculoskeletal: No worrisome lytic or sclerotic osseous abnormality. IMPRESSION: 1. Within the right suprahilar scarring, there has been interval development of a 2.9 x 2.5 cm central masslike component highly concerning for recurrent disease. PET-CT recommended to further evaluate. 2. 5.9 x 4.4 x 5.9 cm necrotic mesenteric mass in the anterior left abdomen, intimately associated with a small bowel loop that stretches around the periphery of the lesion. Lesion causes no obstruction at this time. Metastatic disease would be a consideration. Small bowel primary including lymphoma would also be a consideration although considered less likely. 3. 2.9 x 1.9 cm ill-defined necrotic central mesenteric lymph node, consistent with metastatic disease. 4. Cholelithiasis. 5. Left colonic diverticulosis without diverticulitis. 6. Prostatomegaly. 7. Aortic Atherosclerosis (ICD10-I70.0). Electronically Signed   By: Misty Stanley M.D.   On: 11/05/2019 08:24   CT  Abdomen Pelvis W Contrast  Result Date: 11/05/2019 CLINICAL DATA:  Non-small-cell lung cancer.  Restaging. EXAM: CT CHEST, ABDOMEN, AND PELVIS WITH CONTRAST TECHNIQUE: Multidetector CT imaging of the chest, abdomen and pelvis was performed following the standard protocol during bolus administration of intravenous contrast. CONTRAST:  133m OMNIPAQUE IOHEXOL 300 MG/ML  SOLN COMPARISON:  Chest CT 08/10/2019. FINDINGS: CT CHEST FINDINGS Cardiovascular: The heart size is normal. No substantial pericardial effusion. Coronary artery calcification is evident. Atherosclerotic calcification is noted in the wall of the thoracic aorta. Mediastinum/Nodes: No mediastinal lymphadenopathy. There is no hilar lymphadenopathy. There is no axillary lymphadenopathy. The esophagus has normal imaging features. Lungs/Pleura: Collapse/consolidative opacity in the suprahilar right upper lobe again noted, compatible with previous radiation therapy. Interval development of a central masslike component measuring 2.9 x 2.5 cm on image 47/6 displaces adjacent airways and is highly concerning for recurrent disease. No change in lingular scarring. The tiny left perifissural nodule seen on image 70/6 today is stable. No pleural effusion. Musculoskeletal: No worrisome lytic or sclerotic osseous abnormality. CT ABDOMEN PELVIS FINDINGS Hepatobiliary: Peripheral low-density wedge-shaped lesion in the posterior right liver with associated dystrophic calcification is stable. Subtle differential attenuation in the inferior liver may be related to fatty deposition. Tiny calcified gallstone evident. No intrahepatic or  extrahepatic biliary dilation. Pancreas: No focal mass lesion. No dilatation of the main duct. No intraparenchymal cyst. No peripancreatic edema. Spleen: No splenomegaly. No focal mass lesion. Adrenals/Urinary Tract: Similar appearance mild adrenal thickening. Right kidney unremarkable. Interpolar left renal cyst noted. No evidence for  hydroureter. Subtle irregularity of the bladder wall is nonspecific but may be related to trabeculation. Stomach/Bowel: Tiny hiatal hernia. Stomach otherwise unremarkable. Duodenum is normally positioned as is the ligament of Treitz. No small bowel wall thickening. No small bowel dilatation. The terminal ileum is normal. The appendix is normal. No gross colonic mass. No colonic wall thickening. Diverticular changes are noted in the left colon without evidence of diverticulitis. Vascular/Lymphatic: There is abdominal aortic atherosclerosis without aneurysm. No hepatoduodenal ligament or gastrohepatic ligament lymphadenopathy. 2.9 x 1.9 cm ill-defined necrotic central mesenteric lymph node visible in the left paramidline abdomen (image 74/series 2). A second necrotic mesenteric mass is identified in the anterior left abdomen, measuring 5.9 x 4.4 x 5.9 cm on image 79/2. This particular mass is intimately associated with a small bowel loop that stretches around the periphery of the lesion. It is unclear whether this is mesenteric in origin or related to the small bowel. No pelvic sidewall lymphadenopathy. Reproductive: Prostate gland is enlarged with fiducial markers evident. Other: No intraperitoneal free fluid. Musculoskeletal: No worrisome lytic or sclerotic osseous abnormality. IMPRESSION: 1. Within the right suprahilar scarring, there has been interval development of a 2.9 x 2.5 cm central masslike component highly concerning for recurrent disease. PET-CT recommended to further evaluate. 2. 5.9 x 4.4 x 5.9 cm necrotic mesenteric mass in the anterior left abdomen, intimately associated with a small bowel loop that stretches around the periphery of the lesion. Lesion causes no obstruction at this time. Metastatic disease would be a consideration. Small bowel primary including lymphoma would also be a consideration although considered less likely. 3. 2.9 x 1.9 cm ill-defined necrotic central mesenteric lymph node,  consistent with metastatic disease. 4. Cholelithiasis. 5. Left colonic diverticulosis without diverticulitis. 6. Prostatomegaly. 7. Aortic Atherosclerosis (ICD10-I70.0). Electronically Signed   By: Misty Stanley M.D.   On: 11/05/2019 08:24   NM PET Image Restag (PS) Skull Base To Thigh  Result Date: 11/08/2019 CLINICAL DATA:  Subsequent treatment strategy for lung cancer, thyroid cancer, prostate cancer with new right lung nodule, new large mesenteric mass, and new mesenteric adenopathy. EXAM: NUCLEAR MEDICINE PET SKULL BASE TO THIGH TECHNIQUE: 10.0 mCi F-18 FDG was injected intravenously. Full-ring PET imaging was performed from the skull base to thigh after the radiotracer. CT data was obtained and used for attenuation correction and anatomic localization. Fasting blood glucose: 125 mg/dl COMPARISON:  Multiple exams, including 02/23/2019 and CT scan from 11/05/2018 FINDINGS: Mediastinal blood pool activity: SUV max 2.8 Liver activity: SUV max 4.2 NECK: No significant abnormal hypermetabolic activity in this region. Incidental CT findings: Chronic right ethmoid sinusitis. Bilateral common carotid atherosclerotic calcification. Thyroidectomy. CHEST: The 3.1 cm right upper lobe suprahilar mass has surrounding atelectasis and a maximum SUV of 10.1 (previously 14.8 in this region on prior PET-CT of 02/23/2019). Indistinct asleep bandlike lingular density observed, maximum SUV 1.2. Incidental CT findings: Coronary, aortic arch, and branch vessel atherosclerotic vascular disease. Mild cardiomegaly. ABDOMEN/PELVIS: Three hypermetabolic lesions in the liver likely reflect metastatic disease. Hypermetabolic activity of the lesion inferiorly in the right hepatic lobe measures about 2.1 cm in diameter, with maximum SUV of 7.2 2 other lesions are just above the gallbladder fossa and smaller. These were not present on prior PET-CT.  The dominant and centrally necrotic hypermetabolic mass in the left small bowel mesentery  measuring approximately 6.2 cm in long axis has maximum SUV of 8.6. Cephalad to this level, a separate mesenteric mass measuring 2.5 by 1.8 cm on image 131/4 has maximum SUV 8.9. These were not present on prior PET-CT. Incidental CT findings: Cholelithiasis. Aortoiliac atherosclerotic vascular disease. Left mid kidney cyst posteriorly. Sigmoid colon diverticulosis. Considerable prostatomegaly with three small metal markers along the prostate gland. SKELETON: No significant abnormal hypermetabolic activity in this region. Incidental CT findings: none IMPRESSION: 1. Malignancy recurrence in the right suprahilar region, or a 3.1 cm mass has maximum SUV of 10.1. 2. Two hypermetabolic left eccentric mesenteric masses along the small bowel mesentery, along with 3 new hypermetabolic lesions in the liver, compatible with malignancy. 3. Other imaging findings of potential clinical significance: Chronic right ethmoid sinusitis. Aortic Atherosclerosis (ICD10-I70.0). Coronary atherosclerosis with mild cardiomegaly. Cholelithiasis. Sigmoid colon diverticulosis. Prostatomegaly. Electronically Signed   By: Van Clines M.D.   On: 11/08/2019 09:11     ASSESSMENT/PLAN:  Metastatic non-small cell lung cancer, likely adenocarcinoma initially diagnosed as stage IIIA (T2b, N2, M0). Presented with right upper lobe lung mass in addition to right hilar and mediastinal lymphadenopathy diagnosed in September 2020. Showed disease progression with metastatic disease with Malignancy recurrence in the right suprahilar region and Two hypermetabolic left eccentric mesenteric masses along the small bowel mesentery, along with 3 new hypermetabolic lesions in the liver, compatible with malignancy in June 2021.  The patient does not have any actionable mutations.  The patient completed a course of concurrent chemoradiation with weekly carboplatin for an AUC of 2 and paclitaxel 45 mg/m.  He status post 7 cycles.  He tolerated treatment  well except for fatigue and a dry cough.  He had a partial response to treatment.  The patient then underwent consolidation immunotherapy with Imfinzi 200 mg IV every 4 weeks.  The patient is status post 6 cycles.  He recently showed evidence of disease progression with new local disease recurrence as well as metastatic disease to the liver and a suspicious mass along the small bowel mesentery.  Was recently referred to gastroenterology to further evaluate this lesion.  The patient was seen with Dr. Julien Nordmann today.  Dr. Julien Nordmann had a lengthy discussion with the patient about his current condition and recommendations.  Dr. Julien Nordmann recommends __ .  The patient is interested in this option as he is expected to receive his first dose of treatment On__.   The adverse side effects of treatment were discussed including but not limited to __   We will see the patient back for follow-up visit On_.   The patient will see Dr. __GI on__as scheduled for a repeat colonoscopy.  The patient will continue to follow with endocrinology due to his history of a total thyroidectomy.  The patient was advised to call immediately if he has any concerning symptoms in the interval. The patient voices understanding of current disease status and treatment options and is in agreement with the current care plan. All questions were answered. The patient knows to call the clinic with any problems, questions or concerns. We can certainly see the patient much sooner if necessary     No orders of the defined types were placed in this encounter.    Caiya Bettes L Avangeline Stockburger, PA-C 11/25/19

## 2019-11-27 ENCOUNTER — Other Ambulatory Visit: Payer: Self-pay

## 2019-11-27 ENCOUNTER — Telehealth: Payer: Self-pay | Admitting: *Deleted

## 2019-11-27 ENCOUNTER — Telehealth: Payer: Self-pay | Admitting: Medical Oncology

## 2019-11-27 ENCOUNTER — Inpatient Hospital Stay: Payer: Medicare Other | Attending: Internal Medicine | Admitting: Physician Assistant

## 2019-11-27 ENCOUNTER — Inpatient Hospital Stay: Payer: Medicare Other

## 2019-11-27 DIAGNOSIS — Z5111 Encounter for antineoplastic chemotherapy: Secondary | ICD-10-CM | POA: Insufficient documentation

## 2019-11-27 DIAGNOSIS — K219 Gastro-esophageal reflux disease without esophagitis: Secondary | ICD-10-CM | POA: Insufficient documentation

## 2019-11-27 DIAGNOSIS — I2510 Atherosclerotic heart disease of native coronary artery without angina pectoris: Secondary | ICD-10-CM | POA: Insufficient documentation

## 2019-11-27 DIAGNOSIS — D6481 Anemia due to antineoplastic chemotherapy: Secondary | ICD-10-CM | POA: Insufficient documentation

## 2019-11-27 DIAGNOSIS — C3411 Malignant neoplasm of upper lobe, right bronchus or lung: Secondary | ICD-10-CM | POA: Insufficient documentation

## 2019-11-27 DIAGNOSIS — I10 Essential (primary) hypertension: Secondary | ICD-10-CM | POA: Insufficient documentation

## 2019-11-27 DIAGNOSIS — Z85850 Personal history of malignant neoplasm of thyroid: Secondary | ICD-10-CM | POA: Insufficient documentation

## 2019-11-27 DIAGNOSIS — Z5112 Encounter for antineoplastic immunotherapy: Secondary | ICD-10-CM | POA: Insufficient documentation

## 2019-11-27 DIAGNOSIS — E785 Hyperlipidemia, unspecified: Secondary | ICD-10-CM | POA: Insufficient documentation

## 2019-11-27 DIAGNOSIS — Z923 Personal history of irradiation: Secondary | ICD-10-CM | POA: Insufficient documentation

## 2019-11-27 DIAGNOSIS — C787 Secondary malignant neoplasm of liver and intrahepatic bile duct: Secondary | ICD-10-CM | POA: Insufficient documentation

## 2019-11-27 DIAGNOSIS — Z79899 Other long term (current) drug therapy: Secondary | ICD-10-CM | POA: Insufficient documentation

## 2019-11-27 DIAGNOSIS — C61 Malignant neoplasm of prostate: Secondary | ICD-10-CM | POA: Insufficient documentation

## 2019-11-27 DIAGNOSIS — G893 Neoplasm related pain (acute) (chronic): Secondary | ICD-10-CM | POA: Insufficient documentation

## 2019-11-27 DIAGNOSIS — C786 Secondary malignant neoplasm of retroperitoneum and peritoneum: Secondary | ICD-10-CM | POA: Insufficient documentation

## 2019-11-27 MED ORDER — EZETIMIBE 10 MG PO TABS: 10.0000 mg | ORAL_TABLET | Freq: Every day | ORAL | 2 refills | Status: AC

## 2019-11-27 NOTE — Telephone Encounter (Signed)
Patient was unaware of appointments today 11/27/2019.   Did confirm he had colonoscopy 11/23/2019.  Spoke with Melissa at [facility].  She confirmed it would be back most likely at the beginning of the week.  She stated they removed 4 small polys, confirmed diverticulosis and internal hemorrhoids.  No obvious concern on report.  She will fax final report over to our office.    Need to reschedule visit for today depending on Cassie's recommendation since Dr. Worthy Flank note mentioned a preference to have colonoscopy completed before he was seen.

## 2019-11-27 NOTE — Telephone Encounter (Signed)
He got mixed up about appt today because of procedure he had last week.   When does Dr Julien Nordmann want to see me?  He is only available this Friday .   Next appt is July 15th .

## 2019-11-27 NOTE — Telephone Encounter (Signed)
I need to see him after his evaluation by gastroenterology.  Thank you.

## 2019-11-28 ENCOUNTER — Telehealth: Payer: Self-pay | Admitting: Medical Oncology

## 2019-11-28 ENCOUNTER — Telehealth: Payer: Self-pay | Admitting: Internal Medicine

## 2019-11-28 DIAGNOSIS — D124 Benign neoplasm of descending colon: Secondary | ICD-10-CM | POA: Diagnosis not present

## 2019-11-28 DIAGNOSIS — D123 Benign neoplasm of transverse colon: Secondary | ICD-10-CM | POA: Diagnosis not present

## 2019-11-28 NOTE — Telephone Encounter (Signed)
Scheduled apt per 7/7 sch msg - pt aware of appt date and time

## 2019-11-29 NOTE — Telephone Encounter (Signed)
Schedule message sent to move up appt to early next week.

## 2019-11-29 NOTE — Telephone Encounter (Signed)
F/U- Pt has not heard from Dr Michail Sermon re: biopsy .   Because of this , I cancelled appt with [facility] tomorrow and pt understands.  I told pt to keep Carl Harrell updated and to keep appt next week.

## 2019-11-30 ENCOUNTER — Telehealth: Payer: Self-pay | Admitting: Internal Medicine

## 2019-11-30 ENCOUNTER — Inpatient Hospital Stay: Payer: Medicare Other | Admitting: Physician Assistant

## 2019-11-30 ENCOUNTER — Inpatient Hospital Stay: Payer: Medicare Other

## 2019-11-30 NOTE — Telephone Encounter (Signed)
Scheduled appt per 7/8 sch msg - pt aware of new appt date and time

## 2019-12-01 NOTE — Progress Notes (Signed)
[facility] OFFICE PROGRESS NOTE  Carl Ruddy, MD [place] [address]  DIAGNOSIS:  1) Metastatic non-small cell lung cancer, likely adenocarcinoma initially diagnosed as stage IIIA (T2b, N2, M0). Presented with right upper lobe lung mass in addition to right hilar and mediastinal lymphadenopathy diagnosed in September 2020. Showed disease progression with metastatic disease with Malignancy recurrence in the right suprahilar region and Two hypermetabolic left eccentric mesenteric masses along the small bowel mesentery, along with 3 new hypermetabolic lesions in the liver, compatible with malignancy in June 2021.  2) history of prostate adenocarcinoma status post radiotherapy under the care of Carl Harrell. 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  Molecular Biomarkers: [id number] Everolimus, Temsirolimus Yes 4.9% [id number] None Yes 4.7% [id number] Yes 0.2% [id number] None No 5.2% [id number] None No 5.0% [id number] None (VUS) No (VUS) 4.4% [id number] (VUS) No (VUS) 3.5% MSI-HighNOT DETECTED  PRIOR THERAPY: 1) Concurrent chemoradiation with weekly carboplatin for AUC of 2 and paclitaxel 45 mg/M2.  First dose March 05, 2019.  Status post 7 cycles.  Last dose was given April 16, 2019 with partial response. 2)Total thyroidectomy on February 1, 24097for follicular neoplasm of the thyroid under the care of Dr. GHarlow Asa3) Consolidation immunotherapy with immunotherapy with Imfinzi 1500 mg IV every 4 weeks. Last dose 10/11/19.   CURRENT THERAPY: Systemic chemotherapy with Carboplatin for an AUC of 5, Alimta 500 mg/m2, and Keytruda 200 mg IV every 3 weeks. First dose expected on 12/13/2019.   INTERVAL HISTORY: Carl [id number].[id number]. male returns to the clinic today for a follow-up visit.  The patient is feeling fair today without  any concerning complaints except for intermittent epigastric pain which he characterizes as a "bloated" sensation. He also reports associated weight loss. Denies any recent nausea and vomiting. The patient's last cycle of consolidation immunotherapy with Imfinzi was on 10/11/2019.  In the interval since his last treatment, the patient has been endorsing increased abdominal pain, gas, nausea, and vomiting.  A CT scan of the abdomen and pelvis was added onto his restaging CT scan which noted evidence of disease progression with 3 hypermetabolic lesions in the liver.  The scan also noted disease recurrence in the suprahilar region.  Additionally the scan noted 2 hypermetabolic left eccentric mesenteric masses along the small bowel mesentery.  The patient had fecal occult stool cards which were positive for blood.  The patient had a referral placed to gastroenterology.  The patient had an appointment with them on 11/20/2019 with [facility]. The patient had an upper endoscopy and colonoscopy which was unremarkable except for a few colon polyps. No masses were appreciated.   Otherwise today, the patient denies any recent fever, chills, or night sweats. He reports fatigue. He lost about 2-3 lbs since his last appointment.  Denies any chest pain, shortness of breath, cough, or hemoptysis. Denies any nausea, vomiting, diarrhea, or constipation. He denies any headache or visual changes.  The patient is here today for evaluation and a more detailed discussion about his current condition and recommendations.  MEDICAL HISTORY: Past Medical History:  Diagnosis Date  . ALLERGIC RHINITIS 01/30/2007  . Arthritis    "hands" (05/29/2014)  . [place], prostate  . Constipation   . Coronary artery disease    LHC (05/29/14): pLAD 50-70, pRCA 99 (L>R collats), EF 55% >> PCI: 3.5 x 28 mm Xience  [facility]  . Cough secondary to angiotensin converting enzyme inhibitor (ACE-I) 05/28/2013   . ED (erectile dysfunction)   . GERD 09/26/2007  . HYPERLIPIDEMIA 09/26/2007  . Hypertension   . Lung cancer ([place]) dx'd 03/2018  . PLANTAR FASCIITIS, BILATERAL 07/08/2009  . Pre-diabetes   . Prostate CA ([place]) dx'd 2017 or 2018   xrt  . Unspecified hearing loss 06/16/2009   wears aides both ears    ALLERGIES:  is allergic to lisinopril.  MEDICATIONS:  Current Outpatient Medications  Medication Sig Dispense Refill  . calcium carbonate (TUMS - DOSED IN MG ELEMENTAL CALCIUM) 500 MG chewable tablet Chew 1 tablet by mouth daily.    . cetirizine (ZYRTEC) 10 MG tablet Take 1 tablet (10 mg total) by mouth daily. (Patient not taking: Reported on 10/25/2019) 30 tablet 11  . ezetimibe (ZETIA) 10 MG tablet TAKE 1 TABLET BY MOUTH EVERY DAY (Patient taking differently: Take 10 mg by mouth at bedtime. ) 90 tablet 3  . fluticasone (FLONASE) 50 MCG/ACT nasal spray SHAKE LIQUID AND USE 1 SPRAY IN EACH NOSTRIL DAILY (Patient taking differently: Place 1 spray into both nostrils daily. ) 48 g 0  . levothyroxine (SYNTHROID) 100 MCG tablet Take 1 tablet (100 mcg total) by mouth daily. 90 tablet 3  . losartan (COZAAR) 50 MG tablet Take 1 tablet (50 mg total) by mouth daily. Please keep upcoming appt in April with Dr. Radford Pax before anymore refills. Thank you 90 tablet 0  . metoprolol tartrate (LOPRESSOR) 25 MG tablet TAKE 1 TABLET BY MOUTH TWICE DAILY 180 tablet 0  . Multiple Vitamin (MULTIVITAMIN WITH MINERALS) TABS tablet Take 1 tablet by mouth daily.    Marland Kitchen omeprazole (PRILOSEC) 40 MG capsule Take 1 capsule (40 mg total) by mouth 2 (two) times daily. 60 capsule 3  . simvastatin (ZOCOR) 40 MG tablet TAKE 1 TABLET(40 MG) BY MOUTH DAILY AT 6 PM (Patient taking differently: Take 40 mg by mouth daily at 6 PM. ) 90 tablet 3  . vitamin B-12 (CYANOCOBALAMIN) 50 MCG tablet Take 50 mcg by mouth daily.     No current facility-administered medications for this visit.    SURGICAL HISTORY:  Past Surgical History:  Procedure  Laterality Date  . CARDIAC CATHETERIZATION  05/29/2014   Procedure: CORONARY STENT INTERVENTION;  Surgeon: Sinclair Grooms, MD;  Location: [facility] CATH LAB;  Service: Cardiovascular;;  Prox RCA  . COLONOSCOPY    . CORONARY ANGIOPLASTY WITH STENT PLACEMENT  05/29/2014   "1"  . KNEE ARTHROSCOPY Right ~ 2005  . LEFT HEART CATHETERIZATION WITH CORONARY ANGIOGRAM N/A 05/29/2014   Procedure: LEFT HEART CATHETERIZATION WITH CORONARY ANGIOGRAM;  Surgeon: Sinclair Grooms, MD;  Location: [facility] CATH LAB;  Service: Cardiovascular;  Laterality: N/A;  . THYROIDECTOMY N/A 06/25/2019   Procedure: TOTAL THYROIDECTOMY;  Surgeon: Armandina Gemma, MD;  Location: [facility] ORS;  Service: General;  Laterality: N/A;  . VIDEO BRONCHOSCOPY WITH ENDOBRONCHIAL ULTRASOUND N/A 02/20/2019   Procedure: VIDEO BRONCHOSCOPY WITH ENDOBRONCHIAL ULTRASOUND;  Surgeon: Garner Nash, DO;  Location: [place];  Service: Thoracic;  Laterality: N/A;    REVIEW OF SYSTEMS:   Review of Systems  Constitutional: Positive for fatigue, decreased appetite, and weight loss. Negative for chills fever. HENT: Negative for mouth sores, nosebleeds, sore throat and trouble swallowing.   Eyes: Negative for eye problems and icterus.  Respiratory: Negative for cough, hemoptysis, shortness of breath and wheezing.   Cardiovascular: Negative for chest pain and leg swelling.  Gastrointestinal: Positive for abdominal pain, bloating, and reflux. Negative for constipation, diarrhea, nausea and vomiting.  Genitourinary: Negative for bladder incontinence, difficulty urinating, dysuria, frequency and hematuria.   Musculoskeletal: Negative for back pain, gait problem, neck pain and neck stiffness.  Skin: Negative for itching and rash.  Neurological: Negative for dizziness, extremity weakness, gait problem, headaches, light-headedness and seizures.  Hematological: Negative for adenopathy. Does not bruise/bleed easily.  Psychiatric/Behavioral: Negative for confusion, depression and  sleep disturbance. The patient is not nervous/anxious.     PHYSICAL EXAMINATION:  There were no vitals taken for this visit.  ECOG PERFORMANCE STATUS: 1  Physical Exam  Constitutional: Oriented to person, place, and time and well-developed, well-nourished, and in no distress.  HENT:  Head: Normocephalic and atraumatic.  Mouth/Throat: Oropharynx is clear and moist. No oropharyngeal exudate.  Eyes: Conjunctivae are normal. Right eye exhibits no discharge. Left eye exhibits no discharge. No scleral icterus.  Neck: Normal range of motion. Neck supple.  Cardiovascular: Normal rate, regular rhythm, normal heart sounds and intact distal pulses.   Pulmonary/Chest: Effort normal and breath sounds normal. No respiratory distress. No wheezes. No rales.  Abdominal: Soft. Bowel sounds are normal. Exhibits no distension and no mass. There is no tenderness.  Musculoskeletal: Normal range of motion. Exhibits no edema.  Lymphadenopathy:    No cervical adenopathy.  Neurological: Alert and oriented to person, place, and time. Exhibits normal muscle tone. Gait normal. Coordination normal.  Skin: Skin is warm and dry. No rash noted. Not diaphoretic. No erythema. No pallor.  Psychiatric: Mood, memory and judgment normal.  Vitals reviewed.  LABORATORY DATA: Lab Results  Component Value Date   WBC 8.0 11/13/2019   HGB 12.1 (L) 11/13/2019   HCT 37.9 (L) 11/13/2019   MCV 85.4 11/13/2019   PLT 287 11/13/2019      Chemistry      Component Value Date/Time   NA 139 11/13/2019 0843   NA 141 08/05/2016 0826   K 4.6 11/13/2019 0843   CL 106 11/13/2019 0843   CO2 23 11/13/2019 0843   BUN 10 11/13/2019 0843   BUN 14 08/05/2016 0826   CREATININE 1.26 (H) 11/13/2019 0843      Component Value Date/Time   CALCIUM 9.1 11/13/2019 0843   ALKPHOS 75 11/13/2019 0843   AST 17 11/13/2019 0843   ALT 20 11/13/2019 0843   BILITOT 0.6 11/13/2019 0843       RADIOGRAPHIC STUDIES:  CT Chest W  Contrast  Result Date: 11/05/2019 CLINICAL DATA:  Non-small-cell lung cancer.  Restaging. EXAM: CT CHEST, ABDOMEN, AND PELVIS WITH CONTRAST TECHNIQUE: Multidetector CT imaging of the chest, abdomen and pelvis was performed following the standard protocol during bolus administration of intravenous contrast. CONTRAST:  OMNIPAQUE IOHEXOL 300 MG/ML  SOLN COMPARISON:  Chest CT 08/10/2019. FINDINGS: CT CHEST FINDINGS Cardiovascular: The heart size is normal. No substantial pericardial effusion. Coronary artery calcification is evident. Atherosclerotic calcification is noted in the wall of the thoracic aorta. Mediastinum/Nodes: No mediastinal lymphadenopathy. There is no hilar lymphadenopathy. There is no axillary lymphadenopathy. The esophagus has normal imaging features. Lungs/Pleura: Collapse/consolidative opacity in the suprahilar right upper lobe again noted, compatible with previous radiation therapy. Interval development of a central masslike component measuring 2.9 x 2.5 cm on image 47/6 displaces adjacent airways and is highly concerning for recurrent disease. No change in lingular scarring. The tiny left perifissural nodule seen on image 70/6 today is stable. No pleural effusion. Musculoskeletal: No worrisome lytic or sclerotic osseous abnormality. CT  ABDOMEN PELVIS FINDINGS Hepatobiliary: Peripheral low-density wedge-shaped lesion in the posterior right liver with associated dystrophic calcification is stable. Subtle differential attenuation in the inferior liver may be related to fatty deposition. Tiny calcified gallstone evident. No intrahepatic or extrahepatic biliary dilation. Pancreas: No focal mass lesion. No dilatation of the main duct. No intraparenchymal cyst. No peripancreatic edema. Spleen: No splenomegaly. No focal mass lesion. Adrenals/Urinary Tract: Similar appearance mild adrenal thickening. Right kidney unremarkable. Interpolar left renal cyst noted. No evidence for hydroureter. Subtle  irregularity of the bladder wall is nonspecific but may be related to trabeculation. Stomach/Bowel: Tiny hiatal hernia. Stomach otherwise unremarkable. Duodenum is normally positioned as is the ligament of Treitz. No small bowel wall thickening. No small bowel dilatation. The terminal ileum is normal. The appendix is normal. No gross colonic mass. No colonic wall thickening. Diverticular changes are noted in the left colon without evidence of diverticulitis. Vascular/Lymphatic: There is abdominal aortic atherosclerosis without aneurysm. No hepatoduodenal ligament or gastrohepatic ligament lymphadenopathy. 2.9 x 1.9 cm ill-defined necrotic central mesenteric lymph node visible in the left paramidline abdomen (image 74/series 2). A second necrotic mesenteric mass is identified in the anterior left abdomen, measuring 5.9 x 4.4 x 5.9 cm on image 79/2. This particular mass is intimately associated with a small bowel loop that stretches around the periphery of the lesion. It is unclear whether this is mesenteric in origin or related to the small bowel. No pelvic sidewall lymphadenopathy. Reproductive: Prostate gland is enlarged with fiducial markers evident. Other: No intraperitoneal free fluid. Musculoskeletal: No worrisome lytic or sclerotic osseous abnormality. IMPRESSION: 1. Within the right suprahilar scarring, there has been interval development of a 2.9 x 2.5 cm central masslike component highly concerning for recurrent disease. PET-CT recommended to further evaluate. 2. 5.9 x 4.4 x 5.9 cm necrotic mesenteric mass in the anterior left abdomen, intimately associated with a small bowel loop that stretches around the periphery of the lesion. Lesion causes no obstruction at this time. Metastatic disease would be a consideration. Small bowel primary including lymphoma would also be a consideration although considered less likely. 3. 2.9 x 1.9 cm ill-defined necrotic central mesenteric lymph node, consistent with  metastatic disease. 4. Cholelithiasis. 5. Left colonic diverticulosis without diverticulitis. 6. Prostatomegaly. 7. Aortic Atherosclerosis (ICD10-I70.0). Electronically Signed   By: Misty Stanley M.D.   On: 11/05/2019 08:24   CT Abdomen Pelvis W Contrast  Result Date: 11/05/2019 CLINICAL DATA:  Non-small-cell lung cancer.  Restaging. EXAM: CT CHEST, ABDOMEN, AND PELVIS WITH CONTRAST TECHNIQUE: Multidetector CT imaging of the chest, abdomen and pelvis was performed following the standard protocol during bolus administration of intravenous contrast. CONTRAST:  164m OMNIPAQUE IOHEXOL 300 MG/ML  SOLN COMPARISON:  Chest CT 08/10/2019. FINDINGS: CT CHEST FINDINGS Cardiovascular: The heart size is normal. No substantial pericardial effusion. Coronary artery calcification is evident. Atherosclerotic calcification is noted in the wall of the thoracic aorta. Mediastinum/Nodes: No mediastinal lymphadenopathy. There is no hilar lymphadenopathy. There is no axillary lymphadenopathy. The esophagus has normal imaging features. Lungs/Pleura: Collapse/consolidative opacity in the suprahilar right upper lobe again noted, compatible with previous radiation therapy. Interval development of a central masslike component measuring 2.9 x 2.5 cm on image 47/6 displaces adjacent airways and is highly concerning for recurrent disease. No change in lingular scarring. The tiny left perifissural nodule seen on image 70/6 today is stable. No pleural effusion. Musculoskeletal: No worrisome lytic or sclerotic osseous abnormality. CT ABDOMEN PELVIS FINDINGS Hepatobiliary: Peripheral low-density wedge-shaped lesion in the posterior right liver with  associated dystrophic calcification is stable. Subtle differential attenuation in the inferior liver may be related to fatty deposition. Tiny calcified gallstone evident. No intrahepatic or extrahepatic biliary dilation. Pancreas: No focal mass lesion. No dilatation of the main duct. No  intraparenchymal cyst. No peripancreatic edema. Spleen: No splenomegaly. No focal mass lesion. Adrenals/Urinary Tract: Similar appearance mild adrenal thickening. Right kidney unremarkable. Interpolar left renal cyst noted. No evidence for hydroureter. Subtle irregularity of the bladder wall is nonspecific but may be related to trabeculation. Stomach/Bowel: Tiny hiatal hernia. Stomach otherwise unremarkable. Duodenum is normally positioned as is the ligament of Treitz. No small bowel wall thickening. No small bowel dilatation. The terminal ileum is normal. The appendix is normal. No gross colonic mass. No colonic wall thickening. Diverticular changes are noted in the left colon without evidence of diverticulitis. Vascular/Lymphatic: There is abdominal aortic atherosclerosis without aneurysm. No hepatoduodenal ligament or gastrohepatic ligament lymphadenopathy. 2.9 x 1.9 cm ill-defined necrotic central mesenteric lymph node visible in the left paramidline abdomen (image 74/series 2). A second necrotic mesenteric mass is identified in the anterior left abdomen, measuring 5.9 x 4.4 x 5.9 cm on image 79/2. This particular mass is intimately associated with a small bowel loop that stretches around the periphery of the lesion. It is unclear whether this is mesenteric in origin or related to the small bowel. No pelvic sidewall lymphadenopathy. Reproductive: Prostate gland is enlarged with fiducial markers evident. Other: No intraperitoneal free fluid. Musculoskeletal: No worrisome lytic or sclerotic osseous abnormality. IMPRESSION: 1. Within the right suprahilar scarring, there has been interval development of a 2.9 x 2.5 cm central masslike component highly concerning for recurrent disease. PET-CT recommended to further evaluate. 2. 5.9 x 4.4 x 5.9 cm necrotic mesenteric mass in the anterior left abdomen, intimately associated with a small bowel loop that stretches around the periphery of the lesion. Lesion causes no  obstruction at this time. Metastatic disease would be a consideration. Small bowel primary including lymphoma would also be a consideration although considered less likely. 3. 2.9 x 1.9 cm ill-defined necrotic central mesenteric lymph node, consistent with metastatic disease. 4. Cholelithiasis. 5. Left colonic diverticulosis without diverticulitis. 6. Prostatomegaly. 7. Aortic Atherosclerosis (ICD10-I70.0). Electronically Signed   By: Kennith Center M.D.   On: 11/05/2019 08:24   NM PET Image Restag (PS) Skull Base To Thigh  Result Date: 11/08/2019 CLINICAL DATA:  Subsequent treatment strategy for lung cancer, thyroid cancer, prostate cancer with new right lung nodule, new large mesenteric mass, and new mesenteric adenopathy. EXAM: NUCLEAR MEDICINE PET SKULL BASE TO THIGH TECHNIQUE: 10.0 mCi F-18 FDG was injected intravenously. Full-ring PET imaging was performed from the skull base to thigh after the radiotracer. CT data was obtained and used for attenuation correction and anatomic localization. Fasting blood glucose: 125 mg/dl COMPARISON:  Multiple exams, including 02/23/2019 and CT scan from 11/05/2018 FINDINGS: Mediastinal blood pool activity: SUV max 2.8 Liver activity: SUV max 4.2 NECK: No significant abnormal hypermetabolic activity in this region. Incidental CT findings: Chronic right ethmoid sinusitis. Bilateral common carotid atherosclerotic calcification. Thyroidectomy. CHEST: The 3.1 cm right upper lobe suprahilar mass has surrounding atelectasis and a maximum SUV of 10.1 (previously 14.8 in this region on prior PET-CT of 02/23/2019). Indistinct asleep bandlike lingular density observed, maximum SUV 1.2. Incidental CT findings: Coronary, aortic arch, and branch vessel atherosclerotic vascular disease. Mild cardiomegaly. ABDOMEN/PELVIS: Three hypermetabolic lesions in the liver likely reflect metastatic disease. Hypermetabolic activity of the lesion inferiorly in the right hepatic lobe measures about  2.1  cm in diameter, with maximum SUV of 7.2 2 other lesions are just above the gallbladder fossa and smaller. These were not present on prior PET-CT. The dominant and centrally necrotic hypermetabolic mass in the left small bowel mesentery measuring approximately 6.2 cm in long axis has maximum SUV of 8.6. Cephalad to this level, a separate mesenteric mass measuring 2.5 by 1.8 cm on image 131/4 has maximum SUV 8.9. These were not present on prior PET-CT. Incidental CT findings: Cholelithiasis. Aortoiliac atherosclerotic vascular disease. Left mid kidney cyst posteriorly. Sigmoid colon diverticulosis. Considerable prostatomegaly with three small metal markers along the prostate gland. SKELETON: No significant abnormal hypermetabolic activity in this region. Incidental CT findings: none IMPRESSION: 1. Malignancy recurrence in the right suprahilar region, or a 3.1 cm mass has maximum SUV of 10.1. 2. Two hypermetabolic left eccentric mesenteric masses along the small bowel mesentery, along with 3 new hypermetabolic lesions in the liver, compatible with malignancy. 3. Other imaging findings of potential clinical significance: Chronic right ethmoid sinusitis. Aortic Atherosclerosis (ICD10-I70.0). Coronary atherosclerosis with mild cardiomegaly. Cholelithiasis. Sigmoid colon diverticulosis. Prostatomegaly. Electronically Signed   By: Van Clines M.D.   On: 11/08/2019 09:11     ASSESSMENT/PLAN:  This is a very pleasant 74 year old Caucasian male diagnosed with metastatic non-small cell lung cancer, likely adenocarcinoma initially diagnosed as stage IIIA (T2b, N2, M0). Presented with right upper lobe lung mass in addition to right hilar and mediastinal lymphadenopathy diagnosed in September 2020. Showed disease progression with metastatic disease recurrence in the right suprahilar region and two hypermetabolic left eccentric mesenteric masses along the small bowel mesentery, along with 3 new hypermetabolic  lesions in the liver, compatible with malignancy in June 2021.  The patient does not have any actionable mutations.  The patient completed a course of concurrent chemoradiation with weekly carboplatin for an AUC of 2 and paclitaxel 45 mg/m.  He status post 7 cycles.  He tolerated treatment well except for fatigue and a dry cough.  He had a partial response to treatment.  The patient then underwent consolidation immunotherapy with Imfinzi 200 mg IV every 4 weeks.  The patient is status post 6 cycles.  He recently showed evidence of disease progression with new local disease recurrence as well as metastatic disease to the liver and a suspicious mass along the small bowel mesentery.  He was recently referred to gastroenterology to further evaluate this lesion. No masses were appreciated on colonoscopy/endoscopy.  Dr. Julien Nordmann had a lengthly discussion with the patient today about his current condition and treatment options. Dr. Julien Nordmann recommends changing treatment to systemic chemotherapy with carboplatin for an AUC of 5, Alimta 500 mg/m, and Keytruda 200 mg IV every 3 weeks.  The patient is interested in proceeding with systemic chemotherapy.  He is expected to start his first dose of this treatment on 12/13/2019.  We discussed the adverse side effects of treatment including but not limited to alopecia, myelosuppression, nausea and vomiting, peripheral neuropathy, liver or renal dysfunction as well as immunotherapy mediated adverse effects.   We will arrange for the patient to have a B12 injection while in the clinic today.     I sent prescriptions for 1 mg folic acid p.o. daily as well as oxycodone 5 mg every 6 hours PRN to his pharmacy. Discussed that the oxycodone is to be used as needed for significant pain.   The patient will follow-up in 2 weeks for a one-week follow-up visit after completing his first cycle of chemotherapy.  The  patient will continue to follow with endocrinology due to his  history of a total thyroidectomy.  The patient was advised to call immediately if he has any concerning symptoms in the interval. The patient voices understanding of current disease status and treatment options and is in agreement with the current care plan. All questions were answered. The patient knows to call the clinic with any problems, questions or concerns. We can certainly see the patient much sooner if necessary  No orders of the defined types were placed in this encounter.    Carl Monical L Adaira Centola, PA-C 12/03/19  ADDENDUM: Hematology/Oncology Attending: I had a face-to-face encounter with the patient today.  I recommended his care plan.  This is a very pleasant 74 years old white male with multiple malignancy including history of prostate cancer, thyroid cancer as well as stage IIIa non-small cell lung cancer, adenocarcinoma diagnosed in September 2020 with no actionable mutations.  The patient was treated with a course of concurrent chemoradiation with weekly carboplatin and paclitaxel with partial response.  He started treatment with consolidation Imfinzi status 6 cycles. Unfortunately staging work-up including CT scan as well as a PET scan showed evidence for disease recurrence in the right suprahilar region as well as 2 hypermetabolic left eccentric mesenteric masses along the small bowel mesentery with 3 new hypermetabolic lesions in the liver compatible with malignancy. The patient was referred to his gastroenterologist and he underwent upper endoscopy as well as colonoscopy that showed few polyps but no significant malignancy. I had a lengthy discussion with the patient today about his current condition and treatment options.  I recommended for the patient to have repeat ultrasound guided core biopsy of one of the liver lesion for confirmation of the tissue diagnosis and to rule out the development of any other malignancy rather than the primary adenocarcinoma of the lung. In  the meantime I discussed with the patient his treatment options based on the high likelihood of progressive adenocarcinoma of the lung.  I gave him the option of palliative care versus palliative systemic chemotherapy with carboplatin for AUC of 5, Alimta 500 mg/M2 and Keytruda 200 mg IV every 3 weeks. The patient is interested in proceeding with systemic therapy and he is expected to start the first dose of this treatment next week. I discussed with the patient the adverse effect of this treatment including but not limited to alopecia, myelosuppression, nausea and vomiting, peripheral neuropathy, liver or renal dysfunction as well as immunotherapy adverse effects. The patient will receive vitamin B12 injection today and we will send a prescription for folic acid and Compazine to his pharmacy. He will come back for follow-up visit in 1 week for evaluation. He was advised to call immediately if he has any other concerning symptoms in the interval.

## 2019-12-03 ENCOUNTER — Inpatient Hospital Stay: Payer: Medicare Other

## 2019-12-03 ENCOUNTER — Inpatient Hospital Stay: Payer: Medicare Other | Admitting: Physician Assistant

## 2019-12-03 ENCOUNTER — Encounter: Payer: Self-pay | Admitting: Physician Assistant

## 2019-12-03 ENCOUNTER — Other Ambulatory Visit: Payer: Self-pay | Admitting: Internal Medicine

## 2019-12-03 ENCOUNTER — Other Ambulatory Visit: Payer: Medicare Other

## 2019-12-03 ENCOUNTER — Ambulatory Visit: Payer: Medicare Other | Admitting: Internal Medicine

## 2019-12-03 ENCOUNTER — Other Ambulatory Visit: Payer: Self-pay

## 2019-12-03 ENCOUNTER — Encounter (HOSPITAL_COMMUNITY): Payer: Self-pay | Admitting: Radiology

## 2019-12-03 VITALS — BP 102/65 | HR 81 | Temp 97.8°F | Resp 18 | Wt 196.0 lb

## 2019-12-03 DIAGNOSIS — Z5111 Encounter for antineoplastic chemotherapy: Secondary | ICD-10-CM | POA: Diagnosis not present

## 2019-12-03 DIAGNOSIS — C61 Malignant neoplasm of prostate: Secondary | ICD-10-CM | POA: Diagnosis not present

## 2019-12-03 DIAGNOSIS — C3491 Malignant neoplasm of unspecified part of right bronchus or lung: Secondary | ICD-10-CM | POA: Diagnosis not present

## 2019-12-03 DIAGNOSIS — C3411 Malignant neoplasm of upper lobe, right bronchus or lung: Secondary | ICD-10-CM | POA: Diagnosis not present

## 2019-12-03 DIAGNOSIS — D6481 Anemia due to antineoplastic chemotherapy: Secondary | ICD-10-CM | POA: Diagnosis not present

## 2019-12-03 DIAGNOSIS — Z7189 Other specified counseling: Secondary | ICD-10-CM

## 2019-12-03 DIAGNOSIS — Z923 Personal history of irradiation: Secondary | ICD-10-CM | POA: Diagnosis not present

## 2019-12-03 DIAGNOSIS — G893 Neoplasm related pain (acute) (chronic): Secondary | ICD-10-CM

## 2019-12-03 DIAGNOSIS — I2510 Atherosclerotic heart disease of native coronary artery without angina pectoris: Secondary | ICD-10-CM | POA: Diagnosis not present

## 2019-12-03 DIAGNOSIS — Z79899 Other long term (current) drug therapy: Secondary | ICD-10-CM | POA: Diagnosis not present

## 2019-12-03 DIAGNOSIS — Z85850 Personal history of malignant neoplasm of thyroid: Secondary | ICD-10-CM | POA: Diagnosis not present

## 2019-12-03 DIAGNOSIS — C787 Secondary malignant neoplasm of liver and intrahepatic bile duct: Secondary | ICD-10-CM | POA: Diagnosis not present

## 2019-12-03 DIAGNOSIS — K219 Gastro-esophageal reflux disease without esophagitis: Secondary | ICD-10-CM | POA: Diagnosis not present

## 2019-12-03 DIAGNOSIS — E785 Hyperlipidemia, unspecified: Secondary | ICD-10-CM | POA: Diagnosis not present

## 2019-12-03 DIAGNOSIS — Z5112 Encounter for antineoplastic immunotherapy: Secondary | ICD-10-CM | POA: Diagnosis not present

## 2019-12-03 DIAGNOSIS — I10 Essential (primary) hypertension: Secondary | ICD-10-CM | POA: Diagnosis not present

## 2019-12-03 DIAGNOSIS — C786 Secondary malignant neoplasm of retroperitoneum and peritoneum: Secondary | ICD-10-CM | POA: Diagnosis not present

## 2019-12-03 LAB — CMP (CANCER CENTER ONLY)
ALT: 30 U/L (ref 0–44)
AST: 32 U/L (ref 15–41)
Albumin: 3.7 g/dL (ref 3.5–5.0)
Alkaline Phosphatase: 111 U/L (ref 38–126)
Anion gap: 10 (ref 5–15)
BUN: 12 mg/dL (ref 8–23)
CO2: 23 mmol/L (ref 22–32)
Calcium: 9.2 mg/dL (ref 8.9–10.3)
Chloride: 104 mmol/L (ref 98–111)
Creatinine: 1.31 mg/dL — ABNORMAL HIGH (ref 0.61–1.24)
GFR, Est AFR Am: >60 mL/min (ref >60)
GFR, Estimated: 53 mL/min — ABNORMAL LOW (ref >60)
Glucose, Bld: 107 mg/dL — ABNORMAL HIGH (ref 70–99)
Potassium: 4.4 mmol/L (ref 3.5–5.1)
Sodium: 137 mmol/L (ref 135–145)
Total Bilirubin: 0.6 mg/dL (ref 0.3–1.2)
Total Protein: 7.2 g/dL (ref 6.5–8.1)

## 2019-12-03 LAB — CBC WITH DIFFERENTIAL (CANCER CENTER ONLY)
Abs Immature Granulocytes: 0.02 10*3/uL (ref 0.00–0.07)
Basophils Absolute: 0 10*3/uL (ref 0.0–0.1)
Basophils Relative: 0 %
Eosinophils Absolute: 0.1 10*3/uL (ref 0.0–0.5)
Eosinophils Relative: 2 %
HCT: 35.9 % — ABNORMAL LOW (ref 39.0–52.0)
Hemoglobin: 11.3 g/dL — ABNORMAL LOW (ref 13.0–17.0)
Immature Granulocytes: 0 %
Lymphocytes Relative: 13 %
Lymphs Abs: 1 10*3/uL (ref 0.7–4.0)
MCH: 27.2 pg (ref 26.0–34.0)
MCHC: 31.5 g/dL (ref 30.0–36.0)
MCV: 86.5 fL (ref 80.0–100.0)
Monocytes Absolute: 0.8 10*3/uL (ref 0.1–1.0)
Monocytes Relative: 10 %
Neutro Abs: 5.5 10*3/uL (ref 1.7–7.7)
Neutrophils Relative %: 75 %
Platelet Count: 324 10*3/uL (ref 150–400)
RBC: 4.15 MIL/uL — ABNORMAL LOW (ref 4.22–5.81)
RDW: 14.2 % (ref 11.5–15.5)
WBC Count: 7.4 10*3/uL (ref 4.0–10.5)
nRBC: 0 % (ref 0.0–0.2)

## 2019-12-03 LAB — CEA (IN HOUSE-CHCC): CEA (CHCC-In House): 176.6 ng/mL — ABNORMAL HIGH (ref 0.00–5.00)

## 2019-12-03 MED ORDER — CYANOCOBALAMIN 1000 MCG/ML IJ SOLN
1000.0000 ug | Freq: Once | INTRAMUSCULAR | Status: AC
  Administered 2019-12-03: 1000 ug via INTRAMUSCULAR

## 2019-12-03 MED ORDER — CYANOCOBALAMIN 1000 MCG/ML IJ SOLN
INTRAMUSCULAR | Status: AC
  Filled 2019-12-03: qty 1

## 2019-12-03 MED ORDER — OXYCODONE HCL 5 MG PO TABS: 5.0000 mg | ORAL_TABLET | Freq: Four times a day (QID) | ORAL | 0 refills | Status: DC | PRN

## 2019-12-03 MED ORDER — FOLIC ACID 1 MG PO TABS: 1.0000 mg | ORAL_TABLET | Freq: Every day | ORAL | 2 refills | Status: AC

## 2019-12-03 NOTE — Progress Notes (Signed)
Elliot Dally Degrazia Male, 74 y.o., 13-Oct-1945 MRN:  [id number] Phone:  [phone] Jerilynn Mages) PCP:  [name], MD Coverage:  [facility] ([facility]) 12/11/2019 at 1:00 PM  RE: [place] Core Biopsy (Liver) Received: Today Markus Daft, MD  Arlyn Leak for US guided core biopsy of right inferior hepatic lesion on PET CT, se. 605, image 119.     [place]       Previous Messages   ----- Message -----  From: Garth Bigness D  Sent: 12/03/2019  3:45 PM EDT  To: Ir Procedure Requests  Subject: [place] Core Biopsy (Liver)              Procedure:  [place] CORE BIOPSY (LIVER)   Reason:  Adenocarcinoma of right lung, stage 3, Biopsy of one of the liver lesions seen on recent PET scan. To confirm that this is the same cancer as his prior lung cancer, adenocarcinoma   History: CT, NM PET in computer   Provider: Gracelyn Nurse   Provider Contact: [phone]

## 2019-12-03 NOTE — Patient Instructions (Signed)
Summary:  -There are two main categories of lung cancer, they are named based on the size of the cancer cell. One is called Non-Small cell lung cancer. The other type is Small Cell Lung Cancer -The sample (biopsy) that they took of your tumor was consistent with a subtype of Non-small cell lung cancer called Adenocarcinoma. This is the most common type of lung cancer.  -We covered a lot of important information at your appointment today regarding what the treatment plan is moving forward. Here are the the main points that were discussed at your office visit with [place] today:  -The treatment that you will receive consists of two chemotherapy drugs, called Carboplatin and Alimta (also called Pemetrexed) and one immunotherapy drug called Keytruda (pembrolizumab).  -We are planning on starting your treatment next week on 12/11/19.  -Your treatment will be given once every 3 weeks. We will check your labs once a week for the first ~5 treatments just to make sure that important components of your blood are in an acceptable range -We will get a CT scan after 3 treatments to check on the progress of treatment  Medications:  -I have sent a few important medication prescriptions to your pharmacy.  -Compazine was sent to your pharmacy. This medication is for nausea. You may take this every 6 hours as needed if you feel nauseous.  -I have also sent a prescription for 1 mg of folic acid to your pharmacy. We need you to take 1 tablet every day.  -We will administer vitamin B12 every 9 weeks while you are here in the clinic. You have received your first dose today.   Referrals or Imaging: -I placed an order for interventional radiology to do a liver biopsy. They should be calling you in the next few days to arrange for that.   Follow up:  -We will see you back for a follow up visit 1 week after your first treatment to see how it went and help manage any side effects of treatment that you may have   -If you need to  reach [place] at any time, the main office number to the cancer center is [phone], when you call, ask to speak to either Cassie's or Dr. Worthy Flank nurse.

## 2019-12-03 NOTE — Progress Notes (Signed)
DISCONTINUE ON PATHWAY REGIMEN - Non-Small Cell Lung     A cycle is every 14 days:     Durvalumab   **Always confirm dose/schedule in your pharmacy ordering system**  REASON: Disease Progression PRIOR TREATMENT: [id number]: Durvalumab 10 mg/kg q14 Days x up to 12 Months TREATMENT RESPONSE: Progressive Disease (PD)  START ON PATHWAY REGIMEN - Non-Small Cell Lung     A cycle is every 21 days:     Pembrolizumab      Pemetrexed      Carboplatin   **Always confirm dose/schedule in your pharmacy ordering system**  Patient Characteristics: Stage IV Metastatic, Nonsquamous, Initial Chemotherapy/Immunotherapy, PS = 0, 1, ALK Rearrangement Negative and ROS1 Rearrangement Negative and NTRK Gene Fusion?Negative and RET Gene Fusion?Negative and EGFR Mutation Negative/Non?Sensitizing, PD-L1  Expression Positive 1-49% (TPS) / Negative / Not Tested / Awaiting Test Results and Immunotherapy Candidate Therapeutic Status: Stage IV Metastatic Histology: Nonsquamous Cell ROS1 Rearrangement Status: Negative Other Mutations/Biomarkers: No Other Actionable Mutations NTRK Gene Fusion Status: Negative PD-L1 Expression Status: Quantity Not Sufficient Chemotherapy/Immunotherapy LOT: Initial Chemotherapy/Immunotherapy Molecular Targeted Therapy: Not Appropriate MET Exon 14 Mutation Status: Negative RET Gene Fusion Status: Negative ALK Rearrangement Status: Negative EGFR Mutation Status: Negative/Wild Type BRAF V600E Mutation Status: Negative ECOG Performance Status: 1 Biomarker Assessment Status Confirmation: All Genomic Markers Negative or Only MET+ or BRAF+ Immunotherapy Candidate Status: Candidate for Immunotherapy Intent of Therapy: Non-Curative / Palliative Intent, Discussed with Patient

## 2019-12-04 ENCOUNTER — Encounter: Payer: Self-pay | Admitting: Family Medicine

## 2019-12-06 ENCOUNTER — Ambulatory Visit: Payer: Medicare Other | Admitting: Internal Medicine

## 2019-12-06 ENCOUNTER — Other Ambulatory Visit: Payer: Medicare Other

## 2019-12-06 ENCOUNTER — Ambulatory Visit: Payer: Medicare Other

## 2019-12-07 NOTE — Progress Notes (Signed)
Pharmacist Chemotherapy Monitoring - Initial Assessment    Anticipated start date: 12/13/19  Regimen:  . Are orders appropriate based on the patient's diagnosis, regimen, and cycle? Yes . Does the plan date match the patient's scheduled date? Yes . Is the sequencing of drugs appropriate? Yes . Are the premedications appropriate for the patient's regimen? Yes . Prior Authorization for treatment is: Approved o If applicable, is the correct biosimilar selected based on the patient's insurance? not applicable  Organ Function and Labs: Marland Kitchen Are dose adjustments needed based on the patient's renal function, hepatic function, or hematologic function? Yes . Are appropriate labs ordered prior to the start of patient's treatment? Yes . Other organ system assessment, if indicated: N/A . The following baseline labs, if indicated, have been ordered: pembrolizumab: baseline TSH +/- T4  Dose Assessment: . Are the drug doses appropriate? Yes . Are the following correct: o Drug concentrations Yes o IV fluid compatible with drug Yes o Administration routes Yes o Timing of therapy Yes . If applicable, does the patient have documented access for treatment and/or plans for port-a-cath placement? yes . If applicable, have lifetime cumulative doses been properly documented and assessed? yes Lifetime Dose Tracking  . Carboplatin: 1,200 mg = 0.01 % of the maximum lifetime dose of 999,999,999 mg  o Pt has had Carbo AUC = 2 x 7 doses  Toxicity Monitoring/Prevention: . The patient has the following take home antiemetics prescribed: N/A . The patient has the following take home medications prescribed: folic acid for pemetrexed . Medication allergies and previous infusion related reactions, if applicable, have been reviewed and addressed. Yes . The patient's current medication list has been assessed for drug-drug interactions with their chemotherapy regimen. no significant drug-drug interactions were identified on  review.  Order Review: . Are the treatment plan orders signed? Yes . Is the patient scheduled to see a provider prior to their treatment? No  I verify that I have reviewed each item in the above checklist and answered each question accordingly.   Kennith Center, Pharm.D., CPP 12/07/2019@1 :32 PM

## 2019-12-09 ENCOUNTER — Other Ambulatory Visit: Payer: Self-pay | Admitting: Cardiology

## 2019-12-10 ENCOUNTER — Other Ambulatory Visit: Payer: Self-pay | Admitting: Student

## 2019-12-10 ENCOUNTER — Telehealth: Payer: Self-pay | Admitting: Physician Assistant

## 2019-12-10 NOTE — Telephone Encounter (Signed)
Scheduled per 07/12 los, patient has been called and voicemail was left.

## 2019-12-11 ENCOUNTER — Other Ambulatory Visit: Payer: Self-pay

## 2019-12-11 ENCOUNTER — Telehealth: Payer: Self-pay | Admitting: Medical Oncology

## 2019-12-11 ENCOUNTER — Encounter: Payer: Self-pay | Admitting: Internal Medicine

## 2019-12-11 ENCOUNTER — Ambulatory Visit (INDEPENDENT_AMBULATORY_CARE_PROVIDER_SITE_OTHER): Payer: Medicare Other | Admitting: Internal Medicine

## 2019-12-11 ENCOUNTER — Ambulatory Visit (HOSPITAL_COMMUNITY)
Admission: RE | Admit: 2019-12-11 | Discharge: 2019-12-11 | Disposition: A | Discharge status: Home / Self Care | Payer: Medicare Other | Source: Ambulatory Visit | Attending: Physician Assistant | Admitting: Physician Assistant

## 2019-12-11 VITALS — BP 120/60 | HR 95 | Ht 67.0 in | Wt 190.0 lb

## 2019-12-11 DIAGNOSIS — Z87891 Personal history of nicotine dependence: Secondary | ICD-10-CM | POA: Diagnosis not present

## 2019-12-11 DIAGNOSIS — C3491 Malignant neoplasm of unspecified part of right bronchus or lung: Secondary | ICD-10-CM | POA: Insufficient documentation

## 2019-12-11 DIAGNOSIS — E785 Hyperlipidemia, unspecified: Secondary | ICD-10-CM | POA: Diagnosis not present

## 2019-12-11 DIAGNOSIS — K219 Gastro-esophageal reflux disease without esophagitis: Secondary | ICD-10-CM | POA: Insufficient documentation

## 2019-12-11 DIAGNOSIS — K769 Liver disease, unspecified: Secondary | ICD-10-CM | POA: Insufficient documentation

## 2019-12-11 DIAGNOSIS — C73 Malignant neoplasm of thyroid gland: Secondary | ICD-10-CM

## 2019-12-11 DIAGNOSIS — K7689 Other specified diseases of liver: Secondary | ICD-10-CM | POA: Diagnosis not present

## 2019-12-11 DIAGNOSIS — C229 Malignant neoplasm of liver, not specified as primary or secondary: Secondary | ICD-10-CM | POA: Diagnosis not present

## 2019-12-11 DIAGNOSIS — Z7901 Long term (current) use of anticoagulants: Secondary | ICD-10-CM | POA: Insufficient documentation

## 2019-12-11 DIAGNOSIS — E890 Postprocedural hypothyroidism: Secondary | ICD-10-CM | POA: Diagnosis not present

## 2019-12-11 DIAGNOSIS — Z8546 Personal history of malignant neoplasm of prostate: Secondary | ICD-10-CM | POA: Insufficient documentation

## 2019-12-11 DIAGNOSIS — C787 Secondary malignant neoplasm of liver and intrahepatic bile duct: Secondary | ICD-10-CM | POA: Insufficient documentation

## 2019-12-11 DIAGNOSIS — R7303 Prediabetes: Secondary | ICD-10-CM | POA: Diagnosis not present

## 2019-12-11 DIAGNOSIS — Z85118 Personal history of other malignant neoplasm of bronchus and lung: Secondary | ICD-10-CM | POA: Diagnosis not present

## 2019-12-11 DIAGNOSIS — Z79899 Other long term (current) drug therapy: Secondary | ICD-10-CM | POA: Insufficient documentation

## 2019-12-11 DIAGNOSIS — I10 Essential (primary) hypertension: Secondary | ICD-10-CM | POA: Insufficient documentation

## 2019-12-11 LAB — TSH: TSH: 7.06 u[IU]/mL — ABNORMAL HIGH (ref 0.35–4.50)

## 2019-12-11 LAB — CBC
HCT: 35.4 % — ABNORMAL LOW (ref 39.0–52.0)
Hemoglobin: 10.9 g/dL — ABNORMAL LOW (ref 13.0–17.0)
MCH: 26.7 pg (ref 26.0–34.0)
MCHC: 30.8 g/dL (ref 30.0–36.0)
MCV: 86.6 fL (ref 80.0–100.0)
Platelets: 430 10*3/uL — ABNORMAL HIGH (ref 150–400)
RBC: 4.09 MIL/uL — ABNORMAL LOW (ref 4.22–5.81)
RDW: 14.5 % (ref 11.5–15.5)
WBC: 8.5 10*3/uL (ref 4.0–10.5)
nRBC: 0 % (ref 0.0–0.2)

## 2019-12-11 LAB — PROTIME-INR
INR: 1.1 (ref 0.8–1.2)
Prothrombin Time: 13.6 seconds (ref 11.4–15.2)

## 2019-12-11 LAB — T4, FREE: Free T4: 1.08 ng/dL (ref 0.60–1.60)

## 2019-12-11 MED ORDER — MIDAZOLAM HCL 2 MG/2ML IJ SOLN
INTRAMUSCULAR | Status: AC | PRN
  Administered 2019-12-11: 1 mg via INTRAVENOUS

## 2019-12-11 MED ORDER — MIDAZOLAM HCL 2 MG/2ML IJ SOLN
INTRAMUSCULAR | Status: AC
  Filled 2019-12-11: qty 2

## 2019-12-11 MED ORDER — SODIUM CHLORIDE 0.9 % IV SOLN: INTRAVENOUS | Status: DC

## 2019-12-11 MED ORDER — FENTANYL CITRATE (PF) 100 MCG/2ML IJ SOLN
INTRAMUSCULAR | Status: AC | PRN
  Administered 2019-12-11: 50 ug via INTRAVENOUS

## 2019-12-11 MED ORDER — LEVOTHYROXINE SODIUM 125 MCG PO TABS: 125.0000 ug | ORAL_TABLET | Freq: Every day | ORAL | 3 refills | Status: DC

## 2019-12-11 MED ORDER — LIDOCAINE HCL (PF) 1 % IJ SOLN
INTRAMUSCULAR | Status: AC
  Filled 2019-12-11: qty 30

## 2019-12-11 MED ORDER — FENTANYL CITRATE (PF) 100 MCG/2ML IJ SOLN
INTRAMUSCULAR | Status: AC
  Filled 2019-12-11: qty 2

## 2019-12-11 MED ORDER — HYDROCODONE-ACETAMINOPHEN 5-325 MG PO TABS: 1.0000 | ORAL_TABLET | ORAL | Status: DC | PRN

## 2019-12-11 NOTE — Consult Note (Signed)
Chief Complaint: Hypermetabolic liver lesion. Request is for liver biopsy  Referring Physician(s): Dr. Loletha Grayer Heilingoetter  Supervising Physician: Arne Cleveland  Patient Status: [facility] - Out[facility]pt  History of Present Illness: Carl Harrell is a 74 y.o. male History of HTN, HLD, metastatic non small cell lung cancer likely adenocarcinoma, prostate adenocarcinoma and follicular neoplasm of left thyroid. PET from 6.17.21 shows hypermetabolic right hepatic lobe. Team is requesting liver biopsy for further evaluation of metastases versus new primary. .    Past Medical History:  Diagnosis Date  . ALLERGIC RHINITIS 01/30/2007  . Arthritis    "hands" (05/29/2014)  . Cancer ([place])    lung, prostate  . Constipation   . Coronary artery disease    LHC (05/29/14): pLAD 50-70, pRCA 99 (L>R collats), EF 55% >> PCI: 3.5 x 28 mm Xience Alpine DES to [employer]  . Cough secondary to angiotensin converting enzyme inhibitor (ACE-I) 05/28/2013  . ED (erectile dysfunction)   . GERD 09/26/2007  . HYPERLIPIDEMIA 09/26/2007  . Hypertension   . Lung cancer ([place]) dx'd 03/2018  . PLANTAR FASCIITIS, BILATERAL 07/08/2009  . Pre-diabetes   . Prostate CA ([place]) dx'd 2017 or 2018   xrt  . Unspecified hearing loss 06/16/2009   wears aides both ears    Past Surgical History:  Procedure Laterality Date  . CARDIAC CATHETERIZATION  05/29/2014   Procedure: CORONARY STENT INTERVENTION;  Surgeon: Sinclair Grooms, MD;  Location: [facility] CATH LAB;  Service: Cardiovascular;;  Prox RCA  . COLONOSCOPY    . CORONARY ANGIOPLASTY WITH STENT PLACEMENT  05/29/2014   "1"  . KNEE ARTHROSCOPY Right ~ 2005  . LEFT HEART CATHETERIZATION WITH CORONARY ANGIOGRAM N/A 05/29/2014   Procedure: LEFT HEART CATHETERIZATION WITH CORONARY ANGIOGRAM;  Surgeon: Sinclair Grooms, MD;  Location: [facility] CATH LAB;  Service: Cardiovascular;  Laterality: N/A;  . THYROIDECTOMY N/A 06/25/2019   Procedure: TOTAL THYROIDECTOMY;  Surgeon: Armandina Gemma, MD;  Location: [facility] ORS;   Service: General;  Laterality: N/A;  . VIDEO BRONCHOSCOPY WITH ENDOBRONCHIAL ULTRASOUND N/A 02/20/2019   Procedure: VIDEO BRONCHOSCOPY WITH ENDOBRONCHIAL ULTRASOUND;  Surgeon: Garner Nash, DO;  Location: [facility] OR;  Service: Thoracic;  Laterality: N/A;    Allergies: Lisinopril  Medications: Prior to Admission medications   Medication Sig Start Date End Date Taking? Authorizing Provider  levothyroxine (SYNTHROID) 100 MCG tablet Take 1 tablet (100 mcg total) by mouth daily. 10/25/19 10/24/20 Yes Philemon Kingdom, MD  calcium carbonate (TUMS - DOSED IN MG ELEMENTAL CALCIUM) 500 MG chewable tablet Chew 1 tablet by mouth daily.    [provider]  cetirizine (ZYRTEC) 10 MG tablet Take 1 tablet (10 mg total) by mouth daily. Patient not taking: Reported on 10/25/2019 01/30/19   [name], MD  ezetimibe (ZETIA) 10 MG tablet Take 1 tablet (10 mg total) by mouth daily. 11/27/19   Sueanne Margarita, MD  fluticasone (FLONASE) 50 MCG/ACT nasal spray SHAKE LIQUID AND USE 1 SPRAY IN EACH NOSTRIL DAILY Patient taking differently: Place 1 spray into both nostrils daily.  01/31/19   [name], MD  folic acid (FOLVITE) 1 MG tablet Take 1 tablet (1 mg total) by mouth daily. 12/03/19   Heilingoetter, Cassandra L, PA-C  losartan (COZAAR) 50 MG tablet TAKE 1 TABLET BY MOUTH DAILY 12/11/19   Sueanne Margarita, MD  metoprolol tartrate (LOPRESSOR) 25 MG tablet TAKE 1 TABLET BY MOUTH TWICE DAILY 10/25/19   [name], MD  Multiple Vitamin (MULTIVITAMIN WITH MINERALS)  TABS tablet Take 1 tablet by mouth daily.    [provider]  omeprazole (PRILOSEC) 40 MG capsule Take 1 capsule (40 mg total) by mouth 2 (two) times daily. 11/05/19   Tanner, Lyndon Code., PA-C  oxyCODONE (ROXICODONE) 5 MG immediate release tablet Take 1 tablet (5 mg total) by mouth every 6 (six) hours as needed for severe pain. 12/03/19   Heilingoetter, Cassandra L, PA-C  simvastatin (ZOCOR) 40 MG tablet TAKE 1 TABLET(40 MG) BY MOUTH DAILY AT  6 PM Patient taking differently: Take 40 mg by mouth daily at 6 PM.  01/30/19   [name], MD  STOOL SOFTENER 100 MG capsule Take 100 mg by mouth daily as needed. 11/20/19   [provider]  vitamin B-12 (CYANOCOBALAMIN) 50 MCG tablet Take 50 mcg by mouth daily.    [provider]     Family History  Problem Relation Age of Onset  . Heart attack Mother   . Heart disease Mother   . Heart attack Father   . Heart disease Father   . COPD Neg Hx        family hx  . Hyperlipidemia Neg Hx        family hx    Social History   Socioeconomic History  . Marital status: Married    Spouse name: Not on file  . Number of children: Not on file  . Years of education: Not on file  . Highest education level: Not on file  Occupational History  . Not on file  Tobacco Use  . Smoking status: Former Smoker    Packs/day: 0.20    Years: 15.00    Pack years: 3.00    Types: Cigarettes    Quit date: 05/24/1978    Years since quitting: 41.5  . Smokeless tobacco: Never Used  . Tobacco comment: 05/29/2014 "smoked socially; nothing since 1980"  Vaping Use  . Vaping Use: Never used  Substance and Sexual Activity  . Alcohol use: Yes    Alcohol/week: 2.0 standard drinks    Types: 2 Shots of liquor per week    Comment: beer or two a week  . Drug use: No  . Sexual activity: Not Currently  Other Topics Concern  . Not on file  Social History Narrative   Lives with wife in a one story home.  Has 2 children.  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  Education: 3 years of college.    Does wood working.    Social Determinants of Health   Financial Resource Strain:   . Difficulty of Paying Living Expenses:   Food Insecurity:   . Worried About Charity fundraiser in the Last Year:   . Arboriculturist in the Last Year:   Transportation Needs:   . Film/video editor (Medical):   Marland Kitchen Lack of Transportation (Non-Medical):   Physical Activity:   . Days of Exercise per Week:   . Minutes of Exercise  per Session:   Stress:   . Feeling of Stress :   Social Connections:   . Frequency of Communication with Friends and Family:   . Frequency of Social Gatherings with Friends and Family:   . Attends Religious Services:   . Active Member of Clubs or Organizations:   . Attends Archivist Meetings:   Marland Kitchen Marital Status:     Review of Systems: A 12 point ROS discussed and pertinent positives are indicated in the HPI above.  All other systems are negative.  Review of Systems  Constitutional: Negative for fever.  HENT: Negative for congestion.   Respiratory: Negative for cough and shortness of breath.   Cardiovascular: Negative for chest pain.  Gastrointestinal: Negative for abdominal pain.  Musculoskeletal: Positive for back pain (lower right with radation down. ).  Neurological: Negative for headaches.  Psychiatric/Behavioral: Negative for behavioral problems and confusion.    Vital Signs: BP 121/81   Pulse 87   Temp 98.5 F (36.9 C) (Oral)   Resp 16   Ht 5\' 7"  (1.702 m)   Wt 190 lb (86.2 kg)   SpO2 99%   BMI 29.76 kg/m   Physical Exam Vitals and nursing note reviewed.  Constitutional:      Appearance: He is well-developed.  HENT:     Head: Normocephalic.  Cardiovascular:     Rate and Rhythm: Normal rate.     Heart sounds: Normal heart sounds.  Pulmonary:     Effort: Pulmonary effort is normal.     Breath sounds: Normal breath sounds.  Musculoskeletal:        General: Normal range of motion.     Cervical back: Normal range of motion.  Skin:    General: Skin is dry.  Neurological:     Mental Status: He is alert and oriented to person, place, and time.     Imaging: No results found.  Labs:  CBC: Recent Labs    10/25/19 0825 11/13/19 0843 12/03/19 1359 12/11/19 1214  WBC 6.6 8.0 7.4 8.5  HGB 13.4 12.1* 11.3* 10.9*  HCT 41.8 37.9* 35.9* 35.4*  PLT 252 287 324 430*    COAGS: No results for input(s): INR, APTT in the last 8760  hours.  BMP: Recent Labs    10/11/19 1100 10/25/19 0825 11/13/19 0843 12/03/19 1359  NA 138 141 139 137  K 4.2 4.5 4.6 4.4  CL 106 108 106 104  CO2 24 21* 23 23  GLUCOSE 158* 135* 141* 107*  BUN 13 10 10 12   CALCIUM 8.9 8.9 9.1 9.2  CREATININE 1.09 1.16 1.26* 1.31*  GFRNONAA >60 >60 55* 53*  GFRAA >60 >60 >60 >60    LIVER FUNCTION TESTS: Recent Labs    10/11/19 1100 10/25/19 0825 11/13/19 0843 12/03/19 1359  BILITOT 0.6 0.6 0.6 0.6  AST 15 15 17  32  ALT 18 18 20 30   ALKPHOS 65 69 75 111  PROT 6.9 6.8 7.2 7.2  ALBUMIN 3.6 3.6 3.8 3.7   Assessment and Plan: 74 y.o, male outpatient. History of HTN, HLD, metastatic non small cell lung cancer likely adenocarcinoma, prostate adenocarcinoma and follicular neoplasm of left thyroid. PET from 6.17.21 shows hypermetabolic right hepatic lobe. Team is requesting liver biopsy for further evaluation of metastases versus new primary. .  All labs and medications are within acceptable parameters.  Patient is afebrile.  Risks and benefits of liver was discussed with the patient and/or patient's family including, but not limited to bleeding, infection, damage to adjacent structures or low yield requiring additional tests.  All of the questions were answered and there is agreement to proceed.  Consent signed and in chart.    Thank you for this interesting consult.  I greatly enjoyed meeting Carl Harrell and look forward to participating in their care.  A copy of this report was sent to the requesting provider on this date.  Electronically Signed: Jacqualine Mau, NP 12/11/2019, 12:33 PM   I spent a total of  40 Minutes   in face to  face in clinical consultation, greater than 50% of which was counseling/coordinating care for liver biopsy

## 2019-12-11 NOTE — Procedures (Signed)
°  Procedure: US guided core bx liver lesion   EBL:   minimal Complications:  none immediate  See full dictation in [facility].  Dillard Cannon MD Main # [phone] Pager  [phone]

## 2019-12-11 NOTE — Patient Instructions (Signed)
Please stop at the lab.  Please continue Levothyroxine 100 mcg daily.  Take the thyroid hormone every day, with water, at least 30 minutes before breakfast, separated by at least 4 hours from: - acid reflux medications - calcium - iron - multivitamins  Please come back for a follow-up appointment in 6 months.

## 2019-12-11 NOTE — Progress Notes (Signed)
Patient ID: Carl Harrell, male   DOB: December 21, 1945, 74 y.o.   MRN: [id number]   This visit occurred during the SARS-CoV-2 public health emergency.  Safety protocols were in place, including screening questions prior to the visit, additional usage of staff PPE, and extensive cleaning of exam room while observing appropriate contact time as indicated for disinfecting solutions.   HPI  Carl Harrell is a 74 y.o.-year-old male, referred by Dr. Harlow Asa, for management of thyroid cancer and postsurgical hypothyroidism.  Last visit 4 months ago.  Follicular variant of papillary thyroid cancer  - dx 03/2019 during investigation for his stage III lung cancer (s/p RxTx and ChTx).   Reviewed his thyroid cancer history: PET/CT (02/23/2019): Increased uptake in the left inferior thyroid Thyroid U/S (03/16/2019): 3 x 2.3 x 2.4 cm isoechoic thyroid nodule with extrathyroidal extension FNA of the nodule (04/05/2019): Follicular neoplasm Total thyroidectomy (06/25/2019): Left mid and low lobe 3.2 cm carcinoma, follicular variant of PTC with capsular invasion, clear margins, and without lymphovascular vascular extension.  No lymph nodes analyzed. A. THYROID, TOTAL THYROIDECTOMY:  - Papillary thyroid carcinoma, follicular variant, 3.2 cm, with capsular invasion  - Resection margins are negative for carcinoma  - No evidence of extrathyroidal extension  - No evidence of lymphovascular invasion  - Scattered nonnecrotizing granulomas in addition thyroid parenchyma   Nonnecrotizing, well-formed granulomas are most suggestive of  sarcoidosis. Clinical correlation is suggested. Dr. Harlow Asa was paged  on 06/28/2019.   Procedure: Total thyroidectomy  Tumor Focality: Unifocal  Tumor Site: Left mid and lower lobe  Tumor Size: 3.2 cm  Histologic Type: Follicular variant of papillary thyroid carcinoma with capsular invasion  Margins: Uninvolved by tumor  Angioinvasion: Not identified  Lymphatic Invasion: Not identified   Extrathyroidal extension: Not identified  Regional Lymph Nodes: No lymph nodes submitted or found  Pathologic Stage Classification (pTNM, AJCC 8th Edition): pT2, pNx  Comment(s): Nonnecrotizing granulomas   Pt denies: - feeling nodules in neck - hoarseness - dysphagia - choking - SOB with lying down  Postsurgical hypothyroidism:  Pt is on levothyroxine 100 mcg daily, taken: - in am (4 am) - fasting - Separated by 4 hours from b'fast - no Fe - + MVIs at 10 AM, calcium later in the day, + Omeprazole >4h later - not on Biotin  Reviewed patient's TFTs: Lab Results  Component Value Date   TSH 5.895 (H) 11/13/2019   TSH 2.649 10/11/2019   TSH 1.662 09/13/2019   TSH 4.774 (H) 08/13/2019   TSH 1.921 07/18/2019   TSH 1.334 06/20/2019   TSH 1.53 03/14/2014   TSH 1.58 03/13/2013   TSH 1.44 04/12/2012   TSH 1.35 08/18/2010    No postsurgical hypocalcemia.  Calcium was 9.0 (07/17/2019).  She has no FH of thyroid disorders. No FH of thyroid cancer. No h/o radiation tx to head or neck.  No herbal supplements. No Biotin use. No recent steroids use.   He is on B12 supplements.  He has abdominal pain lately and had a colonoscopy yesterday.  ROS: Constitutional: no weight gain/+ weight loss, no fatigue, no subjective hyperthermia, no subjective hypothermia, + nocturia Eyes: no blurry vision, no xerophthalmia ENT: no sore throat, + see HPI Cardiovascular: no CP/no SOB/no palpitations/no leg swelling Respiratory: no cough/no SOB/no wheezing Gastrointestinal: no N/no V/no D/no C/no acid reflux, + AP Musculoskeletal: no muscle aches/+ joint aches (back) Skin: no rashes, no hair loss Neurological: no tremors/no numbness/no tingling/no dizziness  I reviewed pt's medications, allergies, PMH, social  hx, family hx, and changes were documented in the history of present illness. Otherwise, unchanged from my initial visit note.  Past Medical History:  Diagnosis Date  . ALLERGIC  RHINITIS 01/30/2007  . Arthritis    "hands" (05/29/2014)  . Cancer ([place])    lung, prostate  . Constipation   . Coronary artery disease    LHC (05/29/14): pLAD 50-70, pRCA 99 (L>R collats), EF 55% >> PCI: 3.5 x 28 mm Xience [employer]pine DES to [employer]  . Cough secondary to angiotensin converting enzyme inhibitor (ACE-I) 05/28/2013  . ED (erectile dysfunction)   . GERD 09/26/2007  . HYPERLIPIDEMIA 09/26/2007  . Hypertension   . Lung cancer ([place]) dx'd 03/2018  . PLANTAR FASCIITIS, BILATERAL 07/08/2009  . Pre-diabetes   . Prostate CA ([place]) dx'd 2017 or 2018   xrt  . Unspecified hearing loss 06/16/2009   wears aides both ears   Past Surgical History:  Procedure Laterality Date  . CARDIAC CATHETERIZATION  05/29/2014   Procedure: CORONARY STENT INTERVENTION;  Surgeon: Sinclair Grooms, MD;  Location: [facility] CATH LAB;  Service: Cardiovascular;;  Prox RCA  . COLONOSCOPY    . CORONARY ANGIOPLASTY WITH STENT PLACEMENT  05/29/2014   "1"  . KNEE ARTHROSCOPY Right ~ 2005  . LEFT HEART CATHETERIZATION WITH CORONARY ANGIOGRAM N/A 05/29/2014   Procedure: LEFT HEART CATHETERIZATION WITH CORONARY ANGIOGRAM;  Surgeon: Sinclair Grooms, MD;  Location: [facility] CATH LAB;  Service: Cardiovascular;  Laterality: N/A;  . THYROIDECTOMY N/A 06/25/2019   Procedure: TOTAL THYROIDECTOMY;  Surgeon: Armandina Gemma, MD;  Location: [facility] ORS;  Service: General;  Laterality: N/A;  . VIDEO BRONCHOSCOPY WITH ENDOBRONCHIAL ULTRASOUND N/A 02/20/2019   Procedure: VIDEO BRONCHOSCOPY WITH ENDOBRONCHIAL ULTRASOUND;  Surgeon: Garner Nash, DO;  Location: [facility];  Service: Thoracic;  Laterality: N/A;   Social History   Socioeconomic History  . Marital status: Married    Spouse name: Not on file  . Number of children: Not on file  . Years of education: Not on file  . Highest education level: Not on file  Occupational History  . Not on file  Tobacco Use  . Smoking status: Former Smoker    Packs/day: 0.20    Years: 15.00    Pack years: 3.00    Types:  Cigarettes    Quit date: 05/24/1978    Years since quitting: 41.5  . Smokeless tobacco: Never Used  . Tobacco comment: 05/29/2014 "smoked socially; nothing since 1980"  Vaping Use  . Vaping Use: Never used  Substance and Sexual Activity  . Alcohol use: Yes    Alcohol/week: 2.0 standard drinks    Types: 2 Shots of liquor per week    Comment: beer or two a week  . Drug use: No  . Sexual activity: Not Currently  Other Topics Concern  . Not on file  Social History Narrative   Lives with wife in a one story home.  Has 2 children.  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  Education: 3 years of college.    Does wood working.    Social Determinants of Health   Financial Resource Strain:   . Difficulty of Paying Living Expenses:   Food Insecurity:   . Worried About Charity fundraiser in the Last Year:   . Arboriculturist in the Last Year:   Transportation Needs:   . Film/video editor (Medical):   Marland Kitchen Lack of Transportation (Non-Medical):   Physical Activity:   . Days of Exercise per  Week:   . Minutes of Exercise per Session:   Stress:   . Feeling of Stress :   Social Connections:   . Frequency of Communication with Friends and Family:   . Frequency of Social Gatherings with Friends and Family:   . Attends Religious Services:   . Active Member of Clubs or Organizations:   . Attends Archivist Meetings:   Marland Kitchen Marital Status:   Intimate Partner Violence:   . Fear of Current or Ex-Partner:   . Emotionally Abused:   Marland Kitchen Physically Abused:   . Sexually Abused:    Current Outpatient Medications on File Prior to Visit  Medication Sig Dispense Refill  . calcium carbonate (TUMS - DOSED IN MG ELEMENTAL CALCIUM) 500 MG chewable tablet Chew 1 tablet by mouth daily.    . cetirizine (ZYRTEC) 10 MG tablet Take 1 tablet (10 mg total) by mouth daily. (Patient not taking: Reported on 10/25/2019) 30 tablet 11  . ezetimibe (ZETIA) 10 MG tablet Take 1 tablet (10 mg total) by mouth daily. 90 tablet 2  .  fluticasone (FLONASE) 50 MCG/ACT nasal spray SHAKE LIQUID AND USE 1 SPRAY IN EACH NOSTRIL DAILY (Patient taking differently: Place 1 spray into both nostrils daily. ) 48 g 0  . folic acid (FOLVITE) 1 MG tablet Take 1 tablet (1 mg total) by mouth daily. 30 tablet 2  . levothyroxine (SYNTHROID) 100 MCG tablet Take 1 tablet (100 mcg total) by mouth daily. 90 tablet 3  . losartan (COZAAR) 50 MG tablet Take 1 tablet (50 mg total) by mouth daily. Please keep upcoming appt in April with Dr. Radford Pax before anymore refills. Thank you 90 tablet 0  . metoprolol tartrate (LOPRESSOR) 25 MG tablet TAKE 1 TABLET BY MOUTH TWICE DAILY 180 tablet 0  . Multiple Vitamin (MULTIVITAMIN WITH MINERALS) TABS tablet Take 1 tablet by mouth daily.    Marland Kitchen omeprazole (PRILOSEC) 40 MG capsule Take 1 capsule (40 mg total) by mouth 2 (two) times daily. 60 capsule 3  . oxyCODONE (ROXICODONE) 5 MG immediate release tablet Take 1 tablet (5 mg total) by mouth every 6 (six) hours as needed for severe pain. 30 tablet 0  . simvastatin (ZOCOR) 40 MG tablet TAKE 1 TABLET(40 MG) BY MOUTH DAILY AT 6 PM (Patient taking differently: Take 40 mg by mouth daily at 6 PM. ) 90 tablet 3  . STOOL SOFTENER 100 MG capsule Take 100 mg by mouth daily as needed.    . vitamin B-12 (CYANOCOBALAMIN) 50 MCG tablet Take 50 mcg by mouth daily.     No current facility-administered medications on file prior to visit.   Allergies  Allergen Reactions  . Lisinopril Cough   Family History  Problem Relation Age of Onset  . Heart attack Mother   . Heart disease Mother   . Heart attack Father   . Heart disease Father   . COPD Neg Hx        family hx  . Hyperlipidemia Neg Hx        family hx    PE: BP 120/60   Pulse 95   Ht _0  (1.702 m)   Wt 190 lb (86.2 kg)   SpO2 99%   BMI 29.76 kg/m  Wt Readings from Last 3 Encounters:  12/11/19 190 lb (86.2 kg)  12/03/19 196 lb (88.9 kg)  11/13/19 199 lb 6.4 oz (90.4 kg)   Constitutional: overweight, in  NAD Eyes: PERRLA, EOMI, no exophthalmos ENT: moist mucous membranes, thyroidectomy  scar healed, no neck masses palpated, no cervical lymphadenopathy Cardiovascular: RRR, No MRG Respiratory: CTA B Gastrointestinal: abdomen soft, NT, ND, BS+ Musculoskeletal: no deformities, strength intact in all 4 Skin: moist, warm, no rashes Neurological: no tremor with outstretched hands, DTR normal in all 4  ASSESSMENT: 1. Thyroid cancer - see HPI  2. Postsurgical Hypothyroidism  PLAN:  1. Thyroid cancer - papillary -I reviewed his thyroid cancer characteristics - he is stage [id number] (clinical stage II) -with capsular invasion.  At last visit, I recommended RAI treatment and I explained that this is mainly to facilitate monitoring in the long run (by checking thyroglobulin).  However, he did not have the RAI treatment yet.  He did have a recent PET scan in 10/2019 that showed recurrence of his lung cancer in the right suprahilar region. -Of note, there were changes on the thyroid pathology consistent with sarcoidosis, but he does not have a previous diagnosis of this condition.  I forwarded these results to patient's PCP at last visit. -I again recommended the RAI treatment and we will check his thyroglobulin and ATA antibodies after he has this.  However, for now, he would like to focus his attention on his abdominal pain and also his lung cancer recurrence and we decided to discuss again about the RAI treatment at next visit -I will see him back in 6 months.  2.  Patient with history of total thyroidectomy for thyroid cancer, now with iatrogenic hypothyroidism, on levothyroxine therapy.  He is usually having labs outside my office and I was not aware of his most recent labs, after our last visit... -We discussed at this visit that ideally, he will have labs in my office so I can receive the results and adjust his levothyroxine accordingly - latest thyroid labs reviewed with pt >> elevated: Lab Results   Component Value Date   TSH 5.895 (H) 11/13/2019   - he continues on LT4 100 mcg daily - pt feels good on this dose. - we discussed about taking the thyroid hormone every day, with water, >30 minutes before breakfast, separated by >4 hours from acid reflux medications, calcium, iron, multivitamins. Pt. is taking it correctly. - will check thyroid tests today: TSH and fT4 - If labs are abnormal, he will need to return for repeat TFTs in 1.5 months  Office Visit on 12/11/2019  Component Date Value Ref Range Status  . TSH 12/11/2019 7.06* 0.35 - 4.50 uIU/mL Final  . Free T4 12/11/2019 1.08  0.60 - 1.60 ng/dL Final   Comment: Specimens from patients who are undergoing biotin therapy and /or ingesting biotin supplements may contain high levels of biotin.  The higher biotin concentration in these specimens interferes with this Free T4 assay.  Specimens that contain high levels  of biotin may cause false high results for this Free T4 assay.  Please interpret results in light of the total clinical presentation of the patient.     TSH is elevated.  We will increase his levothyroxine dose to 125 mcg daily and have him back for recheck in 1.5 months.  Philemon Kingdom, MD PhD [facility] Endocrinology

## 2019-12-11 NOTE — Telephone Encounter (Signed)
LVM re appts this week.

## 2019-12-11 NOTE — Discharge Instructions (Addendum)
Liver Biopsy, Care After These instructions give you information about how to care for yourself after your procedure. Your health care provider may also give you more specific instructions. If you have problems or questions, contact your health care provider. What can I expect after the procedure? After your procedure, it is common to have:  Pain and soreness in the area where the biopsy was done.  Bruising around the area where the biopsy was done.  Sleepiness and fatigue for 1-2 days. Follow these instructions at home: Medicines  Take over-the-counter and prescription medicines only as told by your health care provider.  If you were prescribed an antibiotic medicine, take it as told by your health care provider. Do not stop taking the antibiotic even if you start to feel better.  Do not take medicines such as aspirin and ibuprofen unless your health care provider tells you to take them. These medicines thin your blood and can increase the risk of bleeding.  If you are taking prescription pain medicine, take actions to prevent or treat constipation. Your health care provider may recommend that you: ? Drink enough fluid to keep your urine pale yellow. ? Eat foods that are high in fiber, such as fresh fruits and vegetables, whole grains, and beans. ? Limit foods that are high in fat and processed sugars, such as fried or sweet foods. ? Take an over-the-counter or prescription medicine for constipation. Incision care  Follow instructions from your health care provider about how to take care of your incision. Make sure you: ? Wash your hands with soap and water before you change your bandage (dressing). If soap and water are not available, use hand sanitizer. ? Remove your dressing as told by your health care provider. In 24 hours  Check your incision area every day for signs of infection. Check for: ? Redness, swelling, or pain. ? Fluid or blood. ? Warmth. ? Pus or a bad smell.  Do  not take baths, swim, or use a hot tub until your health care provider says it is okay to do so. Activity   Rest at home for 1-2 days, or as directed by your health care provider. ? Avoid sitting for a long time without moving. Get up to take short walks every 1-2 hours. This is important to improve blood flow and breathing. Ask for help if you feel weak or unsteady.  Return to your normal activities as told by your health care provider. Ask your health care provider what activities are safe for you.  Do not drive or use heavy machinery while taking prescription pain medicine.  Do not lift anything that is heavier than 10 lb (4.5 kg), or the limit that your health care provider tells you, until he or she says that it is safe. 10 days  Do not play contact sports for 2 weeks after the procedure. General instructions   Do not drink alcohol in the first week after the procedure.  Have someone stay with you for at least 24 hours after the procedure.  It is your responsibility to obtain your test results. Ask your health care provider, or the department that is doing the test: ? When will my results be ready? ? How will I get my results? ? What are my treatment options? ? What other tests do I need? ? What are my next steps?  Keep all follow-up visits as told by your health care provider. This is important. Contact a health care provider if:  You  have increased bleeding from an incision, resulting in more than a small spot of blood.  You have redness, swelling, or increasing pain in any incisions.  You notice a discharge or a bad smell coming from any of your incisions.  You have a fever or chills. Get help right away if:  You develop swelling, bloating, or pain in your abdomen.  You become dizzy or faint.  You develop a rash.  You have nausea or you vomit.  You faint, or you have shortness of breath or difficulty breathing.  You develop chest pain.  You have problems  with your speech or vision.  You have trouble with your balance or moving your arms or legs. Summary  After the liver biopsy, it is common to have pain, soreness, and bruising in the area, as well as sleepiness and fatigue.  Take over-the-counter and prescription medicines only as told by your health care provider.  Follow instructions from your health care provider about how to care for your incision. Check the incision area daily for signs of infection. This information is not intended to replace advice given to you by your health care provider. Make sure you discuss any questions you have with your health care provider. Document Revised: 07/03/2018 Document Reviewed: 05/20/2017 Elsevier Patient Education  [place].   Moderate Conscious Sedation, Adult, Care After These instructions provide you with information about caring for yourself after your procedure. Your health care provider may also give you more specific instructions. Your treatment has been planned according to current medical practices, but problems sometimes occur. Call your health care provider if you have any problems or questions after your procedure. What can I expect after the procedure? After your procedure, it is common:  To feel sleepy for several hours.  To feel clumsy and have poor balance for several hours.  To have poor judgment for several hours.  To vomit if you eat too soon. Follow these instructions at home: For at least 24 hours after the procedure:   Do not: ? Participate in activities where you could fall or become injured. ? Drive. ? Use heavy machinery. ? Drink alcohol. ? Take sleeping pills or medicines that cause drowsiness. ? Make important decisions or sign legal documents. ? Take care of children on your own.  Rest. Eating and drinking  Follow the diet recommended by your health care provider.  If you vomit: ? Drink water, juice, or soup when you can drink without  vomiting. ? Make sure you have little or no nausea before eating solid foods. General instructions  Have a responsible adult stay with you until you are awake and alert.  Take over-the-counter and prescription medicines only as told by your health care provider.  If you smoke, do not smoke without supervision.  Keep all follow-up visits as told by your health care provider. This is important. Contact a health care provider if:  You keep feeling nauseous or you keep vomiting.  You feel light-headed.  You develop a rash.  You have a fever. Get help right away if:  You have trouble breathing. This information is not intended to replace advice given to you by your health care provider. Make sure you discuss any questions you have with your health care provider. Document Revised: 04/22/2017 Document Reviewed: 08/30/2015 Elsevier Patient Education  2020 [employer].

## 2019-12-13 ENCOUNTER — Inpatient Hospital Stay: Payer: Medicare Other

## 2019-12-13 ENCOUNTER — Other Ambulatory Visit: Payer: Self-pay

## 2019-12-13 VITALS — BP 106/74 | HR 62 | Temp 98.6°F | Resp 16 | Wt 191.8 lb

## 2019-12-13 DIAGNOSIS — E785 Hyperlipidemia, unspecified: Secondary | ICD-10-CM | POA: Diagnosis not present

## 2019-12-13 DIAGNOSIS — Z5112 Encounter for antineoplastic immunotherapy: Secondary | ICD-10-CM | POA: Diagnosis not present

## 2019-12-13 DIAGNOSIS — I2510 Atherosclerotic heart disease of native coronary artery without angina pectoris: Secondary | ICD-10-CM | POA: Diagnosis not present

## 2019-12-13 DIAGNOSIS — C786 Secondary malignant neoplasm of retroperitoneum and peritoneum: Secondary | ICD-10-CM | POA: Diagnosis not present

## 2019-12-13 DIAGNOSIS — Z5111 Encounter for antineoplastic chemotherapy: Secondary | ICD-10-CM | POA: Diagnosis not present

## 2019-12-13 DIAGNOSIS — Z923 Personal history of irradiation: Secondary | ICD-10-CM | POA: Diagnosis not present

## 2019-12-13 DIAGNOSIS — Z79899 Other long term (current) drug therapy: Secondary | ICD-10-CM | POA: Diagnosis not present

## 2019-12-13 DIAGNOSIS — I10 Essential (primary) hypertension: Secondary | ICD-10-CM | POA: Diagnosis not present

## 2019-12-13 DIAGNOSIS — D6481 Anemia due to antineoplastic chemotherapy: Secondary | ICD-10-CM | POA: Diagnosis not present

## 2019-12-13 DIAGNOSIS — G893 Neoplasm related pain (acute) (chronic): Secondary | ICD-10-CM | POA: Diagnosis not present

## 2019-12-13 DIAGNOSIS — C3491 Malignant neoplasm of unspecified part of right bronchus or lung: Secondary | ICD-10-CM

## 2019-12-13 DIAGNOSIS — C787 Secondary malignant neoplasm of liver and intrahepatic bile duct: Secondary | ICD-10-CM | POA: Diagnosis not present

## 2019-12-13 DIAGNOSIS — K219 Gastro-esophageal reflux disease without esophagitis: Secondary | ICD-10-CM | POA: Diagnosis not present

## 2019-12-13 DIAGNOSIS — C3411 Malignant neoplasm of upper lobe, right bronchus or lung: Secondary | ICD-10-CM | POA: Diagnosis not present

## 2019-12-13 DIAGNOSIS — Z85850 Personal history of malignant neoplasm of thyroid: Secondary | ICD-10-CM | POA: Diagnosis not present

## 2019-12-13 LAB — CMP (CANCER CENTER ONLY)
ALT: 43 U/L (ref 0–44)
AST: 52 U/L — ABNORMAL HIGH (ref 15–41)
Albumin: 3.2 g/dL — ABNORMAL LOW (ref 3.5–5.0)
Alkaline Phosphatase: 196 U/L — ABNORMAL HIGH (ref 38–126)
Anion gap: 10 (ref 5–15)
BUN: 11 mg/dL (ref 8–23)
CO2: 24 mmol/L (ref 22–32)
Calcium: 9.3 mg/dL (ref 8.9–10.3)
Chloride: 104 mmol/L (ref 98–111)
Creatinine: 1.19 mg/dL (ref 0.61–1.24)
GFR, Est AFR Am: >60 mL/min (ref >60)
GFR, Estimated: 59 mL/min — ABNORMAL LOW (ref >60)
Glucose, Bld: 100 mg/dL — ABNORMAL HIGH (ref 70–99)
Potassium: 4.8 mmol/L (ref 3.5–5.1)
Sodium: 138 mmol/L (ref 135–145)
Total Bilirubin: 0.6 mg/dL (ref 0.3–1.2)
Total Protein: 7 g/dL (ref 6.5–8.1)

## 2019-12-13 LAB — CBC WITH DIFFERENTIAL (CANCER CENTER ONLY)
Abs Immature Granulocytes: 0.05 10*3/uL (ref 0.00–0.07)
Basophils Absolute: 0 10*3/uL (ref 0.0–0.1)
Basophils Relative: 0 %
Eosinophils Absolute: 0.3 10*3/uL (ref 0.0–0.5)
Eosinophils Relative: 3 %
HCT: 33.7 % — ABNORMAL LOW (ref 39.0–52.0)
Hemoglobin: 10.4 g/dL — ABNORMAL LOW (ref 13.0–17.0)
Immature Granulocytes: 1 %
Lymphocytes Relative: 12 %
Lymphs Abs: 1 10*3/uL (ref 0.7–4.0)
MCH: 26 pg (ref 26.0–34.0)
MCHC: 30.9 g/dL (ref 30.0–36.0)
MCV: 84.3 fL (ref 80.0–100.0)
Monocytes Absolute: 1.1 10*3/uL — ABNORMAL HIGH (ref 0.1–1.0)
Monocytes Relative: 13 %
Neutro Abs: 6.4 10*3/uL (ref 1.7–7.7)
Neutrophils Relative %: 71 %
Platelet Count: 405 10*3/uL — ABNORMAL HIGH (ref 150–400)
RBC: 4 MIL/uL — ABNORMAL LOW (ref 4.22–5.81)
RDW: 14.4 % (ref 11.5–15.5)
WBC Count: 8.9 10*3/uL (ref 4.0–10.5)
nRBC: 0 % (ref 0.0–0.2)

## 2019-12-13 LAB — TSH: TSH: 5.15 u[IU]/mL — ABNORMAL HIGH (ref 0.320–4.118)

## 2019-12-13 MED ORDER — PALONOSETRON HCL INJECTION 0.25 MG/5ML
INTRAVENOUS | Status: AC
  Filled 2019-12-13: qty 5

## 2019-12-13 MED ORDER — SODIUM CHLORIDE 0.9 % IV SOLN
426.5000 mg | Freq: Once | INTRAVENOUS | Status: AC
  Administered 2019-12-13: 430 mg via INTRAVENOUS
  Filled 2019-12-13: qty 43

## 2019-12-13 MED ORDER — SODIUM CHLORIDE 0.9 % IV SOLN
200.0000 mg | Freq: Once | INTRAVENOUS | Status: AC
  Administered 2019-12-13: 200 mg via INTRAVENOUS
  Filled 2019-12-13: qty 8

## 2019-12-13 MED ORDER — SODIUM CHLORIDE 0.9 % IV SOLN
Freq: Once | INTRAVENOUS | Status: AC
  Filled 2019-12-13: qty 250

## 2019-12-13 MED ORDER — SODIUM CHLORIDE 0.9 % IV SOLN
150.0000 mg | Freq: Once | INTRAVENOUS | Status: AC
  Administered 2019-12-13: 150 mg via INTRAVENOUS
  Filled 2019-12-13: qty 150

## 2019-12-13 MED ORDER — PALONOSETRON HCL INJECTION 0.25 MG/5ML
0.2500 mg | Freq: Once | INTRAVENOUS | Status: AC
  Administered 2019-12-13: 0.25 mg via INTRAVENOUS

## 2019-12-13 MED ORDER — SODIUM CHLORIDE 0.9 % IV SOLN
500.0000 mg/m2 | Freq: Once | INTRAVENOUS | Status: AC
  Administered 2019-12-13: 1000 mg via INTRAVENOUS
  Filled 2019-12-13: qty 40

## 2019-12-13 MED ORDER — SODIUM CHLORIDE 0.9 % IV SOLN
10.0000 mg | Freq: Once | INTRAVENOUS | Status: AC
  Administered 2019-12-13: 10 mg via INTRAVENOUS
  Filled 2019-12-13: qty 10

## 2019-12-13 NOTE — Patient Instructions (Signed)
[facility] Discharge Instructions for Patients Receiving Chemotherapy  Today you received the following chemotherapy agents: Keytruda, alimta, Carboplatin   To help prevent nausea and vomiting after your treatment, we encourage you to take your nausea medication as directed.    If you develop nausea and vomiting that is not controlled by your nausea medication, call the clinic.   BELOW ARE SYMPTOMS THAT SHOULD BE REPORTED IMMEDIATELY:  *FEVER GREATER THAN 100.5 F  *CHILLS WITH OR WITHOUT FEVER  NAUSEA AND VOMITING THAT IS NOT CONTROLLED WITH YOUR NAUSEA MEDICATION  *UNUSUAL SHORTNESS OF BREATH  *UNUSUAL BRUISING OR BLEEDING  TENDERNESS IN MOUTH AND THROAT WITH OR WITHOUT PRESENCE OF ULCERS  *URINARY PROBLEMS  *BOWEL PROBLEMS  UNUSUAL RASH Items with * indicate a potential emergency and should be followed up as soon as possible.  Feel free to call the clinic should you have any questions or concerns. The clinic phone number is [phone].  Please show the [facility] at check-in to the Emergency Department and triage nurse.  Pembrolizumab injection What is this medicine? PEMBROLIZUMAB (pem broe liz ue mab) is a monoclonal antibody. It is used to treat certain types of cancer. This medicine may be used for other purposes; ask your health care provider or pharmacist if you have questions. COMMON BRAND NAME(S): Keytruda What should I tell my health care provider before I take this medicine? They need to know if you have any of these conditions:  diabetes  immune system problems  inflammatory bowel disease  liver disease  lung or breathing disease  lupus  received or scheduled to receive an organ transplant or a stem-cell transplant that uses donor stem cells  an unusual or allergic reaction to pembrolizumab, other medicines, foods, dyes, or preservatives  pregnant or trying to get pregnant  breast-feeding How should I use this  medicine? This medicine is for infusion into a vein. It is given by a health care professional in a hospital or clinic setting. A special MedGuide will be given to you before each treatment. Be sure to read this information carefully each time. Talk to your pediatrician regarding the use of this medicine in children. While this drug may be prescribed for children as young as 6 months for selected conditions, precautions do apply. Overdosage: If you think you have taken too much of this medicine contact a poison control center or emergency room at once. NOTE: This medicine is only for you. Do not share this medicine with others. What if I miss a dose? It is important not to miss your dose. Call your doctor or health care professional if you are unable to keep an appointment. What may interact with this medicine? Interactions have not been studied. Give your health care provider a list of all the medicines, herbs, non-prescription drugs, or dietary supplements you use. Also tell them if you smoke, drink alcohol, or use illegal drugs. Some items may interact with your medicine. This list may not describe all possible interactions. Give your health care provider a list of all the medicines, herbs, non-prescription drugs, or dietary supplements you use. Also tell them if you smoke, drink alcohol, or use illegal drugs. Some items may interact with your medicine. What should I watch for while using this medicine? Your condition will be monitored carefully while you are receiving this medicine. You may need blood work done while you are taking this medicine. Do not become pregnant while taking this medicine or for 4 months after  stopping it. Women should inform their doctor if they wish to become pregnant or think they might be pregnant. There is a potential for serious side effects to an unborn child. Talk to your health care professional or pharmacist for more information. Do not breast-feed an infant while  taking this medicine or for 4 months after the last dose. What side effects may I notice from receiving this medicine? Side effects that you should report to your doctor or health care professional as soon as possible:  allergic reactions like skin rash, itching or hives, swelling of the face, lips, or tongue  bloody or black, tarry  breathing problems  changes in vision  chest pain  chills  confusion  constipation  cough  diarrhea  dizziness or feeling faint or lightheaded  fast or irregular heartbeat  fever  flushing  joint pain  low blood counts - this medicine may decrease the number of white blood cells, red blood cells and platelets. You may be at increased risk for infections and bleeding.  muscle pain  muscle weakness  pain, tingling, numbness in the hands or feet  persistent headache  redness, blistering, peeling or loosening of the skin, including inside the mouth  signs and symptoms of high blood sugar such as dizziness; dry mouth; dry skin; fruity breath; nausea; stomach pain; increased hunger or thirst; increased urination  signs and symptoms of kidney injury like trouble passing urine or change in the amount of urine  signs and symptoms of liver injury like dark urine, light-colored stools, loss of appetite, nausea, right upper belly pain, yellowing of the eyes or skin  sweating  swollen lymph nodes  weight loss Side effects that usually do not require medical attention (report to your doctor or health care professional if they continue or are bothersome):  decreased appetite  hair loss  muscle pain  tiredness This list may not describe all possible side effects. Call your doctor for medical advice about side effects. You may report side effects to FDA at 1-800-FDA-1088. Where should I keep my medicine? This drug is given in a hospital or clinic and will not be stored at home. NOTE: This sheet is a summary. It may not cover all  possible information. If you have questions about this medicine, talk to your doctor, pharmacist, or health care provider.  2020 Elsevier/Gold Standard (2019-03-16 18:07:58)  Pemetrexed injection What is this medicine? PEMETREXED (PEM e TREX ed) is a chemotherapy drug used to treat lung cancers like non-small cell lung cancer and mesothelioma. It may also be used to treat other cancers. This medicine may be used for other purposes; ask your health care provider or pharmacist if you have questions. COMMON BRAND NAME(S): Alimta What should I tell my health care provider before I take this medicine? They need to know if you have any of these conditions:  infection (especially a virus infection such as chickenpox, cold sores, or herpes)  kidney disease  low blood counts, like low white cell, platelet, or red cell counts  lung or breathing disease, like asthma  radiation therapy  an unusual or allergic reaction to pemetrexed, other medicines, foods, dyes, or preservative  pregnant or trying to get pregnant  breast-feeding How should I use this medicine? This drug is given as an infusion into a vein. It is administered in a hospital or clinic by a specially trained health care professional. Talk to your pediatrician regarding the use of this medicine in children. Special care may be needed.  Overdosage: If you think you have taken too much of this medicine contact a poison control center or emergency room at once. NOTE: This medicine is only for you. Do not share this medicine with others. What if I miss a dose? It is important not to miss your dose. Call your doctor or health care professional if you are unable to keep an appointment. What may interact with this medicine? This medicine may interact with the following medications:  Ibuprofen This list may not describe all possible interactions. Give your health care provider a list of all the medicines, herbs, non-prescription drugs,  or dietary supplements you use. Also tell them if you smoke, drink alcohol, or use illegal drugs. Some items may interact with your medicine. What should I watch for while using this medicine? Visit your doctor for checks on your progress. This drug may make you feel generally unwell. This is not uncommon, as chemotherapy can affect healthy cells as well as cancer cells. Report any side effects. Continue your course of treatment even though you feel ill unless your doctor tells you to stop. In some cases, you may be given additional medicines to help with side effects. Follow all directions for their use. Call your doctor or health care professional for advice if you get a fever, chills or sore throat, or other symptoms of a cold or flu. Do not treat yourself. This drug decreases your body's ability to fight infections. Try to avoid being around people who are sick. This medicine may increase your risk to bruise or bleed. Call your doctor or health care professional if you notice any unusual bleeding. Be careful brushing and flossing your teeth or using a toothpick because you may get an infection or bleed more easily. If you have any dental work done, tell your dentist you are receiving this medicine. Avoid taking products that contain aspirin, acetaminophen, ibuprofen, naproxen, or ketoprofen unless instructed by your doctor. These medicines may hide a fever. Call your doctor or health care professional if you get diarrhea or mouth sores. Do not treat yourself. To protect your kidneys, drink water or other fluids as directed while you are taking this medicine. Do not become pregnant while taking this medicine or for 6 months after stopping it. Women should inform their doctor if they wish to become pregnant or think they might be pregnant. Men should not father a child while taking this medicine and for 3 months after stopping it. This may interfere with the ability to father a child. You should talk to  your doctor or health care professional if you are concerned about your fertility. There is a potential for serious side effects to an unborn child. Talk to your health care professional or pharmacist for more information. Do not breast-feed an infant while taking this medicine or for 1 week after stopping it. What side effects may I notice from receiving this medicine? Side effects that you should report to your doctor or health care professional as soon as possible:  allergic reactions like skin rash, itching or hives, swelling of the face, lips, or tongue  breathing problems  redness, blistering, peeling or loosening of the skin, including inside the mouth  signs and symptoms of bleeding such as bloody or black, tarry stools; red or dark-brown urine; spitting up blood or brown material that looks like coffee grounds; red spots on the skin; unusual bruising or bleeding from the eye, gums, or nose  signs and symptoms of infection like fever or  chills; cough; sore throat; pain or trouble passing urine  signs and symptoms of kidney injury like trouble passing urine or change in the amount of urine  signs and symptoms of liver injury like dark yellow or brown urine; general ill feeling or flu-like symptoms; light-colored stools; loss of appetite; nausea; right upper belly pain; unusually weak or tired; yellowing of the eyes or skin Side effects that usually do not require medical attention (report to your doctor or health care professional if they continue or are bothersome):  constipation  mouth sores  nausea, vomiting  unusually weak or tired This list may not describe all possible side effects. Call your doctor for medical advice about side effects. You may report side effects to FDA at 1-800-FDA-1088. Where should I keep my medicine? This drug is given in a hospital or clinic and will not be stored at home. NOTE: This sheet is a summary. It may not cover all possible information. If  you have questions about this medicine, talk to your doctor, pharmacist, or health care provider.  2020 Elsevier/Gold Standard (2017-06-29 16:11:33)  Carboplatin injection What is this medicine? CARBOPLATIN (KAR boe pla tin) is a chemotherapy drug. It targets fast dividing cells, like cancer cells, and causes these cells to die. This medicine is used to treat ovarian cancer and many other cancers. This medicine may be used for other purposes; ask your health care provider or pharmacist if you have questions. COMMON BRAND NAME(S): Paraplatin What should I tell my health care provider before I take this medicine? They need to know if you have any of these conditions:  blood disorders  hearing problems  kidney disease  recent or ongoing radiation therapy  an unusual or allergic reaction to carboplatin, cisplatin, other chemotherapy, other medicines, foods, dyes, or preservatives  pregnant or trying to get pregnant  breast-feeding How should I use this medicine? This drug is usually given as an infusion into a vein. It is administered in a hospital or clinic by a specially trained health care professional. Talk to your pediatrician regarding the use of this medicine in children. Special care may be needed. Overdosage: If you think you have taken too much of this medicine contact a poison control center or emergency room at once. NOTE: This medicine is only for you. Do not share this medicine with others. What if I miss a dose? It is important not to miss a dose. Call your doctor or health care professional if you are unable to keep an appointment. What may interact with this medicine?  medicines for seizures  medicines to increase blood counts like filgrastim, pegfilgrastim, sargramostim  some antibiotics like amikacin, gentamicin, neomycin, streptomycin, tobramycin  vaccines Talk to your doctor or health care professional before taking any of these  medicines:  acetaminophen  aspirin  ibuprofen  ketoprofen  naproxen This list may not describe all possible interactions. Give your health care provider a list of all the medicines, herbs, non-prescription drugs, or dietary supplements you use. Also tell them if you smoke, drink alcohol, or use illegal drugs. Some items may interact with your medicine. What should I watch for while using this medicine? Your condition will be monitored carefully while you are receiving this medicine. You will need important blood work done while you are taking this medicine. This drug may make you feel generally unwell. This is not uncommon, as chemotherapy can affect healthy cells as well as cancer cells. Report any side effects. Continue your course of treatment even  though you feel ill unless your doctor tells you to stop. In some cases, you may be given additional medicines to help with side effects. Follow all directions for their use. Call your doctor or health care professional for advice if you get a fever, chills or sore throat, or other symptoms of a cold or flu. Do not treat yourself. This drug decreases your body's ability to fight infections. Try to avoid being around people who are sick. This medicine may increase your risk to bruise or bleed. Call your doctor or health care professional if you notice any unusual bleeding. Be careful brushing and flossing your teeth or using a toothpick because you may get an infection or bleed more easily. If you have any dental work done, tell your dentist you are receiving this medicine. Avoid taking products that contain aspirin, acetaminophen, ibuprofen, naproxen, or ketoprofen unless instructed by your doctor. These medicines may hide a fever. Do not become pregnant while taking this medicine. Women should inform their doctor if they wish to become pregnant or think they might be pregnant. There is a potential for serious side effects to an unborn child. Talk  to your health care professional or pharmacist for more information. Do not breast-feed an infant while taking this medicine. What side effects may I notice from receiving this medicine? Side effects that you should report to your doctor or health care professional as soon as possible:  allergic reactions like skin rash, itching or hives, swelling of the face, lips, or tongue  signs of infection - fever or chills, cough, sore throat, pain or difficulty passing urine  signs of decreased platelets or bleeding - bruising, pinpoint red spots on the skin, black, tarry stools, nosebleeds  signs of decreased red blood cells - unusually weak or tired, fainting spells, lightheadedness  breathing problems  changes in hearing  changes in vision  chest pain  high blood pressure  low blood counts - This drug may decrease the number of white blood cells, red blood cells and platelets. You may be at increased risk for infections and bleeding.  nausea and vomiting  pain, swelling, redness or irritation at the injection site  pain, tingling, numbness in the hands or feet  problems with balance, talking, walking  trouble passing urine or change in the amount of urine Side effects that usually do not require medical attention (report to your doctor or health care professional if they continue or are bothersome):  hair loss  loss of appetite  metallic taste in the mouth or changes in taste This list may not describe all possible side effects. Call your doctor for medical advice about side effects. You may report side effects to FDA at 1-800-FDA-1088. Where should I keep my medicine? This drug is given in a hospital or clinic and will not be stored at home. NOTE: This sheet is a summary. It may not cover all possible information. If you have questions about this medicine, talk to your doctor, pharmacist, or health care provider.  2020 Elsevier/Gold Standard (2007-08-15 14:38:05)

## 2019-12-16 ENCOUNTER — Encounter (HOSPITAL_COMMUNITY): Payer: Self-pay | Admitting: Emergency Medicine

## 2019-12-16 ENCOUNTER — Other Ambulatory Visit: Payer: Self-pay

## 2019-12-16 ENCOUNTER — Emergency Department (HOSPITAL_COMMUNITY)
Admission: EM | Admit: 2019-12-16 | Discharge: 2019-12-16 | Disposition: A | Discharge status: Home / Self Care | Payer: Medicare Other | Attending: Emergency Medicine | Admitting: Emergency Medicine

## 2019-12-16 DIAGNOSIS — R531 Weakness: Secondary | ICD-10-CM | POA: Insufficient documentation

## 2019-12-16 DIAGNOSIS — E119 Type 2 diabetes mellitus without complications: Secondary | ICD-10-CM | POA: Diagnosis not present

## 2019-12-16 DIAGNOSIS — Z87891 Personal history of nicotine dependence: Secondary | ICD-10-CM | POA: Insufficient documentation

## 2019-12-16 DIAGNOSIS — Z8546 Personal history of malignant neoplasm of prostate: Secondary | ICD-10-CM | POA: Diagnosis not present

## 2019-12-16 DIAGNOSIS — Z79899 Other long term (current) drug therapy: Secondary | ICD-10-CM | POA: Diagnosis not present

## 2019-12-16 DIAGNOSIS — Z5111 Encounter for antineoplastic chemotherapy: Secondary | ICD-10-CM | POA: Insufficient documentation

## 2019-12-16 DIAGNOSIS — Z955 Presence of coronary angioplasty implant and graft: Secondary | ICD-10-CM | POA: Diagnosis not present

## 2019-12-16 DIAGNOSIS — G5132 Clonic hemifacial spasm, left: Secondary | ICD-10-CM | POA: Diagnosis present

## 2019-12-16 DIAGNOSIS — I119 Hypertensive heart disease without heart failure: Secondary | ICD-10-CM | POA: Diagnosis not present

## 2019-12-16 LAB — BASIC METABOLIC PANEL
Anion gap: 15 (ref 5–15)
BUN: 15 mg/dL (ref 8–23)
CO2: 21 mmol/L — ABNORMAL LOW (ref 22–32)
Calcium: 8.6 mg/dL — ABNORMAL LOW (ref 8.9–10.3)
Chloride: 98 mmol/L (ref 98–111)
Creatinine, Ser: 1.14 mg/dL (ref 0.61–1.24)
GFR calc Af Amer: >60 mL/min (ref >60)
GFR calc non Af Amer: >60 mL/min (ref >60)
Glucose, Bld: 113 mg/dL — ABNORMAL HIGH (ref 70–99)
Potassium: 4.1 mmol/L (ref 3.5–5.1)
Sodium: 134 mmol/L — ABNORMAL LOW (ref 135–145)

## 2019-12-16 LAB — CBC
HCT: 37.3 % — ABNORMAL LOW (ref 39.0–52.0)
Hemoglobin: 11.5 g/dL — ABNORMAL LOW (ref 13.0–17.0)
MCH: 26.7 pg (ref 26.0–34.0)
MCHC: 30.8 g/dL (ref 30.0–36.0)
MCV: 86.5 fL (ref 80.0–100.0)
Platelets: 476 10*3/uL — ABNORMAL HIGH (ref 150–400)
RBC: 4.31 MIL/uL (ref 4.22–5.81)
RDW: 14.7 % (ref 11.5–15.5)
WBC: 13.9 10*3/uL — ABNORMAL HIGH (ref 4.0–10.5)
nRBC: 0 % (ref 0.0–0.2)

## 2019-12-16 MED ORDER — ONDANSETRON 8 MG PO TBDP: 8.0000 mg | ORAL_TABLET | Freq: Three times a day (TID) | ORAL | 0 refills | Status: AC | PRN

## 2019-12-16 MED ORDER — SODIUM CHLORIDE 0.9% FLUSH
3.0000 mL | Freq: Once | INTRAVENOUS | Status: AC
  Administered 2019-12-16: 3 mL via INTRAVENOUS

## 2019-12-16 MED ORDER — ONDANSETRON HCL 4 MG/2ML IJ SOLN
4.0000 mg | Freq: Once | INTRAMUSCULAR | Status: AC
  Administered 2019-12-16: 4 mg via INTRAVENOUS
  Filled 2019-12-16: qty 2

## 2019-12-16 MED ORDER — SODIUM CHLORIDE 0.9 % IV BOLUS (SEPSIS)
1000.0000 mL | Freq: Once | INTRAVENOUS | Status: AC
  Administered 2019-12-16: 1000 mL via INTRAVENOUS

## 2019-12-16 MED ORDER — SODIUM CHLORIDE 0.9 % IV SOLN
1.0000 g | Freq: Once | INTRAVENOUS | Status: AC
  Administered 2019-12-16: 1 g via INTRAVENOUS
  Filled 2019-12-16: qty 10

## 2019-12-16 MED ORDER — SODIUM CHLORIDE 0.9 % IV SOLN: 1000.0000 mL | INTRAVENOUS | Status: DC

## 2019-12-16 NOTE — ED Provider Notes (Signed)
[facility] EMERGENCY DEPARTMENT Provider Note   CSN: [id number] Arrival date & time: 12/16/19  1101     History Chief Complaint  Patient presents with   facial twitching    Carl Harrell is a 74 y.o. male.  HPI   Pt has been feeling weak and fatigued since his last chemo treatment.  He also had a colonoscopy and liver biopsy this past week.  He has noticed twitching on the left side of his face.  The sx started on Thursday night.   It has been coming and going.  It usually occurs when he is resting. No trouble with fevers.      No vomiting or diarrhea.  No difficulty moving arms or legs.  No trouble with speech.    Past Medical History:  Diagnosis Date   ALLERGIC RHINITIS 01/30/2007   Arthritis    "hands" (05/29/2014)   Cancer ([place])    lung, prostate   Constipation    Coronary artery disease    LHC (05/29/14): pLAD 50-70, pRCA 99 (L>R collats), EF 55% >> PCI: 3.5 x 28 mm Xience Alpine DES to pRCA   Cough secondary to angiotensin converting enzyme inhibitor (ACE-I) 05/28/2013   ED (erectile dysfunction)    GERD 09/26/2007   HYPERLIPIDEMIA 09/26/2007   Hypertension    Lung cancer ([place]) dx'd 03/2018   PLANTAR FASCIITIS, BILATERAL 07/08/2009   Pre-diabetes    Prostate CA ([place]) dx'd 2017 or 2018   xrt   Unspecified hearing loss 06/16/2009   wears aides both ears    Patient Active Problem List   Diagnosis Date Noted   Adenocarcinoma of right lung, stage 4 ([place]) 12/03/2019   Cancer related pain 12/03/2019   Neoplasm of uncertain behavior of thyroid gland 06/17/2019   Left thyroid nodule 06/17/2019   Encounter for antineoplastic immunotherapy 05/09/2019   Primary thyroid follicular carcinoma ([place]) 04/09/2019   Adenocarcinoma of right lung, stage 3 ([place]) 02/28/2019   Encounter for antineoplastic chemotherapy 02/28/2019   Goals of care, counseling/discussion 02/28/2019   Lung mass 02/15/2019   Mediastinal adenopathy 02/15/2019    Abnormal findings on diagnostic imaging of lung 02/15/2019   Mass of upper lobe of right lung 02/13/2019   Mediastinal lymphadenopathy 02/13/2019   Hyperlipidemia associated with type 2 diabetes mellitus ([place]) 07/16/2018   PAD (peripheral artery disease) ([place]) 03/14/2018   Hematochezia 02/20/2017   Prostate cancer ([place]) 02/07/2017   Controlled type 2 diabetes mellitus with complication, without long-term current use of insulin ([place]) 11/11/2016   Hypertension associated with diabetes ([place]) 11/11/2016   CAD (coronary artery disease) 06/20/2014   UNSPECIFIED HEARING LOSS 06/16/2009   GERD 09/26/2007   Allergic rhinitis 01/30/2007    Past Surgical History:  Procedure Laterality Date   CARDIAC CATHETERIZATION  05/29/2014   Procedure: CORONARY STENT INTERVENTION;  Surgeon: Sinclair Grooms, MD;  Location: [facility] CATH LAB;  Service: Cardiovascular;;  Prox RCA   COLONOSCOPY     CORONARY ANGIOPLASTY WITH STENT PLACEMENT  05/29/2014   "1"   KNEE ARTHROSCOPY Right ~ 2005   LEFT HEART CATHETERIZATION WITH CORONARY ANGIOGRAM N/A 05/29/2014   Procedure: LEFT HEART CATHETERIZATION WITH CORONARY ANGIOGRAM;  Surgeon: Sinclair Grooms, MD;  Location: [facility] CATH LAB;  Service: Cardiovascular;  Laterality: N/A;   THYROIDECTOMY N/A 06/25/2019   Procedure: TOTAL THYROIDECTOMY;  Surgeon: Armandina Gemma, MD;  Location: [facility] ORS;  Service: General;  Laterality: N/A;   VIDEO BRONCHOSCOPY WITH ENDOBRONCHIAL ULTRASOUND N/A 02/20/2019   Procedure:  VIDEO BRONCHOSCOPY WITH ENDOBRONCHIAL ULTRASOUND;  Surgeon: Garner Nash, DO;  Location: [facility] OR;  Service: Thoracic;  Laterality: N/A;       Family History  Problem Relation Age of Onset   Heart attack Mother    Heart disease Mother    Heart attack Father    Heart disease Father    COPD Neg Hx        family hx   Hyperlipidemia Neg Hx        family hx    Social History   Tobacco Use   Smoking status: Former Smoker    Packs/day: 0.20    Years:  15.00    Pack years: 3.00    Types: Cigarettes    Quit date: 05/24/1978    Years since quitting: 41.5   Smokeless tobacco: Never Used   Tobacco comment: 05/29/2014 "smoked socially; nothing since 1980"  Vaping Use   Vaping Use: Never used  Substance Use Topics   Alcohol use: Yes    Alcohol/week: 2.0 standard drinks    Types: 2 Shots of liquor per week    Comment: beer or two a week   Drug use: No    Home Medications Prior to Admission medications   Medication Sig Start Date End Date Taking? Authorizing Provider  calcium carbonate (TUMS - DOSED IN MG ELEMENTAL CALCIUM) 500 MG chewable tablet Chew 1 tablet by mouth daily.    [provider]  cetirizine (ZYRTEC) 10 MG tablet Take 1 tablet (10 mg total) by mouth daily. Patient not taking: Reported on 10/25/2019 01/30/19   [name], MD  ezetimibe (ZETIA) 10 MG tablet Take 1 tablet (10 mg total) by mouth daily. 11/27/19   Sueanne Margarita, MD  fluticasone (FLONASE) 50 MCG/ACT nasal spray SHAKE LIQUID AND USE 1 SPRAY IN EACH NOSTRIL DAILY Patient taking differently: Place 1 spray into both nostrils daily.  01/31/19   [name], MD  folic acid (FOLVITE) 1 MG tablet Take 1 tablet (1 mg total) by mouth daily. 12/03/19   Heilingoetter, Cassandra L, PA-C  levothyroxine (SYNTHROID) 125 MCG tablet Take 1 tablet (125 mcg total) by mouth daily. 12/11/19 12/10/20  Philemon Kingdom, MD  losartan (COZAAR) 50 MG tablet TAKE 1 TABLET BY MOUTH DAILY 12/11/19   Sueanne Margarita, MD  metoprolol tartrate (LOPRESSOR) 25 MG tablet TAKE 1 TABLET BY MOUTH TWICE DAILY 10/25/19   [name], MD  Multiple Vitamin (MULTIVITAMIN WITH MINERALS) TABS tablet Take 1 tablet by mouth daily.    [provider]  omeprazole (PRILOSEC) 40 MG capsule Take 1 capsule (40 mg total) by mouth 2 (two) times daily. 11/05/19   Tanner, Lyndon Code., PA-C  ondansetron (ZOFRAN ODT) 8 MG disintegrating tablet Take 1 tablet (8 mg total) by mouth every 8 (eight) hours as  needed for nausea or vomiting. 12/16/19   Dorie Rank, MD  oxyCODONE (ROXICODONE) 5 MG immediate release tablet Take 1 tablet (5 mg total) by mouth every 6 (six) hours as needed for severe pain. 12/03/19   Heilingoetter, Cassandra L, PA-C  simvastatin (ZOCOR) 40 MG tablet TAKE 1 TABLET(40 MG) BY MOUTH DAILY AT 6 PM Patient taking differently: Take 40 mg by mouth daily at 6 PM.  01/30/19   [name], MD  STOOL SOFTENER 100 MG capsule Take 100 mg by mouth daily as needed. 11/20/19   [provider]  vitamin B-12 (CYANOCOBALAMIN) 50 MCG tablet Take 50 mcg by mouth daily.    [provider]    Allergies    Lisinopril  Review of Systems   Review of Systems  All other systems reviewed and are negative.   Physical Exam Updated Vital Signs BP (!) 130/72 (BP Location: Right Arm)    Pulse 92    Temp 99.4 F (37.4 C) (Oral)    Resp 17    Ht 1.702 m (5\' 7" )    Wt 85.3 kg    SpO2 100%    BMI 29.44 kg/m   Physical Exam Vitals and nursing note reviewed.  Constitutional:      General: He is not in acute distress.    Appearance: He is well-developed.  HENT:     Head: Normocephalic and atraumatic.     Right Ear: External ear normal.     Left Ear: External ear normal.  Eyes:     General: No scleral icterus.       Right eye: No discharge.        Left eye: No discharge.     Conjunctiva/sclera: Conjunctivae normal.  Neck:     Trachea: No tracheal deviation.  Cardiovascular:     Rate and Rhythm: Normal rate and regular rhythm.  Pulmonary:     Effort: Pulmonary effort is normal. No respiratory distress.     Breath sounds: Normal breath sounds. No stridor. No wheezing or rales.  Abdominal:     General: Bowel sounds are normal. There is no distension.     Palpations: Abdomen is soft.     Tenderness: There is no abdominal tenderness. There is no guarding or rebound.  Musculoskeletal:        General: No tenderness.     Cervical back: Neck supple.  Skin:    General: Skin is  warm and dry.     Findings: No rash.  Neurological:     Mental Status: He is alert and oriented to person, place, and time.     Cranial Nerves: No cranial nerve deficit (No facial droop, extraocular movements intact, tongue midline ).     Sensory: No sensory deficit.     Motor: No tremor, abnormal muscle tone or seizure activity.     Coordination: Coordination normal.     Comments: No pronator drift bilateral upper extrem, able to hold both legs off bed for 5 seconds, sensation intact in all extremities, no visual field cuts, no left or right sided neglect, normal finger-nose exam bilaterally, no nystagmus noted      ED Results / Procedures / Treatments   Labs (all labs ordered are listed, but only abnormal results are displayed) Labs Reviewed  BASIC METABOLIC PANEL - Abnormal; Notable for the following components:      Result Value   Sodium 134 (*)    CO2 21 (*)    Glucose, Bld 113 (*)    Calcium 8.6 (*)    All other components within normal limits  CBC - Abnormal; Notable for the following components:   WBC 13.9 (*)    Hemoglobin 11.5 (*)    HCT 37.3 (*)    Platelets 476 (*)    All other components within normal limits  CBG MONITORING, ED    EKG None  Radiology No results found.  Procedures Procedures (including critical care time)  Medications Ordered in ED Medications  sodium chloride 0.9 % bolus 1,000 mL (1,000 mLs Intravenous New Bag/Given 12/16/19 1605)    Followed by  0.9 %  sodium chloride infusion (has no administration in time range)  sodium chloride flush (NS) 0.9 % injection 3 mL (3 mLs Intravenous Given 12/16/19 1611)  ondansetron (ZOFRAN) injection 4 mg (4 mg Intravenous Given 12/16/19 1604)  calcium chloride 1 g in sodium chloride 0.9 % 100 mL IVPB (1 g Intravenous New Bag/Given 12/16/19 1611)    ED Course  I have reviewed the triage vital signs and the nursing notes.  Pertinent labs & imaging results that were available during my care of the patient  were reviewed by me and considered in my medical decision making (see chart for details).  Clinical Course as of Dec 15 1856  Nancy Fetter Dec 16, 2019  1540 Labs reviewed.  Calcium level is decreased compared to previous.   [JK]  1541 Leukocytosis noted.  Anemia stable   [JK]    Clinical Course User Index [JK] Dorie Rank, MD   MDM Rules/Calculators/A&P                          Patient presented to the ED for evaluation of facial twitching and weakness after recent chemotherapy.  In the ED the patient's physical exam was reassuring.  No facial twitching was noted.  No findings to suggest seizure activity.  Patient's neurologic exam was normal.  Laboratory test did show mild hypocalcemia.  It is possible that could have been contributing to some of his symptoms.  Patient was given IV fluids.  He was also given a dose of calcium.  Patient was monitored in the ED and is feeling much better now.  He feels ready for discharge. Final Clinical Impression(s) / ED Diagnoses Final diagnoses:  Hypocalcemia    Rx / DC Orders ED Discharge Orders         Ordered    ondansetron (ZOFRAN ODT) 8 MG disintegrating tablet  Every 8 hours PRN     Discontinue  Reprint     12/16/19 1858           Dorie Rank, MD 12/16/19 1859

## 2019-12-16 NOTE — Discharge Instructions (Signed)
Continue your current medications.  Follow-up with your primary care doctor and oncologist as planned

## 2019-12-16 NOTE — ED Notes (Signed)
Patient verbalizes understanding of discharge instructions. Opportunity for questioning and answers were provided. Armband removed by staff, pt discharged from ED to home 

## 2019-12-16 NOTE — ED Triage Notes (Signed)
Reports intermittent L sided facial twitching that started Thursday night.  Also reports generalized weakness.  Pt had chemo on Thursday.

## 2019-12-17 ENCOUNTER — Telehealth: Payer: Self-pay | Admitting: *Deleted

## 2019-12-17 NOTE — Telephone Encounter (Signed)
After hours notification received, patient states he was not feeling well.  Had liver biopsy on Tuesday and he had a tembrolizumab injection on Tuesday.  He has been feeling ill since Friday.  He had an infusion on Thursday.    He has a nervous twitch in his face that is uncontrollable.  He needs to know if he should be concerned. Denies having a fever.   Patient was instructed to go to ED which he did and was found to be hypocalcemic.  Calcium replaced.  Attempted to call patient to check in on him after discharge from ED. Left message pending call back.  FYI to MD.

## 2019-12-18 LAB — SURGICAL PATHOLOGY

## 2019-12-18 NOTE — Progress Notes (Signed)
[facility] OFFICE PROGRESS NOTE  Carl Saint, MD [address] [place] [address]  DIAGNOSIS: 1) Metastatic non-small cell lung cancer, likely adenocarcinoma initially diagnosed as stage IIIA (T2b, N2, M0). Presented with right upper lobe lung mass in addition to right hilar and mediastinal lymphadenopathy diagnosed in September 2020. Showed disease progression with metastatic disease with Malignancy recurrence in the right suprahilar region and Two hypermetabolic left eccentric mesenteric masses along the small bowel mesentery, along with 3 new hypermetabolic lesions in the liver, compatible with malignancy in June 2021.  2) history of prostate adenocarcinoma status post radiotherapy under the care of Dr. Kathrynn Running. 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  Molecular Biomarkers: [id number] Everolimus, Temsirolimus Yes 4.9% [id number] None Yes 4.7% [id number] None Yes 0.2% [id number]* None No 5.2% [id number] None No 5.0% [id number] None (VUS) No (VUS) 4.4% [id number] None (VUS) No (VUS) 3.5% MSI-HighNOT DETECTED  PRIOR THERAPY: 1) Concurrent chemoradiation with weekly carboplatin for AUC of 2 and paclitaxel 45 mg/M2. First dose March 05, 2019. Status post 7 cycles. Last dose was given April 16, 2019 with partial response. 2)Total thyroidectomy on June 25, 2019 for follicular neoplasm of the thyroid under the care of Dr. Gerrit Friends 3) Consolidation immunotherapy with immunotherapy with Imfinzi 1500 mg IV every 4 weeks. Last dose 10/11/19.   CURRENT THERAPY: Systemic chemotherapy with Carboplatin for an AUC of 5, Alimta 500 mg/m2, and Keytruda 200 mg IV every 3 weeks. First dose expected on 12/13/2019.   INTERVAL HISTORY: Carl Harrell 74 y.o. male returns to the clinic today for a follow-up visit accompanied by his wife. The patient is feeling  fatigued today. The patient recently had a CT scan of the chest, abdomen, and pelvis which noted 3 hypermetabolic lesions in the liver as well as a 2 cm left eccentric mesenteric mass along the small bowel mesentery. The patient had a colonoscopy and upper endoscopy which was unremarkable.  He has been experiencing intermittent epigastric pain which he characterizes as a bloated sensation.  He was given oxycodone for his pain but has run out and has not been taking anything for pain recently. He also notes associated weight loss.  He denies any nausea or vomiting.  He also had evidence of disease recurrence in the suprahilar region.  Therefore, the patient's treatment was recently switched to chemotherapy and immunotherapy with carboplatin, Alimta, and Keytruda.  He had his first cycle of treatment last week and he tolerated it fair except for anemia and fatigue.   He also had a ultrasound-guided biopsy of one of the liver lesions which was consistent with adenocarcinoma, although non-specific, likely related to his lung cancer in the absence of any concerning findings on his recent colonoscopy and endoscopy.   He denies any fever, chills, or night sweats.  The patient continues to lose weight.  The patient states that he experiences early satiety which may be related to the large mass and the abdomen.  He also notes taste alterations without any evidence of thrush. He follows with endocrinology due to his history of a total thyroidectomy for the follicular neoplasm in the thyroid.  He recently had his Synthroid dose increased but has not started this dose of the medication yet.  The patient denies any chest pain, cough, or hemoptysis but endorses worsening shortness of breath which may be related to his worsening anemia.  The patient was experiencing facial twitching last week and went to  the emergency room.  He was found to have mild hypocalcemia and received calcium replacement and his symptoms have resolved  at this time.  He denies any headache or visual changes.  He denies any rashes or skin changes. He is here today for evaluation 1 week follow-up visit after completing his first cycle of his new treatment.   MEDICAL HISTORY: Past Medical History:  Diagnosis Date  . ALLERGIC RHINITIS 01/30/2007  . Arthritis    "hands" (05/29/2014)  . Cancer (HCC)    lung, prostate  . Constipation   . Coronary artery disease    LHC (05/29/14): pLAD 50-70, pRCA 99 (L>R collats), EF 55% >> PCI: 3.5 x 28 mm Xience Alpine DES to [employer]  . Cough secondary to angiotensin converting enzyme inhibitor (ACE-I) 05/28/2013  . ED (erectile dysfunction)   . GERD 09/26/2007  . HYPERLIPIDEMIA 09/26/2007  . Hypertension   . Lung cancer (HCC) dx'd 03/2018  . PLANTAR FASCIITIS, BILATERAL 07/08/2009  . Pre-diabetes   . Prostate CA (HCC) dx'd 2017 or 2018   xrt  . Unspecified hearing loss 06/16/2009   wears aides both ears    ALLERGIES:  is allergic to lisinopril.  MEDICATIONS:  Current Outpatient Medications  Medication Sig Dispense Refill  . calcium carbonate (TUMS - DOSED IN MG ELEMENTAL CALCIUM) 500 MG chewable tablet Chew 1 tablet by mouth daily.    Marland Kitchen ezetimibe (ZETIA) 10 MG tablet Take 1 tablet (10 mg total) by mouth daily. 90 tablet 2  . fluticasone (FLONASE) 50 MCG/ACT nasal spray SHAKE LIQUID AND USE 1 SPRAY IN EACH NOSTRIL DAILY (Patient taking differently: Place 1 spray into both nostrils daily. ) 48 g 0  . folic acid (FOLVITE) 1 MG tablet Take 1 tablet (1 mg total) by mouth daily. 30 tablet 2  . levothyroxine (SYNTHROID) 125 MCG tablet Take 1 tablet (125 mcg total) by mouth daily. 45 tablet 3  . losartan (COZAAR) 50 MG tablet TAKE 1 TABLET BY MOUTH DAILY 90 tablet 0  . metoprolol tartrate (LOPRESSOR) 25 MG tablet TAKE 1 TABLET BY MOUTH TWICE DAILY 180 tablet 0  . Multiple Vitamin (MULTIVITAMIN WITH MINERALS) TABS tablet Take 1 tablet by mouth daily.    Marland Kitchen omeprazole (PRILOSEC) 40 MG capsule Take 1 capsule (40 mg  total) by mouth 2 (two) times daily. 60 capsule 3  . ondansetron (ZOFRAN ODT) 8 MG disintegrating tablet Take 1 tablet (8 mg total) by mouth every 8 (eight) hours as needed for nausea or vomiting. 12 tablet 0  . oxyCODONE (ROXICODONE) 5 MG immediate release tablet Take 1 tablet (5 mg total) by mouth every 6 (six) hours as needed for severe pain. 30 tablet 0  . simvastatin (ZOCOR) 40 MG tablet TAKE 1 TABLET(40 MG) BY MOUTH DAILY AT 6 PM (Patient taking differently: Take 40 mg by mouth daily at 6 PM. ) 90 tablet 3  . STOOL SOFTENER 100 MG capsule Take 100 mg by mouth daily as needed.    . vitamin B-12 (CYANOCOBALAMIN) 50 MCG tablet Take 50 mcg by mouth daily.    . cetirizine (ZYRTEC) 10 MG tablet Take 1 tablet (10 mg total) by mouth daily. (Patient not taking: Reported on 10/25/2019) 30 tablet 11   No current facility-administered medications for this visit.    SURGICAL HISTORY:  Past Surgical History:  Procedure Laterality Date  . CARDIAC CATHETERIZATION  05/29/2014   Procedure: CORONARY STENT INTERVENTION;  Surgeon: Lesleigh Noe, MD;  Location: [facility] CATH LAB;  Service: Cardiovascular;;  Prox RCA  . COLONOSCOPY    . CORONARY ANGIOPLASTY WITH STENT PLACEMENT  05/29/2014   "1"  . KNEE ARTHROSCOPY Right ~ 2005  . LEFT HEART CATHETERIZATION WITH CORONARY ANGIOGRAM N/A 05/29/2014   Procedure: LEFT HEART CATHETERIZATION WITH CORONARY ANGIOGRAM;  Surgeon: Lesleigh Noe, MD;  Location: [facility] CATH LAB;  Service: Cardiovascular;  Laterality: N/A;  . THYROIDECTOMY N/A 06/25/2019   Procedure: TOTAL THYROIDECTOMY;  Surgeon: Darnell Level, MD;  Location: [facility] ORS;  Service: General;  Laterality: N/A;  . VIDEO BRONCHOSCOPY WITH ENDOBRONCHIAL ULTRASOUND N/A 02/20/2019   Procedure: VIDEO BRONCHOSCOPY WITH ENDOBRONCHIAL ULTRASOUND;  Surgeon: Josephine Igo, DO;  Location: [facility] OR;  Service: Thoracic;  Laterality: N/A;    REVIEW OF SYSTEMS:   Review of Systems  Constitutional: Positive for fatigue, appetite change,  and weight loss.  Negative for chills and fever. HENT: Positive for taste alterations.  Negative for mouth sores, nosebleeds, sore throat and trouble swallowing.   Eyes: Negative for eye problems and icterus.  Respiratory: Positive for shortness of breath with exertion.  Negative for cough, hemoptysis,  and wheezing.   Cardiovascular: Negative for chest pain and leg swelling.  Gastrointestinal: Positive for intermittent abdominal pain and occasional mild nausea.  Negative for constipation, diarrhea, and vomiting.  Genitourinary: Negative for bladder incontinence, difficulty urinating, dysuria, frequency and hematuria.   Musculoskeletal: Negative for back pain, gait problem, neck pain and neck stiffness.  Skin: Negative for itching and rash.  Neurological: Negative for dizziness, extremity weakness, gait problem, headaches, light-headedness and seizures.  Hematological: Negative for adenopathy. Does not bruise/bleed easily.  Psychiatric/Behavioral: Negative for confusion, depression and sleep disturbance. The patient is not nervous/anxious.     PHYSICAL EXAMINATION:  Blood pressure 116/70, pulse 82, temperature (!) 97.5 F (36.4 C), temperature source Oral, resp. rate 18, height 5\' 7"  (1.702 m), weight 184 lb 11.2 oz (83.8 kg), SpO2 100 %.  ECOG PERFORMANCE STATUS: 1 - Symptomatic but completely ambulatory  Physical Exam  Constitutional: Oriented to person, place, and time and well-developed, well-nourished, and in no distress.  HENT:  Head: Normocephalic and atraumatic.  Mouth/Throat: Oropharynx is clear and moist. No oropharyngeal exudate. No evidence of thrush. Eyes: Conjunctivae are normal. Right eye exhibits no discharge. Left eye exhibits no discharge. No scleral icterus.  Neck: Normal range of motion. Neck supple.  Cardiovascular: Normal rate, regular rhythm, normal heart sounds and intact distal pulses.   Pulmonary/Chest: Effort normal and breath sounds normal. No respiratory  distress. No wheezes. No rales.  Abdominal: Soft. Bowel sounds are normal. Exhibits no distension and no mass. There is no tenderness.  Musculoskeletal: Normal range of motion. Exhibits no edema.  Lymphadenopathy:    No cervical adenopathy.  Neurological: Alert and oriented to person, place, and time. Exhibits normal muscle tone. Gait normal. Coordination normal.  Skin: Skin is warm and dry. No rash noted. Not diaphoretic. No erythema. No pallor.  Psychiatric: Mood, memory and judgment normal.  Vitals reviewed.  LABORATORY DATA: Lab Results  Component Value Date   WBC 4.1 12/19/2019   HGB 9.3 (L) 12/19/2019   HCT 29.4 (L) 12/19/2019   MCV 84.0 12/19/2019   PLT 278 12/19/2019      Chemistry      Component Value Date/Time   NA 137 12/19/2019 0839   NA 141 08/05/2016 0826   K 4.6 12/19/2019 0839   CL 103 12/19/2019 0839   CO2 24 12/19/2019 0839   BUN 16 12/19/2019 0839   BUN 14 08/05/2016  1660   CREATININE 1.08 12/19/2019 0839      Component Value Date/Time   CALCIUM 8.9 12/19/2019 0839   ALKPHOS 233 (H) 12/19/2019 0839   AST 49 (H) 12/19/2019 0839   ALT 56 (H) 12/19/2019 0839   BILITOT 0.6 12/19/2019 0839       RADIOGRAPHIC STUDIES:  [place] CORE BIOPSY (LIVER)  Result Date: 12/12/2019 CLINICAL DATA:  History of lung carcinoma, thyroid carcinoma, prostate carcinoma. Mesenteric mass, right suprahilar recurrence, liver lesions. Biopsy requested EXAM: ULTRASOUND GUIDED CORE BIOPSY OF LIVER LESION MEDICATIONS: Intravenous Fentanyl 36mg and Versed '1mg'$  were administered as conscious sedation during continuous monitoring of the patient's level of consciousness and physiological / cardiorespiratory status by the radiology RN, with a total moderate sedation time of 10 minutes. PROCEDURE: The procedure, risks, benefits, and alternatives were explained to the patient. Questions regarding the procedure were encouraged and answered. The patient understands and consents to the procedure.  Survey ultrasound of the liver was performed. A representative lesion was localized and appropriate skin entry site determined. The operative field was prepped with chlorhexidine in a sterile fashion, and a sterile drape was applied covering the operative field. A sterile gown and sterile gloves were used for the procedure. Local anesthesia was provided with 1% Lidocaine. Under real-time ultrasound guidance, a 17 gauge trocar needle was advanced to the margin of the lesion. Once needle tip position was confirmed, coaxial 18-gauge core biopsy samples were obtained, submitted in formalin to surgical pathology. The guide needle was removed. Postprocedure scans show no hemorrhage or other apparent complication. The patient tolerated the procedure well. COMPLICATIONS: None. FINDINGS: Multiple hypoechoic liver lesions were localized. Core biopsies of a representative lesion were obtained as above. IMPRESSION: 1. Technically successful ultrasound-guided core biopsy, liver lesion. Electronically Signed   By: DLucrezia EuropeM.D.   On: 12/12/2019 09:23     ASSESSMENT/PLAN:  This is a very pleasant 74year old Caucasian male diagnosed with metastatic non-small cell lung cancer, likely adenocarcinoma initially diagnosed as stage IIIA (T2b, N2, M0). Presented with right upper lobe lung mass in addition to right hilar and mediastinal lymphadenopathy diagnosed in September 2020. Showed disease progression with metastatic disease recurrence in the right suprahilar region and two hypermetabolic left eccentric mesenteric masses along the small bowel mesentery, along with 3 new hypermetabolic lesions in the liver, compatible with malignancy in June 2021.  The patient does not have any actionable mutations.  The patient completed a course of concurrent chemoradiation with weekly carboplatin for an AUC of 2 and paclitaxel 45 mg/m.  He status post 7 cycles.  He tolerated treatment well except for fatigue and a dry cough.  He had a  partial response to treatment.  The patient then underwent consolidation immunotherapy with Imfinzi 1500 mg IV every 4 weeks.  The patient is status post 6 cycles.  He recently showed evidence of disease progression with new local disease recurrence as well as metastatic disease to the liver and a suspicious mass along the small bowel mesentery.  He was recently referred to gastroenterology to further evaluate this lesion. No masses were appreciated on colonoscopy/endoscopy.  The patient recently had a biopsy of the liver lesion which was consistent with adenocarcinoma, Dr. MJulien Nordmannbelieves this to be related to his lung cancer.   The patient is currently undergoing systemic chemotherapy with carboplatin for an AUC of 5, Alimta 500 mg per metered squared, Keytruda 200 mg IV every 3 weeks.  He is status post his first cycle and  he tolerated it fair except for fatigue and anemia.  The patient was seen with Dr. Julien Nordmann.  Labs are reviewed.  The patient's hemoglobin is 9.3 today.  We would not recommend a blood transfusion at this point in time unless his hemoglobin is less than 8.  I will arrange for the patient to have a sample to blood bank performed on weekly labs for the next 2 weeks in case he requires a blood transfusion.  Dr. Earlie Server also strongly encourage the patient to start taking a multivitamin and iron supplement.  We will see him back for follow-up visit in 2 weeks for evaluation before starting cycle #2.  He will continue to follow with his endocrinologist for his hypothyroidism secondary to his total thyroidectomy.  I strongly encouraged to pick up the new dose of his Synthroid and to start taking this as prescribed.   Regarding the patient's weight loss/early satiety, this is likely secondary to the masses in his abdomen.  The patient was encouraged to eat small frequent meals.  He was also encouraged to use Biotene as well as salt water rinses for the taste alterations.  He was also  strongly encouraged to use lemon drops/lemon heads to help with the taste alterations.  I sent a refill of the patient's oxycodone for his cancer related pain; however, the patient was instructed to only take this medication every 6 hours as needed.  He was encouraged to use Tylenol preferably if needed.   The patient was also strongly encouraged to take his folic acid as prescribed.  He is unsure if he started taking this yet.  The patient was advised to call immediately if he has any concerning symptoms in the interval. The patient voices understanding of current disease status and treatment options and is in agreement with the current care plan. All questions were answered. The patient knows to call the clinic with any problems, questions or concerns. We can certainly see the patient much sooner if necessary    Orders Placed This Encounter  Procedures  . Sample to Blood Bank    Standing Status:   Standing    Number of Occurrences:   2    Standing Expiration Date:   12/18/2020     Tobe Sos Dore Oquin, PA-C 12/19/19  ADDENDUM: Hematology/Oncology Attending: I had a face-to-face encounter with the patient today. I recommended his care plan. This is a very pleasant 74 years old white male with metastatic non-small cell lung cancer, adenocarcinoma that was initially diagnosed as a stage IIIa status post a course of concurrent chemoradiation followed by 6 cycles of consolidation treatment with immunotherapy with Imfinzi discontinued secondary to disease progression and metastasis to the liver as well as the abdomen. The patient underwent ultrasound-guided liver biopsy and the final pathology was consistent with adenocarcinoma of unclear primary but likely related to his lung cancer since his gastrointestinal work-up was negative. The patient is currently undergoing systemic chemotherapy with carboplatin, Alimta and Keytruda status post 1 cycle. He tolerated the 1st week of his treatment  well except for fatigue secondary to the chemotherapy-induced anemia. I recommended for the patient to continue his treatment with systemic chemotherapy as planned. I discussed with the patient and his wife his current poor prognosis and they understand that this treatment is palliative in nature. We will see him back for follow-up visit in 2 weeks for evaluation before starting cycle #2. He was encouraged to increase his iron rich diet. The patient was advised to call immediately  if he has any other concerning symptoms in the interval.  Disclaimer: This note was dictated with voice recognition software. Similar sounding words can inadvertently be transcribed and may be missed upon review. Eilleen Kempf, MD 12/19/19

## 2019-12-19 ENCOUNTER — Encounter: Payer: Self-pay | Admitting: Physician Assistant

## 2019-12-19 ENCOUNTER — Other Ambulatory Visit: Payer: Medicare Other

## 2019-12-19 ENCOUNTER — Inpatient Hospital Stay (HOSPITAL_BASED_OUTPATIENT_CLINIC_OR_DEPARTMENT_OTHER): Payer: Medicare Other | Admitting: Physician Assistant

## 2019-12-19 ENCOUNTER — Inpatient Hospital Stay: Payer: Medicare Other

## 2019-12-19 ENCOUNTER — Other Ambulatory Visit: Payer: Self-pay

## 2019-12-19 VITALS — BP 116/70 | HR 82 | Temp 97.5°F | Resp 18 | Ht 67.0 in | Wt 184.7 lb

## 2019-12-19 DIAGNOSIS — C787 Secondary malignant neoplasm of liver and intrahepatic bile duct: Secondary | ICD-10-CM | POA: Diagnosis not present

## 2019-12-19 DIAGNOSIS — C3491 Malignant neoplasm of unspecified part of right bronchus or lung: Secondary | ICD-10-CM

## 2019-12-19 DIAGNOSIS — K219 Gastro-esophageal reflux disease without esophagitis: Secondary | ICD-10-CM | POA: Diagnosis not present

## 2019-12-19 DIAGNOSIS — R5383 Other fatigue: Secondary | ICD-10-CM

## 2019-12-19 DIAGNOSIS — R634 Abnormal weight loss: Secondary | ICD-10-CM

## 2019-12-19 DIAGNOSIS — D6481 Anemia due to antineoplastic chemotherapy: Secondary | ICD-10-CM | POA: Diagnosis not present

## 2019-12-19 DIAGNOSIS — C3411 Malignant neoplasm of upper lobe, right bronchus or lung: Secondary | ICD-10-CM | POA: Diagnosis not present

## 2019-12-19 DIAGNOSIS — Z923 Personal history of irradiation: Secondary | ICD-10-CM | POA: Diagnosis not present

## 2019-12-19 DIAGNOSIS — C73 Malignant neoplasm of thyroid gland: Secondary | ICD-10-CM | POA: Diagnosis not present

## 2019-12-19 DIAGNOSIS — Z5112 Encounter for antineoplastic immunotherapy: Secondary | ICD-10-CM

## 2019-12-19 DIAGNOSIS — I2510 Atherosclerotic heart disease of native coronary artery without angina pectoris: Secondary | ICD-10-CM | POA: Diagnosis not present

## 2019-12-19 DIAGNOSIS — I10 Essential (primary) hypertension: Secondary | ICD-10-CM | POA: Diagnosis not present

## 2019-12-19 DIAGNOSIS — Z85850 Personal history of malignant neoplasm of thyroid: Secondary | ICD-10-CM | POA: Diagnosis not present

## 2019-12-19 DIAGNOSIS — G893 Neoplasm related pain (acute) (chronic): Secondary | ICD-10-CM | POA: Diagnosis not present

## 2019-12-19 DIAGNOSIS — E785 Hyperlipidemia, unspecified: Secondary | ICD-10-CM | POA: Diagnosis not present

## 2019-12-19 DIAGNOSIS — Z79899 Other long term (current) drug therapy: Secondary | ICD-10-CM | POA: Diagnosis not present

## 2019-12-19 DIAGNOSIS — Z5111 Encounter for antineoplastic chemotherapy: Secondary | ICD-10-CM | POA: Diagnosis not present

## 2019-12-19 DIAGNOSIS — C786 Secondary malignant neoplasm of retroperitoneum and peritoneum: Secondary | ICD-10-CM | POA: Diagnosis not present

## 2019-12-19 LAB — CMP (CANCER CENTER ONLY)
ALT: 56 U/L — ABNORMAL HIGH (ref 0–44)
AST: 49 U/L — ABNORMAL HIGH (ref 15–41)
Albumin: 2.7 g/dL — ABNORMAL LOW (ref 3.5–5.0)
Alkaline Phosphatase: 233 U/L — ABNORMAL HIGH (ref 38–126)
Anion gap: 10 (ref 5–15)
BUN: 16 mg/dL (ref 8–23)
CO2: 24 mmol/L (ref 22–32)
Calcium: 8.9 mg/dL (ref 8.9–10.3)
Chloride: 103 mmol/L (ref 98–111)
Creatinine: 1.08 mg/dL (ref 0.61–1.24)
GFR, Est AFR Am: >60 mL/min (ref >60)
GFR, Estimated: >60 mL/min (ref >60)
Glucose, Bld: 95 mg/dL (ref 70–99)
Potassium: 4.6 mmol/L (ref 3.5–5.1)
Sodium: 137 mmol/L (ref 135–145)
Total Bilirubin: 0.6 mg/dL (ref 0.3–1.2)
Total Protein: 6.7 g/dL (ref 6.5–8.1)

## 2019-12-19 LAB — CBC WITH DIFFERENTIAL (CANCER CENTER ONLY)
Abs Immature Granulocytes: 0.02 10*3/uL (ref 0.00–0.07)
Basophils Absolute: 0 10*3/uL (ref 0.0–0.1)
Basophils Relative: 0 %
Eosinophils Absolute: 0.1 10*3/uL (ref 0.0–0.5)
Eosinophils Relative: 2 %
HCT: 29.4 % — ABNORMAL LOW (ref 39.0–52.0)
Hemoglobin: 9.3 g/dL — ABNORMAL LOW (ref 13.0–17.0)
Immature Granulocytes: 1 %
Lymphocytes Relative: 17 %
Lymphs Abs: 0.7 10*3/uL (ref 0.7–4.0)
MCH: 26.6 pg (ref 26.0–34.0)
MCHC: 31.6 g/dL (ref 30.0–36.0)
MCV: 84 fL (ref 80.0–100.0)
Monocytes Absolute: 0.4 10*3/uL (ref 0.1–1.0)
Monocytes Relative: 9 %
Neutro Abs: 3 10*3/uL (ref 1.7–7.7)
Neutrophils Relative %: 71 %
Platelet Count: 278 10*3/uL (ref 150–400)
RBC: 3.5 MIL/uL — ABNORMAL LOW (ref 4.22–5.81)
RDW: 14.5 % (ref 11.5–15.5)
WBC Count: 4.1 10*3/uL (ref 4.0–10.5)
nRBC: 0 % (ref 0.0–0.2)

## 2019-12-19 MED ORDER — OXYCODONE HCL 5 MG PO TABS: 5.0000 mg | ORAL_TABLET | Freq: Four times a day (QID) | ORAL | 0 refills | Status: DC | PRN

## 2019-12-21 ENCOUNTER — Telehealth: Payer: Self-pay | Admitting: Internal Medicine

## 2019-12-21 NOTE — Telephone Encounter (Signed)
Scheduled appointments per 7/28 lost. Left message on patient's voicemail with appointment dates and times.

## 2019-12-26 ENCOUNTER — Inpatient Hospital Stay: Payer: Medicare Other | Attending: Internal Medicine

## 2019-12-26 ENCOUNTER — Other Ambulatory Visit: Payer: Self-pay

## 2019-12-26 DIAGNOSIS — C787 Secondary malignant neoplasm of liver and intrahepatic bile duct: Secondary | ICD-10-CM | POA: Insufficient documentation

## 2019-12-26 DIAGNOSIS — I10 Essential (primary) hypertension: Secondary | ICD-10-CM | POA: Diagnosis not present

## 2019-12-26 DIAGNOSIS — Z87891 Personal history of nicotine dependence: Secondary | ICD-10-CM | POA: Diagnosis not present

## 2019-12-26 DIAGNOSIS — Z79899 Other long term (current) drug therapy: Secondary | ICD-10-CM | POA: Diagnosis not present

## 2019-12-26 DIAGNOSIS — C3411 Malignant neoplasm of upper lobe, right bronchus or lung: Secondary | ICD-10-CM | POA: Diagnosis not present

## 2019-12-26 DIAGNOSIS — Z9221 Personal history of antineoplastic chemotherapy: Secondary | ICD-10-CM | POA: Diagnosis not present

## 2019-12-26 DIAGNOSIS — Z5111 Encounter for antineoplastic chemotherapy: Secondary | ICD-10-CM | POA: Insufficient documentation

## 2019-12-26 DIAGNOSIS — Z923 Personal history of irradiation: Secondary | ICD-10-CM | POA: Diagnosis not present

## 2019-12-26 DIAGNOSIS — Z5112 Encounter for antineoplastic immunotherapy: Secondary | ICD-10-CM | POA: Diagnosis not present

## 2019-12-26 DIAGNOSIS — C786 Secondary malignant neoplasm of retroperitoneum and peritoneum: Secondary | ICD-10-CM | POA: Diagnosis not present

## 2019-12-26 DIAGNOSIS — C3491 Malignant neoplasm of unspecified part of right bronchus or lung: Secondary | ICD-10-CM

## 2019-12-26 DIAGNOSIS — C73 Malignant neoplasm of thyroid gland: Secondary | ICD-10-CM | POA: Insufficient documentation

## 2019-12-26 DIAGNOSIS — Z8546 Personal history of malignant neoplasm of prostate: Secondary | ICD-10-CM | POA: Insufficient documentation

## 2019-12-26 DIAGNOSIS — I2510 Atherosclerotic heart disease of native coronary artery without angina pectoris: Secondary | ICD-10-CM | POA: Insufficient documentation

## 2019-12-26 LAB — CBC WITH DIFFERENTIAL (CANCER CENTER ONLY)
Abs Immature Granulocytes: 0.02 10*3/uL (ref 0.00–0.07)
Basophils Absolute: 0 10*3/uL (ref 0.0–0.1)
Basophils Relative: 0 %
Eosinophils Absolute: 0 10*3/uL (ref 0.0–0.5)
Eosinophils Relative: 0 %
HCT: 30.1 % — ABNORMAL LOW (ref 39.0–52.0)
Hemoglobin: 9.2 g/dL — ABNORMAL LOW (ref 13.0–17.0)
Immature Granulocytes: 0 %
Lymphocytes Relative: 15 %
Lymphs Abs: 0.9 10*3/uL (ref 0.7–4.0)
MCH: 25.9 pg — ABNORMAL LOW (ref 26.0–34.0)
MCHC: 30.6 g/dL (ref 30.0–36.0)
MCV: 84.8 fL (ref 80.0–100.0)
Monocytes Absolute: 0.9 10*3/uL (ref 0.1–1.0)
Monocytes Relative: 14 %
Neutro Abs: 4.1 10*3/uL (ref 1.7–7.7)
Neutrophils Relative %: 71 %
Platelet Count: 291 10*3/uL (ref 150–400)
RBC: 3.55 MIL/uL — ABNORMAL LOW (ref 4.22–5.81)
RDW: 15 % (ref 11.5–15.5)
WBC Count: 5.9 10*3/uL (ref 4.0–10.5)
nRBC: 0 % (ref 0.0–0.2)

## 2019-12-26 LAB — CMP (CANCER CENTER ONLY)
ALT: 46 U/L — ABNORMAL HIGH (ref 0–44)
AST: 30 U/L (ref 15–41)
Albumin: 2.8 g/dL — ABNORMAL LOW (ref 3.5–5.0)
Alkaline Phosphatase: 275 U/L — ABNORMAL HIGH (ref 38–126)
Anion gap: 9 (ref 5–15)
BUN: 14 mg/dL (ref 8–23)
CO2: 24 mmol/L (ref 22–32)
Calcium: 9 mg/dL (ref 8.9–10.3)
Chloride: 108 mmol/L (ref 98–111)
Creatinine: 1.12 mg/dL (ref 0.61–1.24)
GFR, Est AFR Am: >60 mL/min (ref >60)
GFR, Estimated: >60 mL/min (ref >60)
Glucose, Bld: 109 mg/dL — ABNORMAL HIGH (ref 70–99)
Potassium: 4.5 mmol/L (ref 3.5–5.1)
Sodium: 141 mmol/L (ref 135–145)
Total Bilirubin: 0.3 mg/dL (ref 0.3–1.2)
Total Protein: 6.6 g/dL (ref 6.5–8.1)

## 2019-12-26 LAB — SAMPLE TO BLOOD BANK

## 2019-12-27 ENCOUNTER — Telehealth: Payer: Self-pay

## 2019-12-27 NOTE — Telephone Encounter (Signed)
He called and requested appt with Dr. Alvy Bimler for a 2nd opinion. He is currently seeing Dr. Julien Nordmann.

## 2019-12-28 NOTE — Telephone Encounter (Signed)
I can see him 8/17 at 1 pm, 1 hour

## 2019-12-28 NOTE — Telephone Encounter (Signed)
Called and given below message. Appt scheduled. He is aware of time, ask to arrive 15 mins early.

## 2020-01-02 ENCOUNTER — Inpatient Hospital Stay: Payer: Medicare Other

## 2020-01-02 ENCOUNTER — Encounter: Payer: Self-pay | Admitting: Internal Medicine

## 2020-01-02 ENCOUNTER — Inpatient Hospital Stay: Payer: Medicare Other | Admitting: Nutrition

## 2020-01-02 ENCOUNTER — Inpatient Hospital Stay (HOSPITAL_BASED_OUTPATIENT_CLINIC_OR_DEPARTMENT_OTHER): Payer: Medicare Other | Admitting: Internal Medicine

## 2020-01-02 ENCOUNTER — Other Ambulatory Visit: Payer: Self-pay

## 2020-01-02 VITALS — BP 114/90 | HR 107 | Temp 98.9°F | Resp 18 | Ht 67.0 in | Wt 184.6 lb

## 2020-01-02 VITALS — HR 99

## 2020-01-02 DIAGNOSIS — C787 Secondary malignant neoplasm of liver and intrahepatic bile duct: Secondary | ICD-10-CM | POA: Diagnosis not present

## 2020-01-02 DIAGNOSIS — C786 Secondary malignant neoplasm of retroperitoneum and peritoneum: Secondary | ICD-10-CM | POA: Diagnosis not present

## 2020-01-02 DIAGNOSIS — Z9221 Personal history of antineoplastic chemotherapy: Secondary | ICD-10-CM | POA: Diagnosis not present

## 2020-01-02 DIAGNOSIS — C3491 Malignant neoplasm of unspecified part of right bronchus or lung: Secondary | ICD-10-CM

## 2020-01-02 DIAGNOSIS — G893 Neoplasm related pain (acute) (chronic): Secondary | ICD-10-CM

## 2020-01-02 DIAGNOSIS — Z79899 Other long term (current) drug therapy: Secondary | ICD-10-CM | POA: Diagnosis not present

## 2020-01-02 DIAGNOSIS — Z5111 Encounter for antineoplastic chemotherapy: Secondary | ICD-10-CM | POA: Diagnosis not present

## 2020-01-02 DIAGNOSIS — Z5112 Encounter for antineoplastic immunotherapy: Secondary | ICD-10-CM

## 2020-01-02 DIAGNOSIS — E1159 Type 2 diabetes mellitus with other circulatory complications: Secondary | ICD-10-CM

## 2020-01-02 DIAGNOSIS — I10 Essential (primary) hypertension: Secondary | ICD-10-CM | POA: Diagnosis not present

## 2020-01-02 DIAGNOSIS — Z87891 Personal history of nicotine dependence: Secondary | ICD-10-CM | POA: Diagnosis not present

## 2020-01-02 DIAGNOSIS — Z923 Personal history of irradiation: Secondary | ICD-10-CM | POA: Diagnosis not present

## 2020-01-02 DIAGNOSIS — C73 Malignant neoplasm of thyroid gland: Secondary | ICD-10-CM

## 2020-01-02 DIAGNOSIS — I2510 Atherosclerotic heart disease of native coronary artery without angina pectoris: Secondary | ICD-10-CM | POA: Diagnosis not present

## 2020-01-02 DIAGNOSIS — C3411 Malignant neoplasm of upper lobe, right bronchus or lung: Secondary | ICD-10-CM | POA: Diagnosis not present

## 2020-01-02 DIAGNOSIS — C61 Malignant neoplasm of prostate: Secondary | ICD-10-CM | POA: Diagnosis not present

## 2020-01-02 LAB — CBC WITH DIFFERENTIAL (CANCER CENTER ONLY)
Abs Immature Granulocytes: 0.1 10*3/uL — ABNORMAL HIGH (ref 0.00–0.07)
Basophils Absolute: 0 10*3/uL (ref 0.0–0.1)
Basophils Relative: 0 %
Eosinophils Absolute: 0 10*3/uL (ref 0.0–0.5)
Eosinophils Relative: 0 %
HCT: 32.9 % — ABNORMAL LOW (ref 39.0–52.0)
Hemoglobin: 10.1 g/dL — ABNORMAL LOW (ref 13.0–17.0)
Immature Granulocytes: 1 %
Lymphocytes Relative: 10 %
Lymphs Abs: 0.9 10*3/uL (ref 0.7–4.0)
MCH: 25.8 pg — ABNORMAL LOW (ref 26.0–34.0)
MCHC: 30.7 g/dL (ref 30.0–36.0)
MCV: 84.1 fL (ref 80.0–100.0)
Monocytes Absolute: 1.3 10*3/uL — ABNORMAL HIGH (ref 0.1–1.0)
Monocytes Relative: 13 %
Neutro Abs: 7.3 10*3/uL (ref 1.7–7.7)
Neutrophils Relative %: 76 %
Platelet Count: 467 10*3/uL — ABNORMAL HIGH (ref 150–400)
RBC: 3.91 MIL/uL — ABNORMAL LOW (ref 4.22–5.81)
RDW: 16.3 % — ABNORMAL HIGH (ref 11.5–15.5)
WBC Count: 9.6 10*3/uL (ref 4.0–10.5)
nRBC: 0 % (ref 0.0–0.2)

## 2020-01-02 LAB — CMP (CANCER CENTER ONLY)
ALT: 26 U/L (ref 0–44)
AST: 31 U/L (ref 15–41)
Albumin: 3 g/dL — ABNORMAL LOW (ref 3.5–5.0)
Alkaline Phosphatase: 253 U/L — ABNORMAL HIGH (ref 38–126)
Anion gap: 11 (ref 5–15)
BUN: 9 mg/dL (ref 8–23)
CO2: 24 mmol/L (ref 22–32)
Calcium: 9.2 mg/dL (ref 8.9–10.3)
Chloride: 104 mmol/L (ref 98–111)
Creatinine: 1 mg/dL (ref 0.61–1.24)
GFR, Est AFR Am: >60 mL/min (ref >60)
GFR, Estimated: >60 mL/min (ref >60)
Glucose, Bld: 104 mg/dL — ABNORMAL HIGH (ref 70–99)
Potassium: 4.5 mmol/L (ref 3.5–5.1)
Sodium: 139 mmol/L (ref 135–145)
Total Bilirubin: 0.4 mg/dL (ref 0.3–1.2)
Total Protein: 6.9 g/dL (ref 6.5–8.1)

## 2020-01-02 LAB — SAMPLE TO BLOOD BANK

## 2020-01-02 LAB — TSH: TSH: 1.249 u[IU]/mL (ref 0.320–4.118)

## 2020-01-02 MED ORDER — SODIUM CHLORIDE 0.9 % IV SOLN
10.0000 mg | Freq: Once | INTRAVENOUS | Status: AC
  Administered 2020-01-02: 10 mg via INTRAVENOUS
  Filled 2020-01-02: qty 10

## 2020-01-02 MED ORDER — PALONOSETRON HCL INJECTION 0.25 MG/5ML
0.2500 mg | Freq: Once | INTRAVENOUS | Status: AC
  Administered 2020-01-02: 0.25 mg via INTRAVENOUS

## 2020-01-02 MED ORDER — SODIUM CHLORIDE 0.9 % IV SOLN
200.0000 mg | Freq: Once | INTRAVENOUS | Status: AC
  Administered 2020-01-02: 200 mg via INTRAVENOUS
  Filled 2020-01-02: qty 8

## 2020-01-02 MED ORDER — SODIUM CHLORIDE 0.9 % IV SOLN
Freq: Once | INTRAVENOUS | Status: AC
  Filled 2020-01-02: qty 250

## 2020-01-02 MED ORDER — SODIUM CHLORIDE 0.9 % IV SOLN
150.0000 mg | Freq: Once | INTRAVENOUS | Status: AC
  Administered 2020-01-02: 150 mg via INTRAVENOUS
  Filled 2020-01-02: qty 150

## 2020-01-02 MED ORDER — SODIUM CHLORIDE 0.9 % IV SOLN
430.0000 mg | Freq: Once | INTRAVENOUS | Status: AC
  Administered 2020-01-02: 430 mg via INTRAVENOUS
  Filled 2020-01-02: qty 43

## 2020-01-02 MED ORDER — SODIUM CHLORIDE 0.9 % IV SOLN
500.0000 mg/m2 | Freq: Once | INTRAVENOUS | Status: AC
  Administered 2020-01-02: 1000 mg via INTRAVENOUS
  Filled 2020-01-02: qty 40

## 2020-01-02 MED ORDER — PALONOSETRON HCL INJECTION 0.25 MG/5ML
INTRAVENOUS | Status: AC
  Filled 2020-01-02: qty 5

## 2020-01-02 NOTE — Progress Notes (Signed)
Continue Carboplatin 430mg  (AUC 4.3)  today per MD.  Hardie Pulley, PharmD, BCPS, BCOP

## 2020-01-02 NOTE — Progress Notes (Signed)
[facility] [phone]   [phone]  OFFICE PROGRESS NOTE  Carl Ruddy, MD [place] [address]  DIAGNOSIS: 1) Metastatic non-small cell lung cancer, likely adenocarcinoma initially diagnosed as stage IIIA (T2b, N2, M0). Presented with right upper lobe lung mass in addition to right hilar and mediastinal lymphadenopathy diagnosed in September 2020. Showed disease progression with metastatic disease with Malignancy recurrence in the right suprahilar regionand Two hypermetabolic left eccentric mesenteric masses along the small bowel mesentery, along with 3 new hypermetabolic lesions in the liver, compatible with malignancy in June 2021.  2) history of prostate adenocarcinoma status post radiotherapy under the care of Dr. Tammi Harrell. 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  Molecular Biomarkers: [id number] Everolimus, Temsirolimus Yes 4.9% [id number] None Yes 4.7% [id number] Yes 0.2% [id number] None No 5.2% [id number] None No 5.0% [id number] None (VUS) No (VUS) 4.4% [id number] (VUS) No (VUS) 3.5% MSI-HighNOT DETECTED  PRIOR THERAPY: 1) Concurrent chemoradiation with weekly carboplatin for AUC of 2 and paclitaxel 45 mg/M2. First dose March 05, 2019. Status post 7 cycles. Last dose was given April 16, 2019 with partial response. 2)Total thyroidectomy on February 1, 20737for follicular neoplasm of the thyroid under the care of Dr. GHarlow Asa3) Consolidation immunotherapy with immunotherapy with Carl Harrell 1500 mg IV every 4 weeks. Last dose 10/11/19.   CURRENT THERAPY: Systemic chemotherapy with Carboplatin for an AUC of 5, Alimta 500 mg/m2, and Keytruda 200 mg IV every 3 weeks. First dose expected on7/22/2021.   Status post 1 cycle.  INTERVAL HISTORY: Carl LONA75y.o. male returns to the  clinic today for follow-up visit accompanied by his wife.  The patient tolerated the first cycle of this treatment well except for fatigue and weakness for 3 days.  He also has mild nausea improved with the current antiemetics but no vomiting.  He denied having any chest pain but continues to have shortness of breath with exertion with no cough or hemoptysis.  The patient continues to have intermittent epigastric abdominal pain and he is currently on oxycodone as needed.  He denied having any current fever or chills.  He has no headache or visual changes.  He is here today for evaluation before starting cycle #2 of his treatment.  MEDICAL HISTORY: Past Medical History:  Diagnosis Date  . ALLERGIC RHINITIS 01/30/2007  . Arthritis    "hands" (05/29/2014)  . Cancer ([facility]    lung, prostate  . Constipation   . Coronary artery disease    LHC (05/29/14): pLAD 50-70, pRCA 99 (L>R collats), EF 55% >> PCI: 3.5 x 28 mm Xience Alpine DES to [employer] . Cough secondary to angiotensin converting enzyme inhibitor (ACE-I) 05/28/2013  . ED (erectile dysfunction)   . GERD 09/26/2007  . HYPERLIPIDEMIA 09/26/2007  . Hypertension   . Lung cancer ([facility] dx'd 03/2018  . PLANTAR FASCIITIS, BILATERAL 07/08/2009  . Pre-diabetes   . [place]state [place] ([facility] [place] 2017 or 2018   xrt  . Unspecified hearing loss 06/16/2009   wears aides both ears    ALLERGIES:  is allergic to lisinopril.  MEDICATIONS:  Current Outpatient Medications  Medication Sig Dispense Refill  . calcium carbonate (TUMS - DOSED IN MG ELEMENTAL CALCIUM) 500 MG chewable tablet Chew 1 tablet by mouth daily.    . cetirizine (ZYRTEC) 10 MG tablet Take 1 tablet (10 mg total) by mouth daily. (Patient not taking:  Reported on 10/25/2019) 30 tablet 11  . ezetimibe (ZETIA) 10 MG tablet Take 1 tablet (10 mg total) by mouth daily. 90 tablet 2  . fluticasone (FLONASE) 50 MCG/ACT nasal spray SHAKE LIQUID AND USE 1 SPRAY IN EACH NOSTRIL DAILY (Patient taking differently: Place 1  spray into both nostrils daily. ) 48 g 0  . folic acid (FOLVITE) 1 MG tablet Take 1 tablet (1 mg total) by mouth daily. 30 tablet 2  . levothyroxine (SYNTHROID) 125 MCG tablet Take 1 tablet (125 mcg total) by mouth daily. 45 tablet 3  . losartan (COZAAR) 50 MG tablet TAKE 1 TABLET BY MOUTH DAILY 90 tablet 0  . metoprolol tartrate (LOPRESSOR) 25 MG tablet TAKE 1 TABLET BY MOUTH TWICE DAILY 180 tablet 0  . Multiple Vitamin (MULTIVITAMIN WITH MINERALS) TABS tablet Take 1 tablet by mouth daily.    Marland Kitchen omeprazole (PRILOSEC) 40 MG capsule Take 1 capsule (40 mg total) by mouth 2 (two) times daily. 60 capsule 3  . ondansetron (ZOFRAN ODT) 8 MG disintegrating tablet Take 1 tablet (8 mg total) by mouth every 8 (eight) hours as needed for nausea or vomiting. 12 tablet 0  . oxyCODONE (ROXICODONE) 5 MG immediate release tablet Take 1 tablet (5 mg total) by mouth every 6 (six) hours as needed for severe pain. 30 tablet 0  . simvastatin (ZOCOR) 40 MG tablet TAKE 1 TABLET(40 MG) BY MOUTH DAILY AT 6 PM (Patient taking differently: Take 40 mg by mouth daily at 6 PM. ) 90 tablet 3  . STOOL SOFTENER 100 MG capsule Take 100 mg by mouth daily as needed.    . vitamin B-12 (CYANOCOBALAMIN) 50 MCG tablet Take 50 mcg by mouth daily.     No current facility-administered medications for this visit.    SURGICAL HISTORY:  Past Surgical History:  Procedure Laterality Date  . CARDIAC CATHETERIZATION  05/29/2014   Procedure: CORONARY STENT INTERVENTION;  Surgeon: Carl Grooms, MD;  Location[facility] CATH LAB;  Service: Cardiovascular;;  Prox RCA  . COLONOSCOPY    . CORONARY ANGIOPLASTY WITH STENT PLACEMENT  05/29/2014   "1"  . KNEE ARTHROSCOPY Right ~ 2005  . LEFT HEART CATHETERIZATION WITH CORONARY ANGIOGRAM N/A 05/29/2014   Procedure: LEFT HEART CATHETERIZATION WITH CORONARY ANGIOGRAM;  Surgeon: Carl Grooms, MD;  Location: [facility] CATH LAB;  Service: Cardiovascular;  Laterality: N/A;  . THYROIDECTOMY N/A 06/25/2019   Procedure:  TOTAL THYROIDECTOMY;  Surgeon: Carl Gemma, MD;  Location: [facility] ORS;  Service: General;  Laterality: N/A;  . VIDEO BRONCHOSCOPY WITH ENDOBRONCHIAL ULTRASOUND N/A 02/20/2019   Procedure: VIDEO BRONCHOSCOPY WITH ENDOBRONCHIAL ULTRASOUND;  Surgeon: Garner Nash, DO;  Location: [facility];  Service: Thoracic;  Laterality: N/A;    REVIEW OF SYSTEMS:  A comprehensive review of systems was negative except for: Constitutional: positive for fatigue Respiratory: positive for dyspnea on exertion Gastrointestinal: positive for abdominal pain and nausea   PHYSICAL EXAMINATION: General appearance: alert, cooperative, fatigued and no distress Head: Normocephalic, without obvious abnormality, atraumatic Neck: no adenopathy, no JVD, supple, symmetrical, trachea midline and thyroid not enlarged, symmetric, no tenderness/mass/nodules Lymph nodes: Cervical, supraclavicular, and axillary nodes normal. Resp: clear to auscultation bilaterally Back: symmetric, no curvature. ROM normal. No CVA tenderness. Cardio: regular rate and rhythm, S1, S2 normal, no murmur, click, rub or gallop GI: soft, non-tender; bowel sounds normal; no masses,  no organomegaly Extremities: extremities normal, atraumatic, no cyanosis or edema  ECOG PERFORMANCE STATUS: 1 - Symptomatic but completely ambulatory  Blood pressure 114/90, pulse (!) 107, temperature 98.9 F (37.2 C), temperature source Oral, resp. rate 18, height '5\' 7"'$  (1.702 m), weight 184 lb 9.6 oz (83.7 kg), SpO2 98 %.  LABORATORY DATA: Lab Results  Component Value Date   WBC 5.9 12/26/2019   HGB 9.2 (L) 12/26/2019   HCT 30.1 (L) 12/26/2019   MCV 84.8 12/26/2019   PLT 291 12/26/2019      Chemistry      Component Value Date/Time   NA 141 12/26/2019 1037   NA 141 08/05/2016 0826   K 4.5 12/26/2019 1037   CL 108 12/26/2019 1037   CO2 24 12/26/2019 1037   BUN 14 12/26/2019 1037   BUN 14 08/05/2016 0826   CREATININE 1.12 12/26/2019 1037      Component Value  Date/Time   CALCIUM 9.0 12/26/2019 1037   ALKPHOS 275 (H) 12/26/2019 1037   AST 30 12/26/2019 1037   ALT 46 (H) 12/26/2019 1037   BILITOT 0.3 12/26/2019 1037       RADIOGRAPHIC STUDIES: [place] CORE BIOPSY (LIVER)  Result Date: 12/12/2019 CLINICAL DATA:  History of lung carcinoma, thyroid carcinoma, prostate carcinoma. Mesenteric mass, right suprahilar recurrence, liver lesions. Biopsy requested EXAM: ULTRASOUND GUIDED CORE BIOPSY OF LIVER LESION MEDICATIONS: Intravenous Fentanyl 68mg and Versed '1mg'$  were administered as conscious sedation during continuous monitoring of the patient's level of consciousness and physiological / cardiorespiratory status by the radiology RN, with a total moderate sedation time of 10 minutes. PROCEDURE: The procedure, risks, benefits, and alternatives were explained to the patient. Questions regarding the procedure were encouraged and answered. The patient understands and consents to the procedure. Survey ultrasound of the liver was performed. A representative lesion was localized and appropriate skin entry site determined. The operative field was prepped with chlorhexidine in a sterile fashion, and a sterile drape was applied covering the operative field. A sterile gown and sterile gloves were used for the procedure. Local anesthesia was provided with 1% Lidocaine. Under real-time ultrasound guidance, a 17 gauge trocar needle was advanced to the margin of the lesion. Once needle tip position was confirmed, coaxial 18-gauge core biopsy samples were obtained, submitted in formalin to surgical pathology. The guide needle was removed. Postprocedure scans show no hemorrhage or other apparent complication. The patient tolerated the procedure well. COMPLICATIONS: None. FINDINGS: Multiple hypoechoic liver lesions were localized. Core biopsies of a representative lesion were obtained as above. IMPRESSION: 1. Technically successful ultrasound-guided core biopsy, liver lesion.  Electronically Signed   By: DLucrezia EuropeM.D.   On: 12/12/2019 09:23    ASSESSMENT AND PLAN: This is a very pleasant 74years old white male with recently diagnosed a stage IIIa (T2b, N2, M0) non-small cell lung cancer favoring adenocarcinoma presented with right upper lobe lung mass in addition to right hilar and mediastinal lymphadenopathy diagnosed in September 2020. The patient underwent a course of concurrent chemoradiation with weekly carboplatin and paclitaxel status post 7 cycles.  He tolerated the treatment well except for fatigue and dry cough. He has partial response to this treatment.   The patient is currently undergoing consolidation treatment with immunotherapy with Carl Harrell 1500 mg IV every 4 weeks status post 6 cycles.  The patient has been tolerating this treatment well but unfortunately he continues to complain of abdominal pain and recent imaging studies including a PET scan showed malignancy recurrence in the right suprahilar region with 2 hypermetabolic left eccentric mesenteric masses along the small bowel mesentery and 3 new hypermetabolic lesions in the  liver compatible with malignancy.  The patient continues to have intermittent abdominal pain. His stool was positive for Hemoccult.  He was evaluated by gastroenterology and no malignancy was identified. The patient underwent ultrasound-guided core biopsy of the metastatic liver lesion and it was consistent with metastatic adenocarcinoma. He is currently undergoing systemic chemotherapy with carboplatin for AUC of 5, Alimta 500 mg/M2 and Keytruda 200 mg IV every 3 weeks status post 1 cycle.  He tolerated the first cycle of his treatment well except for fatigue and mild nausea. I recommended for the patient to proceed with cycle #2 today as planned. For the pain management, he will continue on oxycodone as needed. For the nausea, he is currently on Zofran. The patient will come back for follow-up visit in 3 weeks for evaluation.  We  will have repeat imaging studies after cycle #3 for restaging of his disease. He was advised to call immediately if he has any concerning symptoms in the interval. For the thyroid cancer, he underwent surgical resection and he is followed by endocrinology.  All questions were answered. The patient knows to call the clinic with any problems, questions or concerns. We can certainly see the patient much sooner if necessary.  Disclaimer: This note was dictated with voice recognition software. Similar sounding words can inadvertently be transcribed and may not be corrected upon review.

## 2020-01-02 NOTE — Progress Notes (Signed)
Patient was identified to be at risk for malnutrition on the MST secondary to poor appetite and weight loss.  74 year old male diagnosed with metastatic non-small cell lung cancer.  He is a patient of Dr. Julien Nordmann.  He is receiving systemic chemotherapy.  Past medical history includes prostate cancer and radiation treatments.  Medications include Synthroid, multivitamin, Prilosec, Zofran, and vitamin B12.  Labs were reviewed.  Height: 5 feet 7 inches. Weight: 184.6 pounds on August 11. Usual body weight: 204 pounds in June. BMI: 28.91.  Patient endorses poor appetite.   States he has mild nausea but no vomiting. He does not tolerate dairy products. He reports 30 pound weight loss from usual body weight. Nutrition focused physical exam deferred.  RN is working with patient to begin his treatment.  Nutrition diagnosis:  Unintended weight loss related to metastatic cancer and associated treatments as evidenced by 9% weight loss in less than 3 months.  This is significant.  Intervention: Patient educated on importance of consuming smaller more frequent meals and snacks including high-calorie, high-protein foods. Educated patient on other sources of protein. Recommended he try Lactaid products to see if tolerance improves. Provided fact sheets. Questions were answered.  Teach back method used.  Contact information provided.  Monitoring, evaluation, goals: Patient will tolerate increased calories and protein to maintain lean body mass.  Next visit: Wednesday, September 1 during infusion.  **Disclaimer: This note was dictated with voice recognition software. Similar sounding words can inadvertently be transcribed and this note may contain transcription errors which may not have been corrected upon publication of note.**

## 2020-01-02 NOTE — Progress Notes (Signed)
Per Dr Julien Nordmann it is okay to treat pt today with Carboplatin .Alimta and keyturda and using  labs from 12/26/2019.

## 2020-01-02 NOTE — Patient Instructions (Signed)
[facility] Discharge Instructions for Patients Receiving Chemotherapy  Today you received the following chemotherapy agents Keytruda, Alimta and Carboplatin   To help prevent nausea and vomiting after your treatment, we encourage you to take your nausea medication as directed.    If you develop nausea and vomiting that is not controlled by your nausea medication, call the clinic.   BELOW ARE SYMPTOMS THAT SHOULD BE REPORTED IMMEDIATELY:  *FEVER GREATER THAN 100.5 F  *CHILLS WITH OR WITHOUT FEVER  NAUSEA AND VOMITING THAT IS NOT CONTROLLED WITH YOUR NAUSEA MEDICATION  *UNUSUAL SHORTNESS OF BREATH  *UNUSUAL BRUISING OR BLEEDING  TENDERNESS IN MOUTH AND THROAT WITH OR WITHOUT PRESENCE OF ULCERS  *URINARY PROBLEMS  *BOWEL PROBLEMS  UNUSUAL RASH Items with * indicate a potential emergency and should be followed up as soon as possible.  Feel free to call the clinic should you have any questions or concerns. The clinic phone number is [phone].  Please show the [facility] at check-in to the Emergency Department and triage nurse.

## 2020-01-03 ENCOUNTER — Other Ambulatory Visit: Payer: Self-pay | Admitting: Medical Oncology

## 2020-01-03 NOTE — Telephone Encounter (Signed)
Requests refill for zofran and oxycodone.

## 2020-01-04 ENCOUNTER — Telehealth: Payer: Self-pay | Admitting: *Deleted

## 2020-01-04 NOTE — Telephone Encounter (Signed)
Patient called requesting refill of Oxycodone.    He states that his normal pharmacies system is down and refills need to be sent to [facility] on [address].    That pharmacy has been added to the chart.  Request routed to MD.

## 2020-01-07 ENCOUNTER — Telehealth: Payer: Self-pay | Admitting: Medical Oncology

## 2020-01-07 ENCOUNTER — Other Ambulatory Visit: Payer: Self-pay | Admitting: Internal Medicine

## 2020-01-07 DIAGNOSIS — C3491 Malignant neoplasm of unspecified part of right bronchus or lung: Secondary | ICD-10-CM

## 2020-01-07 MED ORDER — OXYCODONE HCL 5 MG PO TABS: 5.0000 mg | ORAL_TABLET | Freq: Four times a day (QID) | ORAL | 0 refills | Status: DC | PRN

## 2020-01-07 NOTE — Telephone Encounter (Signed)
Please refill oxycodone. [facility] [address]

## 2020-01-08 ENCOUNTER — Inpatient Hospital Stay (HOSPITAL_BASED_OUTPATIENT_CLINIC_OR_DEPARTMENT_OTHER): Payer: Medicare Other | Admitting: Hematology and Oncology

## 2020-01-08 ENCOUNTER — Other Ambulatory Visit: Payer: Self-pay

## 2020-01-08 ENCOUNTER — Encounter: Payer: Self-pay | Admitting: Hematology and Oncology

## 2020-01-08 VITALS — BP 102/69 | HR 80 | Temp 98.3°F | Resp 18 | Ht 67.0 in | Wt 178.6 lb

## 2020-01-08 DIAGNOSIS — C786 Secondary malignant neoplasm of retroperitoneum and peritoneum: Secondary | ICD-10-CM | POA: Diagnosis not present

## 2020-01-08 DIAGNOSIS — C61 Malignant neoplasm of prostate: Secondary | ICD-10-CM | POA: Diagnosis not present

## 2020-01-08 DIAGNOSIS — C3411 Malignant neoplasm of upper lobe, right bronchus or lung: Secondary | ICD-10-CM | POA: Diagnosis not present

## 2020-01-08 DIAGNOSIS — Z5111 Encounter for antineoplastic chemotherapy: Secondary | ICD-10-CM | POA: Diagnosis not present

## 2020-01-08 DIAGNOSIS — Z923 Personal history of irradiation: Secondary | ICD-10-CM | POA: Diagnosis not present

## 2020-01-08 DIAGNOSIS — C73 Malignant neoplasm of thyroid gland: Secondary | ICD-10-CM | POA: Diagnosis not present

## 2020-01-08 DIAGNOSIS — Z9221 Personal history of antineoplastic chemotherapy: Secondary | ICD-10-CM | POA: Diagnosis not present

## 2020-01-08 DIAGNOSIS — Z7189 Other specified counseling: Secondary | ICD-10-CM | POA: Diagnosis not present

## 2020-01-08 DIAGNOSIS — Z87891 Personal history of nicotine dependence: Secondary | ICD-10-CM | POA: Diagnosis not present

## 2020-01-08 DIAGNOSIS — I2510 Atherosclerotic heart disease of native coronary artery without angina pectoris: Secondary | ICD-10-CM | POA: Diagnosis not present

## 2020-01-08 DIAGNOSIS — Z5112 Encounter for antineoplastic immunotherapy: Secondary | ICD-10-CM | POA: Diagnosis not present

## 2020-01-08 DIAGNOSIS — C787 Secondary malignant neoplasm of liver and intrahepatic bile duct: Secondary | ICD-10-CM | POA: Diagnosis not present

## 2020-01-08 DIAGNOSIS — C3491 Malignant neoplasm of unspecified part of right bronchus or lung: Secondary | ICD-10-CM

## 2020-01-08 DIAGNOSIS — I10 Essential (primary) hypertension: Secondary | ICD-10-CM | POA: Diagnosis not present

## 2020-01-08 DIAGNOSIS — Z79899 Other long term (current) drug therapy: Secondary | ICD-10-CM | POA: Diagnosis not present

## 2020-01-08 NOTE — Assessment & Plan Note (Signed)
He has history of prostate cancer, with his PSA level last checked in 2018 The pattern of disease seen on his most recent imaging studies are more compatible with lung cancer I recommend checking his PSA tomorrow along with the rest of his blood draw and we will call him with test results He is in agreement

## 2020-01-08 NOTE — Assessment & Plan Note (Signed)
The patient is diagnosed with stage IV lung cancer He understood that the goals of treatment is palliative In my opinion, he is getting excellent care under the guidance of Dr. Julien Nordmann I have nothing to add in this situation and recommend he continues treatment and follow-up as prescribed

## 2020-01-08 NOTE — Assessment & Plan Note (Signed)
The patient understood that he has stage IV metastatic cancer The treatment goal is palliative He is aware that he needs to stay on treatment for the rest of his life We discussed CODE STATUS, advanced directives and living will The patient has copy of his advance directives on file He has indicated that he does not want aggressive life support in the event of terminal illness He is asking about support group that he can attend for additional support I will reach out to social worker to see if support group is available for him to cope with his illness

## 2020-01-08 NOTE — Assessment & Plan Note (Signed)
His most recent PET CT scan from June is reviewed with the patient He has no evidence of residual disease in the thyroid bed At this point in time, I do not recommend him to proceed with radioactive iodine treatment He will continue close follow-up with his endocrinologist for thyroid replacement therapy

## 2020-01-08 NOTE — Progress Notes (Signed)
[place] FOLLOW-UP progress notes  Patient Care Team: [name], MD as PCP - General (Family Medicine) Sueanne Margarita, MD as Consulting Physician (Cardiology) Valrie Hart, RN as Oncology Nurse Navigator  CHIEF COMPLAINTS/PURPOSE OF VISIT:  Stage IV metastatic lung cancer on treatment, history of prostate cancer and thyroid cancer, for second opinion  HISTORY OF PRESENTING ILLNESS:  Carl Harrell 74 y.o. male is seen here today for second opinion per patient request He is accompanied by his wife Carl Harrell The patient has a history of prostate cancer, status post radiation therapy He completed treatment with his last PSA checked in 2018 In 2020, he presented with a lung mass and subsequent evaluation and biopsy confirmed locally advanced lung cancer The patient received concurrent chemoradiation therapy followed by maintenance immunotherapy He was also diagnosed with thyroid cancer status post thyroidectomy.  His recent PET CT scan in June 2021 showed evidence of liver metastasis and disease in the peritoneal region On November 23, 2019, he underwent colonoscopy with biopsy confirming benign adenomas Subsequently, he underwent liver biopsy which showed adenocarcinoma His treatment was subsequently switched with carboplatin, Alimta and Keytruda Overall, he tolerated treatment fairly well except for fatigue He denies abdominal pain today Denies recent cough, chest pain or shortness of breath  I reviewed the patient's records extensive and collaborated the history with the patient. Summary of his history is as follows: Oncology History  Adenocarcinoma of right lung, stage 3 ([place])  02/28/2019 Initial Diagnosis   Adenocarcinoma of right lung, stage 3 ([place])   03/05/2019 - 04/15/2019 Chemotherapy   The patient had palonosetron (ALOXI) injection 0.25 mg, 0.25 mg, Intravenous,  Once, 6 of 7 cycles Administration: 0.25 mg (03/05/2019), 0.25 mg (04/02/2019), 0.25 mg  (04/09/2019), 0.25 mg (03/12/2019), 0.25 mg (03/19/2019), 0.25 mg (03/26/2019) CARBOplatin (PARAPLATIN) 200 mg in sodium chloride 0.9 % 250 mL chemo infusion, 200 mg (100 % of original dose 204.6 mg), Intravenous,  Once, 6 of 7 cycles Dose modification: 204.6 mg (original dose 204.6 mg, Cycle 1) Administration: 200 mg (03/05/2019), 200 mg (04/02/2019), 200 mg (04/09/2019), 200 mg (03/12/2019), 200 mg (03/19/2019), 200 mg (03/26/2019) PACLitaxel (TAXOL) 90 mg in sodium chloride 0.9 % 250 mL chemo infusion (</= 80mg /m2), 45 mg/m2 = 90 mg, Intravenous,  Once, 6 of 7 cycles Administration: 90 mg (03/05/2019), 90 mg (04/02/2019), 90 mg (04/09/2019), 90 mg (03/12/2019), 90 mg (03/19/2019), 90 mg (03/26/2019)  for chemotherapy treatment.    05/22/2019 - 10/11/2019 Chemotherapy   The patient had durvalumab (IMFINZI) 1,500 mg in sodium chloride 0.9 % 100 mL chemo infusion, 1,500 mg (100 % of original dose 1,500 mg), Intravenous,  Once, 6 of 13 cycles Dose modification: 360 mg (original dose 360 mg, Cycle 1, Reason: Provider Judgment), 1,500 mg (original dose 1,500 mg, Cycle 1, Reason: Other (see comments), Comment: flat dose of 1500mg ) Administration: 1,500 mg (05/22/2019), 1,500 mg (06/20/2019), 1,500 mg (07/18/2019), 1,500 mg (08/15/2019), 1,500 mg (09/13/2019), 1,500 mg (10/11/2019)  for chemotherapy treatment.    12/13/2019 -  Chemotherapy   The patient had palonosetron (ALOXI) injection 0.25 mg, 0.25 mg, Intravenous,  Once, 2 of 4 cycles Administration: 0.25 mg (12/13/2019), 0.25 mg (01/02/2020) PEMEtrexed (ALIMTA) 1,000 mg in sodium chloride 0.9 % 100 mL chemo infusion, 500 mg/m2 = 1,000 mg, Intravenous,  Once, 2 of 6 cycles Administration: 1,000 mg (12/13/2019), 1,000 mg (01/02/2020) CARBOplatin (PARAPLATIN) 430 mg in sodium chloride 0.9 % 250 mL chemo infusion, 430 mg (100 % of original dose 426.5 mg),  Intravenous,  Once, 2 of 4 cycles Dose modification: 426.5 mg (original dose 426.5 mg, Cycle 1), 478 mg  (original dose 478 mg, Cycle 2) Administration: 430 mg (12/13/2019), 430 mg (01/02/2020) fosaprepitant (EMEND) 150 mg in sodium chloride 0.9 % 145 mL IVPB, 150 mg, Intravenous,  Once, 2 of 4 cycles Administration: 150 mg (12/13/2019), 150 mg (01/02/2020) pembrolizumab (KEYTRUDA) 200 mg in sodium chloride 0.9 % 50 mL chemo infusion, 200 mg, Intravenous, Once, 2 of 6 cycles Administration: 200 mg (12/13/2019), 200 mg (01/02/2020)  for chemotherapy treatment.    Adenocarcinoma of right lung, stage 4 ([place])  12/03/2019 Initial Diagnosis   Adenocarcinoma of right lung, stage 4 ([place])   12/13/2019 -  Chemotherapy   The patient had palonosetron (ALOXI) injection 0.25 mg, 0.25 mg, Intravenous,  Once, 2 of 4 cycles Administration: 0.25 mg (12/13/2019), 0.25 mg (01/02/2020) PEMEtrexed (ALIMTA) 1,000 mg in sodium chloride 0.9 % 100 mL chemo infusion, 500 mg/m2 = 1,000 mg, Intravenous,  Once, 2 of 6 cycles Administration: 1,000 mg (12/13/2019), 1,000 mg (01/02/2020) CARBOplatin (PARAPLATIN) 430 mg in sodium chloride 0.9 % 250 mL chemo infusion, 430 mg (100 % of original dose 426.5 mg), Intravenous,  Once, 2 of 4 cycles Dose modification: 426.5 mg (original dose 426.5 mg, Cycle 1), 478 mg (original dose 478 mg, Cycle 2) Administration: 430 mg (12/13/2019), 430 mg (01/02/2020) fosaprepitant (EMEND) 150 mg in sodium chloride 0.9 % 145 mL IVPB, 150 mg, Intravenous,  Once, 2 of 4 cycles Administration: 150 mg (12/13/2019), 150 mg (01/02/2020) pembrolizumab (KEYTRUDA) 200 mg in sodium chloride 0.9 % 50 mL chemo infusion, 200 mg, Intravenous, Once, 2 of 6 cycles Administration: 200 mg (12/13/2019), 200 mg (01/02/2020)  for chemotherapy treatment.      MEDICAL HISTORY:  Past Medical History:  Diagnosis Date  . ALLERGIC RHINITIS 01/30/2007  . Arthritis    "hands" (05/29/2014)  . Cancer ([place])    lung, prostate  . Constipation   . Coronary artery disease    LHC (05/29/14): pLAD 50-70, pRCA 99 (L>R collats), EF 55% >> PCI:  3.5 x 28 mm Xience Alpine DES to [employer]  . Cough secondary to angiotensin converting enzyme inhibitor (ACE-I) 05/28/2013  . ED (erectile dysfunction)   . GERD 09/26/2007  . HYPERLIPIDEMIA 09/26/2007  . Hypertension   . Lung cancer ([place]) dx'd 03/2018  . PLANTAR FASCIITIS, BILATERAL 07/08/2009  . Pre-diabetes   . Prostate CA ([place]) dx'd 2017 or 2018   xrt  . Unspecified hearing loss 06/16/2009   wears aides both ears    SURGICAL HISTORY: Past Surgical History:  Procedure Laterality Date  . CARDIAC CATHETERIZATION  05/29/2014   Procedure: CORONARY STENT INTERVENTION;  Surgeon: Sinclair Grooms, MD;  Location: [facility] CATH LAB;  Service: Cardiovascular;;  Prox RCA  . COLONOSCOPY    . CORONARY ANGIOPLASTY WITH STENT PLACEMENT  05/29/2014   "1"  . KNEE ARTHROSCOPY Right ~ 2005  . LEFT HEART CATHETERIZATION WITH CORONARY ANGIOGRAM N/A 05/29/2014   Procedure: LEFT HEART CATHETERIZATION WITH CORONARY ANGIOGRAM;  Surgeon: Sinclair Grooms, MD;  Location: [facility] CATH LAB;  Service: Cardiovascular;  Laterality: N/A;  . THYROIDECTOMY N/A 06/25/2019   Procedure: TOTAL THYROIDECTOMY;  Surgeon: Armandina Gemma, MD;  Location: [facility] ORS;  Service: General;  Laterality: N/A;  . VIDEO BRONCHOSCOPY WITH ENDOBRONCHIAL ULTRASOUND N/A 02/20/2019   Procedure: VIDEO BRONCHOSCOPY WITH ENDOBRONCHIAL ULTRASOUND;  Surgeon: Garner Nash, DO;  Location: [facility];  Service: Thoracic;  Laterality: N/A;    SOCIAL HISTORY:  Social History   Socioeconomic History  . Marital status: Married    Spouse name: Not on file  . Number of children: Not on file  . Years of education: Not on file  . Highest education level: Not on file  Occupational History  . Not on file  Tobacco Use  . Smoking status: Former Smoker    Packs/day: 0.20    Years: 15.00    Pack years: 3.00    Types: Cigarettes    Quit date: 05/24/1978    Years since quitting: 41.6  . Smokeless tobacco: Never Used  . Tobacco comment: 05/29/2014 "smoked socially; nothing since 1980"   Vaping Use  . Vaping Use: Never used  Substance and Sexual Activity  . Alcohol use: Yes    Alcohol/week: 2.0 standard drinks    Types: 2 Shots of liquor per week    Comment: beer or two a week  . Drug use: No  . Sexual activity: Not Currently  Other Topics Concern  . Not on file  Social History Narrative   Lives with wife in a one story home.  Has 2 children.  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  Education: 3 years of college.    Does wood working.    Social Determinants of Health   Financial Resource Strain:   . Difficulty of Paying Living Expenses:   Food Insecurity:   . Worried About Charity fundraiser in the Last Year:   . Arboriculturist in the Last Year:   Transportation Needs:   . Film/video editor (Medical):   Marland Kitchen Lack of Transportation (Non-Medical):   Physical Activity:   . Days of Exercise per Week:   . Minutes of Exercise per Session:   Stress:   . Feeling of Stress :   Social Connections:   . Frequency of Communication with Friends and Family:   . Frequency of Social Gatherings with Friends and Family:   . Attends Religious Services:   . Active Member of Clubs or Organizations:   . Attends Archivist Meetings:   Marland Kitchen Marital Status:   Intimate Partner Violence:   . Fear of Current or Ex-Partner:   . Emotionally Abused:   Marland Kitchen Physically Abused:   . Sexually Abused:     FAMILY HISTORY: Family History  Problem Relation Age of Onset  . Heart attack Mother   . Heart disease Mother   . Heart attack Father   . Heart disease Father   . COPD Neg Hx        family hx  . Hyperlipidemia Neg Hx        family hx    ALLERGIES:  is allergic to lisinopril.  MEDICATIONS:  Current Outpatient Medications  Medication Sig Dispense Refill  . calcium carbonate (TUMS - DOSED IN MG ELEMENTAL CALCIUM) 500 MG chewable tablet Chew 1 tablet by mouth daily.    . cetirizine (ZYRTEC) 10 MG tablet Take 1 tablet (10 mg total) by mouth daily. (Patient not taking: Reported on  10/25/2019) 30 tablet 11  . ezetimibe (ZETIA) 10 MG tablet Take 1 tablet (10 mg total) by mouth daily. 90 tablet 2  . fluticasone (FLONASE) 50 MCG/ACT nasal spray SHAKE LIQUID AND USE 1 SPRAY IN EACH NOSTRIL DAILY (Patient taking differently: Place 1 spray into both nostrils daily. ) 48 g 0  . folic acid (FOLVITE) 1 MG tablet Take 1 tablet (1 mg total) by mouth daily. 30 tablet 2  . levothyroxine (SYNTHROID) 125 MCG tablet  Take 1 tablet (125 mcg total) by mouth daily. 45 tablet 3  . losartan (COZAAR) 50 MG tablet TAKE 1 TABLET BY MOUTH DAILY 90 tablet 0  . metoprolol tartrate (LOPRESSOR) 25 MG tablet TAKE 1 TABLET BY MOUTH TWICE DAILY 180 tablet 0  . Multiple Vitamin (MULTIVITAMIN WITH MINERALS) TABS tablet Take 1 tablet by mouth daily.    Marland Kitchen omeprazole (PRILOSEC) 40 MG capsule Take 1 capsule (40 mg total) by mouth 2 (two) times daily. 60 capsule 3  . ondansetron (ZOFRAN ODT) 8 MG disintegrating tablet Take 1 tablet (8 mg total) by mouth every 8 (eight) hours as needed for nausea or vomiting. 12 tablet 0  . oxyCODONE (ROXICODONE) 5 MG immediate release tablet Take 1 tablet (5 mg total) by mouth every 6 (six) hours as needed for severe pain. 30 tablet 0  . simvastatin (ZOCOR) 40 MG tablet TAKE 1 TABLET(40 MG) BY MOUTH DAILY AT 6 PM (Patient taking differently: Take 40 mg by mouth daily at 6 PM. ) 90 tablet 3  . STOOL SOFTENER 100 MG capsule Take 100 mg by mouth daily as needed.    . vitamin B-12 (CYANOCOBALAMIN) 50 MCG tablet Take 50 mcg by mouth daily.     No current facility-administered medications for this visit.    REVIEW OF SYSTEMS:   Constitutional: Denies fevers, chills or abnormal night sweats Eyes: Denies blurriness of vision, double vision or watery eyes Ears, nose, mouth, throat, and face: Denies mucositis or sore throat Respiratory: Denies cough, dyspnea or wheezes Cardiovascular: Denies palpitation, chest discomfort or lower extremity swelling Gastrointestinal:  Denies nausea,  heartburn or change in bowel habits Skin: Denies abnormal skin rashes Lymphatics: Denies new lymphadenopathy or easy bruising Neurological:Denies numbness, tingling or new weaknesses Behavioral/Psych: Mood is stable, no new changes  All other systems were reviewed with the patient and are negative.  PHYSICAL EXAMINATION: ECOG PERFORMANCE STATUS: 1 - Symptomatic but completely ambulatory  Vitals:   01/08/20 1305  BP: 102/69  Pulse: 80  Resp: 18  Temp: 98.3 F (36.8 C)  SpO2: 99%   Filed Weights   01/08/20 1305  Weight: 178 lb 9.6 oz (81 kg)    GENERAL:alert, no distress and comfortable NEURO: no focal motor/sensory deficits  LABORATORY DATA:  I have reviewed the data as listed Lab Results  Component Value Date   WBC 9.6 01/02/2020   HGB 10.1 (L) 01/02/2020   HCT 32.9 (L) 01/02/2020   MCV 84.1 01/02/2020   PLT 467 (H) 01/02/2020   Recent Labs    12/19/19 0839 12/26/19 1037 01/02/20 1050  NA 137 141 139  K 4.6 4.5 4.5  CL 103 108 104  CO2 24 24 24   GLUCOSE 95 109* 104*  BUN 16 14 9   CREATININE 1.08 1.12 1.00  CALCIUM 8.9 9.0 9.2  GFRNONAA >60 >60 >60  GFRAA >60 >60 >60  PROT 6.7 6.6 6.9  ALBUMIN 2.7* 2.8* 3.0*  AST 49* 30 31  ALT 56* 46* 26  ALKPHOS 233* 275* 253*  BILITOT 0.6 0.3 0.4    RADIOGRAPHIC STUDIES: I have reviewed multiple imaging studies with the patient including his last PET CT scan I have personally reviewed the radiological images as listed and agreed with the findings in the report. [place] CORE BIOPSY (LIVER)  Result Date: 12/12/2019 CLINICAL DATA:  History of lung carcinoma, thyroid carcinoma, prostate carcinoma. Mesenteric mass, right suprahilar recurrence, liver lesions. Biopsy requested EXAM: ULTRASOUND GUIDED CORE BIOPSY OF LIVER LESION MEDICATIONS: Intravenous Fentanyl 65mcg and  Versed 1mg  were administered as conscious sedation during continuous monitoring of the patient's level of consciousness and physiological / cardiorespiratory  status by the radiology RN, with a total moderate sedation time of 10 minutes. PROCEDURE: The procedure, risks, benefits, and alternatives were explained to the patient. Questions regarding the procedure were encouraged and answered. The patient understands and consents to the procedure. Survey ultrasound of the liver was performed. A representative lesion was localized and appropriate skin entry site determined. The operative field was prepped with chlorhexidine in a sterile fashion, and a sterile drape was applied covering the operative field. A sterile gown and sterile gloves were used for the procedure. Local anesthesia was provided with 1% Lidocaine. Under real-time ultrasound guidance, a 17 gauge trocar needle was advanced to the margin of the lesion. Once needle tip position was confirmed, coaxial 18-gauge core biopsy samples were obtained, submitted in formalin to surgical pathology. The guide needle was removed. Postprocedure scans show no hemorrhage or other apparent complication. The patient tolerated the procedure well. COMPLICATIONS: None. FINDINGS: Multiple hypoechoic liver lesions were localized. Core biopsies of a representative lesion were obtained as above. IMPRESSION: 1. Technically successful ultrasound-guided core biopsy, liver lesion. Electronically Signed   By: Lucrezia Europe M.D.   On: 12/12/2019 09:23    ASSESSMENT & PLAN:  Adenocarcinoma of right lung, stage 4 ([facility]) The patient is diagnosed with stage IV lung cancer He understood that the goals of treatment is palliative In my opinion, he is getting excellent care under the guidance of Dr. Julien Nordmann I have nothing to add in this situation and recommend he continues treatment and follow-up as prescribed  Primary thyroid follicular carcinoma ([place]) His most recent PET CT scan from June is reviewed with the patient He has no evidence of residual disease in the thyroid bed At this point in time, I do not recommend him to proceed with  radioactive iodine treatment He will continue close follow-up with his endocrinologist for thyroid replacement therapy  Prostate cancer [facility]) He has history of prostate cancer, with his PSA level last checked in 2018 The pattern of disease seen on his most recent imaging studies are more compatible with lung cancer I recommend checking his PSA tomorrow along with the rest of his blood draw and we will call him with test results He is in agreement  Goals of care, counseling/discussion The patient understood that he has stage IV metastatic cancer The treatment goal is palliative He is aware that he needs to stay on treatment for the rest of his life We discussed CODE STATUS, advanced directives and living will The patient has copy of his advance directives on file He has indicated that he does not want aggressive life support in the event of terminal illness He is asking about support group that he can attend for additional support I will reach out to social worker to see if support group is available for him to cope with his illness   Orders Placed This Encounter  Procedures  . PSA, total and free    Standing Status:   Future    Standing Expiration Date:   01/07/2021    All questions were answered. The patient knows to call the clinic with any problems, questions or concerns. The total time spent in the appointment was 55 minutes encounter with patients including review of chart and various tests results, discussions about plan of care and coordination of care plan   Heath Lark, MD 01/08/2020 3:41 PM

## 2020-01-09 ENCOUNTER — Inpatient Hospital Stay: Payer: Medicare Other

## 2020-01-09 ENCOUNTER — Other Ambulatory Visit: Payer: Self-pay

## 2020-01-09 DIAGNOSIS — Z5112 Encounter for antineoplastic immunotherapy: Secondary | ICD-10-CM | POA: Diagnosis not present

## 2020-01-09 DIAGNOSIS — C786 Secondary malignant neoplasm of retroperitoneum and peritoneum: Secondary | ICD-10-CM | POA: Diagnosis not present

## 2020-01-09 DIAGNOSIS — C61 Malignant neoplasm of prostate: Secondary | ICD-10-CM

## 2020-01-09 DIAGNOSIS — Z9221 Personal history of antineoplastic chemotherapy: Secondary | ICD-10-CM | POA: Diagnosis not present

## 2020-01-09 DIAGNOSIS — I10 Essential (primary) hypertension: Secondary | ICD-10-CM | POA: Diagnosis not present

## 2020-01-09 DIAGNOSIS — C787 Secondary malignant neoplasm of liver and intrahepatic bile duct: Secondary | ICD-10-CM | POA: Diagnosis not present

## 2020-01-09 DIAGNOSIS — Z5111 Encounter for antineoplastic chemotherapy: Secondary | ICD-10-CM | POA: Diagnosis not present

## 2020-01-09 DIAGNOSIS — C3491 Malignant neoplasm of unspecified part of right bronchus or lung: Secondary | ICD-10-CM

## 2020-01-09 DIAGNOSIS — I2510 Atherosclerotic heart disease of native coronary artery without angina pectoris: Secondary | ICD-10-CM | POA: Diagnosis not present

## 2020-01-09 DIAGNOSIS — C73 Malignant neoplasm of thyroid gland: Secondary | ICD-10-CM | POA: Diagnosis not present

## 2020-01-09 DIAGNOSIS — C3411 Malignant neoplasm of upper lobe, right bronchus or lung: Secondary | ICD-10-CM | POA: Diagnosis not present

## 2020-01-09 DIAGNOSIS — Z79899 Other long term (current) drug therapy: Secondary | ICD-10-CM | POA: Diagnosis not present

## 2020-01-09 DIAGNOSIS — Z923 Personal history of irradiation: Secondary | ICD-10-CM | POA: Diagnosis not present

## 2020-01-09 DIAGNOSIS — Z87891 Personal history of nicotine dependence: Secondary | ICD-10-CM | POA: Diagnosis not present

## 2020-01-09 LAB — CMP (CANCER CENTER ONLY)
ALT: 24 U/L (ref 0–44)
AST: 24 U/L (ref 15–41)
Albumin: 2.8 g/dL — ABNORMAL LOW (ref 3.5–5.0)
Alkaline Phosphatase: 253 U/L — ABNORMAL HIGH (ref 38–126)
Anion gap: 9 (ref 5–15)
BUN: 15 mg/dL (ref 8–23)
CO2: 22 mmol/L (ref 22–32)
Calcium: 9.1 mg/dL (ref 8.9–10.3)
Chloride: 103 mmol/L (ref 98–111)
Creatinine: 0.96 mg/dL (ref 0.61–1.24)
GFR, Est AFR Am: >60 mL/min (ref >60)
GFR, Estimated: >60 mL/min (ref >60)
Glucose, Bld: 100 mg/dL — ABNORMAL HIGH (ref 70–99)
Potassium: 4.2 mmol/L (ref 3.5–5.1)
Sodium: 134 mmol/L — ABNORMAL LOW (ref 135–145)
Total Bilirubin: 0.5 mg/dL (ref 0.3–1.2)
Total Protein: 6.8 g/dL (ref 6.5–8.1)

## 2020-01-09 LAB — CBC WITH DIFFERENTIAL (CANCER CENTER ONLY)
Abs Immature Granulocytes: 0.02 10*3/uL (ref 0.00–0.07)
Basophils Absolute: 0 10*3/uL (ref 0.0–0.1)
Basophils Relative: 0 %
Eosinophils Absolute: 0 10*3/uL (ref 0.0–0.5)
Eosinophils Relative: 0 %
HCT: 28.2 % — ABNORMAL LOW (ref 39.0–52.0)
Hemoglobin: 9 g/dL — ABNORMAL LOW (ref 13.0–17.0)
Immature Granulocytes: 1 %
Lymphocytes Relative: 20 %
Lymphs Abs: 0.6 10*3/uL — ABNORMAL LOW (ref 0.7–4.0)
MCH: 26.3 pg (ref 26.0–34.0)
MCHC: 31.9 g/dL (ref 30.0–36.0)
MCV: 82.5 fL (ref 80.0–100.0)
Monocytes Absolute: 0.5 10*3/uL (ref 0.1–1.0)
Monocytes Relative: 15 %
Neutro Abs: 2.1 10*3/uL (ref 1.7–7.7)
Neutrophils Relative %: 64 %
Platelet Count: 340 10*3/uL (ref 150–400)
RBC: 3.42 MIL/uL — ABNORMAL LOW (ref 4.22–5.81)
RDW: 16 % — ABNORMAL HIGH (ref 11.5–15.5)
WBC Count: 3.2 10*3/uL — ABNORMAL LOW (ref 4.0–10.5)
nRBC: 0 % (ref 0.0–0.2)

## 2020-01-10 ENCOUNTER — Telehealth: Payer: Self-pay

## 2020-01-10 LAB — PSA, TOTAL AND FREE
PSA, Free Pct: 16.7 %
PSA, Free: 0.05 ng/mL
Prostate Specific Ag, Serum: 0.3 ng/mL (ref 0.0–4.0)

## 2020-01-10 NOTE — Telephone Encounter (Signed)
Called and given below message. He verbalzied understanding.

## 2020-01-10 NOTE — Telephone Encounter (Signed)
-----   Message from Heath Lark, MD sent at 01/10/2020  8:42 AM EDT ----- Regarding: pls let him know PSA is normal

## 2020-01-11 ENCOUNTER — Telehealth: Payer: Self-pay | Admitting: General Practice

## 2020-01-11 NOTE — Telephone Encounter (Signed)
[facility] CSW Progress Notes  Call to patient at request of [place].  Patient interested in Living w Cancer support group.  Left VM w my contact information, date/time of next group meeting, encouragement to call back for more information.  Edwyna Shell, LCSW Clinical Social Worker Phone:  [phone]

## 2020-01-16 ENCOUNTER — Other Ambulatory Visit: Payer: Self-pay | Admitting: Internal Medicine

## 2020-01-16 ENCOUNTER — Inpatient Hospital Stay: Payer: Medicare Other

## 2020-01-16 ENCOUNTER — Telehealth: Payer: Self-pay | Admitting: Medical Oncology

## 2020-01-16 ENCOUNTER — Other Ambulatory Visit: Payer: Self-pay

## 2020-01-16 DIAGNOSIS — Z5111 Encounter for antineoplastic chemotherapy: Secondary | ICD-10-CM | POA: Diagnosis not present

## 2020-01-16 DIAGNOSIS — C3411 Malignant neoplasm of upper lobe, right bronchus or lung: Secondary | ICD-10-CM | POA: Diagnosis not present

## 2020-01-16 DIAGNOSIS — C3491 Malignant neoplasm of unspecified part of right bronchus or lung: Secondary | ICD-10-CM

## 2020-01-16 DIAGNOSIS — Z79899 Other long term (current) drug therapy: Secondary | ICD-10-CM | POA: Diagnosis not present

## 2020-01-16 DIAGNOSIS — I10 Essential (primary) hypertension: Secondary | ICD-10-CM | POA: Diagnosis not present

## 2020-01-16 DIAGNOSIS — C786 Secondary malignant neoplasm of retroperitoneum and peritoneum: Secondary | ICD-10-CM | POA: Diagnosis not present

## 2020-01-16 DIAGNOSIS — Z87891 Personal history of nicotine dependence: Secondary | ICD-10-CM | POA: Diagnosis not present

## 2020-01-16 DIAGNOSIS — Z5112 Encounter for antineoplastic immunotherapy: Secondary | ICD-10-CM | POA: Diagnosis not present

## 2020-01-16 DIAGNOSIS — Z9221 Personal history of antineoplastic chemotherapy: Secondary | ICD-10-CM | POA: Diagnosis not present

## 2020-01-16 DIAGNOSIS — Z923 Personal history of irradiation: Secondary | ICD-10-CM | POA: Diagnosis not present

## 2020-01-16 DIAGNOSIS — C73 Malignant neoplasm of thyroid gland: Secondary | ICD-10-CM | POA: Diagnosis not present

## 2020-01-16 DIAGNOSIS — I2510 Atherosclerotic heart disease of native coronary artery without angina pectoris: Secondary | ICD-10-CM | POA: Diagnosis not present

## 2020-01-16 DIAGNOSIS — C787 Secondary malignant neoplasm of liver and intrahepatic bile duct: Secondary | ICD-10-CM | POA: Diagnosis not present

## 2020-01-16 LAB — CMP (CANCER CENTER ONLY)
ALT: 22 U/L (ref 0–44)
AST: 23 U/L (ref 15–41)
Albumin: 2.8 g/dL — ABNORMAL LOW (ref 3.5–5.0)
Alkaline Phosphatase: 195 U/L — ABNORMAL HIGH (ref 38–126)
Anion gap: 8 (ref 5–15)
BUN: 11 mg/dL (ref 8–23)
CO2: 26 mmol/L (ref 22–32)
Calcium: 9.3 mg/dL (ref 8.9–10.3)
Chloride: 103 mmol/L (ref 98–111)
Creatinine: 1.04 mg/dL (ref 0.61–1.24)
GFR, Est AFR Am: >60 mL/min (ref >60)
GFR, Estimated: >60 mL/min (ref >60)
Glucose, Bld: 115 mg/dL — ABNORMAL HIGH (ref 70–99)
Potassium: 4.3 mmol/L (ref 3.5–5.1)
Sodium: 137 mmol/L (ref 135–145)
Total Bilirubin: 0.4 mg/dL (ref 0.3–1.2)
Total Protein: 6.6 g/dL (ref 6.5–8.1)

## 2020-01-16 LAB — CBC WITH DIFFERENTIAL (CANCER CENTER ONLY)
Abs Immature Granulocytes: 0.03 10*3/uL (ref 0.00–0.07)
Basophils Absolute: 0 10*3/uL (ref 0.0–0.1)
Basophils Relative: 0 %
Eosinophils Absolute: 0 10*3/uL (ref 0.0–0.5)
Eosinophils Relative: 0 %
HCT: 29 % — ABNORMAL LOW (ref 39.0–52.0)
Hemoglobin: 9 g/dL — ABNORMAL LOW (ref 13.0–17.0)
Immature Granulocytes: 0 %
Lymphocytes Relative: 9 %
Lymphs Abs: 0.6 10*3/uL — ABNORMAL LOW (ref 0.7–4.0)
MCH: 26 pg (ref 26.0–34.0)
MCHC: 31 g/dL (ref 30.0–36.0)
MCV: 83.8 fL (ref 80.0–100.0)
Monocytes Absolute: 1.3 10*3/uL — ABNORMAL HIGH (ref 0.1–1.0)
Monocytes Relative: 19 %
Neutro Abs: 4.8 10*3/uL (ref 1.7–7.7)
Neutrophils Relative %: 72 %
Platelet Count: 209 10*3/uL (ref 150–400)
RBC: 3.46 MIL/uL — ABNORMAL LOW (ref 4.22–5.81)
RDW: 17.4 % — ABNORMAL HIGH (ref 11.5–15.5)
WBC Count: 6.7 10*3/uL (ref 4.0–10.5)
nRBC: 0 % (ref 0.0–0.2)

## 2020-01-16 MED ORDER — OXYCODONE HCL 5 MG PO TABS: 5.0000 mg | ORAL_TABLET | Freq: Four times a day (QID) | ORAL | 0 refills | Status: DC | PRN

## 2020-01-16 NOTE — Telephone Encounter (Signed)
Requests refill for oxycodone. 

## 2020-01-23 ENCOUNTER — Encounter: Payer: Self-pay | Admitting: Internal Medicine

## 2020-01-23 ENCOUNTER — Inpatient Hospital Stay: Payer: Medicare Other

## 2020-01-23 ENCOUNTER — Other Ambulatory Visit: Payer: Self-pay

## 2020-01-23 ENCOUNTER — Inpatient Hospital Stay: Payer: Medicare Other | Attending: Internal Medicine

## 2020-01-23 ENCOUNTER — Inpatient Hospital Stay (HOSPITAL_BASED_OUTPATIENT_CLINIC_OR_DEPARTMENT_OTHER): Payer: Medicare Other | Admitting: Internal Medicine

## 2020-01-23 ENCOUNTER — Inpatient Hospital Stay: Payer: Medicare Other | Admitting: Nutrition

## 2020-01-23 VITALS — BP 114/71 | HR 86 | Temp 99.6°F | Resp 18 | Ht 67.0 in | Wt 177.8 lb

## 2020-01-23 DIAGNOSIS — C3491 Malignant neoplasm of unspecified part of right bronchus or lung: Secondary | ICD-10-CM

## 2020-01-23 DIAGNOSIS — C786 Secondary malignant neoplasm of retroperitoneum and peritoneum: Secondary | ICD-10-CM | POA: Insufficient documentation

## 2020-01-23 DIAGNOSIS — C787 Secondary malignant neoplasm of liver and intrahepatic bile duct: Secondary | ICD-10-CM | POA: Insufficient documentation

## 2020-01-23 DIAGNOSIS — C73 Malignant neoplasm of thyroid gland: Secondary | ICD-10-CM

## 2020-01-23 DIAGNOSIS — I2510 Atherosclerotic heart disease of native coronary artery without angina pectoris: Secondary | ICD-10-CM | POA: Diagnosis not present

## 2020-01-23 DIAGNOSIS — Z5112 Encounter for antineoplastic immunotherapy: Secondary | ICD-10-CM | POA: Diagnosis not present

## 2020-01-23 DIAGNOSIS — Z9221 Personal history of antineoplastic chemotherapy: Secondary | ICD-10-CM | POA: Diagnosis not present

## 2020-01-23 DIAGNOSIS — E279 Disorder of adrenal gland, unspecified: Secondary | ICD-10-CM | POA: Diagnosis not present

## 2020-01-23 DIAGNOSIS — G893 Neoplasm related pain (acute) (chronic): Secondary | ICD-10-CM

## 2020-01-23 DIAGNOSIS — C3411 Malignant neoplasm of upper lobe, right bronchus or lung: Secondary | ICD-10-CM | POA: Diagnosis not present

## 2020-01-23 DIAGNOSIS — Z923 Personal history of irradiation: Secondary | ICD-10-CM | POA: Insufficient documentation

## 2020-01-23 DIAGNOSIS — Z5111 Encounter for antineoplastic chemotherapy: Secondary | ICD-10-CM

## 2020-01-23 DIAGNOSIS — E785 Hyperlipidemia, unspecified: Secondary | ICD-10-CM | POA: Insufficient documentation

## 2020-01-23 DIAGNOSIS — K219 Gastro-esophageal reflux disease without esophagitis: Secondary | ICD-10-CM | POA: Diagnosis not present

## 2020-01-23 DIAGNOSIS — C3490 Malignant neoplasm of unspecified part of unspecified bronchus or lung: Secondary | ICD-10-CM

## 2020-01-23 DIAGNOSIS — Z79899 Other long term (current) drug therapy: Secondary | ICD-10-CM | POA: Insufficient documentation

## 2020-01-23 DIAGNOSIS — I10 Essential (primary) hypertension: Secondary | ICD-10-CM | POA: Diagnosis not present

## 2020-01-23 LAB — CMP (CANCER CENTER ONLY)
ALT: 19 U/L (ref 0–44)
AST: 34 U/L (ref 15–41)
Albumin: 2.8 g/dL — ABNORMAL LOW (ref 3.5–5.0)
Alkaline Phosphatase: 197 U/L — ABNORMAL HIGH (ref 38–126)
Anion gap: 11 (ref 5–15)
BUN: 10 mg/dL (ref 8–23)
CO2: 22 mmol/L (ref 22–32)
Calcium: 9.7 mg/dL (ref 8.9–10.3)
Chloride: 101 mmol/L (ref 98–111)
Creatinine: 1.07 mg/dL (ref 0.61–1.24)
GFR, Est AFR Am: >60 mL/min (ref >60)
GFR, Estimated: >60 mL/min (ref >60)
Glucose, Bld: 146 mg/dL — ABNORMAL HIGH (ref 70–99)
Potassium: 4.1 mmol/L (ref 3.5–5.1)
Sodium: 134 mmol/L — ABNORMAL LOW (ref 135–145)
Total Bilirubin: 0.5 mg/dL (ref 0.3–1.2)
Total Protein: 6.8 g/dL (ref 6.5–8.1)

## 2020-01-23 LAB — CBC WITH DIFFERENTIAL (CANCER CENTER ONLY)
Abs Immature Granulocytes: 0.1 10*3/uL — ABNORMAL HIGH (ref 0.00–0.07)
Basophils Absolute: 0 10*3/uL (ref 0.0–0.1)
Basophils Relative: 0 %
Eosinophils Absolute: 0 10*3/uL (ref 0.0–0.5)
Eosinophils Relative: 0 %
HCT: 31.1 % — ABNORMAL LOW (ref 39.0–52.0)
Hemoglobin: 9.6 g/dL — ABNORMAL LOW (ref 13.0–17.0)
Immature Granulocytes: 1 %
Lymphocytes Relative: 8 %
Lymphs Abs: 0.9 10*3/uL (ref 0.7–4.0)
MCH: 25.7 pg — ABNORMAL LOW (ref 26.0–34.0)
MCHC: 30.9 g/dL (ref 30.0–36.0)
MCV: 83.4 fL (ref 80.0–100.0)
Monocytes Absolute: 1.5 10*3/uL — ABNORMAL HIGH (ref 0.1–1.0)
Monocytes Relative: 15 %
Neutro Abs: 7.8 10*3/uL — ABNORMAL HIGH (ref 1.7–7.7)
Neutrophils Relative %: 76 %
Platelet Count: 445 10*3/uL — ABNORMAL HIGH (ref 150–400)
RBC: 3.73 MIL/uL — ABNORMAL LOW (ref 4.22–5.81)
RDW: 18.2 % — ABNORMAL HIGH (ref 11.5–15.5)
WBC Count: 10.3 10*3/uL (ref 4.0–10.5)
nRBC: 0 % (ref 0.0–0.2)

## 2020-01-23 LAB — TSH: TSH: 0.188 u[IU]/mL — ABNORMAL LOW (ref 0.320–4.118)

## 2020-01-23 MED ORDER — SODIUM CHLORIDE 0.9 % IV SOLN
150.0000 mg | Freq: Once | INTRAVENOUS | Status: AC
  Administered 2020-01-23: 150 mg via INTRAVENOUS
  Filled 2020-01-23: qty 150

## 2020-01-23 MED ORDER — SODIUM CHLORIDE 0.9 % IV SOLN
Freq: Once | INTRAVENOUS | Status: AC
  Filled 2020-01-23: qty 250

## 2020-01-23 MED ORDER — SODIUM CHLORIDE 0.9 % IV SOLN
500.0000 mg/m2 | Freq: Once | INTRAVENOUS | Status: AC
  Administered 2020-01-23: 1000 mg via INTRAVENOUS
  Filled 2020-01-23: qty 40

## 2020-01-23 MED ORDER — CYANOCOBALAMIN 1000 MCG/ML IJ SOLN
1000.0000 ug | Freq: Once | INTRAMUSCULAR | Status: AC
  Administered 2020-01-23: 1000 ug via INTRAMUSCULAR

## 2020-01-23 MED ORDER — SODIUM CHLORIDE 0.9 % IV SOLN
200.0000 mg | Freq: Once | INTRAVENOUS | Status: AC
  Administered 2020-01-23: 200 mg via INTRAVENOUS
  Filled 2020-01-23: qty 8

## 2020-01-23 MED ORDER — SODIUM CHLORIDE 0.9 % IV SOLN
505.0000 mg | Freq: Once | INTRAVENOUS | Status: AC
  Administered 2020-01-23: 510 mg via INTRAVENOUS
  Filled 2020-01-23: qty 51

## 2020-01-23 MED ORDER — CYANOCOBALAMIN 1000 MCG/ML IJ SOLN
INTRAMUSCULAR | Status: AC
  Filled 2020-01-23: qty 1

## 2020-01-23 MED ORDER — SODIUM CHLORIDE 0.9 % IV SOLN
10.0000 mg | Freq: Once | INTRAVENOUS | Status: AC
  Administered 2020-01-23: 10 mg via INTRAVENOUS
  Filled 2020-01-23: qty 10

## 2020-01-23 MED ORDER — PALONOSETRON HCL INJECTION 0.25 MG/5ML
0.2500 mg | Freq: Once | INTRAVENOUS | Status: AC
  Administered 2020-01-23: 0.25 mg via INTRAVENOUS

## 2020-01-23 MED ORDER — PALONOSETRON HCL INJECTION 0.25 MG/5ML
INTRAVENOUS | Status: AC
  Filled 2020-01-23: qty 5

## 2020-01-23 NOTE — Patient Instructions (Signed)
[facility] Discharge Instructions for Patients Receiving Chemotherapy  Today you received the following chemotherapy agents: Keytruda/Alimta/Carboplatin.  To help prevent nausea and vomiting after your treatment, we encourage you to take your nausea medication as directed.   If you develop nausea and vomiting that is not controlled by your nausea medication, call the clinic.   BELOW ARE SYMPTOMS THAT SHOULD BE REPORTED IMMEDIATELY:  *FEVER GREATER THAN 100.5 F  *CHILLS WITH OR WITHOUT FEVER  NAUSEA AND VOMITING THAT IS NOT CONTROLLED WITH YOUR NAUSEA MEDICATION  *UNUSUAL SHORTNESS OF BREATH  *UNUSUAL BRUISING OR BLEEDING  TENDERNESS IN MOUTH AND THROAT WITH OR WITHOUT PRESENCE OF ULCERS  *URINARY PROBLEMS  *BOWEL PROBLEMS  UNUSUAL RASH Items with * indicate a potential emergency and should be followed up as soon as possible.  Feel free to call the clinic should you have any questions or concerns. The clinic phone number is [phone].  Please show the [place] at check-in to the Emergency Department and triage nurse.

## 2020-01-23 NOTE — Progress Notes (Signed)
Nutrition follow-up completed with patient receiving treatment for metastatic non-small cell lung cancer. Weight continues to decrease and was documented as 177.8 pounds on September 1, decreased from 204.3 pounds June 3. This is a 26.5 pound weight loss or 13% over 3 months. Patient reports constipation and fatigue. He continues to have a poor appetite.  Nutrition diagnosis: Unintended weight loss continues.  Intervention: I reviewed strategies on increasing calories and protein and made food suggestions. Patient has not tried Lactaid dairy products yet but agrees to look into this now. Stressed importance of adequate nutrition to minimize further weight loss and loss of muscle mass. Encouraged bowel regimen.  Monitoring, evaluation, goals: Patient will work to increase calories and protein to minimize further weight loss.  Next visit: Wednesday, September 22 during infusion.  **Disclaimer: This note was dictated with voice recognition software. Similar sounding words can inadvertently be transcribed and this note may contain transcription errors which may not have been corrected upon publication of note.**

## 2020-01-23 NOTE — Progress Notes (Signed)
[place] [phone]   [phone]  OFFICE PROGRESS NOTE  [name], MD [place] [address]  DIAGNOSIS: 1) Metastatic non-small cell lung cancer, likely adenocarcinoma initially diagnosed as stage IIIA (T2b, N2, M0). Presented with right upper lobe lung mass in addition to right hilar and mediastinal lymphadenopathy diagnosed in September 2020. Showed disease progression with metastatic disease with Malignancy recurrence in the right suprahilar regionand Two hypermetabolic left eccentric mesenteric masses along the small bowel mesentery, along with 3 new hypermetabolic lesions in the liver, compatible with malignancy in June 2021.  2) history of prostate adenocarcinoma status post radiotherapy under the care of Dr. Tammi Klippel. 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  Molecular Biomarkers: [id number] Everolimus, Temsirolimus Yes 4.9% [id number] None Yes 4.7% [id number] Yes 0.2% [id number] None No 5.2% [id number] None No 5.0% [id number] None (VUS) No (VUS) 4.4% [id number] (VUS) No (VUS) 3.5% MSI-HighNOT DETECTED  PRIOR THERAPY: 1) Concurrent chemoradiation with weekly carboplatin for AUC of 2 and paclitaxel 45 mg/M2. First dose March 05, 2019. Status post 7 cycles. Last dose was given April 16, 2019 with partial response. 2)Total thyroidectomy on February 1, 27619for follicular neoplasm of the thyroid under the care of Dr. GHarlow Asa3) Consolidation immunotherapy with immunotherapy with Imfinzi 1500 mg IV every 4 weeks. Last dose 10/11/19.   CURRENT THERAPY: Systemic chemotherapy with Carboplatin for an AUC of 5, Alimta 500 mg/m2, and Keytruda 200 mg IV every 3 weeks. First dose expected on7/22/2021.   Status post 2 cycles.  INTERVAL HISTORY: Carl DENNE74y.o. male returns to the  clinic today for follow-up visit accompanied by his wife.  The patient is feeling fine today with no concerning complaints except for the intermittent abdominal pain and he currently takes oxycodone for pain management.  He denied having any current chest pain but has shortness of breath with exertion and mild cough with no hemoptysis.  He denied having any recent weight loss or night sweats.  He has no nausea, vomiting, diarrhea or constipation.  He denied having any headache or visual changes.  He is here today for evaluation before starting cycle #3 of his chemotherapy.  MEDICAL HISTORY: Past Medical History:  Diagnosis Date  . ALLERGIC RHINITIS 01/30/2007  . Arthritis    "hands" (05/29/2014)  . [place]    lung, [place]  . Constipation   . Coronary artery disease    LHC (05/29/14): pLAD 50-70, pRCA 99 (L>R collats), EF 55% >> PCI: 3.5 x 28 mm Xience Alpine DES to [employer] . Cough secondary to angiotensin converting enzyme inhibitor (ACE-I) 05/28/2013  . ED (erectile dysfunction)   . GERD 09/26/2007  . HYPERLIPIDEMIA 09/26/2007  . Hypertension   . [place]  . PLANTAR FASCIITIS, BILATERAL 07/08/2009  . Pre-diabetes   . [place]   xrt  . Unspecified hearing loss 06/16/2009   wears aides both ears    ALLERGIES:  is allergic to lisinopril.  MEDICATIONS:  Current Outpatient Medications  Medication Sig Dispense Refill  . calcium carbonate (TUMS - DOSED IN MG ELEMENTAL CALCIUM) 500 MG chewable tablet Chew 1 tablet by mouth daily.    . cetirizine (ZYRTEC) 10 MG tablet Take 1 tablet (10 mg total) by mouth daily. (Patient not taking: Reported on 10/25/2019) 30 tablet 11  . ezetimibe (ZETIA) 10 MG tablet Take  1 tablet (10 mg total) by mouth daily. 90 tablet 2  . fluticasone (FLONASE) 50 MCG/ACT nasal spray SHAKE LIQUID AND USE 1 SPRAY IN EACH NOSTRIL DAILY (Patient taking differently: Place 1 spray into both nostrils daily. ) 48 g 0  . folic acid  (FOLVITE) 1 MG tablet Take 1 tablet (1 mg total) by mouth daily. 30 tablet 2  . levothyroxine (SYNTHROID) 125 MCG tablet Take 1 tablet (125 mcg total) by mouth daily. 45 tablet 3  . losartan (COZAAR) 50 MG tablet TAKE 1 TABLET BY MOUTH DAILY 90 tablet 0  . metoprolol tartrate (LOPRESSOR) 25 MG tablet TAKE 1 TABLET BY MOUTH TWICE DAILY 180 tablet 0  . Multiple Vitamin (MULTIVITAMIN WITH MINERALS) TABS tablet Take 1 tablet by mouth daily.    . omeprazole (PRILOSEC) 40 MG capsule Take 1 capsule (40 mg total) by mouth 2 (two) times daily. 60 capsule 3  . ondansetron (ZOFRAN ODT) 8 MG disintegrating tablet Take 1 tablet (8 mg total) by mouth every 8 (eight) hours as needed for nausea or vomiting. 12 tablet 0  . oxyCODONE (ROXICODONE) 5 MG immediate release tablet Take 1 tablet (5 mg total) by mouth every 6 (six) hours as needed for severe pain. 30 tablet 0  . simvastatin (ZOCOR) 40 MG tablet TAKE 1 TABLET(40 MG) BY MOUTH DAILY AT 6 PM (Patient taking differently: Take 40 mg by mouth daily at 6 PM. ) 90 tablet 3  . STOOL SOFTENER 100 MG capsule Take 100 mg by mouth daily as needed.    . vitamin B-12 (CYANOCOBALAMIN) 50 MCG tablet Take 50 mcg by mouth daily.     No current facility-administered medications for this visit.    SURGICAL HISTORY:  Past Surgical History:  Procedure Laterality Date  . CARDIAC CATHETERIZATION  05/29/2014   Procedure: CORONARY STENT INTERVENTION;  Surgeon: Henry W Smith III, MD;  Location: [facility]ATH [facility];  Service: Cardiovascular;;  Prox RCA  . COLONOSCOPY    . CORONARY ANGIOPLASTY WITH STENT PLACEMENT  05/29/2014   "1"  . KNEE ARTHROSCOPY Right ~ 2005  . LEFT HEART CATHETERIZATION WITH CORONARY ANGIOGRAM N/A 05/29/2014   Procedure: LEFT HEART CATHETERIZATION WITH CORONARY ANGIOGRAM;  Surgeon: Henry W Smith III, MD;  Location: [facility] CATH [facility];  Service: Cardiovascular;  Laterality: N/A;  . THYROIDECTOMY N/A 06/25/2019   Procedure: TOTAL THYROIDECTOMY;  Surgeon: Gerkin, Todd, MD;   Location: [facility] ORS;  Service: General;  Laterality: N/A;  . VIDEO BRONCHOSCOPY WITH ENDOBRONCHIAL ULTRASOUND N/A 02/20/2019   Procedure: VIDEO BRONCHOSCOPY WITH ENDOBRONCHIAL ULTRASOUND;  Surgeon: Icard, Bradley L, DO;  Location: [facility] OR;  Service: Thoracic;  Laterality: N/A;    REVIEW OF SYSTEMS:  A comprehensive review of systems was negative except for: Constitutional: positive for fatigue Respiratory: positive for cough and dyspnea on exertion Gastrointestinal: positive for abdominal pain and nausea   PHYSICAL EXAMINATION: General appearance: alert, cooperative, fatigued and no distress Head: Normocephalic, without obvious abnormality, atraumatic Neck: no adenopathy, no JVD, supple, symmetrical, trachea midline and thyroid not enlarged, symmetric, no tenderness/mass/nodules Lymph nodes: Cervical, supraclavicular, and axillary nodes normal. Resp: clear to auscultation bilaterally Back: symmetric, no curvature. ROM normal. No CVA tenderness. Cardio: regular rate and rhythm, S1, S2 normal, no murmur, click, rub or gallop GI: soft, non-tender; bowel sounds normal; no masses,  no organomegaly Extremities: extremities normal, atraumatic, no cyanosis or edema  ECOG PERFORMANCE STATUS: 1 - Symptomatic but completely ambulatory  Blood pressure 114/71, pulse 86, temperature 99.6 F (37.6 C), temperature source   Tympanic, resp. rate 18, height 5' 7" (1.702 m), weight 177 lb 12.8 oz (80.6 kg), SpO2 100 %.  LABORATORY DATA: Lab Results  Component Value Date   WBC 10.3 01/23/2020   HGB 9.6 (L) 01/23/2020   HCT 31.1 (L) 01/23/2020   MCV 83.4 01/23/2020   PLT 445 (H) 01/23/2020      Chemistry      Component Value Date/Time   NA 137 01/16/2020 0944   NA 141 08/05/2016 0826   K 4.3 01/16/2020 0944   CL 103 01/16/2020 0944   CO2 26 01/16/2020 0944   BUN 11 01/16/2020 0944   BUN 14 08/05/2016 0826   CREATININE 1.04 01/16/2020 0944      Component Value Date/Time   CALCIUM 9.3 01/16/2020 0944     ALKPHOS 195 (H) 01/16/2020 0944   AST 23 01/16/2020 0944   ALT 22 01/16/2020 0944   BILITOT 0.4 01/16/2020 0944       RADIOGRAPHIC STUDIES: No results found.  ASSESSMENT AND PLAN: This is a very pleasant 74 years old white male with recently diagnosed a stage IIIa (T2b, N2, M0) non-small cell lung cancer favoring adenocarcinoma presented with right upper lobe lung mass in addition to right hilar and mediastinal lymphadenopathy diagnosed in September 2020. The patient underwent a course of concurrent chemoradiation with weekly carboplatin and paclitaxel status post 7 cycles.  He tolerated the treatment well except for fatigue and dry cough. He has partial response to this treatment.   The patient is currently undergoing consolidation treatment with immunotherapy with Imfinzi 1500 mg IV every 4 weeks status post 6 cycles.  The patient has been tolerating this treatment well but unfortunately he continues to complain of abdominal pain and recent imaging studies including a PET scan showed malignancy recurrence in the right suprahilar region with 2 hypermetabolic left eccentric mesenteric masses along the small bowel mesentery and 3 new hypermetabolic lesions in the liver compatible with malignancy.  The patient continues to have intermittent abdominal pain. His stool was positive for Hemoccult.  He was evaluated by gastroenterology and no malignancy was identified. The patient underwent ultrasound-guided core biopsy of the metastatic liver lesion and it was consistent with metastatic adenocarcinoma. He is currently undergoing systemic chemotherapy with carboplatin for AUC of 5, Alimta 500 mg/M2 and Keytruda 200 mg IV every 3 weeks status post 2 cycles.  He tolerated the first cycle of his treatment well except for fatigue and mild nausea. The patient has been tolerating this treatment fairly well with no concerning adverse effect except for fatigue. I recommended for him to proceed with cycle #3  today as planned. I will see him back for follow-up visit in 3 weeks for evaluation with repeat CT scan of the chest, abdomen pelvis for restaging of his disease. For the pain management, he will continue on oxycodone as needed. For the nausea, he is currently on Zofran. For the thyroid cancer, he underwent surgical resection and he is followed by endocrinology. The patient was advised to call immediately if she has any concerning symptoms in the interval. All questions were answered. The patient knows to call the clinic with any problems, questions or concerns. We can certainly see the patient much sooner if necessary.  Disclaimer: This note was dictated with voice recognition software. Similar sounding words can inadvertently be transcribed and may not be corrected upon review.       

## 2020-01-25 ENCOUNTER — Other Ambulatory Visit: Payer: Self-pay | Admitting: Internal Medicine

## 2020-01-25 ENCOUNTER — Other Ambulatory Visit: Payer: Self-pay | Admitting: Physician Assistant

## 2020-01-25 ENCOUNTER — Telehealth: Payer: Self-pay | Admitting: *Deleted

## 2020-01-25 DIAGNOSIS — C3491 Malignant neoplasm of unspecified part of right bronchus or lung: Secondary | ICD-10-CM

## 2020-01-25 MED ORDER — OXYCODONE HCL 5 MG PO TABS: 5.0000 mg | ORAL_TABLET | Freq: Four times a day (QID) | ORAL | 0 refills | Status: DC | PRN

## 2020-01-25 NOTE — Telephone Encounter (Signed)
He will need to reach out to his primary care physician for the urine problem.  I will refill his pain medication.

## 2020-01-25 NOTE — Telephone Encounter (Signed)
Received vm call from pt stating that he is having a problem at night urinating & when he does the stream is very slow & takes a long time.  He wonders if there are some meds he is taking that is causing this.  He also needs a refill on his oxycodone 5 mg.  Message routed to Dr Julien Nordmann.

## 2020-01-29 ENCOUNTER — Telehealth: Payer: Self-pay | Admitting: Internal Medicine

## 2020-01-29 ENCOUNTER — Telehealth: Payer: Self-pay | Admitting: Medical Oncology

## 2020-01-29 NOTE — Telephone Encounter (Signed)
Pt requests refill Oxycodone and increased dose vs frequency . " I start to get uncomfortable after 3 hours.  He has 20 tablets of oxycodone ,but he is going out of town for 2 weeks on Thursday.

## 2020-01-29 NOTE — Telephone Encounter (Signed)
Called pt per 9/7 sch msg - unable to reach pt . Left message for patient

## 2020-01-29 NOTE — Telephone Encounter (Signed)
Called pt today & left message informing  of refill on oxycodone on Friday & hope he was able to get refilled &  to call his PCP about urine problems per Dr Julien Nordmann.

## 2020-01-30 ENCOUNTER — Other Ambulatory Visit: Payer: Self-pay | Admitting: Physician Assistant

## 2020-01-30 ENCOUNTER — Inpatient Hospital Stay: Payer: Medicare Other

## 2020-01-30 ENCOUNTER — Other Ambulatory Visit: Payer: Self-pay

## 2020-01-30 DIAGNOSIS — I10 Essential (primary) hypertension: Secondary | ICD-10-CM | POA: Diagnosis not present

## 2020-01-30 DIAGNOSIS — C3411 Malignant neoplasm of upper lobe, right bronchus or lung: Secondary | ICD-10-CM | POA: Diagnosis not present

## 2020-01-30 DIAGNOSIS — E785 Hyperlipidemia, unspecified: Secondary | ICD-10-CM | POA: Diagnosis not present

## 2020-01-30 DIAGNOSIS — C3491 Malignant neoplasm of unspecified part of right bronchus or lung: Secondary | ICD-10-CM

## 2020-01-30 DIAGNOSIS — Z5111 Encounter for antineoplastic chemotherapy: Secondary | ICD-10-CM | POA: Diagnosis not present

## 2020-01-30 DIAGNOSIS — E279 Disorder of adrenal gland, unspecified: Secondary | ICD-10-CM | POA: Diagnosis not present

## 2020-01-30 DIAGNOSIS — Z923 Personal history of irradiation: Secondary | ICD-10-CM | POA: Diagnosis not present

## 2020-01-30 DIAGNOSIS — Z79899 Other long term (current) drug therapy: Secondary | ICD-10-CM | POA: Diagnosis not present

## 2020-01-30 DIAGNOSIS — C786 Secondary malignant neoplasm of retroperitoneum and peritoneum: Secondary | ICD-10-CM | POA: Diagnosis not present

## 2020-01-30 DIAGNOSIS — Z5112 Encounter for antineoplastic immunotherapy: Secondary | ICD-10-CM | POA: Diagnosis not present

## 2020-01-30 DIAGNOSIS — I2510 Atherosclerotic heart disease of native coronary artery without angina pectoris: Secondary | ICD-10-CM | POA: Diagnosis not present

## 2020-01-30 DIAGNOSIS — Z9221 Personal history of antineoplastic chemotherapy: Secondary | ICD-10-CM | POA: Diagnosis not present

## 2020-01-30 DIAGNOSIS — C787 Secondary malignant neoplasm of liver and intrahepatic bile duct: Secondary | ICD-10-CM | POA: Diagnosis not present

## 2020-01-30 DIAGNOSIS — K219 Gastro-esophageal reflux disease without esophagitis: Secondary | ICD-10-CM | POA: Diagnosis not present

## 2020-01-30 LAB — CMP (CANCER CENTER ONLY)
ALT: 23 U/L (ref 0–44)
AST: 25 U/L (ref 15–41)
Albumin: 2.6 g/dL — ABNORMAL LOW (ref 3.5–5.0)
Alkaline Phosphatase: 239 U/L — ABNORMAL HIGH (ref 38–126)
Anion gap: 11 (ref 5–15)
BUN: 18 mg/dL (ref 8–23)
CO2: 24 mmol/L (ref 22–32)
Calcium: 8.8 mg/dL — ABNORMAL LOW (ref 8.9–10.3)
Chloride: 102 mmol/L (ref 98–111)
Creatinine: 1 mg/dL (ref 0.61–1.24)
GFR, Est AFR Am: >60 mL/min (ref >60)
GFR, Estimated: >60 mL/min (ref >60)
Glucose, Bld: 111 mg/dL — ABNORMAL HIGH (ref 70–99)
Potassium: 4 mmol/L (ref 3.5–5.1)
Sodium: 137 mmol/L (ref 135–145)
Total Bilirubin: 0.5 mg/dL (ref 0.3–1.2)
Total Protein: 6.8 g/dL (ref 6.5–8.1)

## 2020-01-30 LAB — CBC WITH DIFFERENTIAL (CANCER CENTER ONLY)
Abs Immature Granulocytes: 0.01 10*3/uL (ref 0.00–0.07)
Basophils Absolute: 0 10*3/uL (ref 0.0–0.1)
Basophils Relative: 0 %
Eosinophils Absolute: 0 10*3/uL (ref 0.0–0.5)
Eosinophils Relative: 0 %
HCT: 28.3 % — ABNORMAL LOW (ref 39.0–52.0)
Hemoglobin: 8.8 g/dL — ABNORMAL LOW (ref 13.0–17.0)
Immature Granulocytes: 0 %
Lymphocytes Relative: 26 %
Lymphs Abs: 0.8 10*3/uL (ref 0.7–4.0)
MCH: 25.8 pg — ABNORMAL LOW (ref 26.0–34.0)
MCHC: 31.1 g/dL (ref 30.0–36.0)
MCV: 83 fL (ref 80.0–100.0)
Monocytes Absolute: 0.6 10*3/uL (ref 0.1–1.0)
Monocytes Relative: 18 %
Neutro Abs: 1.8 10*3/uL (ref 1.7–7.7)
Neutrophils Relative %: 56 %
Platelet Count: 335 10*3/uL (ref 150–400)
RBC: 3.41 MIL/uL — ABNORMAL LOW (ref 4.22–5.81)
RDW: 17.9 % — ABNORMAL HIGH (ref 11.5–15.5)
WBC Count: 3.2 10*3/uL — ABNORMAL LOW (ref 4.0–10.5)
nRBC: 0 % (ref 0.0–0.2)

## 2020-01-30 MED ORDER — OXYCODONE HCL 5 MG PO TABS: 5.0000 mg | ORAL_TABLET | Freq: Four times a day (QID) | ORAL | 0 refills | Status: DC | PRN

## 2020-02-05 ENCOUNTER — Other Ambulatory Visit: Payer: Self-pay | Admitting: Family Medicine

## 2020-02-05 DIAGNOSIS — J302 Other seasonal allergic rhinitis: Secondary | ICD-10-CM

## 2020-02-06 ENCOUNTER — Other Ambulatory Visit: Payer: Medicare Other

## 2020-02-11 ENCOUNTER — Ambulatory Visit (HOSPITAL_COMMUNITY): Payer: Medicare Other

## 2020-02-11 ENCOUNTER — Telehealth: Payer: Self-pay | Admitting: Medical Oncology

## 2020-02-11 NOTE — Progress Notes (Signed)
[place] OFFICE PROGRESS NOTE  [name], MD [place] [address]  DIAGNOSIS: 1) Metastatic non-small cell lung cancer, likely adenocarcinoma initially diagnosed as stage IIIA (T2b, N2, M0). Presented with right upper lobe lung mass in addition to right hilar and mediastinal lymphadenopathy diagnosed in September 2020. Showed disease progression with metastatic disease with Malignancy recurrence in the right suprahilar regionand Two hypermetabolic left eccentric mesenteric masses along the small bowel mesentery, along with 3 new hypermetabolic lesions in the liver, compatible with malignancy in June 2021.  2) history of prostate adenocarcinoma status post radiotherapy under the care of Dr. Tammi Klippel. 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  Molecular Biomarkers: [id number] Everolimus, Temsirolimus Yes 4.9% [id number] None Yes 4.7% [id number] None Yes 0.2% [id number]* None No 5.2% [id number] None No 5.0% [id number] None (VUS) No (VUS) 4.4% [id number] None (VUS) No (VUS) 3.5% MSI-HighNOT DETECTED   PRIOR THERAPY: 1) Concurrent chemoradiation with weekly carboplatin for AUC of 2 and paclitaxel 45 mg/M2. First dose March 05, 2019. Status post 7 cycles. Last dose was given April 16, 2019 with partial response. 2)Total thyroidectomy on June 24, 9733 for follicular neoplasm of the thyroid under the care of Dr. Harlow Asa 3) Consolidation immunotherapy with immunotherapy with Imfinzi 1500 mg IV every 4 weeks. Last dose 10/11/19.  4) Systemic chemotherapy with Carboplatin for an AUC of 5, Alimta 500 mg/m2, and Keytruda 200 mg IV every 3 weeks. Status post 3 cycles. First dose expected on7/22/2021.   CURRENT THERAPY: None  INTERVAL HISTORY: Carl Harrell 74 y.o. male returns to the clinic today for a follow up visit. The patient  is feeling fair today. The patient's last CT scan 3 months ago, showed 3 new hypermetabolic lesions in the liver as well as a 2 cm left eccentric mesenteric mass along the small bowel mesentery. The patient had a colonoscopy and upper endoscopy which was unremarkable.  He has been experiencing intermittent epigastric pain which he characterizes as a bloated sensation. He had a ultrasound-guided biopsy of one of the liver lesions which was consistent with adenocarcinoma, although non-specific, likely related to his lung cancer in the absence of any concerning findings on his recent colonoscopy and endoscopy.   The patient continues to have intermittent pain in the right upper quadrant.  The patient takes oxycodone every 6 hours for his pain which significantly helps his symptoms for 3 hours or so. He also is complaining of knee pain secondary to osteoarthritis.  The patient has lost an additional 5 pounds since his last appointment.  He denies any nausea, vomiting, or diarrhea.  He has constipation for which he takes stool softeners which helps alleviate his symptoms.  He denies any recent fever or night sweats.  He denies any cough or chest pain but reports mildly worsening dyspnea on exertion.  He denies any headache or visual changes.  He is here today to review his scan results before starting cycle #4.    MEDICAL HISTORY: Past Medical History:  Diagnosis Date  . ALLERGIC RHINITIS 01/30/2007  . Arthritis    "hands" (05/29/2014)  . Cancer ([place])    lung, prostate  . Constipation   . Coronary artery disease    LHC (05/29/14): pLAD 50-70, pRCA 99 (L>R collats), EF 55% >> PCI: 3.5 x 28 mm Xience Alpine DES to [employer]  . Cough secondary to angiotensin converting enzyme inhibitor (ACE-I) 05/28/2013  . ED (erectile dysfunction)   .  GERD 09/26/2007  . HYPERLIPIDEMIA 09/26/2007  . Hypertension   . Lung cancer ([place]) dx'd 03/2018  . PLANTAR FASCIITIS, BILATERAL 07/08/2009  . Pre-diabetes   . Prostate CA ([place]) dx'd  2017 or 2018   xrt  . Unspecified hearing loss 06/16/2009   wears aides both ears    ALLERGIES:  is allergic to lisinopril.  MEDICATIONS:  Current Outpatient Medications  Medication Sig Dispense Refill  . calcium carbonate (TUMS - DOSED IN MG ELEMENTAL CALCIUM) 500 MG chewable tablet Chew 1 tablet by mouth daily.    . cetirizine (ZYRTEC) 10 MG tablet TAKE 1 TABLET(10 MG) BY MOUTH DAILY 30 tablet 11  . ezetimibe (ZETIA) 10 MG tablet Take 1 tablet (10 mg total) by mouth daily. 90 tablet 2  . fluticasone (FLONASE) 50 MCG/ACT nasal spray SHAKE LIQUID AND USE 1 SPRAY IN EACH NOSTRIL DAILY 48 g 0  . folic acid (FOLVITE) 1 MG tablet Take 1 tablet (1 mg total) by mouth daily. 30 tablet 2  . levothyroxine (SYNTHROID) 100 MCG tablet Take 100 mcg by mouth daily.    Marland Kitchen losartan (COZAAR) 50 MG tablet TAKE 1 TABLET BY MOUTH DAILY 90 tablet 0  . metoprolol tartrate (LOPRESSOR) 25 MG tablet TAKE 1 TABLET BY MOUTH TWICE DAILY 180 tablet 0  . Multiple Vitamin (MULTIVITAMIN WITH MINERALS) TABS tablet Take 1 tablet by mouth daily.    Marland Kitchen omeprazole (PRILOSEC) 40 MG capsule Take 1 capsule (40 mg total) by mouth 2 (two) times daily. 60 capsule 3  . oxyCODONE (ROXICODONE) 5 MG immediate release tablet Take 1 tablet (5 mg total) by mouth every 6 (six) hours as needed for severe pain. 45 tablet 0  . simvastatin (ZOCOR) 40 MG tablet TAKE 1 TABLET(40 MG) BY MOUTH DAILY AT 6 PM (Patient taking differently: Take 40 mg by mouth daily at 6 PM. ) 90 tablet 3  . STOOL SOFTENER 100 MG capsule Take 100 mg by mouth daily as needed.    . vitamin B-12 (CYANOCOBALAMIN) 50 MCG tablet Take 50 mcg by mouth daily.    . ondansetron (ZOFRAN ODT) 8 MG disintegrating tablet Take 1 tablet (8 mg total) by mouth every 8 (eight) hours as needed for nausea or vomiting. (Patient not taking: Reported on 02/13/2020) 12 tablet 0   No current facility-administered medications for this visit.    SURGICAL HISTORY:  Past Surgical History:   Procedure Laterality Date  . CARDIAC CATHETERIZATION  05/29/2014   Procedure: CORONARY STENT INTERVENTION;  Surgeon: Sinclair Grooms, MD;  Location[facility] CATH LAB;  Service: Cardiovascular;;  Prox RCA  . COLONOSCOPY    . CORONARY ANGIOPLASTY WITH STENT PLACEMENT  05/29/2014   "1"  . KNEE ARTHROSCOPY Right ~ 2005  . LEFT HEART CATHETERIZATION WITH CORONARY ANGIOGRAM N/A 05/29/2014   Procedure: LEFT HEART CATHETERIZATION WITH CORONARY ANGIOGRAM;  Surgeon: Sinclair Grooms, MD;  Location: [facility] CATH LAB;  Service: Cardiovascular;  Laterality: N/A;  . THYROIDECTOMY N/A 06/25/2019   Procedure: TOTAL THYROIDECTOMY;  Surgeon: Armandina Gemma, MD;  Location: [facility] ORS;  Service: General;  Laterality: N/A;  . VIDEO BRONCHOSCOPY WITH ENDOBRONCHIAL ULTRASOUND N/A 02/20/2019   Procedure: VIDEO BRONCHOSCOPY WITH ENDOBRONCHIAL ULTRASOUND;  Surgeon: Garner Nash, DO;  Location: [place];  Service: Thoracic;  Laterality: N/A;    REVIEW OF SYSTEMS:   Review of Systems  Constitutional: Positive for fatigue, appetite change, and weight loss.  Negative for chills and fever.  HENT: Negative for mouth sores, nosebleeds, sore throat and  trouble swallowing.   Eyes: Negative for eye problems and icterus.  Respiratory: Positive for dyspnea on exertion.  Negative for cough, hemoptysis, and wheezing.   Cardiovascular: Negative for chest pain and leg swelling.  Gastrointestinal: Positive for right upper quadrant pain and intermittent constipation.  Negative for diarrhea, nausea and vomiting.  Genitourinary: Negative for bladder incontinence, difficulty urinating, dysuria, frequency and hematuria.   Musculoskeletal: Negative for back pain, gait problem, neck pain and neck stiffness.  Skin: Negative for itching and rash.  Neurological: Negative for dizziness, extremity weakness, gait problem, headaches, light-headedness and seizures.  Hematological: Negative for adenopathy. Does not bruise/bleed easily.  Psychiatric/Behavioral:  Negative for confusion, depression and sleep disturbance. The patient is not nervous/anxious.     PHYSICAL EXAMINATION:  Blood pressure 118/79, pulse (!) 112, temperature 97.9 F (36.6 C), temperature source Tympanic, resp. rate 17, height $RemoveBe'5\' 7"'CGYANbVtR$  (1.702 m), weight 172 lb 9.6 oz (78.3 kg), SpO2 100 %.  ECOG PERFORMANCE STATUS: 1 - Symptomatic but completely ambulatory  Physical Exam  Constitutional: Oriented to person, place, and time and well-developed, well-nourished, and in no distress. HENT:  Head: Normocephalic and atraumatic.  Mouth/Throat: Oropharynx is clear and moist. No oropharyngeal exudate.  Eyes: Conjunctivae are normal. Right eye exhibits no discharge. Left eye exhibits no discharge. No scleral icterus.  Neck: Normal range of motion. Neck supple.  Cardiovascular: Normal rate, regular rhythm, normal heart sounds and intact distal pulses.   Pulmonary/Chest: Effort normal and breath sounds normal. No respiratory distress. No wheezes. No rales.  Abdominal: Soft. Bowel sounds are normal. Exhibits no distension and no mass. There is no tenderness.  Musculoskeletal: Normal range of motion. Exhibits no edema.  Lymphadenopathy:    No cervical adenopathy.  Neurological: Alert and oriented to person, place, and time. Exhibits normal muscle tone. Gait normal. Coordination normal.  Skin: Skin is warm and dry. No rash noted. Not diaphoretic. No erythema. No pallor.  Psychiatric: Mood, memory and judgment normal.  Vitals reviewed.  LABORATORY DATA: Lab Results  Component Value Date   WBC 6.4 02/13/2020   HGB 9.2 (L) 02/13/2020   HCT 30.0 (L) 02/13/2020   MCV 84.7 02/13/2020   PLT 366 02/13/2020      Chemistry      Component Value Date/Time   NA 139 02/13/2020 0806   NA 141 08/05/2016 0826   K 3.9 02/13/2020 0806   CL 104 02/13/2020 0806   CO2 25 02/13/2020 0806   BUN 11 02/13/2020 0806   BUN 14 08/05/2016 0826   CREATININE 1.00 02/13/2020 0806      Component Value  Date/Time   CALCIUM 8.9 02/13/2020 0806   ALKPHOS 192 (H) 02/13/2020 0806   AST 32 02/13/2020 0806   ALT 22 02/13/2020 0806   BILITOT 0.6 02/13/2020 0806       RADIOGRAPHIC STUDIES:  CT Chest W Contrast  Result Date: 02/12/2020 CLINICAL DATA:  Non-small-cell lung cancer.  Restaging. EXAM: CT CHEST, ABDOMEN, AND PELVIS WITH CONTRAST TECHNIQUE: Multidetector CT imaging of the chest, abdomen and pelvis was performed following the standard protocol during bolus administration of intravenous contrast. CONTRAST:  121mL OMNIPAQUE IOHEXOL 300 MG/ML  SOLN COMPARISON:  PET-CT 11/08/2019. Diagnostic chest abdomen pelvis CT 11/05/2019. FINDINGS: CT CHEST FINDINGS Cardiovascular: The heart size is normal. No substantial pericardial effusion. Coronary artery calcification is evident. Atherosclerotic calcification is noted in the wall of the thoracic aorta. Mediastinum/Nodes: No mediastinal lymphadenopathy. There is no hilar lymphadenopathy. The esophagus has normal imaging features. There is no axillary  lymphadenopathy. Lungs/Pleura: Right suprahilar lesion has progressed in the interval measuring 4.0 x 3.9 cm today compared to 2.9 x 2.5 cm on the 11/05/2019 exam. This lesion extends into the cranial aspect of the right hilum. Volume loss and adjacent architectural distortion/scarring is compatible with treatment change. No new suspicious pulmonary nodule or mass. No pulmonary edema. No pleural effusion. Musculoskeletal: No worrisome lytic or sclerotic osseous abnormality. CT ABDOMEN PELVIS FINDINGS Hepatobiliary: Interval development of innumerable liver metastases. Dominant lesion in the inferior right liver measures 5.1 x 5.1 cm. Another index lesion near the junction of segment IV and segment V measures 4.4 x 3.8 cm on 59/2. a third index lesion in segment II measures 2.6 x 2.2 cm. Tiny calcified gallstone evident. No intrahepatic or extrahepatic biliary dilation. Pancreas: No focal mass lesion. No dilatation of  the main duct. No intraparenchymal cyst. No peripancreatic edema. Spleen: No splenomegaly. No focal mass lesion. Adrenals/Urinary Tract: 3.0 x 2.8 cm left adrenal mass is new in the interval. 1.8 cm right adrenal nodule is new since prior imaging. Right kidney unremarkable. Posterior left renal cyst is stable No evidence for hydroureter. The urinary bladder appears normal for the degree of distention. Stomach/Bowel: Stomach is unremarkable. No gastric wall thickening. No evidence of outlet obstruction. Duodenum is normally positioned as is the ligament of Treitz. No small bowel wall thickening. No small bowel dilatation. The terminal ileum is normal. The appendix is normal. No gross colonic mass. No colonic wall thickening. Diverticular changes are noted in the left colon without evidence of diverticulitis. Vascular/Lymphatic: There is abdominal aortic atherosclerosis without aneurysm. 1.4 cm short axis portal caval lymph node is new in the interval and may have central necrosis. No para-aortic lymphadenopathy. No pelvic sidewall lymphadenopathy. Reproductive: Fiducial markers noted in the prostate gland. Other: Left mesenteric lesion measures 7.2 x 4.1 cm today, increased from 6.1 x 4.3 cm (remeasured) previously. New mesenteric soft tissue nodules are visible on images 73 and 77, consistent with new sites of mesenteric involvement. Musculoskeletal: No worrisome lytic or sclerotic osseous abnormality. IMPRESSION: 1. Interval progression of disease. 2. The right suprahilar lesion has enlarged in the interval since prior imaging, now measuring up to 4 cm. 3. Innumerable new liver metastases evident. Patient was noted to have 3 hypermetabolic liver lesions on the PET-CT of 11/05/2019. 4. New left adrenal mass with new left adrenal nodule, consistent with metastatic involvement. 5. Progression of dominant left mesenteric necrotic metastatic lesion with interval development of additional small mesenteric soft tissue  nodules consistent with new sites of mesenteric involvement. 6. Interval development of a 1.4 cm short axis portal caval lymph node consistent with metastatic disease. 7. Cholelithiasis. 8. Left colonic diverticulosis without diverticulitis. 9. Aortic Atherosclerosis (ICD10-I70.0). Electronically Signed   By: Misty Stanley M.D.   On: 02/12/2020 12:46   CT Abdomen Pelvis W Contrast  Result Date: 02/12/2020 CLINICAL DATA:  Non-small-cell lung cancer.  Restaging. EXAM: CT CHEST, ABDOMEN, AND PELVIS WITH CONTRAST TECHNIQUE: Multidetector CT imaging of the chest, abdomen and pelvis was performed following the standard protocol during bolus administration of intravenous contrast. CONTRAST:  117mL OMNIPAQUE IOHEXOL 300 MG/ML  SOLN COMPARISON:  PET-CT 11/08/2019. Diagnostic chest abdomen pelvis CT 11/05/2019. FINDINGS: CT CHEST FINDINGS Cardiovascular: The heart size is normal. No substantial pericardial effusion. Coronary artery calcification is evident. Atherosclerotic calcification is noted in the wall of the thoracic aorta. Mediastinum/Nodes: No mediastinal lymphadenopathy. There is no hilar lymphadenopathy. The esophagus has normal imaging features. There is no axillary lymphadenopathy. Lungs/Pleura:  Right suprahilar lesion has progressed in the interval measuring 4.0 x 3.9 cm today compared to 2.9 x 2.5 cm on the 11/05/2019 exam. This lesion extends into the cranial aspect of the right hilum. Volume loss and adjacent architectural distortion/scarring is compatible with treatment change. No new suspicious pulmonary nodule or mass. No pulmonary edema. No pleural effusion. Musculoskeletal: No worrisome lytic or sclerotic osseous abnormality. CT ABDOMEN PELVIS FINDINGS Hepatobiliary: Interval development of innumerable liver metastases. Dominant lesion in the inferior right liver measures 5.1 x 5.1 cm. Another index lesion near the junction of segment IV and segment V measures 4.4 x 3.8 cm on 59/2. a third index  lesion in segment II measures 2.6 x 2.2 cm. Tiny calcified gallstone evident. No intrahepatic or extrahepatic biliary dilation. Pancreas: No focal mass lesion. No dilatation of the main duct. No intraparenchymal cyst. No peripancreatic edema. Spleen: No splenomegaly. No focal mass lesion. Adrenals/Urinary Tract: 3.0 x 2.8 cm left adrenal mass is new in the interval. 1.8 cm right adrenal nodule is new since prior imaging. Right kidney unremarkable. Posterior left renal cyst is stable No evidence for hydroureter. The urinary bladder appears normal for the degree of distention. Stomach/Bowel: Stomach is unremarkable. No gastric wall thickening. No evidence of outlet obstruction. Duodenum is normally positioned as is the ligament of Treitz. No small bowel wall thickening. No small bowel dilatation. The terminal ileum is normal. The appendix is normal. No gross colonic mass. No colonic wall thickening. Diverticular changes are noted in the left colon without evidence of diverticulitis. Vascular/Lymphatic: There is abdominal aortic atherosclerosis without aneurysm. 1.4 cm short axis portal caval lymph node is new in the interval and may have central necrosis. No para-aortic lymphadenopathy. No pelvic sidewall lymphadenopathy. Reproductive: Fiducial markers noted in the prostate gland. Other: Left mesenteric lesion measures 7.2 x 4.1 cm today, increased from 6.1 x 4.3 cm (remeasured) previously. New mesenteric soft tissue nodules are visible on images 73 and 77, consistent with new sites of mesenteric involvement. Musculoskeletal: No worrisome lytic or sclerotic osseous abnormality. IMPRESSION: 1. Interval progression of disease. 2. The right suprahilar lesion has enlarged in the interval since prior imaging, now measuring up to 4 cm. 3. Innumerable new liver metastases evident. Patient was noted to have 3 hypermetabolic liver lesions on the PET-CT of 11/05/2019. 4. New left adrenal mass with new left adrenal nodule,  consistent with metastatic involvement. 5. Progression of dominant left mesenteric necrotic metastatic lesion with interval development of additional small mesenteric soft tissue nodules consistent with new sites of mesenteric involvement. 6. Interval development of a 1.4 cm short axis portal caval lymph node consistent with metastatic disease. 7. Cholelithiasis. 8. Left colonic diverticulosis without diverticulitis. 9. Aortic Atherosclerosis (ICD10-I70.0). Electronically Signed   By: Misty Stanley M.D.   On: 02/12/2020 12:46     ASSESSMENT/PLAN:  This is a very pleasant 74 year old Caucasian male diagnosed with metastatic non-small cell lung cancer, likely adenocarcinoma initially diagnosed as stage IIIA (T2b, N2, M0). Presented with right upper lobe lung mass in addition to right hilar and mediastinal lymphadenopathy diagnosed in September 2020. Showed disease progression with metastatic diseaserecurrence in the right suprahilar regionand two hypermetabolic left eccentric mesenteric masses along the small bowel mesentery, along with 3 new hypermetabolic lesions in the liver, compatible with malignancy in June 2021. The patient does not have any actionable mutations.  The patient completed a course of concurrent chemoradiation with weekly carboplatin for an AUC of 2 and paclitaxel 45 mg/m. He status post  7 cycles. He tolerated treatment well except for fatigue and a dry cough. He had a partial response to treatment.  The patient then underwent consolidation immunotherapy with Imfinzi 1500 mg IV every 4 weeks. The patient is status post 6 cycles. He showed evidence of disease progression with new local disease recurrence as well as metastatic disease to the liver and a suspicious mass along the small bowel mesentery. He was recently referred to gastroenterology to further evaluate this lesion.No masses were appreciated on colonoscopy/endoscopy.  The patient recently had a biopsy of the liver  lesion which was consistent with adenocarcinoma, Dr. Julien Nordmann believes this to be related to his lung cancer.   The patient is currently undergoing systemic chemotherapy with carboplatin for an AUC of 5, Alimta 500 mg per metered squared, Keytruda 200 mg IV every 3 weeks.  He is status post 3 cycles.   The patient recently had a restaging CT scan performed. Dr. Julien Nordmann personally and independently reviewed the scan and discussed the results with the patient. The scan showed evidence for disease progression with an enlarging right suprahilar lesion as well as innumerable liver metastases. There is also progression of the dominant left mesenteric necrotic metastatic lesion.   Dr. Julien Nordmann had a lengthy discussion today with the patient about his current condition and treatment options.  Dr. Julien Nordmann encouraged the patient to consider palliative care/hospice care.  The patient is not interested in hospice at this time.  Dr. Julien Nordmann also discussed that he could change his treatment to second line treatment with docetaxel and Cyramza.  Dr. Julien Nordmann discussed side effects of this treatment and that this treatment would have more side effects compared to his prior treatments.  Dr. Julien Nordmann also offered a referral to [facility] or any other institution of the patient's choice for a second opinion.  The patient would like to consider his options and discuss these options with his family.  He was instructed to call us once he makes a decision so arrangements can be made.  Discussed that it takes approximately 1 week to arrange for second line chemotherapy.  We will refill the patient's pain medication today.  We have increase his dose of oxycodone to 10 mg every 6-8 hours as needed for cancer related pain.  We will not make any follow-up appointments at this time until the patient calls [place] with his final decision.  The patient was advised to call immediately if he has any concerning symptoms in the interval. The patient  voices understanding of current disease status and treatment options and is in agreement with the current care plan. All questions were answered. The patient knows to call the clinic with any problems, questions or concerns. We can certainly see the patient much sooner if necessary  No orders of the defined types were placed in this encounter.    Aneesh Faller L Delecia Vastine, PA-C 02/13/20  ADDENDUM: Hematology/Oncology Attending: I had a face-to-face encounter with the patient today.  I recommended his care plan.  This is a very pleasant 74 years old white male with metastatic non-small cell lung cancer that was initially diagnosed as a stage IIIa adenocarcinoma presented with right upper lobe lung mass in addition to right hilar and mediastinal lymphadenopathy in September 2020.  The patient underwent a course of concurrent chemoradiation with weekly carboplatin and paclitaxel for 7 cycles with partial response.  He was also treated with consolidation immunotherapy with Imfinzi for 6 cycles but unfortunately he had evidence for disease progression in the lung, abdomen  and liver.  The patient started systemic chemotherapy with carboplatin, Alimta and Keytruda status post 3 cycles.  He was complaining of increasing fatigue and weakness with this treatment. Repeat CT scan of the chest, abdomen pelvis showed evidence for worsening of his disease and progression in the lung, liver as well as the abdomen. I had a lengthy discussion with the patient today about his current condition and treatment options.  I strongly recommend for the patient to consider palliative care and hospice but he is not interested in this option.  I gave him the option of systemic chemotherapy with second line treatment with docetaxel 75 mg/M2 and Cyramza 10 mg/KG every 3 weeks with Neulasta support.  I also gave him the option of a second opinion at [facility] with Dr. Durenda Hurt. The patient would like to discuss this option  with his wife before making a final decision. He will call in the next few days once he make a decision. For pain management we will increase his oxycodone to 10 mg p.o. every 8 hours as needed for pain. He was advised to call immediately if he has any concerning symptoms in the interval.  Disclaimer: This note was dictated with voice recognition software. Similar sounding words can inadvertently be transcribed and may be missed upon review. Eilleen Kempf, MD 02/13/20

## 2020-02-11 NOTE — Telephone Encounter (Signed)
"   I can hardly walk because my L knee is swollen. X 2-3 weeks". No falls.  "I took Ibuprofen 400 mg and it is the only thing that helps.I told him he can take another dose of Motrin tonight.  CT scan-missed it today .  I told him  reschedule his CT scan for tomorrow.

## 2020-02-11 NOTE — Telephone Encounter (Signed)
Please reschedule his appointment after the scan.

## 2020-02-12 ENCOUNTER — Encounter (HOSPITAL_COMMUNITY): Payer: Self-pay

## 2020-02-12 ENCOUNTER — Other Ambulatory Visit: Payer: Self-pay

## 2020-02-12 ENCOUNTER — Ambulatory Visit (HOSPITAL_COMMUNITY)
Admission: RE | Admit: 2020-02-12 | Discharge: 2020-02-12 | Disposition: A | Discharge status: Home / Self Care | Payer: Medicare Other | Source: Ambulatory Visit | Attending: Internal Medicine | Admitting: Internal Medicine

## 2020-02-12 DIAGNOSIS — K5730 Diverticulosis of large intestine without perforation or abscess without bleeding: Secondary | ICD-10-CM | POA: Diagnosis not present

## 2020-02-12 DIAGNOSIS — K8080 Other cholelithiasis without obstruction: Secondary | ICD-10-CM | POA: Diagnosis not present

## 2020-02-12 DIAGNOSIS — I700 Atherosclerosis of aorta: Secondary | ICD-10-CM | POA: Diagnosis not present

## 2020-02-12 DIAGNOSIS — K8020 Calculus of gallbladder without cholecystitis without obstruction: Secondary | ICD-10-CM | POA: Diagnosis not present

## 2020-02-12 DIAGNOSIS — C3490 Malignant neoplasm of unspecified part of unspecified bronchus or lung: Secondary | ICD-10-CM | POA: Insufficient documentation

## 2020-02-12 DIAGNOSIS — K769 Liver disease, unspecified: Secondary | ICD-10-CM | POA: Diagnosis not present

## 2020-02-12 MED ORDER — IOHEXOL 300 MG/ML  SOLN
100.0000 mL | Freq: Once | INTRAMUSCULAR | Status: AC | PRN
  Administered 2020-02-12: 100 mL via INTRAVENOUS

## 2020-02-13 ENCOUNTER — Encounter: Payer: Medicare Other | Admitting: Nutrition

## 2020-02-13 ENCOUNTER — Inpatient Hospital Stay: Payer: Medicare Other | Admitting: Nutrition

## 2020-02-13 ENCOUNTER — Inpatient Hospital Stay: Payer: Medicare Other

## 2020-02-13 ENCOUNTER — Other Ambulatory Visit: Payer: Self-pay

## 2020-02-13 ENCOUNTER — Inpatient Hospital Stay: Payer: Medicare Other | Admitting: Physician Assistant

## 2020-02-13 ENCOUNTER — Encounter: Payer: Self-pay | Admitting: Physician Assistant

## 2020-02-13 VITALS — BP 118/79 | HR 112 | Temp 97.9°F | Resp 17 | Ht 67.0 in | Wt 172.6 lb

## 2020-02-13 DIAGNOSIS — I2510 Atherosclerotic heart disease of native coronary artery without angina pectoris: Secondary | ICD-10-CM | POA: Diagnosis not present

## 2020-02-13 DIAGNOSIS — Z7189 Other specified counseling: Secondary | ICD-10-CM

## 2020-02-13 DIAGNOSIS — G893 Neoplasm related pain (acute) (chronic): Secondary | ICD-10-CM

## 2020-02-13 DIAGNOSIS — Z923 Personal history of irradiation: Secondary | ICD-10-CM | POA: Diagnosis not present

## 2020-02-13 DIAGNOSIS — C3491 Malignant neoplasm of unspecified part of right bronchus or lung: Secondary | ICD-10-CM

## 2020-02-13 DIAGNOSIS — Z5111 Encounter for antineoplastic chemotherapy: Secondary | ICD-10-CM | POA: Diagnosis not present

## 2020-02-13 DIAGNOSIS — I10 Essential (primary) hypertension: Secondary | ICD-10-CM | POA: Diagnosis not present

## 2020-02-13 DIAGNOSIS — C786 Secondary malignant neoplasm of retroperitoneum and peritoneum: Secondary | ICD-10-CM | POA: Diagnosis not present

## 2020-02-13 DIAGNOSIS — Z5112 Encounter for antineoplastic immunotherapy: Secondary | ICD-10-CM | POA: Diagnosis not present

## 2020-02-13 DIAGNOSIS — K219 Gastro-esophageal reflux disease without esophagitis: Secondary | ICD-10-CM | POA: Diagnosis not present

## 2020-02-13 DIAGNOSIS — C3411 Malignant neoplasm of upper lobe, right bronchus or lung: Secondary | ICD-10-CM | POA: Diagnosis not present

## 2020-02-13 DIAGNOSIS — E279 Disorder of adrenal gland, unspecified: Secondary | ICD-10-CM | POA: Diagnosis not present

## 2020-02-13 DIAGNOSIS — Z79899 Other long term (current) drug therapy: Secondary | ICD-10-CM | POA: Diagnosis not present

## 2020-02-13 DIAGNOSIS — E785 Hyperlipidemia, unspecified: Secondary | ICD-10-CM | POA: Diagnosis not present

## 2020-02-13 DIAGNOSIS — Z9221 Personal history of antineoplastic chemotherapy: Secondary | ICD-10-CM | POA: Diagnosis not present

## 2020-02-13 DIAGNOSIS — C787 Secondary malignant neoplasm of liver and intrahepatic bile duct: Secondary | ICD-10-CM | POA: Diagnosis not present

## 2020-02-13 LAB — CBC WITH DIFFERENTIAL (CANCER CENTER ONLY)
Abs Immature Granulocytes: 0.05 10*3/uL (ref 0.00–0.07)
Basophils Absolute: 0 10*3/uL (ref 0.0–0.1)
Basophils Relative: 0 %
Eosinophils Absolute: 0 10*3/uL (ref 0.0–0.5)
Eosinophils Relative: 1 %
HCT: 30 % — ABNORMAL LOW (ref 39.0–52.0)
Hemoglobin: 9.2 g/dL — ABNORMAL LOW (ref 13.0–17.0)
Immature Granulocytes: 1 %
Lymphocytes Relative: 13 %
Lymphs Abs: 0.8 10*3/uL (ref 0.7–4.0)
MCH: 26 pg (ref 26.0–34.0)
MCHC: 30.7 g/dL (ref 30.0–36.0)
MCV: 84.7 fL (ref 80.0–100.0)
Monocytes Absolute: 0.9 10*3/uL (ref 0.1–1.0)
Monocytes Relative: 15 %
Neutro Abs: 4.6 10*3/uL (ref 1.7–7.7)
Neutrophils Relative %: 70 %
Platelet Count: 366 10*3/uL (ref 150–400)
RBC: 3.54 MIL/uL — ABNORMAL LOW (ref 4.22–5.81)
RDW: 20.8 % — ABNORMAL HIGH (ref 11.5–15.5)
WBC Count: 6.4 10*3/uL (ref 4.0–10.5)
nRBC: 0 % (ref 0.0–0.2)

## 2020-02-13 LAB — TSH: TSH: 1.979 u[IU]/mL (ref 0.320–4.118)

## 2020-02-13 LAB — CMP (CANCER CENTER ONLY)
ALT: 22 U/L (ref 0–44)
AST: 32 U/L (ref 15–41)
Albumin: 2.7 g/dL — ABNORMAL LOW (ref 3.5–5.0)
Alkaline Phosphatase: 192 U/L — ABNORMAL HIGH (ref 38–126)
Anion gap: 10 (ref 5–15)
BUN: 11 mg/dL (ref 8–23)
CO2: 25 mmol/L (ref 22–32)
Calcium: 8.9 mg/dL (ref 8.9–10.3)
Chloride: 104 mmol/L (ref 98–111)
Creatinine: 1 mg/dL (ref 0.61–1.24)
GFR, Est AFR Am: >60 mL/min (ref >60)
GFR, Estimated: >60 mL/min (ref >60)
Glucose, Bld: 137 mg/dL — ABNORMAL HIGH (ref 70–99)
Potassium: 3.9 mmol/L (ref 3.5–5.1)
Sodium: 139 mmol/L (ref 135–145)
Total Bilirubin: 0.6 mg/dL (ref 0.3–1.2)
Total Protein: 6.8 g/dL (ref 6.5–8.1)

## 2020-02-13 MED ORDER — OXYCODONE HCL 10 MG PO TABS: 10.0000 mg | ORAL_TABLET | Freq: Three times a day (TID) | ORAL | 0 refills | Status: DC | PRN

## 2020-02-13 NOTE — Patient Instructions (Signed)
-  We covered a lot of important information at your appointment today regarding what the treatment plan is moving forward. Here are the the main points that were discussed at your office visit with [place] today:  -The treatment that Dr. Julien Nordmann would recommend would consists of two chemotherapy drugs, called Docetaxel and Cyramza (Ramucirumab) -Call us if you would like to pursue this. It takes about 1 week to get everything approved through insurance. [phone]). This treatment is tougher than the last treatment so that is a consideration.  -Your treatment would be given once every 3 weeks. We would check your labs once a week just to make sure that important components of your blood are in an acceptable range -You would receive an injection about 3 days after chemotherapy. The purpose of this injection is to boost your bodies infection fighting cells to protect you from getting an infection.  -We will get a CT scan after 3 treatments to check on the progress of treatment  Medications:  -I would need to sent a few important medication prescriptions to your pharmacy if you decided to do the infusion.  -Compazine. This medication is for nausea. You may take this every 6 hours as needed if you feel nausous.  - Decadron (Dexamethasone) to your pharmacy. This medication is a steroids. The purpose of this medication is because it is one if the pre-medications that is needed before you get treatment. You will need to take Two Tablets Twice a day (for a total of 4 tablets a day) the day before, the day of, and the day after chemotherapy. This means that you will take 12 tablets total over those 3 consecutive days.   Other options: -We would be happy to arrange for a second opinion at [facility] or any institution of your choice -The other option is to pursue hospice. They would come to your help and manage symptoms.   Side effects: The side effects of treatment may include but are not limited to alopecia (losing  your hair), myelosuppression (dropping your blood counts), nausea and vomiting, peripheral neuropathy (numbness in the hands and feet), liver or renal dysfunction as well as the adverse effect of Cyramza including increased risk for hemorrhage (bleeding) and GI perforation. Please seek medical attention if you develop concerning or significant bleeding.   Follow up:  -Please call us with your decision because we would need to make arrangements for whatever route you decide. Please discuss with your family.

## 2020-02-20 ENCOUNTER — Telehealth: Payer: Self-pay | Admitting: *Deleted

## 2020-02-20 ENCOUNTER — Other Ambulatory Visit: Payer: Self-pay | Admitting: Physician Assistant

## 2020-02-20 ENCOUNTER — Other Ambulatory Visit: Payer: Medicare Other

## 2020-02-20 DIAGNOSIS — C3491 Malignant neoplasm of unspecified part of right bronchus or lung: Secondary | ICD-10-CM

## 2020-02-20 MED ORDER — DEXAMETHASONE 4 MG PO TABS: ORAL_TABLET | ORAL | 2 refills | Status: AC

## 2020-02-20 MED ORDER — PROCHLORPERAZINE MALEATE 10 MG PO TABS: 10.0000 mg | ORAL_TABLET | Freq: Four times a day (QID) | ORAL | 2 refills | Status: AC | PRN

## 2020-02-20 NOTE — Telephone Encounter (Signed)
Spoke with Carl Harrell. He states he wants to try the chemo every 3 weeks. Is having small amount of blood in sputum in am, more SOB. Is having worsening knee pain since starting chemo, difficulty walking: is going to call PCP for possible ortho referral.

## 2020-02-21 ENCOUNTER — Other Ambulatory Visit: Payer: Self-pay | Admitting: Internal Medicine

## 2020-02-21 ENCOUNTER — Telehealth: Payer: Self-pay | Admitting: *Deleted

## 2020-02-21 ENCOUNTER — Other Ambulatory Visit: Payer: Self-pay

## 2020-02-21 DIAGNOSIS — C3491 Malignant neoplasm of unspecified part of right bronchus or lung: Secondary | ICD-10-CM

## 2020-02-21 NOTE — Telephone Encounter (Signed)
On behalf of Cassie's request called and spoke to patient.  No appointment for his treatment has been made as of yet.  I explained the reasoning for this medication and how important it was to how well he tolerated the upcoming treatment.  We went over the instructions for the Decadron the day before, day of and day after chemotherapy.  Advised to call office if he doesn't have an appt scheduled by Monday morning at the latest.  Patient stated understanding.

## 2020-02-21 NOTE — Progress Notes (Signed)
ON PATHWAY REGIMEN - Non-Small Cell Lung  No Change  Continue With Treatment as Ordered.  Original Decision Date/Time: 12/03/2019 15:21     A cycle is every 21 days:     Pembrolizumab      Pemetrexed      Carboplatin   **Always confirm dose/schedule in your pharmacy ordering system**  Patient Characteristics: Stage IV Metastatic, Nonsquamous, Initial Chemotherapy/Immunotherapy, PS = 0, 1, ALK Rearrangement Negative and ROS1 Rearrangement Negative and NTRK Gene Fusion?Negative and RET Gene Fusion?Negative and EGFR Mutation Negative/Non?Sensitizing, PD-L1  Expression Positive 1-49% (TPS) / Negative / Not Tested / Awaiting Test Results and Immunotherapy Candidate Therapeutic Status: Stage IV Metastatic Histology: Nonsquamous Cell ROS1 Rearrangement Status: Negative Other Mutations/Biomarkers: No Other Actionable Mutations NTRK Gene Fusion Status: Negative PD-L1 Expression Status: Quantity Not Sufficient Chemotherapy/Immunotherapy LOT: Initial Chemotherapy/Immunotherapy Molecular Targeted Therapy: Not Appropriate MET Exon 14 Mutation Status: Negative RET Gene Fusion Status: Negative ALK Rearrangement Status: Negative EGFR Mutation Status: Negative/Wild Type BRAF V600E Mutation Status: Negative ECOG Performance Status: 1 Biomarker Assessment Status Confirmation: All Genomic Markers Negative or Only MET+ or BRAF+ Immunotherapy Candidate Status: Candidate for Immunotherapy Intent of Therapy: Non-Curative / Palliative Intent, Discussed with Patient

## 2020-02-21 NOTE — Progress Notes (Signed)
DISCONTINUE ON PATHWAY REGIMEN - Non-Small Cell Lung     A cycle is every 21 days:     Pembrolizumab      Pemetrexed      Carboplatin   **Always confirm dose/schedule in your pharmacy ordering system**  REASON: Disease Progression PRIOR TREATMENT: [id number]: Pembrolizumab 200 mg + Pemetrexed 500 mg/m2 + Carboplatin AUC=5 q21 Days x 4 Cycles TREATMENT RESPONSE: Progressive Disease (PD)  START ON PATHWAY REGIMEN - Non-Small Cell Lung     A cycle is every 21 days:     Ramucirumab      Docetaxel   **Always confirm dose/schedule in your pharmacy ordering system**  Patient Characteristics: Stage IV Metastatic, Nonsquamous, Second Line - Chemotherapy/Immunotherapy, PS = 0, 1, No Prior PD-1/PD-L1  Inhibitor or Prior PD-1/PD-L1 Inhibitor + Chemotherapy, and Not a Candidate for Immunotherapy Therapeutic Status: Stage IV Metastatic Histology: Nonsquamous Cell ROS1 Rearrangement Status: Negative Other Mutations/Biomarkers: No Other Actionable Mutations Chemotherapy/Immunotherapy LOT: Second Line Chemotherapy/Immunotherapy Molecular Targeted Therapy: Not Appropriate KRAS G12C Mutation Status: Negative MET Exon 14 Mutation Status: Negative RET Gene Fusion Status: Negative EGFR Mutation Status: Negative/Wild Type NTRK Gene Fusion Status: Negative PD-L1 Expression Status: Quantity Not Sufficient ALK Rearrangement Status: Negative BRAF V600E Mutation Status: Negative ECOG Performance Status: 1 Immunotherapy Candidate Status: Not a Candidate for Immunotherapy Prior Immunotherapy Status: Prior PD-1/PD-L1 Inhibitor + Chemotherapy Intent of Therapy: Non-Curative / Palliative Intent, Discussed with Patient

## 2020-02-22 ENCOUNTER — Ambulatory Visit (INDEPENDENT_AMBULATORY_CARE_PROVIDER_SITE_OTHER): Payer: Medicare Other | Admitting: Internal Medicine

## 2020-02-22 ENCOUNTER — Ambulatory Visit (INDEPENDENT_AMBULATORY_CARE_PROVIDER_SITE_OTHER): Payer: Medicare Other

## 2020-02-22 ENCOUNTER — Encounter: Payer: Self-pay | Admitting: Internal Medicine

## 2020-02-22 VITALS — BP 92/68 | HR 110 | Temp 98.1°F | Wt 168.4 lb

## 2020-02-22 DIAGNOSIS — M25562 Pain in left knee: Secondary | ICD-10-CM

## 2020-02-22 DIAGNOSIS — M1712 Unilateral primary osteoarthritis, left knee: Secondary | ICD-10-CM | POA: Diagnosis not present

## 2020-02-22 NOTE — Progress Notes (Signed)
Established Patient Office Visit     This visit occurred during the SARS-CoV-2 public health emergency.  Safety protocols were in place, including screening questions prior to the visit, additional usage of staff PPE, and extensive cleaning of exam room while observing appropriate contact time as indicated for disinfecting solutions.    CC/Reason for Visit: Left knee pain  HPI: Carl Harrell is a 74 y.o. male who is coming in today for the above mentioned reasons. Past Medical History is significant for: Stage IV, metastatic non-small cell lung cancer under the care of Dr. Earlie Server.  He states that for the past 2 to 3 months he has been having left knee pain.  Pain is over the medial joint line and sometimes extends below the knee.  Ibuprofen seems to be very helpful.  [place] and felt back to his left knee which has caused significant increase in pain.  He is still able to bear weight but with difficulty and is currently using a walker.   Past Medical/Surgical History: Past Medical History:  Diagnosis Date  . ALLERGIC RHINITIS 01/30/2007  . Arthritis    "hands" (05/29/2014)  . Cancer ([place])    lung, prostate  . Constipation   . Coronary artery disease    LHC (05/29/14): pLAD 50-70, pRCA 99 (L>R collats), EF 55% >> PCI: 3.5 x 28 mm Xience Alpine DES to [employer]  . Cough secondary to angiotensin converting enzyme inhibitor (ACE-I) 05/28/2013  . ED (erectile dysfunction)   . GERD 09/26/2007  . HYPERLIPIDEMIA 09/26/2007  . Hypertension   . Lung cancer ([place]) dx'd 03/2018  . PLANTAR FASCIITIS, BILATERAL 07/08/2009  . Pre-diabetes   . Prostate CA ([place]) dx'd 2017 or 2018   xrt  . Unspecified hearing loss 06/16/2009   wears aides both ears    Past Surgical History:  Procedure Laterality Date  . CARDIAC CATHETERIZATION  05/29/2014   Procedure: CORONARY STENT INTERVENTION;  Surgeon: Sinclair Grooms, MD;  Location: [facility] CATH LAB;  Service: Cardiovascular;;  Prox  RCA  . COLONOSCOPY    . CORONARY ANGIOPLASTY WITH STENT PLACEMENT  05/29/2014   "1"  . KNEE ARTHROSCOPY Right ~ 2005  . LEFT HEART CATHETERIZATION WITH CORONARY ANGIOGRAM N/A 05/29/2014   Procedure: LEFT HEART CATHETERIZATION WITH CORONARY ANGIOGRAM;  Surgeon: Sinclair Grooms, MD;  Location: [facility] CATH LAB;  Service: Cardiovascular;  Laterality: N/A;  . THYROIDECTOMY N/A 06/25/2019   Procedure: TOTAL THYROIDECTOMY;  Surgeon: Armandina Gemma, MD;  Location: [facility] ORS;  Service: General;  Laterality: N/A;  . VIDEO BRONCHOSCOPY WITH ENDOBRONCHIAL ULTRASOUND N/A 02/20/2019   Procedure: VIDEO BRONCHOSCOPY WITH ENDOBRONCHIAL ULTRASOUND;  Surgeon: Garner Nash, DO;  Location: [facility] OR;  Service: Thoracic;  Laterality: N/A;    Social History:  reports that he quit smoking about 41 years ago. His smoking use included cigarettes. He has a 3.00 pack-year smoking history. He has never used smokeless tobacco. He reports current alcohol use of about 2.0 standard drinks of alcohol per week. He reports that he does not use drugs.  Allergies: Allergies  Allergen Reactions  . Lisinopril Cough    Family History:  Family History  Problem Relation Age of Onset  . Heart attack Mother   . Heart disease Mother   . Heart attack Father   . Heart disease Father   . COPD Neg Hx        family hx  . Hyperlipidemia Neg Hx  family hx     Current Outpatient Medications:  .  calcium carbonate (TUMS - DOSED IN MG ELEMENTAL CALCIUM) 500 MG chewable tablet, Chew 1 tablet by mouth daily., Disp: , Rfl:  .  cetirizine (ZYRTEC) 10 MG tablet, TAKE 1 TABLET(10 MG) BY MOUTH DAILY, Disp: 30 tablet, Rfl: 11 .  dexamethasone (DECADRON) 4 MG tablet, Please take TWO tablets TWICE a day the day before, the day of, and the day after chemotherapy., Disp: 40 tablet, Rfl: 2 .  ezetimibe (ZETIA) 10 MG tablet, Take 1 tablet (10 mg total) by mouth daily., Disp: 90 tablet, Rfl: 2 .  fluticasone (FLONASE) 50 MCG/ACT nasal spray, SHAKE  LIQUID AND USE 1 SPRAY IN EACH NOSTRIL DAILY, Disp: 48 g, Rfl: 0 .  folic acid (FOLVITE) 1 MG tablet, Take 1 tablet (1 mg total) by mouth daily., Disp: 30 tablet, Rfl: 2 .  levothyroxine (SYNTHROID) 100 MCG tablet, Take 100 mcg by mouth daily., Disp: , Rfl:  .  losartan (COZAAR) 50 MG tablet, TAKE 1 TABLET BY MOUTH DAILY, Disp: 90 tablet, Rfl: 0 .  metoprolol tartrate (LOPRESSOR) 25 MG tablet, TAKE 1 TABLET BY MOUTH TWICE DAILY, Disp: 180 tablet, Rfl: 0 .  Multiple Vitamin (MULTIVITAMIN WITH MINERALS) TABS tablet, Take 1 tablet by mouth daily., Disp: , Rfl:  .  omeprazole (PRILOSEC) 40 MG capsule, Take 1 capsule (40 mg total) by mouth 2 (two) times daily., Disp: 60 capsule, Rfl: 3 .  ondansetron (ZOFRAN ODT) 8 MG disintegrating tablet, Take 1 tablet (8 mg total) by mouth every 8 (eight) hours as needed for nausea or vomiting., Disp: 12 tablet, Rfl: 0 .  oxyCODONE 10 MG TABS, Take 1 tablet (10 mg total) by mouth every 8 (eight) hours as needed for severe pain., Disp: 45 tablet, Rfl: 0 .  prochlorperazine (COMPAZINE) 10 MG tablet, Take 1 tablet (10 mg total) by mouth every 6 (six) hours as needed., Disp: 30 tablet, Rfl: 2 .  simvastatin (ZOCOR) 40 MG tablet, TAKE 1 TABLET(40 MG) BY MOUTH DAILY AT 6 PM (Patient taking differently: Take 40 mg by mouth daily at 6 PM. ), Disp: 90 tablet, Rfl: 3 .  STOOL SOFTENER 100 MG capsule, Take 100 mg by mouth daily as needed., Disp: , Rfl:  .  vitamin B-12 (CYANOCOBALAMIN) 50 MCG tablet, Take 50 mcg by mouth daily., Disp: , Rfl:   Review of Systems:  Constitutional: Denies fever, chills, diaphoresis.Marland Kitchen  HEENT: Denies photophobia, eye pain, redness, hearing loss, ear pain, congestion, sore throat, rhinorrhea, sneezing, mouth sores, trouble swallowing, neck pain, neck stiffness and tinnitus.   Respiratory: Denies SOB, DOE, cough, chest tightness,  and wheezing.   Cardiovascular: Denies chest pain, palpitations and leg swelling.  Gastrointestinal: Denies nausea,  vomiting, abdominal pain, diarrhea, constipation, blood in stool and abdominal distention.  Genitourinary: Denies dysuria, urgency, frequency, hematuria, flank pain and difficulty urinating.  Endocrine: Denies: hot or cold intolerance, sweats, changes in hair or nails, polyuria, polydipsia. Musculoskeletal: Denies myalgias, back pain. Skin: Denies pallor, rash and wound.  Neurological: Denies dizziness, seizures, syncope, weakness, light-headedness, numbness and headaches.  Hematological: Denies adenopathy. Easy bruising, personal or family bleeding history  Psychiatric/Behavioral: Denies suicidal ideation, mood changes, confusion, nervousness, sleep disturbance and agitation    Physical Exam: Vitals:   02/22/20 1357  BP: 92/68  Pulse: (!) 110  Temp: 98.1 F (36.7 C)  TempSrc: Oral  SpO2: 98%  Weight: 168 lb 6.4 oz (76.4 kg)    Body mass index is 26.38 kg/m.  Constitutional: NAD, calm, comfortable, pale, cachectic Eyes: PERRL, lids and conjunctivae normal, wears corrective lenses ENMT: Mucous membranes are moist. Musculoskeletal: Good range of motion to the knee, slight pain to palpation to the medial joint line, no obvious edema. Psychiatric: Normal judgment and insight. Alert and oriented x 3. Normal mood.    Impression and Plan:  Acute pain of left knee -Due to location of pain and good response to NSAIDs, I suspect this might be Pes anserine bursitis. -However because of recent fall and increasing pain since then I will order an x-ray today. -For now have advised continued NSAID use, rest, icing and brace usage. -If no improvement can consider referral to sports medicine/Ortho to see if cortisone injection might be helpful.      Lelon Frohlich, MD [facility]

## 2020-02-25 ENCOUNTER — Telehealth: Payer: Self-pay | Admitting: Medical Oncology

## 2020-02-25 ENCOUNTER — Telehealth: Payer: Self-pay | Admitting: Internal Medicine

## 2020-02-25 ENCOUNTER — Other Ambulatory Visit: Payer: Self-pay | Admitting: Internal Medicine

## 2020-02-25 ENCOUNTER — Telehealth: Payer: Self-pay | Admitting: Family Medicine

## 2020-02-25 NOTE — Telephone Encounter (Signed)
pt requesting his x-ray results  [phone]

## 2020-02-25 NOTE — Telephone Encounter (Signed)
Carl Harrell has not heard from scheduling about his treatment.  Please advise.

## 2020-02-25 NOTE — Telephone Encounter (Signed)
Scheduled appt per 9/30 sch msg - pt is aware of appt date and time and aware of 10/5 and 10/6 being decoupled.

## 2020-02-25 NOTE — Telephone Encounter (Signed)
Please reach out to the scheduler.  I already placed the order as well as the scheduling message and Cassie did too.  Thank you.

## 2020-02-25 NOTE — Telephone Encounter (Signed)
Patient is returning a call, states he did not get a Advertising account executive.

## 2020-02-26 ENCOUNTER — Encounter: Payer: Self-pay | Admitting: Internal Medicine

## 2020-02-26 ENCOUNTER — Inpatient Hospital Stay: Payer: Medicare Other

## 2020-02-26 ENCOUNTER — Inpatient Hospital Stay: Payer: Medicare Other | Attending: Internal Medicine | Admitting: Internal Medicine

## 2020-02-26 ENCOUNTER — Other Ambulatory Visit: Payer: Self-pay

## 2020-02-26 ENCOUNTER — Telehealth: Payer: Self-pay | Admitting: Internal Medicine

## 2020-02-26 VITALS — BP 87/63 | HR 70 | Temp 97.8°F | Resp 20 | Ht 67.0 in | Wt 166.9 lb

## 2020-02-26 DIAGNOSIS — I10 Essential (primary) hypertension: Secondary | ICD-10-CM | POA: Insufficient documentation

## 2020-02-26 DIAGNOSIS — Z85850 Personal history of malignant neoplasm of thyroid: Secondary | ICD-10-CM | POA: Insufficient documentation

## 2020-02-26 DIAGNOSIS — Z5111 Encounter for antineoplastic chemotherapy: Secondary | ICD-10-CM | POA: Diagnosis not present

## 2020-02-26 DIAGNOSIS — C3411 Malignant neoplasm of upper lobe, right bronchus or lung: Secondary | ICD-10-CM | POA: Diagnosis not present

## 2020-02-26 DIAGNOSIS — C3491 Malignant neoplasm of unspecified part of right bronchus or lung: Secondary | ICD-10-CM | POA: Diagnosis not present

## 2020-02-26 DIAGNOSIS — Z923 Personal history of irradiation: Secondary | ICD-10-CM | POA: Insufficient documentation

## 2020-02-26 DIAGNOSIS — E785 Hyperlipidemia, unspecified: Secondary | ICD-10-CM | POA: Insufficient documentation

## 2020-02-26 DIAGNOSIS — R110 Nausea: Secondary | ICD-10-CM | POA: Insufficient documentation

## 2020-02-26 DIAGNOSIS — Z9221 Personal history of antineoplastic chemotherapy: Secondary | ICD-10-CM | POA: Insufficient documentation

## 2020-02-26 DIAGNOSIS — Z8546 Personal history of malignant neoplasm of prostate: Secondary | ICD-10-CM | POA: Insufficient documentation

## 2020-02-26 DIAGNOSIS — C73 Malignant neoplasm of thyroid gland: Secondary | ICD-10-CM

## 2020-02-26 LAB — CBC WITH DIFFERENTIAL (CANCER CENTER ONLY)
Abs Immature Granulocytes: 0.16 10*3/uL — ABNORMAL HIGH (ref 0.00–0.07)
Basophils Absolute: 0 10*3/uL (ref 0.0–0.1)
Basophils Relative: 0 %
Eosinophils Absolute: 0.1 10*3/uL (ref 0.0–0.5)
Eosinophils Relative: 0 %
HCT: 28.7 % — ABNORMAL LOW (ref 39.0–52.0)
Hemoglobin: 8.7 g/dL — ABNORMAL LOW (ref 13.0–17.0)
Immature Granulocytes: 1 %
Lymphocytes Relative: 7 %
Lymphs Abs: 0.9 10*3/uL (ref 0.7–4.0)
MCH: 25.1 pg — ABNORMAL LOW (ref 26.0–34.0)
MCHC: 30.3 g/dL (ref 30.0–36.0)
MCV: 82.9 fL (ref 80.0–100.0)
Monocytes Absolute: 1.4 10*3/uL — ABNORMAL HIGH (ref 0.1–1.0)
Monocytes Relative: 10 %
Neutro Abs: 11.9 10*3/uL — ABNORMAL HIGH (ref 1.7–7.7)
Neutrophils Relative %: 82 %
Platelet Count: 516 10*3/uL — ABNORMAL HIGH (ref 150–400)
RBC: 3.46 MIL/uL — ABNORMAL LOW (ref 4.22–5.81)
RDW: 20.7 % — ABNORMAL HIGH (ref 11.5–15.5)
WBC Count: 14.4 10*3/uL — ABNORMAL HIGH (ref 4.0–10.5)
nRBC: 0 % (ref 0.0–0.2)

## 2020-02-26 LAB — CMP (CANCER CENTER ONLY)
ALT: 41 U/L (ref 0–44)
AST: 68 U/L — ABNORMAL HIGH (ref 15–41)
Albumin: 2.3 g/dL — ABNORMAL LOW (ref 3.5–5.0)
Alkaline Phosphatase: 503 U/L — ABNORMAL HIGH (ref 38–126)
Anion gap: 9 (ref 5–15)
BUN: 20 mg/dL (ref 8–23)
CO2: 26 mmol/L (ref 22–32)
Calcium: 9.1 mg/dL (ref 8.9–10.3)
Chloride: 103 mmol/L (ref 98–111)
Creatinine: 1.32 mg/dL — ABNORMAL HIGH (ref 0.61–1.24)
GFR, Estimated: 52 mL/min — ABNORMAL LOW (ref >60)
Glucose, Bld: 115 mg/dL — ABNORMAL HIGH (ref 70–99)
Potassium: 3.8 mmol/L (ref 3.5–5.1)
Sodium: 138 mmol/L (ref 135–145)
Total Bilirubin: 0.7 mg/dL (ref 0.3–1.2)
Total Protein: 6.9 g/dL (ref 6.5–8.1)

## 2020-02-26 NOTE — Telephone Encounter (Signed)
Sent via result note

## 2020-02-26 NOTE — Progress Notes (Signed)
.  The following biosimilar Udenyca ([id number]) has been selected for use in this patient per insurance  Henreitta Leber, PharmD

## 2020-02-26 NOTE — Progress Notes (Signed)
Carl Harrell Telephone:([phone]   Fax:([phone]  OFFICE PROGRESS NOTE  Carl Ruddy, MD [place] [address]  DIAGNOSIS: 1) Metastatic non-small cell lung cancer, likely adenocarcinoma initially diagnosed as stage IIIA (T2b, N2, M0). Presented with right upper lobe lung mass in addition to right hilar and mediastinal lymphadenopathy diagnosed in September 2020. Showed disease progression with metastatic disease with Malignancy recurrence in the right suprahilar regionand Two hypermetabolic left eccentric mesenteric masses along the small bowel mesentery, along with 3 new hypermetabolic lesions in the liver, compatible with malignancy in June 2021.  2) history of prostate adenocarcinoma status post radiotherapy under the care of Dr. Tammi Klippel. 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  Molecular Biomarkers: [id number] Everolimus, Temsirolimus Yes 4.9% [id number] None Yes 4.7% [id number] Yes 0.2% [id number] None No 5.2% [id number] None No 5.0% [id number] None (VUS) No (VUS) 4.4% [id number] (VUS) No (VUS) 3.5% MSI-HighNOT DETECTED  PRIOR THERAPY: 1) Concurrent chemoradiation with weekly carboplatin for AUC of 2 and paclitaxel 45 mg/M2. First dose March 05, 2019. Status post 7 cycles. Last dose was given April 16, 2019 with partial response. 2)Total thyroidectomy on February 1, 26468for follicular neoplasm of the thyroid under the care of Dr. GHarlow Asa3) Consolidation immunotherapy with immunotherapy with Imfinzi 1500 mg IV every 4 weeks. Last dose 10/11/19.  4) Systemic chemotherapy with Carboplatin for an AUC of 5, Alimta 500 mg/m2, and Keytruda 200 mg IV every 3 weeks. First dose expected on7/22/2021.   Status post 3 cycles.  Discontinued secondary to disease progression.  CURRENT THERAPY:  Systemic  chemotherapy with docetaxel 75 mg/M2 and Cyramza 10 mg/KG every 3 weeks with Neulasta support.  First dose 03/04/2020.  INTERVAL HISTORY: Carl [id number].[id number]. male returns to the clinic today for follow-up visit.  The patient continues to complain of increasing fatigue and weakness.  He took his medication early this morning without food and he felt much worse.  He lives alone with his wife at home who has Sjogren's disease.  He does not have a lot of family support around except his [place] who [place] in [place]  [place] connection with her for several years.  The patient is here today for evaluation before starting the first cycle of second line chemotherapy with docetaxel and Cyramza.  He has no current chest pain, shortness of breath, cough or hemoptysis.  He continues to complain of intermittent abdominal pain but no significant nausea, vomiting, diarrhea or constipation.  He has no headache or visual changes.   MEDICAL HISTORY: Past Medical History:  Diagnosis Date  . ALLERGIC RHINITIS 01/30/2007  . Arthritis    "hands" (05/29/2014)  . [facility]    lung, prostate  . Constipation   . Coronary artery disease    LHC (05/29/14): pLAD 50-70, pRCA 99 (L>R collats), EF 55% >> PCI: 3.5 x 28 mm Xience Alpine DES to [employer] . Cough secondary to angiotensin converting enzyme inhibitor (ACE-I) 05/28/2013  . ED (erectile dysfunction)   . GERD 09/26/2007  . HYPERLIPIDEMIA 09/26/2007  . Hypertension   . [place]  . PLANTAR FASCIITIS, BILATERAL 07/08/2009  . Pre-diabetes   . Prostate CA ([facility] dx[place] 2017 or 2018   xrt  . Unspecified hearing loss 06/16/2009   wears aides both ears  ALLERGIES:  is allergic to lisinopril.  MEDICATIONS:  Current Outpatient Medications  Medication Sig Dispense Refill  . calcium carbonate (TUMS - DOSED IN MG ELEMENTAL CALCIUM) 500 MG chewable tablet Chew 1 tablet by mouth daily.    .  cetirizine (ZYRTEC) 10 MG tablet TAKE 1 TABLET(10 MG) BY MOUTH DAILY 30 tablet 11  . dexamethasone (DECADRON) 4 MG tablet Please take TWO tablets TWICE a day the day before, the day of, and the day after chemotherapy. 40 tablet 2  . ezetimibe (ZETIA) 10 MG tablet Take 1 tablet (10 mg total) by mouth daily. 90 tablet 2  . fluticasone (FLONASE) 50 MCG/ACT nasal spray SHAKE LIQUID AND USE 1 SPRAY IN EACH NOSTRIL DAILY 48 g 0  . folic acid (FOLVITE) 1 MG tablet Take 1 tablet (1 mg total) by mouth daily. 30 tablet 2  . levothyroxine (SYNTHROID) 100 MCG tablet Take 100 mcg by mouth daily.    Marland Kitchen losartan (COZAAR) 50 MG tablet TAKE 1 TABLET BY MOUTH DAILY 90 tablet 0  . metoprolol tartrate (LOPRESSOR) 25 MG tablet TAKE 1 TABLET BY MOUTH TWICE DAILY 180 tablet 0  . Multiple Vitamin (MULTIVITAMIN WITH MINERALS) TABS tablet Take 1 tablet by mouth daily.    Marland Kitchen omeprazole (PRILOSEC) 40 MG capsule Take 1 capsule (40 mg total) by mouth 2 (two) times daily. 60 capsule 3  . ondansetron (ZOFRAN ODT) 8 MG disintegrating tablet Take 1 tablet (8 mg total) by mouth every 8 (eight) hours as needed for nausea or vomiting. 12 tablet 0  . oxyCODONE 10 MG TABS Take 1 tablet (10 mg total) by mouth every 8 (eight) hours as needed for severe pain. 45 tablet 0  . prochlorperazine (COMPAZINE) 10 MG tablet Take 1 tablet (10 mg total) by mouth every 6 (six) hours as needed. 30 tablet 2  . simvastatin (ZOCOR) 40 MG tablet TAKE 1 TABLET(40 MG) BY MOUTH DAILY AT 6 PM (Patient taking differently: Take 40 mg by mouth daily at 6 PM. ) 90 tablet 3  . STOOL SOFTENER 100 MG capsule Take 100 mg by mouth daily as needed.    . vitamin B-12 (CYANOCOBALAMIN) 50 MCG tablet Take 50 mcg by mouth daily.     No current facility-administered medications for this visit.    SURGICAL HISTORY:  Past Surgical History:  Procedure Laterality Date  . CARDIAC CATHETERIZATION  05/29/2014   Procedure: CORONARY STENT INTERVENTION;  Surgeon: Sinclair Grooms,  MD;  Location: [facility] CATH LAB;  Service: Cardiovascular;;  Prox RCA  . COLONOSCOPY    . CORONARY ANGIOPLASTY WITH STENT PLACEMENT  05/29/2014   "1"  . KNEE ARTHROSCOPY Right ~ 2005  . LEFT HEART CATHETERIZATION WITH CORONARY ANGIOGRAM N/A 05/29/2014   Procedure: LEFT HEART CATHETERIZATION WITH CORONARY ANGIOGRAM;  Surgeon: Sinclair Grooms, MD;  Location: [facility] C[facility] LAB;  Service: Cardiovascular;  Laterality: N/A;  . THYROIDECTOMY N/A 06/25/2019   Procedure: TOTAL THYROIDECTOMY;  Surgeon: Armandina Gemma, MD;  Location: [facility] ORS;  Service: General;  Laterality: N/A;  . VIDEO BRONCHOSCOPY WITH ENDOBRONCHIAL ULTRASOUND N/A 02/20/2019   Procedure: VIDEO BRONCHOSCOPY WITH ENDOBRONCHIAL ULTRASOUND;  Surgeon: Garner Nash, DO;  Location: [facility];  Service: Thoracic;  Laterality: N/A;    REVIEW OF SYSTEMS:  Constitutional: positive for anorexia, fatigue and weight loss Eyes: negative Ears, nose, mouth, throat, and face: negative Respiratory: negative Cardiovascular: negative Gastrointestinal: positive for abdominal pain Genitourinary:negative Integument/breast: negative Hematologic/lymphatic: negative Musculoskeletal:negative Neurological: negative Behavioral/Psych: negative Endocrine: negative Allergic/Immunologic: negative  PHYSICAL EXAMINATION: General appearance: alert, cooperative, fatigued and no distress Head: Normocephalic, without obvious abnormality, atraumatic Neck: no adenopathy, no JVD, supple, symmetrical, trachea midline and thyroid not enlarged, symmetric, no tenderness/mass/nodules Lymph nodes: Cervical, supraclavicular, and axillary nodes normal. Resp: clear to auscultation bilaterally Back: symmetric, no curvature. ROM normal. No CVA tenderness. Cardio: regular rate and rhythm, S1, S2 normal, no murmur, click, rub or gallop GI: soft, non-tender; bowel sounds normal; no masses,  no organomegaly Extremities: extremities normal, atraumatic, no cyanosis or edema Neurologic: Alert and  oriented X 3, normal strength and tone. Normal symmetric reflexes. Normal coordination and gait  ECOG PERFORMANCE STATUS: 2 - Symptomatic, <50% confined to bed  There were no vitals taken for this visit.  LABORATORY DATA: Lab Results  Component Value Date   WBC 6.4 02/13/2020   HGB 9.2 (L) 02/13/2020   HCT 30.0 (L) 02/13/2020   MCV 84.7 02/13/2020   PLT 366 02/13/2020      Chemistry      Component Value Date/Time   NA 139 02/13/2020 0806   NA 141 08/05/2016 0826   K 3.9 02/13/2020 0806   CL 104 02/13/2020 0806   CO2 25 02/13/2020 0806   BUN 11 02/13/2020 0806   BUN 14 08/05/2016 0826   CREATININE 1.00 02/13/2020 0806      Component Value Date/Time   CALCIUM 8.9 02/13/2020 0806   ALKPHOS 192 (H) 02/13/2020 0806   AST 32 02/13/2020 0806   ALT 22 02/13/2020 0806   BILITOT 0.6 02/13/2020 0806       RADIOGRAPHIC STUDIES: CT Chest W Contrast  Result Date: 02/12/2020 CLINICAL DATA:  Non-small-cell lung cancer.  Restaging. EXAM: CT CHEST, ABDOMEN, AND PELVIS WITH CONTRAST TECHNIQUE: Multidetector CT imaging of the chest, abdomen and pelvis was performed following the standard protocol during bolus administration of intravenous contrast. CONTRAST:  145m OMNIPAQUE IOHEXOL 300 MG/ML  SOLN COMPARISON:  PET-CT 11/08/2019. Diagnostic chest abdomen pelvis CT 11/05/2019. FINDINGS: CT CHEST FINDINGS Cardiovascular: The heart size is normal. No substantial pericardial effusion. Coronary artery calcification is evident. Atherosclerotic calcification is noted in the wall of the thoracic aorta. Mediastinum/Nodes: No mediastinal lymphadenopathy. There is no hilar lymphadenopathy. The esophagus has normal imaging features. There is no axillary lymphadenopathy. Lungs/Pleura: Right suprahilar lesion has progressed in the interval measuring 4.0 x 3.9 cm today compared to 2.9 x 2.5 cm on the 11/05/2019 exam. This lesion extends into the cranial aspect of the right hilum. Volume loss and adjacent  architectural distortion/scarring is compatible with treatment change. No new suspicious pulmonary nodule or mass. No pulmonary edema. No pleural effusion. Musculoskeletal: No worrisome lytic or sclerotic osseous abnormality. CT ABDOMEN PELVIS FINDINGS Hepatobiliary: Interval development of innumerable liver metastases. Dominant lesion in the inferior right liver measures 5.1 x 5.1 cm. Another index lesion near the junction of segment IV and segment V measures 4.4 x 3.8 cm on 59/2. a third index lesion in segment II measures 2.6 x 2.2 cm. Tiny calcified gallstone evident. No intrahepatic or extrahepatic biliary dilation. Pancreas: No focal mass lesion. No dilatation of the main duct. No intraparenchymal cyst. No peripancreatic edema. Spleen: No splenomegaly. No focal mass lesion. Adrenals/Urinary Tract: 3.0 x 2.8 cm left adrenal mass is new in the interval. 1.8 cm right adrenal nodule is new since prior imaging. Right kidney unremarkable. Posterior left renal cyst is stable No evidence for hydroureter. The urinary bladder appears normal for the degree of distention. Stomach/Bowel: Stomach is unremarkable. No gastric wall thickening. No evidence of  outlet obstruction. Duodenum is normally positioned as is the ligament of Treitz. No small bowel wall thickening. No small bowel dilatation. The terminal ileum is normal. The appendix is normal. No gross colonic mass. No colonic wall thickening. Diverticular changes are noted in the left colon without evidence of diverticulitis. Vascular/Lymphatic: There is abdominal aortic atherosclerosis without aneurysm. 1.4 cm short axis portal caval lymph node is new in the interval and may have central necrosis. No para-aortic lymphadenopathy. No pelvic sidewall lymphadenopathy. Reproductive: Fiducial markers noted in the prostate gland. Other: Left mesenteric lesion measures 7.2 x 4.1 cm today, increased from 6.1 x 4.3 cm (remeasured) previously. New mesenteric soft tissue nodules  are visible on images 73 and 77, consistent with new sites of mesenteric involvement. Musculoskeletal: No worrisome lytic or sclerotic osseous abnormality. IMPRESSION: 1. Interval progression of disease. 2. The right suprahilar lesion has enlarged in the interval since prior imaging, now measuring up to 4 cm. 3. Innumerable new liver metastases evident. Patient was noted to have 3 hypermetabolic liver lesions on the PET-CT of 11/05/2019. 4. New left adrenal mass with new left adrenal nodule, consistent with metastatic involvement. 5. Progression of dominant left mesenteric necrotic metastatic lesion with interval development of additional small mesenteric soft tissue nodules consistent with new sites of mesenteric involvement. 6. Interval development of a 1.4 cm short axis portal caval lymph node consistent with metastatic disease. 7. Cholelithiasis. 8. Left colonic diverticulosis without diverticulitis. 9. Aortic Atherosclerosis (ICD10-I70.0). Electronically Signed   By: Misty Stanley M.D.   On: 02/12/2020 12:46   CT Abdomen Pelvis W Contrast  Result Date: 02/12/2020 CLINICAL DATA:  Non-small-cell lung cancer.  Restaging. EXAM: CT CHEST, ABDOMEN, AND PELVIS WITH CONTRAST TECHNIQUE: Multidetector CT imaging of the chest, abdomen and pelvis was performed following the standard protocol during bolus administration of intravenous contrast. CONTRAST:  116m OMNIPAQUE IOHEXOL 300 MG/ML  SOLN COMPARISON:  PET-CT 11/08/2019. Diagnostic chest abdomen pelvis CT 11/05/2019. FINDINGS: CT CHEST FINDINGS Cardiovascular: The heart size is normal. No substantial pericardial effusion. Coronary artery calcification is evident. Atherosclerotic calcification is noted in the wall of the thoracic aorta. Mediastinum/Nodes: No mediastinal lymphadenopathy. There is no hilar lymphadenopathy. The esophagus has normal imaging features. There is no axillary lymphadenopathy. Lungs/Pleura: Right suprahilar lesion has progressed in the  interval measuring 4.0 x 3.9 cm today compared to 2.9 x 2.5 cm on the 11/05/2019 exam. This lesion extends into the cranial aspect of the right hilum. Volume loss and adjacent architectural distortion/scarring is compatible with treatment change. No new suspicious pulmonary nodule or mass. No pulmonary edema. No pleural effusion. Musculoskeletal: No worrisome lytic or sclerotic osseous abnormality. CT ABDOMEN PELVIS FINDINGS Hepatobiliary: Interval development of innumerable liver metastases. Dominant lesion in the inferior right liver measures 5.1 x 5.1 cm. Another index lesion near the junction of segment IV and segment V measures 4.4 x 3.8 cm on 59/2. a third index lesion in segment II measures 2.6 x 2.2 cm. Tiny calcified gallstone evident. No intrahepatic or extrahepatic biliary dilation. Pancreas: No focal mass lesion. No dilatation of the main duct. No intraparenchymal cyst. No peripancreatic edema. Spleen: No splenomegaly. No focal mass lesion. Adrenals/Urinary Tract: 3.0 x 2.8 cm left adrenal mass is new in the interval. 1.8 cm right adrenal nodule is new since prior imaging. Right kidney unremarkable. Posterior left renal cyst is stable No evidence for hydroureter. The urinary bladder appears normal for the degree of distention. Stomach/Bowel: Stomach is unremarkable. No gastric wall thickening. No evidence of outlet obstruction.  Duodenum is normally positioned as is the ligament of Treitz. No small bowel wall thickening. No small bowel dilatation. The terminal ileum is normal. The appendix is normal. No gross colonic mass. No colonic wall thickening. Diverticular changes are noted in the left colon without evidence of diverticulitis. Vascular/Lymphatic: There is abdominal aortic atherosclerosis without aneurysm. 1.4 cm short axis portal caval lymph node is new in the interval and may have central necrosis. No para-aortic lymphadenopathy. No pelvic sidewall lymphadenopathy. Reproductive: Fiducial markers  noted in the prostate gland. Other: Left mesenteric lesion measures 7.2 x 4.1 cm today, increased from 6.1 x 4.3 cm (remeasured) previously. New mesenteric soft tissue nodules are visible on images 73 and 77, consistent with new sites of mesenteric involvement. Musculoskeletal: No worrisome lytic or sclerotic osseous abnormality. IMPRESSION: 1. Interval progression of disease. 2. The right suprahilar lesion has enlarged in the interval since prior imaging, now measuring up to 4 cm. 3. Innumerable new liver metastases evident. Patient was noted to have 3 hypermetabolic liver lesions on the PET-CT of 11/05/2019. 4. New left adrenal mass with new left adrenal nodule, consistent with metastatic involvement. 5. Progression of dominant left mesenteric necrotic metastatic lesion with interval development of additional small mesenteric soft tissue nodules consistent with new sites of mesenteric involvement. 6. Interval development of a 1.4 cm short axis portal caval lymph node consistent with metastatic disease. 7. Cholelithiasis. 8. Left colonic diverticulosis without diverticulitis. 9. Aortic Atherosclerosis (ICD10-I70.0). Electronically Signed   By: Misty Stanley M.D.   On: 02/12/2020 12:46   DG Knee Complete 4 Views Left  Result Date: 02/24/2020 CLINICAL DATA:  Left knee pain after fall several days ago. EXAM: LEFT KNEE - COMPLETE 4+ VIEW COMPARISON:  None. FINDINGS: No evidence of fracture, dislocation, or joint effusion. Moderate narrowing of medial joint space is noted. Soft tissues are unremarkable. IMPRESSION: Moderate degenerative joint disease is noted medially. No acute abnormality seen in the left knee. Electronically Signed   By: Marijo Conception M.D.   On: 02/24/2020 11:02    ASSESSMENT AND PLAN: This is a very pleasant 74 years old white male with recently diagnosed a stage IIIa (T2b, N2, M0) non-small cell lung cancer favoring adenocarcinoma presented with right upper lobe lung mass in addition to  right hilar and mediastinal lymphadenopathy diagnosed in September 2020. The patient underwent a course of concurrent chemoradiation with weekly carboplatin and paclitaxel status post 7 cycles.  He tolerated the treatment well except for fatigue and dry cough. He has partial response to this treatment.   The patient is currently undergoing consolidation treatment with immunotherapy with Imfinzi 1500 mg IV every 4 weeks status post 6 cycles.  The patient has been tolerating this treatment well but unfortunately he continues to complain of abdominal pain and recent imaging studies including a PET scan showed malignancy recurrence in the right suprahilar region with 2 hypermetabolic left eccentric mesenteric masses along the small bowel mesentery and 3 new hypermetabolic lesions in the liver compatible with malignancy.  The patient continues to have intermittent abdominal pain. His stool was positive for Hemoccult.  He was evaluated by gastroenterology and no malignancy was identified. The patient underwent ultrasound-guided core biopsy of the metastatic liver lesion and it was consistent with metastatic adenocarcinoma. The patient was treated with 3 cycles of systemic chemotherapy with carboplatin, Alimta and Keytruda discontinued secondary to disease progression.  I had a lengthy discussion with him last visit about his current condition and treatment options.  The patient was  giving the option of palliative care and hospice referral versus consideration of second line treatment with docetaxel 75 mg/M2 and Cyramza 10 mg/KG every 3 weeks with Neulasta support.  After discussion with his family the patient decided to proceed with the treatment and he was supposed to start cycle number one of his treatment today.  The patient is feeling more tired and fatigued and has generalized weakness.  He is also hypotensive secondary to lack of appetite and poor p.o. intake. I had a lengthy discussion again with the  patient about his current condition and consideration of palliative care and hospice versus proceeding with the systemic chemotherapy. He still interested in treatment but would like to delay the start of his treatment by 1 week until he feels a little bit stronger. I will arrange for his treatment to start next week unless he decided to go with the hospice care. For the pain management, he will continue on oxycodone as needed. For the nausea, he is currently on Zofran. For the thyroid cancer, he underwent surgical resection and he is followed by endocrinology. The patient will come back for follow-up visit in 2 weeks for evaluation and management of any adverse effect of his treatment. He was advised to call immediately if he has any concerning symptoms in the interval. All questions were answered. The patient knows to call the clinic with any problems, questions or concerns. We can certainly see the patient much sooner if necessary.  Disclaimer: This note was dictated with voice recognition software. Similar sounding words can inadvertently be transcribed and may not be corrected upon review.

## 2020-02-26 NOTE — Telephone Encounter (Signed)
Scheduled appointments per 10/5 los. Gave patient updated calendar.

## 2020-02-26 NOTE — Telephone Encounter (Signed)
Pt is requesting for his x-ray results of his knee, not able to give pt results. Please advise

## 2020-02-27 ENCOUNTER — Other Ambulatory Visit: Payer: Medicare Other

## 2020-02-27 ENCOUNTER — Ambulatory Visit: Payer: Medicare Other

## 2020-02-28 ENCOUNTER — Telehealth: Payer: Self-pay | Admitting: Internal Medicine

## 2020-02-28 ENCOUNTER — Other Ambulatory Visit: Payer: Self-pay | Admitting: Physician Assistant

## 2020-02-28 DIAGNOSIS — C3491 Malignant neoplasm of unspecified part of right bronchus or lung: Secondary | ICD-10-CM

## 2020-02-28 NOTE — Telephone Encounter (Signed)
Per 10/7 msg left message to cancel appointment.

## 2020-02-29 ENCOUNTER — Ambulatory Visit: Payer: Medicare Other

## 2020-02-29 MED FILL — Dexamethasone Sodium Phosphate Inj 100 MG/10ML: INTRAMUSCULAR | Qty: 1 | Status: AC

## 2020-03-03 ENCOUNTER — Inpatient Hospital Stay: Payer: Medicare Other

## 2020-03-03 ENCOUNTER — Other Ambulatory Visit: Payer: Self-pay

## 2020-03-03 DIAGNOSIS — Z9221 Personal history of antineoplastic chemotherapy: Secondary | ICD-10-CM | POA: Diagnosis not present

## 2020-03-03 DIAGNOSIS — C3411 Malignant neoplasm of upper lobe, right bronchus or lung: Secondary | ICD-10-CM | POA: Diagnosis not present

## 2020-03-03 DIAGNOSIS — E785 Hyperlipidemia, unspecified: Secondary | ICD-10-CM | POA: Diagnosis not present

## 2020-03-03 DIAGNOSIS — I10 Essential (primary) hypertension: Secondary | ICD-10-CM | POA: Diagnosis not present

## 2020-03-03 DIAGNOSIS — R110 Nausea: Secondary | ICD-10-CM | POA: Diagnosis not present

## 2020-03-03 DIAGNOSIS — Z85850 Personal history of malignant neoplasm of thyroid: Secondary | ICD-10-CM | POA: Diagnosis not present

## 2020-03-03 DIAGNOSIS — Z923 Personal history of irradiation: Secondary | ICD-10-CM | POA: Diagnosis not present

## 2020-03-03 DIAGNOSIS — C3491 Malignant neoplasm of unspecified part of right bronchus or lung: Secondary | ICD-10-CM

## 2020-03-03 LAB — CBC WITH DIFFERENTIAL (CANCER CENTER ONLY)
Abs Immature Granulocytes: 0.09 10*3/uL — ABNORMAL HIGH (ref 0.00–0.07)
Basophils Absolute: 0 10*3/uL (ref 0.0–0.1)
Basophils Relative: 0 %
Eosinophils Absolute: 0.1 10*3/uL (ref 0.0–0.5)
Eosinophils Relative: 0 %
HCT: 28.2 % — ABNORMAL LOW (ref 39.0–52.0)
Hemoglobin: 8.7 g/dL — ABNORMAL LOW (ref 13.0–17.0)
Immature Granulocytes: 1 %
Lymphocytes Relative: 6 %
Lymphs Abs: 0.8 10*3/uL (ref 0.7–4.0)
MCH: 25.1 pg — ABNORMAL LOW (ref 26.0–34.0)
MCHC: 30.9 g/dL (ref 30.0–36.0)
MCV: 81.5 fL (ref 80.0–100.0)
Monocytes Absolute: 1.2 10*3/uL — ABNORMAL HIGH (ref 0.1–1.0)
Monocytes Relative: 9 %
Neutro Abs: 11.5 10*3/uL — ABNORMAL HIGH (ref 1.7–7.7)
Neutrophils Relative %: 84 %
Platelet Count: 414 10*3/uL — ABNORMAL HIGH (ref 150–400)
RBC: 3.46 MIL/uL — ABNORMAL LOW (ref 4.22–5.81)
RDW: 20.4 % — ABNORMAL HIGH (ref 11.5–15.5)
WBC Count: 13.7 10*3/uL — ABNORMAL HIGH (ref 4.0–10.5)
nRBC: 0 % (ref 0.0–0.2)

## 2020-03-03 LAB — CMP (CANCER CENTER ONLY)
ALT: 42 U/L (ref 0–44)
AST: 76 U/L — ABNORMAL HIGH (ref 15–41)
Albumin: 2 g/dL — ABNORMAL LOW (ref 3.5–5.0)
Alkaline Phosphatase: 527 U/L — ABNORMAL HIGH (ref 38–126)
Anion gap: 14 (ref 5–15)
BUN: 28 mg/dL — ABNORMAL HIGH (ref 8–23)
CO2: 23 mmol/L (ref 22–32)
Calcium: 9 mg/dL (ref 8.9–10.3)
Chloride: 101 mmol/L (ref 98–111)
Creatinine: 1.35 mg/dL — ABNORMAL HIGH (ref 0.61–1.24)
GFR, Estimated: 51 mL/min — ABNORMAL LOW (ref >60)
Glucose, Bld: 133 mg/dL — ABNORMAL HIGH (ref 70–99)
Potassium: 3.5 mmol/L (ref 3.5–5.1)
Sodium: 138 mmol/L (ref 135–145)
Total Bilirubin: 1.3 mg/dL — ABNORMAL HIGH (ref 0.3–1.2)
Total Protein: 6.7 g/dL (ref 6.5–8.1)

## 2020-03-03 NOTE — Progress Notes (Signed)
Patient arrived for chemo today but refused treatment. Per wife, patient has not been eating or taking medications other than his pain meds. Patient stated he is "spacing meds out" and is only taking them as he feels he needs them. Cassie Heiligoepter, PA was made aware of patient's refusal for treatment. Per Fritz Pickerel, PA's request, a scheduling message was sent for lab, MD visit, and infusion next week.

## 2020-03-05 ENCOUNTER — Ambulatory Visit: Payer: Medicare Other | Admitting: Internal Medicine

## 2020-03-05 ENCOUNTER — Other Ambulatory Visit: Payer: Medicare Other

## 2020-03-05 ENCOUNTER — Ambulatory Visit: Payer: Medicare Other

## 2020-03-06 ENCOUNTER — Telehealth: Payer: Self-pay | Admitting: Physician Assistant

## 2020-03-06 ENCOUNTER — Other Ambulatory Visit: Payer: Self-pay | Admitting: Cardiology

## 2020-03-06 NOTE — Telephone Encounter (Signed)
Called pt per Secure chat with Cassie H. And Rn melanie to add appt in for 10/15 - unable to reach pt . Left message for patient with appt date and time

## 2020-03-07 ENCOUNTER — Ambulatory Visit: Payer: Medicare Other

## 2020-03-07 ENCOUNTER — Other Ambulatory Visit: Payer: Medicare Other

## 2020-03-07 ENCOUNTER — Other Ambulatory Visit: Payer: Self-pay | Admitting: Physician Assistant

## 2020-03-07 ENCOUNTER — Ambulatory Visit: Payer: Medicare Other | Admitting: Physician Assistant

## 2020-03-07 DIAGNOSIS — C3491 Malignant neoplasm of unspecified part of right bronchus or lung: Secondary | ICD-10-CM

## 2020-03-07 NOTE — Telephone Encounter (Signed)
Called pt's medication losartan into pt's pharmacy [facility]. Pharmacist verbalized understanding.

## 2020-03-10 ENCOUNTER — Telehealth: Payer: Self-pay | Admitting: Medical Oncology

## 2020-03-10 ENCOUNTER — Other Ambulatory Visit: Payer: Self-pay | Admitting: Internal Medicine

## 2020-03-10 DIAGNOSIS — G893 Neoplasm related pain (acute) (chronic): Secondary | ICD-10-CM

## 2020-03-10 DIAGNOSIS — C3491 Malignant neoplasm of unspecified part of right bronchus or lung: Secondary | ICD-10-CM

## 2020-03-10 MED ORDER — OXYCODONE HCL 10 MG PO TABS
10.0000 mg | ORAL_TABLET | Freq: Three times a day (TID) | ORAL | 0 refills | Status: AC | PRN
Start: 2020-03-10 — End: ?

## 2020-03-10 NOTE — Telephone Encounter (Signed)
Urgent Oxycodone 10 mg refill requested:   Please consider changing the frequency to q 4 prn instead of q 8 prn? He has 4 tablets left.  [facility] Pt requests DNR and facility MD  will sign it.

## 2020-03-11 ENCOUNTER — Other Ambulatory Visit: Payer: Self-pay | Admitting: Physician Assistant

## 2020-03-13 ENCOUNTER — Other Ambulatory Visit: Payer: Medicare Other

## 2020-03-13 ENCOUNTER — Ambulatory Visit: Payer: Medicare Other | Admitting: Internal Medicine

## 2020-03-18 ENCOUNTER — Telehealth: Payer: Self-pay

## 2020-03-19 ENCOUNTER — Other Ambulatory Visit: Payer: Medicare Other

## 2020-03-19 ENCOUNTER — Ambulatory Visit: Payer: Medicare Other | Admitting: Internal Medicine

## 2020-03-19 ENCOUNTER — Ambulatory Visit: Payer: Medicare Other

## 2020-03-21 ENCOUNTER — Ambulatory Visit: Payer: Medicare Other

## 2020-03-24 NOTE — Telephone Encounter (Signed)
Fax received indicating pt passed 04/08/2020

## 2020-03-24 DEATH — deceased

## 2020-04-09 ENCOUNTER — Ambulatory Visit: Payer: Medicare Other

## 2020-04-09 ENCOUNTER — Ambulatory Visit: Payer: Medicare Other | Admitting: Internal Medicine

## 2020-04-09 ENCOUNTER — Other Ambulatory Visit: Payer: Medicare Other

## 2020-04-09 ENCOUNTER — Encounter: Payer: Medicare Other | Admitting: Nutrition

## 2020-04-11 ENCOUNTER — Ambulatory Visit: Payer: Medicare Other

## 2020-06-17 ENCOUNTER — Ambulatory Visit: Payer: Medicare Other | Admitting: Internal Medicine

## 2021-01-28 IMAGING — CT NM PET TUM IMG INITIAL (PI) SKULL BASE T - THIGH
1 of 9 series · 1 of 25 positions shown · non-contrast
Comparison: None.

CLINICAL DATA: Initial treatment strategy for pulmonary mass.

EXAM:
NUCLEAR MEDICINE PET SKULL BASE TO THIGH
TECHNIQUE: 10.9 mCi F-18 FDG was injected intravenously. Full-ring PET imaging
was performed from the skull base to thigh after the radiotracer. CT
data was obtained and used for attenuation correction and anatomic
localization.
Fasting blood glucose: 108 mg/dl

[Series 4: ct sk_thigh 5.0 b31f · axial · 5.0mm · 0.98mm/px · 1 of 236 slices shown]
[im 236/236  brain]
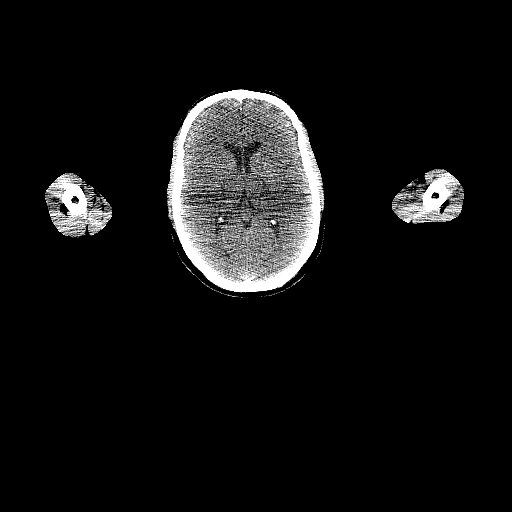

[1 of 25 positions shown; findings below may reference images not displayed]

FINDINGS: Mediastinal blood pool activity: SUV max

Liver activity: SUV max NA

NECK: Extension of the LEFT lobe of thyroid gland to substernal
location. There is peripheral calcification of this nodular
extension. This nodular extension is hypermetabolic SUV max equal
8.0 (image 53) and measures 2.9 x 2.[address] cm[address]

Incidental CT findings: none

CHEST: Hypermetabolic RIGHT upper lobe mass measures 3.5 x 2.5 cm
with intense metabolic activity (SUV max equal 18.2)

Bulky RIGHT hilar lymph node measures 3.3 cm with SUV max equal
14.8. Hypermetabolic RIGHT lower paratracheal node measures 14 mm
with SUV max equal 19.2.

No hypermetabolic supraclavicular nodes.

No contralateral hypermetabolic lymph nodes.

No LEFT lung pulmonary nodules.

Incidental CT findings: Coronary artery calcification and aortic
atherosclerotic calcification.

ABDOMEN/PELVIS: No abnormal hypermetabolic activity within the
liver, pancreas, adrenal glands, or spleen. No hypermetabolic lymph
nodes in the abdomen or pelvis.

Incidental CT findings: Low-density lesion in the subcapsular RIGHT
hepatic lobe without associated metabolic activity. Thin capsular
calcification this level (image 100/4).Favor prior trauma or
infection.

Prostate enlarged to 61 mm in diameter. Atherosclerotic
calcification of the aorta. Small gallstone.

SKELETON: No focal hypermetabolic activity to suggest skeletal
metastasis.

Incidental CT findings: none
IMPRESSION: 1. Intensely hypermetabolic RIGHT upper lobe mass.
2. RIGHT hilar hypermetabolic metastatic adenopathy. RIGHT
paratracheal hypermetabolic nodal metastasis. Staging by FDG PET
imaging T2a N2 m0
3. Hypermetabolic nodule extending from the inferior aspect of the
LEFT lobe of thyroid gland with differential including thyroid
carcinoma versus thyroid adenoma. Metastatic disease is not favored.
Consider thyroid ultrasound and potential biopsy. This lesion may be
difficult to ultrasound as extends into the thoracic inlet.
